# Patient Record
Sex: Female | Born: 1945 | Race: White | Hispanic: No | Smoking: Never smoker
Health system: Southern US, Community
[De-identification: ages and names within clinical notes are randomized; demographics above are authoritative.]

## PROBLEM LIST (undated history)

## (undated) ENCOUNTER — Emergency Department (HOSPITAL_COMMUNITY): Admission: EM | Payer: Self-pay | Source: Home / Self Care

## (undated) DIAGNOSIS — E119 Type 2 diabetes mellitus without complications: Secondary | ICD-10-CM

## (undated) DIAGNOSIS — G4730 Sleep apnea, unspecified: Secondary | ICD-10-CM

## (undated) DIAGNOSIS — F601 Schizoid personality disorder: Secondary | ICD-10-CM

## (undated) DIAGNOSIS — Z86010 Personal history of colonic polyps: Principal | ICD-10-CM

## (undated) DIAGNOSIS — Z8719 Personal history of other diseases of the digestive system: Secondary | ICD-10-CM

## (undated) DIAGNOSIS — R194 Change in bowel habit: Secondary | ICD-10-CM

## (undated) DIAGNOSIS — E785 Hyperlipidemia, unspecified: Secondary | ICD-10-CM

## (undated) DIAGNOSIS — I10 Essential (primary) hypertension: Secondary | ICD-10-CM

## (undated) DIAGNOSIS — M5412 Radiculopathy, cervical region: Secondary | ICD-10-CM

## (undated) DIAGNOSIS — F329 Major depressive disorder, single episode, unspecified: Secondary | ICD-10-CM

## (undated) DIAGNOSIS — C801 Malignant (primary) neoplasm, unspecified: Secondary | ICD-10-CM

## (undated) DIAGNOSIS — F32A Depression, unspecified: Secondary | ICD-10-CM

## (undated) DIAGNOSIS — N63 Unspecified lump in unspecified breast: Secondary | ICD-10-CM

## (undated) DIAGNOSIS — C4490 Unspecified malignant neoplasm of skin, unspecified: Secondary | ICD-10-CM

## (undated) HISTORY — DX: Sleep apnea, unspecified: G47.30

## (undated) HISTORY — DX: Unspecified malignant neoplasm of skin, unspecified: C44.90

## (undated) HISTORY — PX: BREAST SURGERY: SHX581

## (undated) HISTORY — DX: Change in bowel habit: R19.4

## (undated) HISTORY — DX: Unspecified lump in unspecified breast: N63.0

## (undated) HISTORY — DX: Schizoid personality disorder: F60.1

## (undated) HISTORY — DX: Major depressive disorder, single episode, unspecified: F32.9

## (undated) HISTORY — DX: Radiculopathy, cervical region: M54.12

## (undated) HISTORY — DX: Type 2 diabetes mellitus without complications: E11.9

## (undated) HISTORY — DX: Personal history of colonic polyps: Z86.010

## (undated) HISTORY — PX: ABDOMINAL HYSTERECTOMY: SHX81

## (undated) HISTORY — DX: Depression, unspecified: F32.A

## (undated) HISTORY — PX: TUBAL LIGATION: SHX77

## (undated) HISTORY — DX: Hyperlipidemia, unspecified: E78.5

## (undated) HISTORY — DX: Personal history of other diseases of the digestive system: Z87.19

## (undated) HISTORY — DX: Malignant (primary) neoplasm, unspecified: C80.1

## (undated) HISTORY — DX: Essential (primary) hypertension: I10

---

## 1997-12-04 ENCOUNTER — Other Ambulatory Visit: Admission: RE | Admit: 1997-12-04 | Discharge: 1997-12-04 | Payer: Self-pay | Admitting: Family Medicine

## 1998-07-16 ENCOUNTER — Ambulatory Visit (HOSPITAL_COMMUNITY): Admission: RE | Admit: 1998-07-16 | Discharge: 1998-07-16 | Payer: Self-pay | Admitting: Family Medicine

## 2000-08-25 ENCOUNTER — Ambulatory Visit (HOSPITAL_COMMUNITY): Admission: RE | Admit: 2000-08-25 | Discharge: 2000-08-25 | Payer: Self-pay | Admitting: Family Medicine

## 2001-05-10 ENCOUNTER — Inpatient Hospital Stay (HOSPITAL_COMMUNITY): Admission: EM | Admit: 2001-05-10 | Discharge: 2001-05-12 | Payer: Self-pay | Admitting: Emergency Medicine

## 2001-05-11 ENCOUNTER — Encounter: Payer: Self-pay | Admitting: Family Medicine

## 2001-05-15 ENCOUNTER — Encounter: Admission: RE | Admit: 2001-05-15 | Discharge: 2001-05-15 | Payer: Self-pay | Admitting: Family Medicine

## 2002-02-20 ENCOUNTER — Emergency Department (HOSPITAL_COMMUNITY): Admission: EM | Admit: 2002-02-20 | Discharge: 2002-02-20 | Payer: Self-pay | Admitting: Emergency Medicine

## 2002-02-20 ENCOUNTER — Encounter: Payer: Self-pay | Admitting: Emergency Medicine

## 2002-07-31 ENCOUNTER — Other Ambulatory Visit: Admission: RE | Admit: 2002-07-31 | Discharge: 2002-07-31 | Payer: Self-pay | Admitting: Family Medicine

## 2005-03-01 DIAGNOSIS — C801 Malignant (primary) neoplasm, unspecified: Secondary | ICD-10-CM

## 2005-03-01 HISTORY — DX: Malignant (primary) neoplasm, unspecified: C80.1

## 2005-11-15 ENCOUNTER — Ambulatory Visit: Payer: Self-pay | Admitting: Internal Medicine

## 2005-11-30 ENCOUNTER — Other Ambulatory Visit: Admission: RE | Admit: 2005-11-30 | Discharge: 2005-11-30 | Payer: Self-pay | Admitting: Family Medicine

## 2005-12-03 ENCOUNTER — Ambulatory Visit: Payer: Self-pay | Admitting: Internal Medicine

## 2006-01-12 ENCOUNTER — Ambulatory Visit: Admission: RE | Admit: 2006-01-12 | Discharge: 2006-01-12 | Payer: Self-pay | Admitting: Gynecologic Oncology

## 2006-02-01 ENCOUNTER — Encounter (INDEPENDENT_AMBULATORY_CARE_PROVIDER_SITE_OTHER): Payer: Self-pay | Admitting: *Deleted

## 2006-02-01 ENCOUNTER — Observation Stay (HOSPITAL_COMMUNITY): Admission: RE | Admit: 2006-02-01 | Discharge: 2006-02-03 | Payer: Self-pay | Admitting: Obstetrics & Gynecology

## 2006-02-16 ENCOUNTER — Ambulatory Visit: Admission: RE | Admit: 2006-02-16 | Discharge: 2006-02-16 | Payer: Self-pay | Admitting: Gynecologic Oncology

## 2006-06-15 ENCOUNTER — Ambulatory Visit: Admission: RE | Admit: 2006-06-15 | Discharge: 2006-06-15 | Payer: Self-pay | Admitting: Gynecologic Oncology

## 2006-06-15 ENCOUNTER — Encounter (INDEPENDENT_AMBULATORY_CARE_PROVIDER_SITE_OTHER): Payer: Self-pay | Admitting: *Deleted

## 2006-06-15 ENCOUNTER — Other Ambulatory Visit: Admission: RE | Admit: 2006-06-15 | Discharge: 2006-06-15 | Payer: Self-pay | Admitting: Gynecologic Oncology

## 2007-08-16 ENCOUNTER — Ambulatory Visit: Payer: Self-pay | Admitting: Cardiology

## 2007-11-28 ENCOUNTER — Ambulatory Visit: Admission: RE | Admit: 2007-11-28 | Discharge: 2007-11-28 | Payer: Self-pay | Admitting: Gynecologic Oncology

## 2007-11-28 ENCOUNTER — Encounter: Payer: Self-pay | Admitting: Gynecologic Oncology

## 2007-11-28 ENCOUNTER — Other Ambulatory Visit: Admission: RE | Admit: 2007-11-28 | Discharge: 2007-11-28 | Payer: Self-pay | Admitting: Gynecologic Oncology

## 2008-05-25 DIAGNOSIS — E1169 Type 2 diabetes mellitus with other specified complication: Secondary | ICD-10-CM | POA: Insufficient documentation

## 2008-05-25 DIAGNOSIS — G4733 Obstructive sleep apnea (adult) (pediatric): Secondary | ICD-10-CM | POA: Insufficient documentation

## 2008-05-25 DIAGNOSIS — I10 Essential (primary) hypertension: Secondary | ICD-10-CM | POA: Insufficient documentation

## 2008-05-25 DIAGNOSIS — C549 Malignant neoplasm of corpus uteri, unspecified: Secondary | ICD-10-CM | POA: Insufficient documentation

## 2008-05-25 DIAGNOSIS — E785 Hyperlipidemia, unspecified: Secondary | ICD-10-CM

## 2008-05-25 DIAGNOSIS — G4730 Sleep apnea, unspecified: Secondary | ICD-10-CM

## 2008-08-03 ENCOUNTER — Encounter: Admission: RE | Admit: 2008-08-03 | Discharge: 2008-08-03 | Payer: Self-pay | Admitting: Neurology

## 2008-12-22 ENCOUNTER — Encounter: Admission: RE | Admit: 2008-12-22 | Discharge: 2008-12-22 | Payer: Self-pay | Admitting: Neurology

## 2009-10-21 ENCOUNTER — Telehealth (INDEPENDENT_AMBULATORY_CARE_PROVIDER_SITE_OTHER): Payer: Self-pay | Admitting: *Deleted

## 2010-03-22 ENCOUNTER — Encounter: Payer: Self-pay | Admitting: Neurology

## 2010-03-23 ENCOUNTER — Encounter: Payer: Self-pay | Admitting: Neurology

## 2010-04-02 NOTE — Progress Notes (Signed)
   DDS request recieved sent to [facility]. Alexandra Hunt  October 21, 2009 12:52 PM

## 2010-07-14 NOTE — Assessment & Plan Note (Signed)
[facility]                            CARDIOLOGY OFFICE NOTE   Alexandra Hunt, Alexandra Hunt                        MRN:          [id number]  DATE:08/16/2007                            DOB:          05/11/1945    PRIMARY CARE PHYSICIAN:  Alexandra Pierini, NP, [facility].   REASON FOR PRESENTATION:  Evaluate the patient with shortness of breath,  chest discomfort, and multiple cardiovascular risk factors.   HISTORY OF PRESENT ILLNESS:  The patient is 65 years old.  She has had  chest discomfort in the past with a stress test in 1993 and again in  2000.  These were exercise treadmill tests and did not demonstrate any  high-grade obstructive coronary artery disease.  However, since I last  saw her about 9 years ago, she has been diagnosed with hypertension,  diabetes, and hyperlipidemia.  She has hemoglobin A1c last checked at  7.5.  She is on treatment for her dyslipidemia.   She states she has been getting more short of breath.  This has been  happening slowly over the last year or so.  It has been dyspnea walking  up a hill.  She is not describing resting shortness of breath and is not  having any PND or orthopnea.  She has not been having any palpitations,  presyncope, or syncope.  She has been getting chest discomfort.  This  happens at rest.  It happens with emotional stress.  It is a shooting  discomfort, but it may linger.  It is on her left side and radiating up  to her left arm.  She may take a Ativan and it might go away.  It maybe  5/10 in intensity.  She is not sure whether this is similar to the  previous discomfort she has.  She states she cannot really bring this  on, except with the emotional stress.  She has not had associated  nausea, vomiting, or diaphoresis with this.  She states she is under  lots of stress taking care of 3 grandchildren at home.  Her daughter is  missing.   PAST MEDICAL HISTORY:  1.  Diabetes mellitus x7 years.  2. Hypertension x2 years.  3. Hyperlipidemia x2 years.  4. Sleep apnea (she was not prescribed CPAP).  5. Endometrial cancer, 2007.   PAST SURGICAL HISTORY:  1. Hysterectomy/BSO/lymphadenectomy.  2. Breast biopsy x2.   ALLERGIES:  1. PENICILLIN.  2. SULFA.  3. CODEINE.   MEDICATIONS:  1. Metformin 1000 mg b.i.d.  2. Sertraline 75 mg daily.  3. Maxzide 37.5/25 daily.  4. Fenofibrate 34 mg daily.  5. Aspirin 81 mg daily.  6. Loratadine 10 mg daily.  7. Xanax.  8. Ativan 1 mg nightly.  9. Vicodin.  10.Humalog.  11.Lantus.  12.Lisinopril.   SOCIAL HISTORY:  The patient is a Engineer, civil (consulting).  She is married.  She has 4  children.  She is currently caring for her daughter's 38-year-old twins  and 53-year-old child.  She does not smoke cigarettes.  She does not  drink alcohol.  FAMILY HISTORY:  Contributory for father dying of myocardial function at  age 22.   REVIEW OF SYSTEMS:  As stated in the HPI, and positive for dizziness,  some joint pain, and lower extremity swelling.  Negative for all other  systems.   PHYSICAL EXAMINATION:  GENERAL:  The patient is in no acute distress.  VITAL SIGNS:  Blood pressure 170/80, rate 84 and regular, body mass  index 34, and weight 200 pounds.  HEENT:  Eyelids are unremarkable.  Pupils equal, round, and reactive to  light.  Fundi not visualized.  Oral mucosa unremarkable.  NECK:  No jugular distention at 45 degrees, carotid upstroke brisk and  symmetric.  No bruits, no thyromegaly.  LYMPHATICS:  No cervical, axillary, or inguinal adenopathy.  LUNGS:  Clear to auscultation bilaterally.  BACK:  No costovertebral angle tenderness.  CHEST:  Unremarkable.  HEART:  PMI not displaced or sustained.  S1 and S2 within normal limits.  No S3, no S4, no clicks, no rubs, no murmurs.  ABDOMEN:  Obese, positive bowel sounds normal in frequency and pitch, no  bruits, no rebound, no guarding, no midline pulsatile mass, no   hepatomegaly, no splenomegaly.  SKIN:  No rashes, no nodules.  EXTREMITIES:  2+ pulses throughout, no edema, no cyanosis, no clubbing.  NEUROLOGIC:  Oriented to person, place, and time.  Cranial nerves II-XII  grossly intact, motor grossly intact.   EKG, sinus rhythm, rate 84, axis within normal limits, intervals within  normal limits, no acute ST-wave changes.   ASSESSMENT/PLAN:  1. Chest pain. The patient has chest discomfort and dyspnea.  The      pretest probability of obstructive coronary disease is at least      moderate given her risk factors.  Therefore, I am going to screen      her with an exercise perfusion study.  If she cannot walk on a      treadmill, this can be converted to an adenosine Cardiolite.  I      will also check a BNP level.  If both the stress test and the BNP      level come back normal, then the etiology of the dyspnea is much      less likely to be cardiac.  2. Hypertension.  Blood pressure is elevated.  I am going to take the      liberty of starting lisinopril 10 mg daily.  She was on this in the      past and did not have any problems with it.  She will get a BMET in      2 weeks.  3. Dyslipidemia, per Alexandra Hunt with a goal LDL of less than 100      and HDL greater than 50.  I will revise this based on the presence      or absence of coronary artery disease.  4. Diabetes mellitus.  She is having this followed closely.  5. Sleep apnea.  The patient should inquire as to whether she had this      severely enough that CPAP would be warranted.  Management of sleep      apnea can lead to improved blood pressure control.  6. Obesity.  The patient will understand the need to lose weight with      diet and exercise.  7. Followup.  I will see her again in about 3-4 months to follow up      her multiple risk factors and dyspnea.  Alexandra Rotunda, MD, [facility]  Electronically Signed    JH/MedQ  DD: 08/16/2007  DT: 08/17/2007  Job #: [id number]   cc:   Alexandra Hunt, [place].P.
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
## 2010-07-17 NOTE — Discharge Summary (Signed)
NAMEWANDRA, Alexandra Hunt                 ACCOUNT NO.:  [id number]   MEDICAL RECORD NO.:  [id number]          PATIENT TYPE:  INP   LOCATION:  1608                         FACILITY:  [facility]   PHYSICIAN:  Freddy Finner, M.D.   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   ADDITIONAL MEDICAL DIAGNOSES:  1. Diabetes.  2. Depression.  3. Hypercholesterolemia.   OPERATIVE PROCEDURE:  Total laparoscopic hysterectomy, pelvic lymph node  dissection by Dr. Ronita Hipps with my assistance.   INTRAOPERATIVE AND POSTOPERATIVE COMPLICATIONS:  None.   DISPOSITION:  The patient was in satisfactory improved condition at the  time of her discharge on the second postoperative day.  She was  instructed to have progressively increasing physical activity but no  vaginal entry.  No heavy lifting.  She is to report heavy vaginal  bleeding, severe pain, or fever.  She is to resume all of her  preoperative medications which are outlined in the consultation note of  Dr. Duard Brady dated January 12, 2006.  She was given Dilaudid 2 mg tablets  #60 to be taken 1-2 q.4-6h. as needed for postoperative pain.  This can  be taken in conjunction with ibuprofen.  She is to follow up in my  office in approximately 2 weeks.  She also will have followup as  outlined per Dr. Calton Dach instructions.   Details of the present illness, past history, family history, review of  systems and physical exam were recorded in my admission note and/or in  Dr. Denman George consultation note.  Her admission findings were primarily  remarkable for histologic diagnosis of endometrioid carcinoma made  during hysteroscopy D&C for an episode of postmenopausal bleeding.   HOSPITAL COURSE:  The patient was admitted on that morning of surgery.  She was  treated perioperatively with IV antibiotic and with serial  compression hose for her lower extremities, the above-described  operative procedure was accomplished on the morning of surgery.  Her  postoperative course was uncomplicated.  Her postoperative hemoglobin  was 10.8 with admission hemoglobin of 13.1.  Postoperative CMET was  essentially normal as was the admission data.  Admission A1c was 10.9.  Admission urinalysis was normal.  The patient's postoperative course  progressed well.  She remained afebrile throughout her hospital stay.  By the morning of the second postoperative day, she was having adequate  bowel and bladder function.  She was tolerating her diabetic diet.  She  was discharged home with disposition as noted above.      Freddy Finner, M.D.  Electronically Signed     WRN/MEDQ  D:  05/13/2006  T:  05/14/2006  Job:  [id number]

## 2010-07-17 NOTE — Consult Note (Signed)
Alexandra Hunt, Alexandra Hunt                 ACCOUNT NO.:  [id number]   MEDICAL RECORD NO.:  [id number]          PATIENT TYPE:  OUT   LOCATION:  GYN                          [facility]ACILITY:  [facility]   PHYSICIAN:  Alexandra A. Duard Brady, MD    DATE OF BIRTH:  1945-05-29   DATE OF CONSULTATION:  01/12/2006  DATE OF DISCHARGE:                                 CONSULTATION   REFERRING PHYSICIAN:  Dr. Varney Hunt.   The patient is seen today in consultation at the request of Alexandra Hunt.   Alexandra Hunt is a 65 year old, gravida 3, para 4, whose last period was  about  4-5 months ago.  She states that she has some occasional spotting  for the past 6 months, and she has had what appears to be two periods,  and then started having some __________  .  She was seen by her primary  physician, Alexandra Hunt, nurse practitioner at [facility] in October 2007.  At that time, she had a Pap smear that  revealed atypical glandular cells.  Her visit with them was November 30, 2005.  She was promptly referred to Alexandra Hunt and was seen by Alexandra Hunt on  October 25th.  At that time, she had a 0.5-cm endometrial stripe  thickness with a 9-mm intermural fibroid.  Post saline hystero-sonogram  revealed a 6-mm polypoid mass seen within the endometrial cavity.  She  subsequently went a hysteroscopy D&C.  Final pathology from her D&C was  consistent with an endometrial carcinoma, though no grade was provided.  KI67 and P53 were performed that was negative for a serous carcinoma.  It was most consistent with an endometrioid adenocarcinoma, ER/PR  positive.  It is for this reason that she is referred to [place] today.   She states since her D&C, she has had decreased bleeding though she does  have occasional spotting.  She denies any change in about bladder  habits.  She denies any chest pain.  She does occasionally have some  shortness of breath with activity.  She has shortness of breath going up  a flight of stairs.   PAST  MEDICAL HISTORY:  1. Diabetes.  She is on insulin and oral hypoglycemics.  2. Depression.  3. Hypercholesterolemia.   PAST SURGICAL HISTORY:  1. D&C.  2. Tubal ligation.  3. Breast biopsy of the left breast in 1986, which was benign.  4. Cryotherapy in 1986.  5. She has had four spontaneous vaginal deliveries.   MEDICATIONS:  1. Zoloft 50 mg daily.  2. TriCor 145 mg daily.  3. Insulin 50 units of Lantus q.h.s.  4. NovoLog sliding scale.  5. Metformin 1000 mg twice daily.  6. Avandia 4 mg daily.  7. Hydrochlorothiazide 12.5 mg daily.  8. Multivitamin.  9. Baby aspirin #2 daily.  10.Ativan p.r.n.  11.Darvocet p.r.n.  12.Xanax p.r.n.   ALLERGIES:  1. SULFA WHICH CAUSES NAUSEA AND HIVES.  2. PENICILLIN WHICH CAUSES HIVES.  3. CODEINE WHICH CAUSES NAUSEA.  4. IVP DYE MAKES HER HOT.  5. SHE IS ALLERGIC TO LATEX.  SOCIAL HISTORY:  She denies using tobacco or alcohol.  She is married.  She works as a Engineer, civil (consulting).  Her husband is an Personnel officer.   FAMILY HISTORY:  Her mother had diabetes, coronary disease.  Father died  of a myocardial infarction at the age of 31.  She has two paternal  grandparents who died of MIs.  Maternal grandmother with diabetes.  Paternal grandfather died of old age.   HEALTH MAINTENANCE:  Her colonoscopy was negative in October 2007.  She  had a mammogram in 2007, that was negative.  Her last A1c was 8.6.   PHYSICAL EXAMINATION:  VITAL SIGNS:  Height 5 feet 4 inches, weight 178  pounds, pulse 80, respirations 20.  GENERAL:  A well-nourished, well developed, female in no acute distress.  NECK:  Supple.  There is no lymphadenopathy, no thyromegaly.  LUNGS:  Clear to auscultation bilaterally.  CARDIOVASCULAR:  Regular rate and rhythm.  ABDOMEN:  Soft, nontender, nondistended.  There are no palpable masses  or hepatosplenomegaly.  Groins are negative for adenopathy.  EXTREMITIES:  There is no edema.  PELVIC:  External genitalia is within normal limits.  The  vagina is  atrophic.  The cervix is multiparous.  There is a physiologic discharge.  No visible lesions.  Bimanual examination, the corpus of normal size,  shape and consistency.  There are no adnexal masses.  Rectal confirms.   ASSESSMENT:  A 65 year old with endometrial carcinoma.  The grade was  not described by pathologist, but appears to be endometrioid.  There are  no atypical features.   I did discuss with her surgery including hysterectomy, BSO and  appropriate staging.  I wish that I had a grade so I could discuss with  her more percentages but she is amenable to surgery.  She would like to  proceed with laparoscopy if at all possible and we discussed the date of  December 4 with Dr. Kyla Hunt and Alexandra Hunt.  She is amenable to this.   She would like to be out of work from December 3 for 8 weeks after her  surgery and has brought paperwork to that effect.  I did discuss that we  would attempt it laparoscopically, consisting of a total laparoscopic  hysterectomy, BSO and appropriate staging.  If it is not able to be  performed laparoscopically, she knows that she may have a vertical  midline incision and laparotomy.  Her questions regarding this were  elicited and answered to her satisfaction.  She knows that the surgery  will be with Dr. Kyla Hunt and has asked that I asked him to call her.  We  will also follow up on the grading. We thought she would like Alexandra Hunt to call her with the grade of her tumor for her own  edification.  Risks of surgery including bleeding, infection, injury to  surrounding organs were discussed.  She knows that she is at high risk  for wound complications secondary to her poorly controlled diabetes and  we would encourage her to see her physician for optimal diabetes  management prior to surgery.  In addition, I think that we would need to  ensure that there are no significant perioperative risks and that she should undergo a perioperative risk  stratification.  We will contact her  primary physicians, Alexandra Hunt, nurse practitioner at [facility] for diabetic recommendations and  perioperative diabetes control as well as cardiac risk stratification.  She also has seen Dr. Antoine Poche  at [facility] cardiology.      Alexandra A. Duard Brady, MD  Electronically Signed     PAG/MEDQ  D:  01/12/2006  T:  01/12/2006  Job:  [id number]   cc:   Alexandra Floor, NP  [facility]   Rollene Rotunda, MD, [facility]  [address]  [address]  [place]  [place] [address]   W. Varney Hunt, M.D.  Fax: [phone]   Alexandra Hunt, R.N.  [address]  [place], [place] [address]

## 2010-07-17 NOTE — Discharge Summary (Signed)
[place]. [facility]  [name], Alexandra Hunt Visit Number: [id number] MRN: [id number]          Service Type: MED Location: [phone] Attending Physician:  McDiarmid, Leighton Roach. Dictated by:   Asencion Partridge Neva Seat, M.D. Admit Date:  05/10/2001 Discharge Date: 05/12/2001   CC:         Gweneth Dimitri, M.D.   Discharge Summary  ADMISSION DIAGNOSES: 1. Viral illness. 2. Hypertension. 3. Diabetes mellitus, type 2, poor control. 4. Gastroesophageal reflux disease. 5. Multiple social issues.  DISCHARGE DIAGNOSES: 1. Influenza A infection. 2. Diabetes mellitus, type 2, poor control secondary to noncompliance. 3. Hypertension. 4. Gastroesophageal reflux disease. 5. Social stressors.  ADMISSION HISTORY AND PHYSICAL:  The patient is a 65 year old female with history of diabetes mellitus and hypertension developed fever, shaking chills, and myalgias three days prior to admission.  She has continued to work.  She presented to her primary physician on the day of admission and was admitted secondary to history of poor compliance, often noted to have elevated CBGs and poor social support at that time.  The patient has CBG of 363 and 4+ urine ketones at the office.  The patient was admitted to [facility] where she was placed on normal saline bolus x2 L and then maintenance IV fluid and also started on Tamiflu 75 mg p.o. b.i.d. and restarted on her home medications including Actos 15 mg p.o. q.h.s. and Amaryl 4 mg p.o. q.a.m. A chest x-ray was obtained which showed some slight peribronchial thickening otherwise no acute disease.  Blood cultures were negative throughout the hospitalization.  Influenza titer was positive for Influenza A.  CBC, white blood cells 5.6, hemoglobin 13.1, platelets 222, BMP was within normal limits with exception of glucose of 375.  HOSPITAL COURSE: #1 - INFLUENZA:  The patient was diagnosed with Influenza A and  was treated symptomatology with IV fluids.  Tamiflu was started, however, as the patient was a few days into the illness, we were already unsure as to the effectiveness of this medication during the hospitalization, but it was started for possible symptomatic control.  The patient was tolerating p.o. prior to discharge and symptomatically had improved.  #2 - DIABETES MELLITUS:  The patient was noted to be under poor glycemic control secondary to noncompliance and a history of only checking her blood sugars once a week.  Hemoglobin A1c was obtained at 13.3.  Her regimen was changed.  Amaryl was increased to the maximum dose of 8 mg p.o. q.d., continued Actos.  She was on an insulin sliding scale in the hospital. Diabetic coordinator was consulted.  The patient was arranged to follow up with the outpatient diabetes educator at [facility] for one-to-one therapy to address the patients unique lifestyle shift work.  Information on this counseling was to be sent to the patient for scheduling.  No further changes to the regimen as the patient was to follow up with primary care medical doctor in approximately two weeks and may adjust pharmacologic regimen for diabetes at that time.  #3 - HYPERTENSION:  Remained stable throughout hospitalization on Prinivil. Discharged to home on home dose.  #4 - GASTROESOPHAGEAL REFLUX DISEASE:  She denied being on medicines when she presented to the hospital.  She was given Pepcid while in the hospital for prevention of gastroesophageal reflux.  She denied any symptoms.  She was discharged to home on no pharmacologic regimen.  #5 - SOCIAL STRESSORS:  The patient states that she has many stressors in her life currently.  She has multiple family members who still live at home and feels that she is taking care of many others and has a hard time taking care of herself.  She also states that this is some of the reason why she has a hard time managing her  diabetes, keeping that under control.  I had a lengthy discussion with the patient about the stressors in her life and counseled possible management of these and realizing that the first step in helping others was to help herself first.  The patient expressed understanding of this and does realize that she does need to pass some responsibility onto others and plans on attempting that and does reply that this would be a good time to start that as currently is ill and she states that her husband will be off of work for the next few days and will be able to assist her and help take care of her while she gets over this current sickness.  The patient was discharged to home on May 12, 2001, in stable condition.  DISCHARGE MEDICATIONS: 1. Amaryl 4 mg 2 tablets p.o. q.d. with meal. 2. Actos 15 mg tablet 1 tablet p.o. q.h.s. 3. Albuterol inhaler 2 puffs p.o. q.4h. p.r.n. wheezing and shortness of    breath as the patient had some wheezing associated with her insulin in    the hospital. 4. The patient was to continue her Flonase, multivitamin, aspirin, and    Serzone at her prehospital admission doses.  ACTIVITY:  No restrictions.  A note was given to the patient to excuse her from work while she recuperates from this illness.  DIET:  The patient was able to adhere to a diabetic diet as prescribed by the American Diabetes Association.  WOUND CARE:  Not applicable.  SPECIAL INSTRUCTIONS:  The patient is to expect to receive a telephone call from the [facility] to arrange one-on-one diabetes education at [facility].  This is secondary to the patients third shift work and special circumstances.  Also, schedule an appointment with the patients primary physician, Dr. Gweneth Dimitri, on Friday, March 28, at 2:45 p.m. Dictated by:   Asencion Partridge Neva Seat, M.D. Attending Physician:  McDiarmid, Tawanna Cooler D. DD:  05/12/01 TD:  05/15/01 Job: [id number]

## 2010-07-17 NOTE — H&P (Signed)
[place]. [facility]  [name], Alexandra Hunt Visit Number: [id number] MRN: [id number]          Service Type: MED Location: [phone] Attending Physician:  McDiarmid, Leighton Roach. Dictated by:   Mont Dutton, M.D. Admit Date:  05/10/2001                           History and Physical  DATE OF BIRTH:  [birth year]  SERVICE:  [facility].  PRIMARY CARE PHYSICIAN:  [facility].  CHIEF COMPLAINT:  "Feel lousy."  HISTORY OF PRESENT ILLNESS:  Patient is a pleasant 65 year old female with a history of diabetes mellitus and hypertension, who developed fever, shaking chills and myalgias three days ago.  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  She reports severe body aches "all over," worse now than earlier.  Some nausea reported but no vomiting except for phlegm.  Positive productive cough, intermittent, with chest congestion intermittently.  Her blood sugars are greater than 300 over the past few days and she is urinating two to three times every hour.  She was evaluated by Dr. Gweneth Dimitri at [facility] today, who is her primary physician, who felt that due to patients poor compliance, elevated CBGs and poor social support, that she would not be safe at home. Patient was too tired to go fill a prescription for Tamiflu.  She was noted to have CBGs of 363 and positive urine ketones today.  Patient only checks her CBG q.wk. at work and was started on Actos two weeks ago.  PAST MEDICAL HISTORY: 1. Diabetes mellitus, type 2, x4 years. 2. Hypertension. 3. GERD, intermittent. 4. Situational stress. 5. DUB. 6. Questionable OSA. 7. Rhinitis.  PAST SURGICAL HISTORY: 1. Bilateral tubal ligation in 1978. 2. Lumpectomy of left breast in 1988. 3. Dysplasia of cervix which proceeded to cryosurgery in 1988. 4. Endocervical  polypectomy.  MEDICATIONS: 1. Amaryl 4 mg p.o. q.d. 2. Flonase two puffs b.i.d. 3. Actos 50 mg p.o. q.d. 4. Prinivil 2.5 mg p.o. q.d. 5. MVI q.d. 6. ASA q.d. 7. Serzone 150 mg p.o. b.i.d.  ALLERGIES:  CODEINE, SULFA, PENICILLIN and IV CONTRAST.  SOCIAL HISTORY:  Patient lives with her husband.  There is positive marital dysfunction; patient does not feel supported at home (per Dr. Corliss Blacker). Denies alcohol, tobacco or illicit drug use.  FAMILY HISTORY:  Positive for diabetes mellitus.  REVIEW OF SYSTEMS:  Positive fevers, sweating, weight loss, anorexia, myalgias and nausea.  Denies any chest pain, palpitations or dyspnea.  Denies diarrhea, constipation or melena.  Denies joint pain.  PHYSICAL EXAMINATION:  VITALS:  Temperature 101.7, heart rate 106, respiratory rate 18, blood pressure 113/61.  GENERAL:  Pleasant, A&O x3, slightly obese and ill-appearing but in no acute distress.  HEENT:  TMs clear.  PERRL.  EOMI.  Nares nonedematous.  Oropharynx:  Slightly dry mucosa and poor dentition.  NECK:  No lymphadenopathy.  CHEST:  Clear to auscultation bilaterally.  No wheezes.  CARDIOVASCULAR:  Tachycardia but regular rhythm.  No murmur.  ABDOMEN:  Increased bowel sounds but nondistended, nontender.  No guarding or rebound.  No hepatosplenomegaly.  EXTREMITIES:  Positive for diaphoresis and clammy but brisk capillary refill.  LABORATORY AND ACCESSORY DATA:  Labs are pending.  White blood cell count at [facility]  6.7.  Urine with 4+ ketones with specific gravity of 1.020.  These labs were from [facility].  ASSESSMENT AND PLAN:  [age]-year-old white female with a viral illness, mild dehydration, hyperglycemia and poorly controlled diabetes mellitus.  1. Infectious disease:  Likely viral origin or flu but will check blood    culture, complete blood count and chest x-ray to rule out occult infection.    Hold off  on antibiotic treatment for now.  Will give Tamiflu.  Likely    little benefit at slightly greater than at 48 hours but may help    symptomatic complaints. 2. Hypertension:  Continue Prinivil. 3. Fluids, electrolytes, and nutrition:  We will continue aggressive    intravenous fluids and then above maintenance for hyperglycemia.  Check    electrolytes and replace as appropriate. 4. Diabetes mellitus, poorly controlled.  Her current regimen is likely not to    lower her A1c to target range.  If creatinine is within normal limits, will    advise Glucophage in addition to ______.  May need daily Lantus    eventually.  Also will need diabetic teaching.  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 5. Gastroesophageal reflux disease:  Pepcid intravenously for now. 6. Social:  Address as outpatient, patient not cared for at home and this    admission is partly to assist in proper care while here.  Will follow up    with patients primary physician, Dr. Corliss Blacker. Dictated by:   Mont Dutton, M.D. Attending Physician:  McDiarmid, Tawanna Cooler D. DD:  05/10/01 TD:  05/11/01 Job: [id number]

## 2010-07-17 NOTE — H&P (Signed)
Alexandra Hunt, Alexandra Hunt                 ACCOUNT NO.:  [id number]   MEDICAL RECORD NO.:  [id number]          PATIENT TYPE:  INP   LOCATION:  NA                           FACILITY:  [facility]   PHYSICIAN:  Freddy Finner, M.D.   DATE OF BIRTH:  05/19/1945   DATE OF ADMISSION:  02/01/2006  DATE OF DISCHARGE:                              HISTORY & PHYSICAL   ADMITTING DIAGNOSIS:  Endometrioid carcinoma of the endometrium, FIGO  grade 1.   The patient is a 65 year old, who was seen in consultation by Dr. Cleda Mccreedy, on January 12, 2006, after an outpatient hysteroscopy D&C  revealed a diagnosis of endometrial cancer.  Dr. Denman George note is  complete including present illness, past history, family history, and  physical examination.  The patient was in my office on November 29, at  which time her physical findings were unchanged from Dr. Denman George note.  Since the details of that note are complete, I will not repeat all of  those things since I know the consultation record will be part of her  permanent record.   PHYSICAL EXAMINATION:  To my examination on the 29th, HEENT:  Grossly  within normal limits.  Thyroid gland is not palpably enlarged.  CHEST:  Clear to auscultation.  HEART:  Normal sinus rhythm without murmurs, rubs, or gallops.  ABDOMEN:  Soft and nontender without appreciable organomegaly or  palpable masses.  BREAST EXAM:  Considered to be normal.  There are no palpable masses.  No skin change.  No evidence of nipple discharge.  EXTREMITIES:  Without cyanosis, clubbing, or edema.  PELVIC EXAMINATION:  External genitalia and vagina and cervix are  grossly normal and somewhat atrophic.  Bimanual reveals no palpable  enlargement of the uterus, no palpable adnexal masses.  The rectum is  palpably normal.  Rectovaginal exam confirms the above findings.   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   PLAN:  Laparoscopic total  hysterectomy, bilateral salpingo-oophorectomy  and appropriate staging to be performed by Dr. Ronita Hipps with my  assistance.      Freddy Finner, M.D.  Electronically Signed     WRN/MEDQ  D:  01/31/2006  T:  01/31/2006  Job:  [id number]

## 2010-07-17 NOTE — Consult Note (Signed)
NAMEJONATHON, Alexandra Hunt                 ACCOUNT NO.:  [id number]   MEDICAL RECORD NO.:  [id number]          PATIENT TYPE:  LOUT   LOCATION:                               FACILITY:  [facility]   PHYSICIAN:  John T. Kyla Balzarine, M.D.    DATE OF BIRTH:  1945/12/18   DATE OF CONSULTATION:  06/15/2006  DATE OF DISCHARGE:                                 CONSULTATION   CHIEF COMPLAINT:  Follow-up of endometrial cancer.   HISTORY OF PRESENT ILLNESS:  This 65 year old woman presented with  menorrhagia and was found to have a grade 1 endometrial adenocarcinoma.  On February 01, 2006, she underwent [place] TLH, BSO, bilateral  lymphadenectomy and abdominal washings with final pathology consistent  with a stage 1-B. grade 1 endometrioid adenocarcinoma with invasion of  approximately the inner third of the myometrium.  Lymph nodes were  negative, and it was all elected to follow her without treatment.  The  patient has done well physically since, having returned to full  activity.  She does have some urgency and stress incontinence and wears  a pad which she changes once daily.  Bowel and bladder functions are  normal.  She denies pelvic pain, dyspareunia or vaginal bleeding.  She  has occasional ankle edema, although she stands for her job.   PAST MEDICAL HISTORY:  1. Insulin-dependent diabetes.  2. Depression.  3. Hypercholesterolemia.  4. Status post D&C.  5. Tubal ligation.  6. Benign breast biopsies.  7. NSVD x4.  8. Endometrial cancer surgery as above.   MEDICATIONS:  Zoloft, Tricor, Lantus insulin q.h.s. and NovoLog sliding  scale, metformin twice daily, at the [employer], hydrochlorothiazide, and  multivitamins.  Baby aspirin, Ativan p.r.n. and Xanax p.r.n.   ALLERGIES:  SULFA, PENICILLIN, CODEINE, IVP DYE and LATEX.   PERSONAL AND SOCIAL HISTORY:  The patient is married and a Engineer, civil (consulting), denies  tobacco or ethanol.   FAMILY HISTORY:  Positive for diabetes and coronary artery disease.  No  known breast  or gynecologic malignancies.   REVIEW OF SYSTEMS:  Other than noted above, negative in a comprehensive  10-point review.   PHYSICAL EXAMINATION:  VITAL SIGNS:  Weight 186 pounds, blood pressure  146/78.  GENERAL:  The patient is alert and oriented x3, in no acute distress.  LYMPHATIC:  No pathologic lymphadenopathy.  BACK:  No back or CVA tenderness.  ABDOMEN:  Obese, soft and benign, with no ascites, mass, hernia, or  organomegaly.  No abdominal tenderness.  EXTREMITIES:  Full strength and range of motion without edema, cords or  Homan's.  PELVIC:  External genitalia and BUS normal.  Vagina is clear with normal  bladder and urethra, with small cystocele.  Bimanual and rectovaginal  examinations reveal no mass or nodularity, absent uterus and cervix.   ASSESSMENT:  Endometrial cancer, NAD.   PLAN:  The patient is reassured regarding her current status.  She is at  a low risk for recurrence and has a routine appointment with Dr. Jennette Kettle in  approximately 4-6 months.  We could see her back for follow-up at 4-6  months  after her next evaluation and then she could be seen at 91-month  intervals, alternating with Dr. Jennette Kettle.  Cytology obtained today will be  communicated to the patient.      John T. Kyla Balzarine, M.D.  Electronically Signed     JTS/MEDQ  D:  06/15/2006  T:  06/15/2006  Job:  [id number]   cc:   Telford Nab, R.N.  [address]  [place], [place] [address]   W. Varney Baas, M.D.  Fax: [phone]   Rollene Rotunda, MD, [facility]  [address]  [address]  [place]  [place] [address]   [name], [place].P.

## 2010-07-17 NOTE — Op Note (Signed)
Alexandra Hunt, Alexandra Hunt                 ACCOUNT NO.:  [id number]   MEDICAL RECORD NO.:  [id number]          PATIENT TYPE:  INP   LOCATION:  1608                         FACILITY:  [facility]   PHYSICIAN:  John T. Kyla Balzarine, M.D.    DATE OF BIRTH:  07/13/1945   DATE OF PROCEDURE:  02/01/2006  DATE OF DISCHARGE:                               OPERATIVE REPORT   SURGEON:  John T. Kyla Balzarine, M.D.   ASSISTANT:  Freddy Finner, M.D.   PREOPERATIVE DIAGNOSIS:  Endometrial cancer.   POSTOPERATIVE DIAGNOSIS:  Likely stage IB grade 1 endometrial carcinoma.   PROCEDURE:  1. Total laparoscopic hysterectomy with bilateral salpingo-      oophorectomy.  2. Bilateral pelvic lymphadenectomy.  3. Peritoneal washings.   ANESTHESIA:  General endotracheal.   DESCRIPTION OF FINDINGS AND THE INDICATIONS FOR SURGERY:  This 65-year-  old woman had a low-grade endometrial adenocarcinoma diagnosed by D&C.  On exam under anesthesia, she had an upper limits sized uterus and  captious upper vagina.  On laparoscopy, there was no evidence of  carcinomatosis.  There were no pathologically enlarged lymph nodes.  Adhesions were present between the distal right tube and cecum, and left  tube and sigmoid colon, in addition to omental adhesions to the anterior  abdominal wall in the right midabdomen.  Frozen sections subsequently  returned superficially invasive low-grade endometrial adenocarcinoma  with a 2-cm fundal lesion.  For this reason, and because of difficulties  with exposure, aortic node dissection was not performed.   DESCRIPTION OF PROCEDURE:  The patient was prepped and draped in the low  lithotomy position using direct placement stirrups.  The cervix was  dilated, under direct vision, uterus sounded to 8 cm and the  HUMI  manipulator placed, with a medium large co-ring.  A supraumbilical, two  lateral 5-mm ports, and a suprapubic 10/11 mm ports were used for  laparoscopic access.  The periumbilical port was a  10/11 OptiVu, placed  under direct visualization.  All port sites were injected with 1/4%  Marcaine prior to trocar insertion.  Once the camera and manipulators  had been placed, abdominal pelvic surveillance revealed the findings  described above.   The patient was pre placed in steep Trendelenburg position and  peritoneal washings were obtained from the pelvis.  The round ligaments  bilaterally were divided with the harmonic scalpel and adhesions between  the distal fallopian tubes and bowel taken down.  Starting first on the  right side and then the left side, systematic pelvic lymphadenectomies  were performed.  The medial leaf of the broad ligament on each side was  opened lateral to the infundibulopelvic ligament with the incision  extending just lateral to the superior vesical artery on each side.  Pelvic lymph nodes were harvested anterior and medial to the great  vessels with the following anatomic landmarks:  Laterally the tendon and  psoas muscle, distally the deep circumflex iliac vein, and proximally  the distal common iliac artery.  Lymph nodes were harvested along the  hypogastric artery and from the obturator space anterior to the  obturator nerves.  All vital structures were identified and preserved  throughout.  Each infundibulopelvic ligament was then skeletonized and  divided with the harmonic.  The posterior leaf of the broad ligament was  taken down on either side with the harmonic.   Bladder flap was developed using blunt dissection with the harmonic  scalpel and uterine vessels skeletonized.  The uterine arteries were  divided and sealed using the harmonic scalpel.  After ensuring that the  co-ring was able to be palpated circumferentially; and the bladder flap  had been advanced off of the upper vagina, the monopolar electrocautery  was used to perform a vaginotomy circumferentially.  The uterus was  removed transvaginally and the pelvis irrigated.  The vaginal  cuff was  closed with interrupted figure-of-eight sutures of #0 Vicryl using the  Endo stitch device and extracorporeal knot tying.  The pelvis was  copiously irrigated and inspected for hemostasis which was achieved with  point electrocautery.   At this juncture, frozen section returned revealing a 2-cm fundal tumor  which was a low-grade (grade 1) lesion invading into superficial  myometrium only.  Because of difficulties in exposure deep due to  habitus, we elected not to perform a periaortic lymph node dissection.  The pneumoperitoneum was evacuated; and all trocars removed.  The  fascial incisions in the periumbilical and suprapubic port sites were  closed with interrupted figure-of-eight #0 Vicryl; and the skin  incisions were closed with subcuticular 3-0 Vicryl interrupted  reinforced with Steri-Strips.  The patient tolerated the procedure well  and was returned to the recovery room in stable condition.   ESTIMATED BLOOD LOSS:  110 mL.   TRANSFUSIONS:  None.   DRAINS, PACKS, ETCETERA:  Foley to dependent drainage.   SPONGE AND SPONGE COUNTS:  Correct.   PATHOLOGY SPECIMEN:  Uterus, tubes, and ovaries for frozen section.  Bilateral pelvic lymph nodes and washings for permanent section.      John T. Kyla Balzarine, M.D.  Electronically Signed     JTS/MEDQ  D:  02/01/2006  T:  02/01/2006  Job:  [id number]   cc:   Telford Nab, R.N.  [address]  [place], [place] [address]   [name], NP   Rollene Rotunda, MD, [facility]  [address]  [address]  [place]  [place] [address]   W. Varney Baas, M.D.  Fax: [phone]

## 2010-07-17 NOTE — Consult Note (Signed)
Alexandra Hunt, Alexandra Hunt                 ACCOUNT NO.:  [id number]   MEDICAL RECORD NO.:  [id number]          PATIENT TYPE:  OUT   LOCATION:  GYN                          FACILITY:  [facility]   PHYSICIAN:  Paola A. Duard Brady, MD    DATE OF BIRTH:  20-May-1945   DATE OF CONSULTATION:  02/16/2006  DATE OF DISCHARGE:                                 CONSULTATION   The patient is a 65 year old who had a D&C perform that showed a grade 1  endometrial carcinoma.  On February 01, 2006 she underwent total  laparoscopic hysterectomy, BSO, bilateral lymphadenectomy and abdominal  pelvic washings.  Final pathology is consistent with a stage IB grade 1  endometrioid adenocarcinoma.  She has a grade 1 disease of superficial  myometrial invasion involving 7 mm out of 23 mm.  There was  lymphovascular space involvement.  The cervix, serosa, myometrium,  bilateral tubes, ovaries and 0/11 lymph nodes were involved.  Washings  were also negative.   GOG DATA:  Based on her age and risk factors, she has low risk for  recurrence and therefore does not need any additional adjuvant therapy.   She comes in today quite bothered by panic attacks.  She needed to call  EMS to her house yesterday who treated with her with oxygen and calmed  her down. Her sugars have been very poorly controlled, her sugars are  running from 60-250, her preop hemoglobin A1c was 8.6.  She states she  wishes to have a new primary physician and essentially has been fairly  noncompliant.  She is complaining of dizziness, blurred vision, but it  is difficult to ascertain if some of this may be related to her poor  glycemic control.  She does complain of constipation which is relieved  with stool softeners taken twice daily.  She is eating well.  She denies  any vaginal bleeding.   PHYSICAL EXAMINATION:  VITAL SIGNS:  Weight 174 pounds, blood pressure  154/78, pulse 96.  GENERAL:  Well-nourished, well-developed female in no acute distress.  ABDOMEN:  Her incisions are healing well. The suprapubic incision has a  superficial separation.  It is an area where her pannus overhangs. The  area was cleansed with hydrogen peroxide and she was shown how to place  gauze to avoid skin from touching skin.  PELVIC:  External genitalia is within normal limits. The vagina is  slightly atrophic.  The vaginal cuff is visualized, sutures are still  visible.  Bimanual examination reveals no cuff tenderness or masses.   ASSESSMENT:  A 66 year old with a stage IB endometrioid adenocarcinoma.   PLAN:  1. She requests Ativan. She was given a prescription for Ativan 1 mg      q.8 h p.r.n.  2. If she wishes to find a new a primary physician, she may do so.  I      do encourage her to do that as soon as      possible because I think she needs to improve her diabetic control.  3. The follow-up plan was discussed with her. She will return  to see      [place] in 4 months for her first visit and then at that point we will      begin alternating visits with Dr. Jennette Kettle.      Rejeana Brock A. Duard Brady, MD  Electronically Signed     PAG/MEDQ  D:  02/16/2006  T:  02/16/2006  Job:  [id number]   cc:   [name], MD   Rollene Rotunda, MD, [facility]  [address]  [address]  [place]  [place] [address]   W. Varney Baas, M.D.  Fax: [phone]   Telford Nab, R.N.  [address]  [place], [place] [address]

## 2012-05-23 ENCOUNTER — Other Ambulatory Visit: Payer: Self-pay | Admitting: Nurse Practitioner

## 2012-05-30 ENCOUNTER — Other Ambulatory Visit: Payer: Self-pay | Admitting: Nurse Practitioner

## 2012-06-27 ENCOUNTER — Other Ambulatory Visit: Payer: Self-pay | Admitting: Nurse Practitioner

## 2012-06-28 NOTE — Telephone Encounter (Signed)
LAST LABS 11/13. AIC 7.6

## 2012-07-04 ENCOUNTER — Other Ambulatory Visit: Payer: Self-pay

## 2012-07-04 MED ORDER — LORAZEPAM 1 MG PO TABS: 1.0000 mg | ORAL_TABLET | Freq: Two times a day (BID) | ORAL | Status: DC | PRN

## 2012-07-04 NOTE — Telephone Encounter (Signed)
Last filled 05/08/12   Last seen 01/24/12   If approved have nurse call in and notify patient

## 2012-07-04 NOTE — Telephone Encounter (Signed)
Please call in Lorazepam rx- [facility] for future refills

## 2012-07-04 NOTE — Telephone Encounter (Signed)
Called to [facility] 

## 2012-07-15 ENCOUNTER — Other Ambulatory Visit: Payer: Self-pay | Admitting: Nurse Practitioner

## 2012-07-17 NOTE — Telephone Encounter (Signed)
Last A1C 11/13

## 2012-07-18 ENCOUNTER — Telehealth: Payer: Self-pay | Admitting: Nurse Practitioner

## 2012-07-19 MED ORDER — INSULIN GLARGINE 100 UNIT/ML ~~LOC~~ SOLN: 100.0000 [IU] | Freq: Every day | SUBCUTANEOUS | Status: DC

## 2012-07-19 NOTE — Telephone Encounter (Signed)
Lmom, RX taken care of.

## 2012-07-19 NOTE — Telephone Encounter (Signed)
Mmm please address? 

## 2012-07-19 NOTE — Telephone Encounter (Signed)
Think I fixed it- Let patient know

## 2012-07-30 ENCOUNTER — Other Ambulatory Visit: Payer: Self-pay | Admitting: Nurse Practitioner

## 2012-08-01 NOTE — Telephone Encounter (Signed)
Last seen 01/24/12

## 2012-08-08 ENCOUNTER — Encounter (INDEPENDENT_AMBULATORY_CARE_PROVIDER_SITE_OTHER): Payer: Self-pay | Admitting: Ophthalmology

## 2012-08-17 ENCOUNTER — Telehealth: Payer: Self-pay | Admitting: Nurse Practitioner

## 2012-08-17 NOTE — Telephone Encounter (Signed)
appt made

## 2012-08-22 ENCOUNTER — Telehealth: Payer: Self-pay | Admitting: Nurse Practitioner

## 2012-08-24 ENCOUNTER — Encounter (INDEPENDENT_AMBULATORY_CARE_PROVIDER_SITE_OTHER): Payer: Self-pay | Admitting: Ophthalmology

## 2012-08-28 ENCOUNTER — Ambulatory Visit: Payer: Self-pay | Admitting: Nurse Practitioner

## 2012-08-30 ENCOUNTER — Other Ambulatory Visit: Payer: Medicare Other

## 2012-08-30 ENCOUNTER — Other Ambulatory Visit: Payer: Self-pay | Admitting: Nurse Practitioner

## 2012-08-30 ENCOUNTER — Ambulatory Visit (INDEPENDENT_AMBULATORY_CARE_PROVIDER_SITE_OTHER): Payer: Medicare Other | Admitting: Nurse Practitioner

## 2012-08-30 ENCOUNTER — Encounter: Payer: Self-pay | Admitting: Nurse Practitioner

## 2012-08-30 VITALS — BP 115/57 | HR 94 | Temp 98.6°F | Ht 64.0 in | Wt 198.0 lb

## 2012-08-30 DIAGNOSIS — E119 Type 2 diabetes mellitus without complications: Secondary | ICD-10-CM

## 2012-08-30 DIAGNOSIS — F411 Generalized anxiety disorder: Secondary | ICD-10-CM

## 2012-08-30 DIAGNOSIS — F329 Major depressive disorder, single episode, unspecified: Secondary | ICD-10-CM

## 2012-08-30 DIAGNOSIS — E785 Hyperlipidemia, unspecified: Secondary | ICD-10-CM

## 2012-08-30 DIAGNOSIS — K219 Gastro-esophageal reflux disease without esophagitis: Secondary | ICD-10-CM

## 2012-08-30 DIAGNOSIS — I10 Essential (primary) hypertension: Secondary | ICD-10-CM

## 2012-08-30 LAB — COMPLETE METABOLIC PANEL WITH GFR
ALT: 10 U/L (ref 0–35)
AST: 12 U/L (ref 0–37)
Alkaline Phosphatase: 43 U/L (ref 39–117)
BUN: 22 mg/dL (ref 6–23)
Creat: 1.09 mg/dL (ref 0.50–1.10)

## 2012-08-30 LAB — POCT GLYCOSYLATED HEMOGLOBIN (HGB A1C): Hemoglobin A1C: 7.4

## 2012-08-30 LAB — POCT UA - MICROALBUMIN: Microalbumin Ur, POC: NEGATIVE mg/L

## 2012-08-30 MED ORDER — LORAZEPAM 1 MG PO TABS: 1.0000 mg | ORAL_TABLET | Freq: Two times a day (BID) | ORAL | Status: DC | PRN

## 2012-08-30 MED ORDER — LISINOPRIL 10 MG PO TABS: 10.0000 mg | ORAL_TABLET | Freq: Every day | ORAL | Status: DC

## 2012-08-30 MED ORDER — INSULIN NPH (HUMAN) (ISOPHANE) 100 UNIT/ML ~~LOC~~ SUSP: SUBCUTANEOUS | Status: DC

## 2012-08-30 MED ORDER — SERTRALINE HCL 100 MG PO TABS: 100.0000 mg | ORAL_TABLET | Freq: Every day | ORAL | Status: DC

## 2012-08-30 MED ORDER — FENOFIBRATE 160 MG PO TABS: 160.0000 mg | ORAL_TABLET | Freq: Every day | ORAL | Status: DC

## 2012-08-30 MED ORDER — FUROSEMIDE 20 MG PO TABS: 20.0000 mg | ORAL_TABLET | Freq: Every day | ORAL | Status: DC

## 2012-08-30 MED ORDER — METFORMIN HCL 500 MG PO TABS: 500.0000 mg | ORAL_TABLET | Freq: Two times a day (BID) | ORAL | Status: DC

## 2012-08-30 MED ORDER — INSULIN GLARGINE 100 UNIT/ML ~~LOC~~ SOLN: 100.0000 [IU] | Freq: Every day | SUBCUTANEOUS | Status: DC

## 2012-08-30 NOTE — Progress Notes (Signed)
Subjective:    Patient ID: Alexandra Hunt, female    DOB: [birth year], 67 y.o.   MRN: [id number]  Hypertension This is a chronic problem. The current episode started more than 1 year ago. The problem has been resolved since onset. The problem is controlled. Pertinent negatives include no blurred vision, chest pain, headaches, neck pain, orthopnea, peripheral edema or shortness of breath. There are no associated agents to hypertension. Risk factors for coronary artery disease include diabetes mellitus, dyslipidemia, post-menopausal state and sedentary lifestyle. Past treatments include diuretics. The current treatment provides mild improvement. Compliance problems include diet.  Hypertensive end-organ damage includes kidney disease, CAD/MI and retinopathy.  Hyperlipidemia This is a chronic problem. The current episode started more than 1 year ago. The problem is controlled. Recent lipid tests were reviewed and are normal. Exacerbating diseases include diabetes. She has no history of obesity. Pertinent negatives include no chest pain or shortness of breath. Current antihyperlipidemic treatment includes statins. The current treatment provides moderate improvement of lipids.  Diabetes She presents for her follow-up diabetic visit. She has type 2 diabetes mellitus. No MedicAlert identification noted. The initial diagnosis of diabetes was made 15 years ago. Pertinent negatives for hypoglycemia include no headaches. Pertinent negatives for diabetes include no blurred vision and no chest pain. There are no hypoglycemic complications. Symptoms are stable. Diabetic complications include nephropathy and retinopathy. Risk factors for coronary artery disease include dyslipidemia, hypertension, obesity and post-menopausal. Current diabetic treatment includes oral agent (monotherapy). She is compliant with treatment some of the time. She is currently taking insulin at bedtime (lantus- and does sliding scale with meals).  Insulin injections are given by patient. Rotation sites for injection include the abdominal wall. Her weight is stable. When asked about meal planning, she reported none. She has not had a previous visit with a dietician. She rarely participates in exercise. Her home blood glucose trend is fluctuating minimally. Her breakfast blood glucose is taken between 7-8 am. Her breakfast blood glucose range is generally 90-110 mg/dl. Her lunch blood glucose is taken between 1-2 pm. Her lunch blood glucose range is generally 180-200 mg/dl. Her dinner blood glucose is taken between 7-8 pm. Her dinner blood glucose range is generally >200 mg/dl. Her highest blood glucose is >200 mg/dl. Her overall blood glucose range is 140-180 mg/dl. An ACE inhibitor/angiotensin II receptor blocker is being taken. She does not see a podiatrist.Eye exam is not current.      Review of Systems  HENT: Negative for neck pain.   Eyes: Negative for blurred vision.  Respiratory: Negative for shortness of breath.   Cardiovascular: Negative for chest pain and orthopnea.  Neurological: Negative for headaches.       Objective:   Physical Exam  Constitutional: She is oriented to person, place, and time. She appears well-developed and well-nourished.  HENT:  Nose: Nose normal.  Mouth/Throat: Oropharynx is clear and moist.  Eyes: EOM are normal.  Neck: Trachea normal, normal range of motion and full passive range of motion without pain. Neck supple. No JVD present. Carotid bruit is not present. No thyromegaly present.  Cardiovascular: Normal rate, regular rhythm, normal heart sounds and intact distal pulses.  Exam reveals no gallop and no friction rub.   No murmur heard. Pulmonary/Chest: Effort normal and breath sounds normal.  Abdominal: Soft. Bowel sounds are normal. She exhibits no distension and no mass. There is no tenderness.  Musculoskeletal: Normal range of motion.  Lymphadenopathy:    She has no cervical adenopathy.  Neurological: She is alert and oriented to person, place, and time. She has normal reflexes.  Skin: Skin is warm and dry.  Psychiatric: She has a normal mood and affect. Her behavior is normal. Judgment and thought content normal.   BP 115/57  Pulse 94  Temp(Src) 98.6 F (37 C) (Oral)  Ht 5\' 4"  (1.626 m)  Wt 198 lb (89.812 kg)  BMI 33.97 kg/m2 Results for orders placed in visit on 08/30/12  POCT GLYCOSYLATED HEMOGLOBIN (HGB A1C)      Result Value Range   Hemoglobin A1C 7.4%            Assessment & Plan:  1. Diabetes Count carbs - HgB A1c - COMPLETE METABOLIC PANEL WITH GFR - NMR Lipoprofile with Lipids - insulin glargine (LANTUS) 100 UNIT/ML injection; Inject 1 mL (100 Units total) into the skin at bedtime.  Dispense: 10 mL; Refill: 12 - insulin NPH (HUMULIN N) 100 UNIT/ML injection; Sliding scale  Dispense: 1 vial; Refill: 12 - metFORMIN (GLUCOPHAGE) 500 MG tablet; Take 1 tablet (500 mg total) by mouth 2 (two) times daily with a meal.  Dispense: 60 tablet; Refill: 5 - POCT UA - Microalbumin  2. Hypertension Low NA+ diet - furosemide (LASIX) 20 MG tablet; Take 1 tablet (20 mg total) by mouth daily.  Dispense: 30 tablet; Refill: 5 - lisinopril (PRINIVIL,ZESTRIL) 10 MG tablet; Take 1 tablet (10 mg total) by mouth daily.  Dispense: 30 tablet; Refill: 5  3. Hyperlipidemia Low fat diet and exercise - fenofibrate 160 MG tablet; Take 1 tablet (160 mg total) by mouth daily.  Dispense: 30 tablet; Refill: 5  4. GERD (gastroesophageal reflux disease) Watch spicy and fatty foods  5. GAD (generalized anxiety disorder) Stress management - LORazepam (ATIVAN) 1 MG tablet; Take 1 tablet (1 mg total) by mouth 2 (two) times daily as needed for anxiety.  Dispense: 60 tablet; Refill: 1  6. Depression exercise - sertraline (ZOLOFT) 100 MG tablet; Take 1 tablet (100 mg total) by mouth daily.  Dispense: 30 tablet; Refill: 5  Mary-Margaret Daphine Deutscher, FNP

## 2012-08-30 NOTE — Patient Instructions (Signed)

## 2012-08-31 LAB — NMR LIPOPROFILE WITH LIPIDS
Cholesterol, Total: 145 mg/dL (ref <200)
HDL Particle Number: 23.7 umol/L — ABNORMAL LOW (ref >=30.5)
HDL-C: 26 mg/dL — ABNORMAL LOW (ref >=40)
LDL (calc): 82 mg/dL (ref <100)
LDL Particle Number: 1327 nmol/L — ABNORMAL HIGH (ref <1000)
LP-IR Score: 87 — ABNORMAL HIGH (ref <=45)
Triglycerides: 184 mg/dL — ABNORMAL HIGH (ref <150)
VLDL Size: 53.9 nm — ABNORMAL HIGH (ref <=46.6)

## 2012-09-04 ENCOUNTER — Other Ambulatory Visit: Payer: Self-pay | Admitting: Nurse Practitioner

## 2012-09-04 ENCOUNTER — Encounter: Payer: Self-pay | Admitting: *Deleted

## 2012-09-04 MED ORDER — LISINOPRIL 20 MG PO TABS: 20.0000 mg | ORAL_TABLET | Freq: Every day | ORAL | Status: DC

## 2012-09-08 ENCOUNTER — Encounter (INDEPENDENT_AMBULATORY_CARE_PROVIDER_SITE_OTHER): Payer: Medicare Other | Admitting: Ophthalmology

## 2012-09-08 DIAGNOSIS — E11319 Type 2 diabetes mellitus with unspecified diabetic retinopathy without macular edema: Secondary | ICD-10-CM

## 2012-09-08 DIAGNOSIS — H43819 Vitreous degeneration, unspecified eye: Secondary | ICD-10-CM

## 2012-09-08 DIAGNOSIS — H2510 Age-related nuclear cataract, unspecified eye: Secondary | ICD-10-CM

## 2012-09-08 DIAGNOSIS — I10 Essential (primary) hypertension: Secondary | ICD-10-CM

## 2012-09-08 DIAGNOSIS — E1139 Type 2 diabetes mellitus with other diabetic ophthalmic complication: Secondary | ICD-10-CM

## 2012-09-08 DIAGNOSIS — H35039 Hypertensive retinopathy, unspecified eye: Secondary | ICD-10-CM

## 2012-09-14 ENCOUNTER — Other Ambulatory Visit: Payer: Self-pay | Admitting: Nurse Practitioner

## 2012-09-14 MED ORDER — INSULIN REGULAR HUMAN 100 UNIT/ML IJ SOLN: INTRAMUSCULAR | Status: DC

## 2012-09-14 NOTE — Progress Notes (Signed)
Pt aware of med.

## 2012-11-22 ENCOUNTER — Telehealth: Payer: Self-pay | Admitting: Nurse Practitioner

## 2012-11-23 NOTE — Telephone Encounter (Signed)
Patient aware.

## 2013-01-02 ENCOUNTER — Ambulatory Visit: Payer: Medicare Other | Admitting: Nurse Practitioner

## 2013-01-24 ENCOUNTER — Ambulatory Visit: Payer: Medicare Other | Admitting: Nurse Practitioner

## 2013-02-05 ENCOUNTER — Encounter (INDEPENDENT_AMBULATORY_CARE_PROVIDER_SITE_OTHER): Payer: Self-pay

## 2013-02-05 ENCOUNTER — Other Ambulatory Visit (INDEPENDENT_AMBULATORY_CARE_PROVIDER_SITE_OTHER): Payer: Medicare Other

## 2013-02-05 DIAGNOSIS — E119 Type 2 diabetes mellitus without complications: Secondary | ICD-10-CM

## 2013-02-05 DIAGNOSIS — I10 Essential (primary) hypertension: Secondary | ICD-10-CM

## 2013-02-05 DIAGNOSIS — E559 Vitamin D deficiency, unspecified: Secondary | ICD-10-CM

## 2013-02-05 DIAGNOSIS — E785 Hyperlipidemia, unspecified: Secondary | ICD-10-CM

## 2013-02-05 LAB — POCT GLYCOSYLATED HEMOGLOBIN (HGB A1C): Hemoglobin A1C: 8.9

## 2013-02-05 NOTE — Progress Notes (Signed)
Pt came in for labs only 

## 2013-02-07 ENCOUNTER — Encounter: Payer: Self-pay | Admitting: Nurse Practitioner

## 2013-02-07 ENCOUNTER — Ambulatory Visit (INDEPENDENT_AMBULATORY_CARE_PROVIDER_SITE_OTHER): Payer: Medicare Other | Admitting: Nurse Practitioner

## 2013-02-07 VITALS — BP 154/74 | HR 99 | Temp 97.4°F | Ht 64.0 in | Wt 192.0 lb

## 2013-02-07 DIAGNOSIS — E785 Hyperlipidemia, unspecified: Secondary | ICD-10-CM

## 2013-02-07 DIAGNOSIS — F411 Generalized anxiety disorder: Secondary | ICD-10-CM

## 2013-02-07 DIAGNOSIS — Z23 Encounter for immunization: Secondary | ICD-10-CM

## 2013-02-07 DIAGNOSIS — E119 Type 2 diabetes mellitus without complications: Secondary | ICD-10-CM

## 2013-02-07 DIAGNOSIS — I10 Essential (primary) hypertension: Secondary | ICD-10-CM

## 2013-02-07 LAB — VITAMIN D 25 HYDROXY (VIT D DEFICIENCY, FRACTURES): Vit D, 25-Hydroxy: 26.8 ng/mL — ABNORMAL LOW (ref 30.0–100.0)

## 2013-02-07 LAB — NMR, LIPOPROFILE
Cholesterol: 139 mg/dL (ref <200)
HDL Cholesterol by NMR: 28 mg/dL — ABNORMAL LOW (ref >=40)
HDL Particle Number: 26.3 umol/L — ABNORMAL LOW (ref >=30.5)
LDLC SERPL CALC-MCNC: 62 mg/dL (ref <100)
Triglycerides by NMR: 244 mg/dL — ABNORMAL HIGH (ref <150)

## 2013-02-07 LAB — CMP14+EGFR
Albumin: 4.1 g/dL (ref 3.6–4.8)
BUN: 22 mg/dL (ref 8–27)
CO2: 21 mmol/L (ref 18–29)
Calcium: 9.8 mg/dL (ref 8.6–10.2)
Creatinine, Ser: 1.03 mg/dL — ABNORMAL HIGH (ref 0.57–1.00)
Globulin, Total: 2.4 g/dL (ref 1.5–4.5)
Total Protein: 6.5 g/dL (ref 6.0–8.5)

## 2013-02-07 MED ORDER — LORAZEPAM 1 MG PO TABS: 1.0000 mg | ORAL_TABLET | Freq: Two times a day (BID) | ORAL | Status: DC | PRN

## 2013-02-07 MED ORDER — INSULIN DETEMIR 100 UNIT/ML ~~LOC~~ SOLN: 60.0000 [IU] | Freq: Every day | SUBCUTANEOUS | Status: DC

## 2013-02-07 NOTE — Progress Notes (Signed)
Subjective:    Patient ID: Alexandra Hunt, female    DOB: [birth year], 67 y.o.   MRN: [id number]  Hypertension This is a chronic problem. The current episode started more than 1 year ago. The problem has been resolved since onset. The problem is controlled. Pertinent negatives include no blurred vision, chest pain, headaches, neck pain, orthopnea, peripheral edema or shortness of breath. There are no associated agents to hypertension. Risk factors for coronary artery disease include diabetes mellitus, dyslipidemia, post-menopausal state and sedentary lifestyle. Past treatments include diuretics. The current treatment provides mild improvement. Compliance problems include diet.  Hypertensive end-organ damage includes kidney disease, CAD/MI and retinopathy.  Hyperlipidemia This is a chronic problem. The current episode started more than 1 year ago. The problem is controlled. Recent lipid tests were reviewed and are normal. Exacerbating diseases include diabetes. She has no history of obesity. Pertinent negatives include no chest pain or shortness of breath. Current antihyperlipidemic treatment includes statins. The current treatment provides moderate improvement of lipids.  Diabetes She presents for her follow-up diabetic visit. She has type 2 diabetes mellitus. No MedicAlert identification noted. The initial diagnosis of diabetes was made 15 years ago. Pertinent negatives for hypoglycemia include no headaches. Pertinent negatives for diabetes include no blurred vision and no chest pain. There are no hypoglycemic complications. Symptoms are stable. Diabetic complications include nephropathy and retinopathy. Risk factors for coronary artery disease include dyslipidemia, hypertension, obesity and post-menopausal. Current diabetic treatment includes oral agent (monotherapy) and insulin injections (insulin was changed from lantus to levimir at last visit. Blood sugars are not as good as they were.). She is  compliant with treatment some of the time. She is currently taking insulin at bedtime (lantus- and does sliding scale with meals). Insulin injections are given by patient. Rotation sites for injection include the abdominal wall. Her weight is stable. When asked about meal planning, she reported none. She has not had a previous visit with a dietician. She rarely participates in exercise. Her home blood glucose trend is fluctuating minimally. Her breakfast blood glucose is taken between 7-8 am. Her breakfast blood glucose range is generally 90-110 mg/dl. Her lunch blood glucose is taken between 1-2 pm. Her lunch blood glucose range is generally 180-200 mg/dl. Her dinner blood glucose is taken between 7-8 pm. Her dinner blood glucose range is generally >200 mg/dl. Her highest blood glucose is >200 mg/dl. Her overall blood glucose range is 140-180 mg/dl. An ACE inhibitor/angiotensin II receptor blocker is being taken. She does not see a podiatrist.Eye exam is not current.      Review of Systems  Eyes: Negative for blurred vision.  Respiratory: Negative for shortness of breath.   Cardiovascular: Negative for chest pain and orthopnea.  Musculoskeletal: Negative for neck pain.  Neurological: Negative for headaches.       Objective:   Physical Exam  Constitutional: She is oriented to person, place, and time. She appears well-developed and well-nourished.  HENT:  Nose: Nose normal.  Mouth/Throat: Oropharynx is clear and moist.  Eyes: EOM are normal.  Neck: Trachea normal, normal range of motion and full passive range of motion without pain. Neck supple. No JVD present. Carotid bruit is not present. No thyromegaly present.  Cardiovascular: Normal rate, regular rhythm, normal heart sounds and intact distal pulses.  Exam reveals no gallop and no friction rub.   No murmur heard. Pulmonary/Chest: Effort normal and breath sounds normal.  Abdominal: Soft. Bowel sounds are normal. She exhibits no distension  and no mass.  There is no tenderness.  Musculoskeletal: Normal range of motion.  Lymphadenopathy:    She has no cervical adenopathy.  Neurological: She is alert and oriented to person, place, and time. She has normal reflexes.  Skin: Skin is warm and dry.  Psychiatric: She has a normal mood and affect. Her behavior is normal. Judgment and thought content normal.   BP 154/74  Pulse 99  Temp(Src) 97.4 F (36.3 C) (Oral)  Ht 5\' 4"  (1.626 m)  Wt 192 lb (87.091 kg)  BMI 32.94 kg/m2 Results for orders placed in visit on 02/05/13  CMP14+EGFR      Result Value Range   Glucose 116 (*) 65 - 99 mg/dL   BUN 22  8 - 27 mg/dL   Creatinine, Ser 1.61 (*) 0.57 - 1.00 mg/dL   GFR calc non Af Amer 57 (*) >59 mL/min/1.73   GFR calc Af Amer 66  >59 mL/min/1.73   BUN/Creatinine Ratio 21  11 - 26   Sodium 142  134 - 144 mmol/L   Potassium 4.9  3.5 - 5.2 mmol/L   Chloride 101  97 - 108 mmol/L   CO2 21  18 - 29 mmol/L   Calcium 9.8  8.6 - 10.2 mg/dL   Total Protein 6.5  6.0 - 8.5 g/dL   Albumin 4.1  3.6 - 4.8 g/dL   Globulin, Total 2.4  1.5 - 4.5 g/dL   Albumin/Globulin Ratio 1.7  1.1 - 2.5   Total Bilirubin <0.2  0.0 - 1.2 mg/dL   Alkaline Phosphatase 57  39 - 117 IU/L   AST 13  0 - 40 IU/L   ALT 8  0 - 32 IU/L  NMR, LIPOPROFILE      Result Value Range   LDL Particle Number [id number] (*) <1000 nmol/L   LDLC SERPL CALC-MCNC 62  <100 mg/dL   HDL Cholesterol by NMR 28 (*) >=40 mg/dL   Triglycerides by NMR 244 (*) <150 mg/dL   Cholesterol 096  <045 mg/dL   HDL Particle Number 40.9 (*) >=30.5 umol/L   Small LDL Particle Number [id number] (*) <=527 nmol/L   LDL Size 19.8 (*) >20.5 nm   LP-IR Score 83 (*) <=45  VITAMIN D 25 HYDROXY      Result Value Range   Vit D, 25-Hydroxy 26.8 (*) 30.0 - 100.0 ng/mL  POCT GLYCOSYLATED HEMOGLOBIN (HGB A1C)      Result Value Range   Hemoglobin A1C 8.9%      BP 154/74  Pulse 99  Temp(Src) 97.4 F (36.3 C) (Oral)  Ht 5\' 4"  (1.626 m)  Wt 192 lb (87.091 kg)  BMI 32.94  kg/m2       Assessment & Plan:   1. HYPERTENSION, UNSPECIFIED   2. HYPERLIPIDEMIA-MIXED   3. Type II or unspecified type diabetes mellitus without mention of complication, not stated as uncontrolled   4. Need for prophylactic vaccination and inoculation against influenza   5. GAD (generalized anxiety disorder)     Meds ordered this encounter  Medications  . Biotin 1000 MCG tablet    Sig: Take 1,000 mcg by mouth 3 (three) times daily.  Marland Kitchen DISCONTD: insulin detemir (LEVEMIR) 100 UNIT/ML injection    Sig: Inject 100 Units into the skin at bedtime.  . insulin detemir (LEVEMIR) 100 UNIT/ML injection    Sig: Inject 0.6 mLs (60 Units total) into the skin at bedtime.    Dispense:  10 mL    Refill:  12    Order Specific Question:  Supervising Provider    Answer:  Ernestina Penna [[id number]]  . LORazepam (ATIVAN) 1 MG tablet    Sig: Take 1 tablet (1 mg total) by mouth 2 (two) times daily as needed for anxiety.    Dispense:  60 tablet    Refill:  1    Order Specific Question:  Supervising Provider    Answer:  Deborra Medina   Changed levemir dose to 60 u BID- keep diary of blood sugar Continue all meds Labs reviewed at appointment Diet and exercise encouraged Health maintenance reviewed Follow up in 3 months Flu shot today  Mary-Margaret Daphine Deutscher, FNP

## 2013-02-07 NOTE — Patient Instructions (Signed)

## 2013-02-09 ENCOUNTER — Telehealth: Payer: Self-pay | Admitting: *Deleted

## 2013-02-09 ENCOUNTER — Telehealth: Payer: Self-pay | Admitting: Nurse Practitioner

## 2013-02-09 DIAGNOSIS — Z1231 Encounter for screening mammogram for malignant neoplasm of breast: Secondary | ICD-10-CM

## 2013-02-09 NOTE — Telephone Encounter (Signed)
Message copied by Tamera Punt on Fri Feb 09, 2013 11:42 AM ------      Message from: Bennie Pierini      Created: Wed Feb 07, 2013  2:09 PM       Make sure patient has appointment so we can discuss her lab results ------

## 2013-02-13 NOTE — Telephone Encounter (Signed)
Referral made for mammogram- they will call with appointment

## 2013-02-13 NOTE — Telephone Encounter (Signed)
Patient is aware 

## 2013-02-13 NOTE — Telephone Encounter (Signed)
Returning Wendy's call °

## 2013-03-04 ENCOUNTER — Other Ambulatory Visit: Payer: Self-pay | Admitting: Nurse Practitioner

## 2013-03-13 ENCOUNTER — Other Ambulatory Visit: Payer: Self-pay | Admitting: Nurse Practitioner

## 2013-03-16 ENCOUNTER — Other Ambulatory Visit: Payer: Self-pay | Admitting: Nurse Practitioner

## 2013-03-23 ENCOUNTER — Telehealth: Payer: Self-pay | Admitting: Nurse Practitioner

## 2013-03-26 NOTE — Telephone Encounter (Signed)
Faxed form 03/26/13

## 2013-04-04 ENCOUNTER — Other Ambulatory Visit: Payer: Self-pay | Admitting: Nurse Practitioner

## 2013-04-04 ENCOUNTER — Telehealth: Payer: Self-pay | Admitting: Nurse Practitioner

## 2013-04-09 ENCOUNTER — Encounter: Payer: Self-pay | Admitting: Family Medicine

## 2013-04-09 ENCOUNTER — Telehealth: Payer: Self-pay | Admitting: Family Medicine

## 2013-04-09 ENCOUNTER — Ambulatory Visit (INDEPENDENT_AMBULATORY_CARE_PROVIDER_SITE_OTHER): Payer: Medicare HMO | Admitting: Family Medicine

## 2013-04-09 ENCOUNTER — Ambulatory Visit (INDEPENDENT_AMBULATORY_CARE_PROVIDER_SITE_OTHER): Payer: Medicare HMO

## 2013-04-09 VITALS — BP 129/77 | HR 82 | Temp 97.6°F | Ht 64.0 in | Wt 191.4 lb

## 2013-04-09 DIAGNOSIS — L0100 Impetigo, unspecified: Secondary | ICD-10-CM

## 2013-04-09 DIAGNOSIS — M549 Dorsalgia, unspecified: Secondary | ICD-10-CM

## 2013-04-09 DIAGNOSIS — R21 Rash and other nonspecific skin eruption: Secondary | ICD-10-CM

## 2013-04-09 MED ORDER — MELOXICAM 15 MG PO TABS: 15.0000 mg | ORAL_TABLET | Freq: Every day | ORAL | Status: DC

## 2013-04-09 MED ORDER — MUPIROCIN 2 % EX OINT: 1.0000 "application " | TOPICAL_OINTMENT | Freq: Two times a day (BID) | CUTANEOUS | Status: AC

## 2013-04-09 NOTE — Progress Notes (Signed)
Patient ID: Alexandra Hunt, female   DOB: 1945/10/28, 68 y.o.   MRN: [id number] SUBJECTIVE: CC: Chief Complaint  Patient presents with  . Acute Visit    low back pain states she lost lorazepam rx that was given to her by  MMM    HPI: Back pain for 6 months. Slowly and gradually got worse.no leg pain no weakness. Has had back problems chronically. Recent flare up.  Past Medical History  Diagnosis Date  . Diabetes mellitus without complication   . Hypertension   . Hyperlipidemia   . Depression   . Cancer    Past Surgical History  Procedure Laterality Date  . Abdominal hysterectomy    . Breast surgery    . Tubal ligation     History   Social History  . Marital Status: Married    Spouse Name: N/A    Number of Children: N/A  . Years of Education: N/A   Occupational History  . Not on file.   Social History Main Topics  . Smoking status: Never Smoker   . Smokeless tobacco: Not on file  . Alcohol Use: No  . Drug Use: No  . Sexual Activity: Not on file   Other Topics Concern  . Not on file   Social History Narrative  . No narrative on file   No family history on file. Current Outpatient Prescriptions on File Prior to Visit  Medication Sig Dispense Refill  . aspirin 81 MG tablet Take 81 mg by mouth daily.      . B-D INS SYR ULTRAFINE 1CC/31G 31G X 5/16" 1 ML MISC USE DAILY WITH LANTUS AND HUMALOG INSULIN SLIDING SCALES AS INSTRUCTED  100 each  0  . Biotin 1000 MCG tablet Take 1,000 mcg by mouth 3 (three) times daily.      . cholecalciferol (VITAMIN D) 1000 UNITS tablet Take 1,000 Units by mouth daily.      . fenofibrate 160 MG tablet TAKE ONE TABLET BY MOUTH ONE TIME DAILY  30 tablet  4  . fish oil-omega-3 fatty acids 1000 MG capsule Take 2 g by mouth daily.      . furosemide (LASIX) 20 MG tablet Take 1 tablet (20 mg total) by mouth daily.  30 tablet  5  . insulin detemir (LEVEMIR) 100 UNIT/ML injection Inject 0.6 mLs (60 Units total) into the skin at bedtime.  10 mL   12  . insulin glargine (LANTUS) 100 UNIT/ML injection Inject 1 mL (100 Units total) into the skin at bedtime.  10 mL  12  . insulin regular (HUMULIN R) 100 units/mL injection Sliding scale: less than 80 (6u): 80-120 (11u): 121-150 (12u): 151-200 (13u): 210-250 (14u): 251-300 (16u): over 300 (18u)  10 mL  12  . lisinopril (PRINIVIL,ZESTRIL) 20 MG tablet Take 1 tablet (20 mg total) by mouth daily.  90 tablet  3  . LORazepam (ATIVAN) 1 MG tablet Take 1 tablet (1 mg total) by mouth 2 (two) times daily as needed for anxiety.  60 tablet  1  . metFORMIN (GLUCOPHAGE) 500 MG tablet Take 1 tablet (500 mg total) by mouth 2 (two) times daily with a meal.  60 tablet  5  . ONE TOUCH ULTRA TEST test strip USE TO TEST BLOOD SUGAR AT LEAST FOUR TIMES A DAY (ON SLIDING SCALE)  100 each  1  . sertraline (ZOLOFT) 100 MG tablet Take 1 tablet (100 mg total) by mouth daily.  30 tablet  5   No current facility-administered  medications on file prior to visit.   Allergies  Allergen Reactions  . Codeine   . Ivp Dye [Iodinated Diagnostic Agents]   . Latex   . Penicillins   . Sulfa Antibiotics    Immunization History  Administered Date(s) Administered  . Influenza,inj,Quad PF,36+ Mos 02/07/2013   Prior to Admission medications   Medication Sig Start Date End Date Taking? Authorizing Provider  aspirin 81 MG tablet Take 81 mg by mouth daily.   Yes Historical Provider, MD  B-D INS SYR ULTRAFINE 1CC/31G 31G X 5/16" 1 ML MISC USE DAILY WITH LANTUS AND HUMALOG INSULIN SLIDING SCALES AS INSTRUCTED 04/04/13  Yes Mary-Margaret Hassell Done, FNP  Biotin 1000 MCG tablet Take 1,000 mcg by mouth 3 (three) times daily.   Yes Historical Provider, MD  cholecalciferol (VITAMIN D) 1000 UNITS tablet Take 1,000 Units by mouth daily.   Yes Historical Provider, MD  fenofibrate 160 MG tablet TAKE ONE TABLET BY MOUTH ONE TIME DAILY 03/13/13  Yes Mary-Margaret Hassell Done, FNP  fish oil-omega-3 fatty acids 1000 MG capsule Take 2 g by mouth daily.   Yes  Historical Provider, MD  furosemide (LASIX) 20 MG tablet Take 1 tablet (20 mg total) by mouth daily. 08/30/12  Yes Mary-Margaret Hassell Done, FNP  insulin detemir (LEVEMIR) 100 UNIT/ML injection Inject 0.6 mLs (60 Units total) into the skin at bedtime. 02/07/13  Yes Mary-Margaret Hassell Done, FNP  insulin glargine (LANTUS) 100 UNIT/ML injection Inject 1 mL (100 Units total) into the skin at bedtime. 08/30/12  Yes Mary-Margaret Hassell Done, FNP  insulin regular (HUMULIN R) 100 units/mL injection Sliding scale: less than 80 (6u): 80-120 (11u): 121-150 (12u): 151-200 (13u): 210-250 (14u): 251-300 (16u): over 300 (18u) 09/14/12  Yes Mary-Margaret Hassell Done, FNP  lisinopril (PRINIVIL,ZESTRIL) 20 MG tablet Take 1 tablet (20 mg total) by mouth daily. 09/04/12  Yes Mary-Margaret Hassell Done, FNP  LORazepam (ATIVAN) 1 MG tablet Take 1 tablet (1 mg total) by mouth 2 (two) times daily as needed for anxiety. 02/07/13  Yes Mary-Margaret Hassell Done, FNP  metFORMIN (GLUCOPHAGE) 500 MG tablet Take 1 tablet (500 mg total) by mouth 2 (two) times daily with a meal. 08/30/12  Yes Mary-Margaret Hassell Done, FNP  ONE TOUCH ULTRA TEST test strip USE TO TEST BLOOD SUGAR AT LEAST FOUR TIMES A DAY (ON SLIDING SCALE) 03/04/13  Yes Chipper Herb, MD  sertraline (ZOLOFT) 100 MG tablet Take 1 tablet (100 mg total) by mouth daily. 08/30/12  Yes Mary-Margaret Hassell Done, FNP     ROS: As above in the HPI. All other systems are stable or negative.  OBJECTIVE: APPEARANCE:  Patient in no acute distress.The patient appeared well nourished and normally developed. Acyanotic. Waist: VITAL SIGNS:BP 129/77  Pulse 82  Temp(Src) 97.6 F (36.4 C) (Oral)  Ht 5\' 4"  (1.626 m)  Wt 191 lb 6.4 oz (86.818 kg)  BMI 32.84 kg/m2 Obese WF  SKIN: warm and  Dry .small crusted 3 to 55mm plaques.with crusts and scales.a small clusters over the lumbar area. And few over the scapular area and a couple on the forearms.   HEAD and Neck: without JVD, Head and scalp: normal Eyes:No scleral  icterus. Fundi normal, eye movements normal. Ears: Auricle normal, canal normal, Tympanic membranes normal, insufflation normal. Nose: normal Throat: normal Neck & thyroid: normal  CHEST & LUNGS: Chest wall: normal Lungs: Clear  CVS: Reveals the PMI to be normally located. Regular rhythm, First and Second Heart sounds are normal,  absence of murmurs, rubs or gallops. Peripheral vasculature: Radial pulses: normal Dorsal pedis  pulses: normal Posterior pulses: normal  ABDOMEN:  Appearance: obese Benign, no organomegaly, no masses, no Abdominal Aortic enlargement. No Guarding , no rebound. No Bruits. Bowel sounds: normal  RECTAL: N/A GU: N/A  EXTREMETIES: nonedematous.  MUSCULOSKELETAL:  Spine: reduced ROM   NEUROLOGIC: oriented to time,place and person; nonfocal.  ASSESSMENT: Back pain - Plan: DG Lumbar Spine 2-3 Views, meloxicam (MOBIC) 15 MG tablet  Rash and nonspecific skin eruption - Plan: mupirocin ointment (BACTROBAN) 2 %  Impetigo - Plan: mupirocin ointment (BACTROBAN) 2 % Impetigo vs psoriatic plaque  PLAN: Will treat skin lesions and if not resolved consider Dermatology referral.  Orders Placed This Encounter  Procedures  . DG Lumbar Spine 2-3 Views    Standing Status: Future     Number of Occurrences: 1     Standing Expiration Date: 06/09/2014    Order Specific Question:  Reason for Exam (SYMPTOM  OR DIAGNOSIS REQUIRED)    Answer:  back pain    Order Specific Question:  Preferred imaging location?    Answer:  Internal   [facility] reading (PRIMARY) by  Dr. Jacelyn Grip: L5-S1 degenerative disk disease                                Meds ordered this encounter  Medications  . meloxicam (MOBIC) 15 MG tablet    Sig: Take 1 tablet (15 mg total) by mouth daily.    Dispense:  30 tablet    Refill:  0  . mupirocin ointment (BACTROBAN) 2 %    Sig: Place 1 application into the nose 2 (two) times daily.    Dispense:  22 g    Refill:  1   There are no discontinued  medications. Return in about 2 weeks (around 04/23/2013) for Recheck medical problems.  Deann Mclaine P. Jacelyn Grip, M.D.

## 2013-04-09 NOTE — Patient Instructions (Signed)
Back Exercises Back exercises help treat and prevent back injuries. The goal of back exercises is to increase the strength of your abdominal and back muscles and the flexibility of your back. These exercises should be started when you no longer have back pain. Back exercises include:  Pelvic Tilt. Lie on your back with your knees bent. Tilt your pelvis until the lower part of your back is against the floor. Hold this position 5 to 10 sec and repeat 5 to 10 times.  Knee to Chest. Pull first 1 knee up against your chest and hold for 20 to 30 seconds, repeat this with the other knee, and then both knees. This may be done with the other leg straight or bent, whichever feels better.  Sit-Ups or Curl-Ups. Bend your knees 90 degrees. Start with tilting your pelvis, and do a partial, slow sit-up, lifting your trunk only 30 to 45 degrees off the floor. Take at least 2 to 3 seconds for each sit-up. Do not do sit-ups with your knees out straight. If partial sit-ups are difficult, simply do the above but with only tightening your abdominal muscles and holding it as directed.  Hip-Lift. Lie on your back with your knees flexed 90 degrees. Push down with your feet and shoulders as you raise your hips a couple inches off the floor; hold for 10 seconds, repeat 5 to 10 times.  Back arches. Lie on your stomach, propping yourself up on bent elbows. Slowly press on your hands, causing an arch in your low back. Repeat 3 to 5 times. Any initial stiffness and discomfort should lessen with repetition over time.  Shoulder-Lifts. Lie face down with arms beside your body. Keep hips and torso pressed to floor as you slowly lift your head and shoulders off the floor. Do not overdo your exercises, especially in the beginning. Exercises may cause you some mild back discomfort which lasts for a few minutes; however, if the pain is more severe, or lasts for more than 15 minutes, do not continue exercises until you see your caregiver.  Improvement with exercise therapy for back problems is slow.  See your caregivers for assistance with developing a proper back exercise program. Document Released: 03/25/2004 Document Revised: 05/10/2011 Document Reviewed: 12/17/2010 [employer] Patient Information 2014 [employer].  

## 2013-04-09 NOTE — Telephone Encounter (Signed)
appt at 4:30 with wong

## 2013-04-11 NOTE — Telephone Encounter (Signed)
No answer, no machine. Faxed 03/26/13

## 2013-04-11 NOTE — Telephone Encounter (Signed)
This was faxed 03/26/13

## 2013-04-25 ENCOUNTER — Other Ambulatory Visit: Payer: Self-pay | Admitting: Nurse Practitioner

## 2013-05-01 ENCOUNTER — Ambulatory Visit: Payer: Medicare HMO

## 2013-05-02 ENCOUNTER — Other Ambulatory Visit: Payer: Self-pay | Admitting: Nurse Practitioner

## 2013-05-05 ENCOUNTER — Other Ambulatory Visit: Payer: Self-pay | Admitting: Nurse Practitioner

## 2013-05-05 ENCOUNTER — Other Ambulatory Visit: Payer: Self-pay | Admitting: Family Medicine

## 2013-05-15 ENCOUNTER — Ambulatory Visit (INDEPENDENT_AMBULATORY_CARE_PROVIDER_SITE_OTHER): Payer: Medicare HMO | Admitting: Pharmacist Clinician (PhC)/ Clinical Pharmacy Specialist

## 2013-05-15 DIAGNOSIS — E781 Pure hyperglyceridemia: Secondary | ICD-10-CM

## 2013-05-15 DIAGNOSIS — I10 Essential (primary) hypertension: Secondary | ICD-10-CM

## 2013-05-15 DIAGNOSIS — F411 Generalized anxiety disorder: Secondary | ICD-10-CM

## 2013-05-15 DIAGNOSIS — E1165 Type 2 diabetes mellitus with hyperglycemia: Secondary | ICD-10-CM

## 2013-05-15 DIAGNOSIS — IMO0001 Reserved for inherently not codable concepts without codable children: Secondary | ICD-10-CM

## 2013-05-15 MED ORDER — FENOFIBRATE 160 MG PO TABS: ORAL_TABLET | ORAL | Status: DC

## 2013-05-15 MED ORDER — LISINOPRIL 20 MG PO TABS: 20.0000 mg | ORAL_TABLET | Freq: Every day | ORAL | Status: DC

## 2013-05-15 MED ORDER — LORAZEPAM 1 MG PO TABS: 1.0000 mg | ORAL_TABLET | Freq: Two times a day (BID) | ORAL | Status: DC | PRN

## 2013-05-15 NOTE — Progress Notes (Signed)
Patient comes in today for review of diabetic eating habits and also to discuss supplements she wants to start taking.  Patient has tremendous social stressors secondary to 66 year old daughter who lives with them and is a recovering drug addict on a methadone program and has a 4 year daughter that is difficult who lives in the house.  Patient feels like she is a prisoner in her own home and her daughter has taken over everything and bullies her and husband.  They have called Sheriff in past to remove daughter, but husband would not allow it.  I gave her a list of counselors to see since she expressed interest after talking with me today.  We discussed in detail correct eating habits for type 2 diabetics.  I kept her medications the same for now due to her starting a new diet and supplement program which should decreased BG readings.  She will call me in two weeks with results of BG and weight.

## 2013-05-22 ENCOUNTER — Telehealth: Payer: Self-pay

## 2013-05-31 ENCOUNTER — Other Ambulatory Visit: Payer: Self-pay | Admitting: Family Medicine

## 2013-06-05 ENCOUNTER — Other Ambulatory Visit: Payer: Self-pay | Admitting: Nurse Practitioner

## 2013-06-05 ENCOUNTER — Other Ambulatory Visit: Payer: Self-pay | Admitting: Family Medicine

## 2013-06-05 ENCOUNTER — Telehealth: Payer: Self-pay | Admitting: Pharmacist Clinician (PhC)/ Clinical Pharmacy Specialist

## 2013-06-05 DIAGNOSIS — N6320 Unspecified lump in the left breast, unspecified quadrant: Secondary | ICD-10-CM

## 2013-06-06 NOTE — Telephone Encounter (Signed)
Patient NTBS for follow up and lab work  

## 2013-06-06 NOTE — Telephone Encounter (Signed)
Last A1C 12/14 - 8.9%.

## 2013-06-08 ENCOUNTER — Other Ambulatory Visit: Payer: Self-pay | Admitting: Nurse Practitioner

## 2013-06-12 NOTE — Telephone Encounter (Signed)
Patient called about using a nutritional supplement with all of her medications called Thrive.  I reviewed the ingredients and there is no interaction with her medications.  She will monitor BP and heart rate to make sure they don't increase.

## 2013-06-14 ENCOUNTER — Other Ambulatory Visit: Payer: Self-pay

## 2013-06-20 ENCOUNTER — Other Ambulatory Visit: Payer: Self-pay | Admitting: Nurse Practitioner

## 2013-06-20 NOTE — Telephone Encounter (Signed)
Please call in ativan 1mg 1 po BID #60 with 1 refills 

## 2013-06-21 NOTE — Telephone Encounter (Signed)
Rx called in 

## 2013-06-22 ENCOUNTER — Ambulatory Visit (INDEPENDENT_AMBULATORY_CARE_PROVIDER_SITE_OTHER): Payer: Medicare Other | Admitting: Ophthalmology

## 2013-06-23 ENCOUNTER — Other Ambulatory Visit: Payer: Self-pay | Admitting: Nurse Practitioner

## 2013-06-26 ENCOUNTER — Telehealth: Payer: Self-pay | Admitting: Nurse Practitioner

## 2013-06-26 NOTE — Telephone Encounter (Signed)
rx printed and waiting to be picked up

## 2013-06-27 ENCOUNTER — Other Ambulatory Visit: Payer: Self-pay | Admitting: Nurse Practitioner

## 2013-06-27 MED ORDER — INSULIN DETEMIR 100 UNIT/ML ~~LOC~~ SOLN: 60.0000 [IU] | Freq: Two times a day (BID) | SUBCUTANEOUS | Status: DC

## 2013-06-27 NOTE — Telephone Encounter (Signed)
Corrected rx- ntbs for labs

## 2013-06-29 ENCOUNTER — Ambulatory Visit (INDEPENDENT_AMBULATORY_CARE_PROVIDER_SITE_OTHER): Payer: Medicare HMO | Admitting: Ophthalmology

## 2013-06-29 DIAGNOSIS — E1139 Type 2 diabetes mellitus with other diabetic ophthalmic complication: Secondary | ICD-10-CM

## 2013-06-29 DIAGNOSIS — I10 Essential (primary) hypertension: Secondary | ICD-10-CM

## 2013-06-29 DIAGNOSIS — H2510 Age-related nuclear cataract, unspecified eye: Secondary | ICD-10-CM

## 2013-06-29 DIAGNOSIS — E11319 Type 2 diabetes mellitus with unspecified diabetic retinopathy without macular edema: Secondary | ICD-10-CM

## 2013-06-29 DIAGNOSIS — H43819 Vitreous degeneration, unspecified eye: Secondary | ICD-10-CM

## 2013-06-29 DIAGNOSIS — H35039 Hypertensive retinopathy, unspecified eye: Secondary | ICD-10-CM

## 2013-06-29 DIAGNOSIS — E1165 Type 2 diabetes mellitus with hyperglycemia: Secondary | ICD-10-CM

## 2013-07-03 ENCOUNTER — Other Ambulatory Visit: Payer: Self-pay | Admitting: Nurse Practitioner

## 2013-07-10 ENCOUNTER — Other Ambulatory Visit: Payer: Self-pay | Admitting: Nurse Practitioner

## 2013-07-10 MED ORDER — INSULIN REGULAR HUMAN 100 UNIT/ML IJ SOLN: INTRAMUSCULAR | Status: DC

## 2013-07-10 MED ORDER — INSULIN DETEMIR 100 UNIT/ML ~~LOC~~ SOLN: 60.0000 [IU] | Freq: Two times a day (BID) | SUBCUTANEOUS | Status: DC

## 2013-08-06 ENCOUNTER — Other Ambulatory Visit: Payer: Self-pay | Admitting: Nurse Practitioner

## 2013-08-07 NOTE — Telephone Encounter (Signed)
Patient last labs done on 02-15-13. Saw pharmacist on 05-15-13. Please advise on refills

## 2013-08-08 ENCOUNTER — Other Ambulatory Visit: Payer: Self-pay | Admitting: *Deleted

## 2013-08-08 MED ORDER — SERTRALINE HCL 100 MG PO TABS: ORAL_TABLET | ORAL | Status: DC

## 2013-08-08 NOTE — Telephone Encounter (Signed)
Patient NTBS for follow up and lab work  

## 2013-09-04 ENCOUNTER — Other Ambulatory Visit: Payer: Self-pay | Admitting: Nurse Practitioner

## 2013-09-06 NOTE — Telephone Encounter (Signed)
Last labs 01/2013

## 2013-09-06 NOTE — Telephone Encounter (Signed)
Patient NTBS for follow up and lab work  

## 2013-09-17 ENCOUNTER — Telehealth: Payer: Self-pay | Admitting: Nurse Practitioner

## 2013-09-17 DIAGNOSIS — E785 Hyperlipidemia, unspecified: Secondary | ICD-10-CM

## 2013-09-17 DIAGNOSIS — E119 Type 2 diabetes mellitus without complications: Secondary | ICD-10-CM

## 2013-09-17 DIAGNOSIS — I10 Essential (primary) hypertension: Secondary | ICD-10-CM

## 2013-09-18 NOTE — Telephone Encounter (Signed)
Orders put in to be done on or after AUG 7,2015

## 2013-09-18 NOTE — Telephone Encounter (Signed)
Left message

## 2013-10-05 ENCOUNTER — Other Ambulatory Visit (INDEPENDENT_AMBULATORY_CARE_PROVIDER_SITE_OTHER): Payer: Medicare HMO

## 2013-10-05 DIAGNOSIS — E785 Hyperlipidemia, unspecified: Secondary | ICD-10-CM

## 2013-10-05 DIAGNOSIS — E119 Type 2 diabetes mellitus without complications: Secondary | ICD-10-CM

## 2013-10-05 DIAGNOSIS — I10 Essential (primary) hypertension: Secondary | ICD-10-CM

## 2013-10-05 LAB — POCT GLYCOSYLATED HEMOGLOBIN (HGB A1C): Hemoglobin A1C: 7.9

## 2013-10-06 LAB — NMR, LIPOPROFILE
CHOLESTEROL: 161 mg/dL (ref 100–199)
HDL CHOLESTEROL BY NMR: 30 mg/dL — AB (ref >39)
HDL Particle Number: 31.4 umol/L (ref >=30.5)
LDL PARTICLE NUMBER: 1018 nmol/L — AB (ref <1000)
LDL Size: 19.5 nm (ref >20.5)
LDLC SERPL CALC-MCNC: 68 mg/dL (ref 0–99)
LP-IR SCORE: 82 — AB (ref <=45)
Small LDL Particle Number: 785 nmol/L — ABNORMAL HIGH (ref <=527)
TRIGLYCERIDES BY NMR: 314 mg/dL — AB (ref 0–149)

## 2013-10-06 LAB — CMP14+EGFR
A/G RATIO: 1.4 (ref 1.1–2.5)
ALBUMIN: 4.1 g/dL (ref 3.6–4.8)
ALT: 12 IU/L (ref 0–32)
AST: 15 IU/L (ref 0–40)
Alkaline Phosphatase: 74 IU/L (ref 39–117)
BUN/Creatinine Ratio: 18 (ref 11–26)
BUN: 17 mg/dL (ref 8–27)
CALCIUM: 9.5 mg/dL (ref 8.7–10.3)
CO2: 24 mmol/L (ref 18–29)
Chloride: 104 mmol/L (ref 97–108)
Creatinine, Ser: 0.94 mg/dL (ref 0.57–1.00)
GFR calc Af Amer: 73 mL/min/{1.73_m2} (ref >59)
GFR calc non Af Amer: 63 mL/min/{1.73_m2} (ref >59)
GLUCOSE: 122 mg/dL — AB (ref 65–99)
Globulin, Total: 3 g/dL (ref 1.5–4.5)
Potassium: 5.1 mmol/L (ref 3.5–5.2)
Sodium: 141 mmol/L (ref 134–144)
TOTAL PROTEIN: 7.1 g/dL (ref 6.0–8.5)
Total Bilirubin: <0.2 mg/dL (ref 0.0–1.2)

## 2013-10-08 ENCOUNTER — Telehealth: Payer: Self-pay | Admitting: Family Medicine

## 2013-10-08 NOTE — Telephone Encounter (Signed)
Message copied by Waverly Ferrari on Mon Oct 08, 2013  8:53 AM ------      Message from: Chevis Pretty      Created: Sat Oct 06, 2013  8:10 AM       hgba1c elevated but better then last time- make sure take insulin as rx      Kidney and liver function stable      ldl particle numbers look good but trig are elevated- should improve with better diabetes control- make sure take fenofibrate daily      Continue current meds- low fat diet and exercise and recheck in 3 months       ------

## 2013-10-09 ENCOUNTER — Encounter: Payer: Self-pay | Admitting: Family Medicine

## 2013-10-09 ENCOUNTER — Other Ambulatory Visit: Payer: Self-pay | Admitting: Nurse Practitioner

## 2013-10-11 ENCOUNTER — Ambulatory Visit (INDEPENDENT_AMBULATORY_CARE_PROVIDER_SITE_OTHER): Payer: Medicare HMO | Admitting: Nurse Practitioner

## 2013-10-11 ENCOUNTER — Encounter: Payer: Self-pay | Admitting: Nurse Practitioner

## 2013-10-11 VITALS — BP 111/57 | HR 79 | Temp 98.6°F | Ht 64.0 in | Wt 191.8 lb

## 2013-10-11 DIAGNOSIS — E785 Hyperlipidemia, unspecified: Secondary | ICD-10-CM

## 2013-10-11 DIAGNOSIS — E119 Type 2 diabetes mellitus without complications: Secondary | ICD-10-CM

## 2013-10-11 DIAGNOSIS — F3289 Other specified depressive episodes: Secondary | ICD-10-CM

## 2013-10-11 DIAGNOSIS — F329 Major depressive disorder, single episode, unspecified: Secondary | ICD-10-CM | POA: Insufficient documentation

## 2013-10-11 DIAGNOSIS — F32A Depression, unspecified: Secondary | ICD-10-CM | POA: Insufficient documentation

## 2013-10-11 DIAGNOSIS — Z6832 Body mass index (BMI) 32.0-32.9, adult: Secondary | ICD-10-CM

## 2013-10-11 DIAGNOSIS — I10 Essential (primary) hypertension: Secondary | ICD-10-CM

## 2013-10-11 DIAGNOSIS — G4730 Sleep apnea, unspecified: Secondary | ICD-10-CM

## 2013-10-11 DIAGNOSIS — F411 Generalized anxiety disorder: Secondary | ICD-10-CM | POA: Insufficient documentation

## 2013-10-11 DIAGNOSIS — Z713 Dietary counseling and surveillance: Secondary | ICD-10-CM

## 2013-10-11 LAB — POCT UA - MICROALBUMIN: Microalbumin Ur, POC: 50 mg/L

## 2013-10-11 MED ORDER — FUROSEMIDE 20 MG PO TABS: ORAL_TABLET | ORAL | Status: DC

## 2013-10-11 MED ORDER — INSULIN REGULAR HUMAN 100 UNIT/ML IJ SOLN: INTRAMUSCULAR | Status: DC

## 2013-10-11 MED ORDER — LISINOPRIL 20 MG PO TABS: 20.0000 mg | ORAL_TABLET | Freq: Every day | ORAL | Status: DC

## 2013-10-11 MED ORDER — LORAZEPAM 1 MG PO TABS: ORAL_TABLET | ORAL | Status: DC

## 2013-10-11 MED ORDER — INSULIN DETEMIR 100 UNIT/ML ~~LOC~~ SOLN: 60.0000 [IU] | Freq: Two times a day (BID) | SUBCUTANEOUS | Status: DC

## 2013-10-11 MED ORDER — METFORMIN HCL 500 MG PO TABS: ORAL_TABLET | ORAL | Status: DC

## 2013-10-11 MED ORDER — SERTRALINE HCL 100 MG PO TABS: ORAL_TABLET | ORAL | Status: DC

## 2013-10-11 NOTE — Progress Notes (Signed)
Subjective:    Patient ID: Alexandra Hunt, female    DOB: 1945/05/18, 68 y.o.   MRN: [id number]  Patient here today for follow up of chronic medical problems.   Diabetes She presents for her follow-up diabetic visit. She has type 2 diabetes mellitus. No MedicAlert identification noted. The initial diagnosis of diabetes was made 15 years ago. Pertinent negatives for diabetes include no blurred vision. There are no hypoglycemic complications. Symptoms are stable. Diabetic complications include nephropathy and retinopathy. Risk factors for coronary artery disease include dyslipidemia, hypertension, obesity and post-menopausal. Current diabetic treatment includes oral agent (monotherapy) and insulin injections (insulin was changed from lantus to levimir at last visit. Blood sugars are not as good as they were.). She is compliant with treatment some of the time. She is currently taking insulin at bedtime (lantus- and does sliding scale with meals). Insulin injections are given by patient. Rotation sites for injection include the abdominal wall. Her weight is stable. When asked about meal planning, she reported none. She has not had a previous visit with a dietician. She rarely participates in exercise. Her home blood glucose trend is fluctuating minimally. Her breakfast blood glucose is taken between 7-8 am. Her breakfast blood glucose range is generally 90-110 mg/dl. Her lunch blood glucose is taken between 1-2 pm. Her lunch blood glucose range is generally 180-200 mg/dl. Her dinner blood glucose is taken between 7-8 pm. Her dinner blood glucose range is generally >200 mg/dl. Her highest blood glucose is >200 mg/dl. Her overall blood glucose range is 140-180 mg/dl. An ACE inhibitor/angiotensin II receptor blocker is being taken. She does not see a podiatrist.Eye exam is not current.  Hypertension This is a chronic problem. The current episode started more than 1 year ago. The problem has been resolved since  onset. The problem is controlled. Pertinent negatives include no blurred vision, orthopnea or peripheral edema. There are no associated agents to hypertension. Risk factors for coronary artery disease include diabetes mellitus, dyslipidemia, post-menopausal state and sedentary lifestyle. Past treatments include diuretics. The current treatment provides mild improvement. Compliance problems include diet.  Hypertensive end-organ damage includes kidney disease, CAD/MI and retinopathy.  Hyperlipidemia This is a chronic problem. The current episode started more than 1 year ago. The problem is controlled. Recent lipid tests were reviewed and are normal. Exacerbating diseases include diabetes. She has no history of obesity. Current antihyperlipidemic treatment includes statins. The current treatment provides moderate improvement of lipids.  Depression/GAD Currently taking Zoloft $RemoveBefor'100mg'RKtDUjvJRTfy$  qd and Ativan $RemoveBe'1mg'hZbbLEpVu$  bid.  Still having issues of stress and anxiety at times.  Her grandkids are moving in with her this fall. This is going to increase her stress level.   Review of Systems  Constitutional: Negative.   Eyes: Negative for blurred vision.  Respiratory: Negative.   Cardiovascular: Negative.  Negative for orthopnea.  Skin: Negative.   Neurological: Negative.   Psychiatric/Behavioral: Negative.   All other systems reviewed and are negative.      Objective:   Physical Exam  Constitutional: She is oriented to person, place, and time. She appears well-developed and well-nourished.  HENT:  Nose: Nose normal.  Mouth/Throat: Oropharynx is clear and moist.  Eyes: EOM are normal.  Neck: Trachea normal, normal range of motion and full passive range of motion without pain. Neck supple. No JVD present. Carotid bruit is not present. No thyromegaly present.  Cardiovascular: Normal rate, regular rhythm, normal heart sounds and intact distal pulses.  Exam reveals no gallop and no friction rub.  No murmur  heard. Pulmonary/Chest: Effort normal and breath sounds normal.  Abdominal: Soft. Bowel sounds are normal. She exhibits no distension and no mass. There is no tenderness.  Musculoskeletal: Normal range of motion.       Arms: Lymphadenopathy:    She has no cervical adenopathy.  Neurological: She is alert and oriented to person, place, and time. She has normal reflexes.  Skin: Skin is warm and dry.  4/4 bilateral foot monofilament  Psychiatric: She has a normal mood and affect. Her behavior is normal. Judgment and thought content normal.   BP 111/57  Pulse 79  Temp(Src) 98.6 F (37 C) (Oral)  Ht $R'5\' 4"'RS$  (1.626 m)  Wt 191 lb 12.8 oz (87 kg)  BMI 32.91 kg/m2 Results for orders placed in visit on 10/05/13  CMP14+EGFR      Result Value Ref Range   Glucose 122 (*) 65 - 99 mg/dL   BUN 17  8 - 27 mg/dL   Creatinine, Ser 0.94  0.57 - 1.00 mg/dL   GFR calc non Af Amer 63  >59 mL/min/1.73   GFR calc Af Amer 73  >59 mL/min/1.73   BUN/Creatinine Ratio 18  11 - 26   Sodium 141  134 - 144 mmol/L   Potassium 5.1  3.5 - 5.2 mmol/L   Chloride 104  97 - 108 mmol/L   CO2 24  18 - 29 mmol/L   Calcium 9.5  8.7 - 10.3 mg/dL   Total Protein 7.1  6.0 - 8.5 g/dL   Albumin 4.1  3.6 - 4.8 g/dL   Globulin, Total 3.0  1.5 - 4.5 g/dL   Albumin/Globulin Ratio 1.4  1.1 - 2.5   Total Bilirubin <0.2  0.0 - 1.2 mg/dL   Alkaline Phosphatase 74  39 - 117 IU/L   AST 15  0 - 40 IU/L   ALT 12  0 - 32 IU/L  NMR, LIPOPROFILE      Result Value Ref Range   LDL Particle Number [id number] (*) <1000 nmol/L   LDLC SERPL CALC-MCNC 68  0 - 99 mg/dL   HDL Cholesterol by NMR 30 (*) >39 mg/dL   Triglycerides by NMR 314 (*) 0 - 149 mg/dL   Cholesterol 161  100 - 199 mg/dL   HDL Particle Number 31.4  >=30.5 umol/L   Small LDL Particle Number 785 (*) <=527 nmol/L   LDL Size 19.5  >20.5 nm   LP-IR Score 82 (*) <=45  POCT GLYCOSYLATED HEMOGLOBIN (HGB A1C)      Result Value Ref Range   Hemoglobin A1C 7.9      BP 111/57  Pulse  79  Temp(Src) 98.6 F (37 C) (Oral)  Ht $R'5\' 4"'iK$  (1.626 m)  Wt 191 lb 12.8 oz (87 kg)  BMI 32.91 kg/m2       Assessment & Plan:

## 2013-10-11 NOTE — Addendum Note (Signed)
Addended by: Earlene Plater on: 10/11/2013 03:48 PM   Modules accepted: Orders

## 2013-10-11 NOTE — Patient Instructions (Signed)

## 2013-10-12 LAB — MICROALBUMIN, URINE: MICROALBUM., U, RANDOM: 71.9 ug/mL — AB (ref 0.0–17.0)

## 2013-11-09 ENCOUNTER — Other Ambulatory Visit: Payer: Self-pay | Admitting: Nurse Practitioner

## 2013-11-13 ENCOUNTER — Other Ambulatory Visit: Payer: Self-pay | Admitting: *Deleted

## 2013-11-13 DIAGNOSIS — F411 Generalized anxiety disorder: Secondary | ICD-10-CM

## 2013-11-13 MED ORDER — LORAZEPAM 1 MG PO TABS: ORAL_TABLET | ORAL | Status: DC

## 2013-11-13 NOTE — Telephone Encounter (Signed)
Last refill 10/08/13. Epic has a Hydrologist 10/11/13 but Andrey Cota does not have it on file. If approved call to [facility].

## 2013-11-13 NOTE — Telephone Encounter (Signed)
Please call in ativan with 1 refills 

## 2013-11-14 ENCOUNTER — Telehealth: Payer: Self-pay | Admitting: Nurse Practitioner

## 2013-11-14 DIAGNOSIS — E119 Type 2 diabetes mellitus without complications: Secondary | ICD-10-CM

## 2013-11-14 NOTE — Telephone Encounter (Signed)
Called in.

## 2013-11-15 NOTE — Telephone Encounter (Signed)
Is it levemir that she needs

## 2013-11-19 MED ORDER — INSULIN DETEMIR 100 UNIT/ML ~~LOC~~ SOLN: 60.0000 [IU] | Freq: Two times a day (BID) | SUBCUTANEOUS | Status: DC

## 2013-11-19 NOTE — Telephone Encounter (Signed)
Informed pt that sample of Levemir is ready for pick up Sample to front

## 2014-02-11 ENCOUNTER — Telehealth: Payer: Self-pay | Admitting: Nurse Practitioner

## 2014-02-11 NOTE — Telephone Encounter (Signed)
Needs clarification on insulin rx from 12/2011. [facility] faxing over rx attn; [name].

## 2014-02-14 ENCOUNTER — Other Ambulatory Visit: Payer: Self-pay | Admitting: Nurse Practitioner

## 2014-02-14 ENCOUNTER — Other Ambulatory Visit: Payer: Self-pay

## 2014-02-14 DIAGNOSIS — F411 Generalized anxiety disorder: Secondary | ICD-10-CM

## 2014-02-14 MED ORDER — LORAZEPAM 1 MG PO TABS: ORAL_TABLET | ORAL | Status: DC

## 2014-02-14 NOTE — Telephone Encounter (Signed)
Last seen 10/11/13  MMM If approved route to nurse to call into [facility]

## 2014-02-14 NOTE — Telephone Encounter (Signed)
Please call in ativan with 0 refills 

## 2014-02-15 NOTE — Telephone Encounter (Signed)
Phoned in.

## 2014-03-07 ENCOUNTER — Telehealth: Payer: Self-pay | Admitting: Pharmacist

## 2014-03-08 NOTE — Telephone Encounter (Signed)
Paperwork to verify sliding scale was faxed to [facility] on 02/11/2014.  Notified Devina at [facility]

## 2014-03-15 ENCOUNTER — Other Ambulatory Visit: Payer: Self-pay | Admitting: Nurse Practitioner

## 2014-03-15 NOTE — Telephone Encounter (Signed)
Last seen 10/11/13, last filled 02/15/14. Call into [facility]

## 2014-03-15 NOTE — Telephone Encounter (Signed)
Please call in ativan with 1 refills 

## 2014-03-16 NOTE — Telephone Encounter (Signed)
rx called into pharmacy

## 2014-04-01 ENCOUNTER — Ambulatory Visit (INDEPENDENT_AMBULATORY_CARE_PROVIDER_SITE_OTHER): Payer: Medicare HMO | Admitting: Ophthalmology

## 2014-04-03 ENCOUNTER — Ambulatory Visit (INDEPENDENT_AMBULATORY_CARE_PROVIDER_SITE_OTHER): Payer: Medicare HMO | Admitting: Ophthalmology

## 2014-04-10 ENCOUNTER — Ambulatory Visit (INDEPENDENT_AMBULATORY_CARE_PROVIDER_SITE_OTHER): Payer: Medicare HMO | Admitting: Ophthalmology

## 2014-05-01 ENCOUNTER — Telehealth: Payer: Self-pay | Admitting: Pharmacist

## 2014-05-01 ENCOUNTER — Ambulatory Visit (INDEPENDENT_AMBULATORY_CARE_PROVIDER_SITE_OTHER): Payer: Medicare HMO | Admitting: Ophthalmology

## 2014-05-01 NOTE — Telephone Encounter (Signed)
Patient is due follow up diabetes / labs / chronic conditions and also needs AWV.   Tried to call - cell # was husbands #.  Left message with husband for patient to call office for appt.

## 2014-05-09 ENCOUNTER — Other Ambulatory Visit: Payer: Self-pay | Admitting: Nurse Practitioner

## 2014-05-13 ENCOUNTER — Other Ambulatory Visit: Payer: Self-pay | Admitting: *Deleted

## 2014-05-13 NOTE — Telephone Encounter (Signed)
Script for Ativan refills called to [facility].

## 2014-05-13 NOTE — Telephone Encounter (Signed)
Please call in ativan with 1 refills 

## 2014-05-13 NOTE — Telephone Encounter (Signed)
Last seen 09/2013, last filled 04/17/14. Pt uses [facility]

## 2014-05-16 ENCOUNTER — Ambulatory Visit (INDEPENDENT_AMBULATORY_CARE_PROVIDER_SITE_OTHER): Payer: Medicare HMO | Admitting: Ophthalmology

## 2014-05-16 DIAGNOSIS — H35033 Hypertensive retinopathy, bilateral: Secondary | ICD-10-CM | POA: Diagnosis not present

## 2014-05-16 DIAGNOSIS — E11329 Type 2 diabetes mellitus with mild nonproliferative diabetic retinopathy without macular edema: Secondary | ICD-10-CM | POA: Diagnosis not present

## 2014-05-16 DIAGNOSIS — E11319 Type 2 diabetes mellitus with unspecified diabetic retinopathy without macular edema: Secondary | ICD-10-CM | POA: Diagnosis not present

## 2014-05-16 DIAGNOSIS — H43813 Vitreous degeneration, bilateral: Secondary | ICD-10-CM

## 2014-05-16 DIAGNOSIS — I10 Essential (primary) hypertension: Secondary | ICD-10-CM

## 2014-05-16 LAB — HM DIABETES EYE EXAM

## 2014-05-27 ENCOUNTER — Encounter: Payer: Self-pay | Admitting: *Deleted

## 2014-05-30 ENCOUNTER — Ambulatory Visit: Payer: Medicare HMO

## 2014-07-01 ENCOUNTER — Other Ambulatory Visit: Payer: Self-pay | Admitting: Nurse Practitioner

## 2014-07-02 ENCOUNTER — Other Ambulatory Visit: Payer: Self-pay

## 2014-07-02 DIAGNOSIS — E781 Pure hyperglyceridemia: Secondary | ICD-10-CM

## 2014-07-02 MED ORDER — FENOFIBRATE 160 MG PO TABS: ORAL_TABLET | ORAL | Status: DC

## 2014-07-09 ENCOUNTER — Telehealth: Payer: Self-pay

## 2014-07-09 NOTE — Telephone Encounter (Signed)
LMOM to call about DEXA

## 2014-07-15 DIAGNOSIS — Z1231 Encounter for screening mammogram for malignant neoplasm of breast: Secondary | ICD-10-CM | POA: Diagnosis not present

## 2014-07-18 ENCOUNTER — Other Ambulatory Visit: Payer: Self-pay | Admitting: Nurse Practitioner

## 2014-07-19 ENCOUNTER — Other Ambulatory Visit: Payer: Self-pay | Admitting: *Deleted

## 2014-07-19 MED ORDER — SERTRALINE HCL 100 MG PO TABS: 100.0000 mg | ORAL_TABLET | Freq: Every day | ORAL | Status: DC

## 2014-07-19 NOTE — Telephone Encounter (Signed)
Last seen 10/12/14 MMM If approved route to nurse to call into [facility]

## 2014-07-19 NOTE — Telephone Encounter (Signed)
Please call in ativan with 1 refills Patient NTBS for follow up and lab work

## 2014-07-19 NOTE — Telephone Encounter (Signed)
Refill called to pharmacy.

## 2014-07-30 ENCOUNTER — Other Ambulatory Visit: Payer: Self-pay | Admitting: Family Medicine

## 2014-07-30 NOTE — Telephone Encounter (Signed)
Last seen 09/2013

## 2014-07-30 NOTE — Telephone Encounter (Signed)
Refilled denied until seen for labs

## 2014-08-05 ENCOUNTER — Other Ambulatory Visit: Payer: Self-pay | Admitting: Family Medicine

## 2014-08-07 ENCOUNTER — Telehealth: Payer: Self-pay | Admitting: Physician Assistant

## 2014-08-07 DIAGNOSIS — E119 Type 2 diabetes mellitus without complications: Secondary | ICD-10-CM

## 2014-08-07 DIAGNOSIS — E785 Hyperlipidemia, unspecified: Secondary | ICD-10-CM

## 2014-08-07 DIAGNOSIS — I10 Essential (primary) hypertension: Secondary | ICD-10-CM

## 2014-08-07 DIAGNOSIS — E781 Pure hyperglyceridemia: Secondary | ICD-10-CM

## 2014-08-07 NOTE — Telephone Encounter (Signed)
Orders placed and ready for patient when she comes in to office.

## 2014-08-08 MED ORDER — FENOFIBRATE 160 MG PO TABS: ORAL_TABLET | ORAL | Status: DC

## 2014-08-08 NOTE — Telephone Encounter (Signed)
done

## 2014-08-08 NOTE — Telephone Encounter (Signed)
Pt notified order entered in [employer] understanding

## 2014-08-09 ENCOUNTER — Ambulatory Visit (INDEPENDENT_AMBULATORY_CARE_PROVIDER_SITE_OTHER): Payer: Commercial Managed Care - HMO | Admitting: Physician Assistant

## 2014-08-09 ENCOUNTER — Encounter (INDEPENDENT_AMBULATORY_CARE_PROVIDER_SITE_OTHER): Payer: Self-pay

## 2014-08-09 ENCOUNTER — Other Ambulatory Visit: Payer: Self-pay | Admitting: Physician Assistant

## 2014-08-09 ENCOUNTER — Encounter: Payer: Self-pay | Admitting: Physician Assistant

## 2014-08-09 VITALS — BP 154/64 | HR 89 | Temp 98.2°F | Ht 64.0 in | Wt 198.0 lb

## 2014-08-09 DIAGNOSIS — E119 Type 2 diabetes mellitus without complications: Secondary | ICD-10-CM

## 2014-08-09 DIAGNOSIS — E785 Hyperlipidemia, unspecified: Secondary | ICD-10-CM | POA: Diagnosis not present

## 2014-08-09 DIAGNOSIS — E1069 Type 1 diabetes mellitus with other specified complication: Secondary | ICD-10-CM

## 2014-08-09 DIAGNOSIS — E781 Pure hyperglyceridemia: Secondary | ICD-10-CM | POA: Diagnosis not present

## 2014-08-09 DIAGNOSIS — E108 Type 1 diabetes mellitus with unspecified complications: Secondary | ICD-10-CM

## 2014-08-09 DIAGNOSIS — F32A Depression, unspecified: Secondary | ICD-10-CM

## 2014-08-09 DIAGNOSIS — I10 Essential (primary) hypertension: Secondary | ICD-10-CM

## 2014-08-09 DIAGNOSIS — G4730 Sleep apnea, unspecified: Principal | ICD-10-CM

## 2014-08-09 DIAGNOSIS — F329 Major depressive disorder, single episode, unspecified: Secondary | ICD-10-CM

## 2014-08-09 DIAGNOSIS — IMO0002 Reserved for concepts with insufficient information to code with codable children: Secondary | ICD-10-CM

## 2014-08-09 DIAGNOSIS — E1065 Type 1 diabetes mellitus with hyperglycemia: Secondary | ICD-10-CM

## 2014-08-09 DIAGNOSIS — G4710 Hypersomnia, unspecified: Secondary | ICD-10-CM

## 2014-08-09 LAB — POCT GLYCOSYLATED HEMOGLOBIN (HGB A1C): Hemoglobin A1C: 9.1

## 2014-08-09 MED ORDER — LISINOPRIL 20 MG PO TABS: 20.0000 mg | ORAL_TABLET | Freq: Every day | ORAL | Status: DC

## 2014-08-09 MED ORDER — FENOFIBRATE 160 MG PO TABS: ORAL_TABLET | ORAL | Status: DC

## 2014-08-09 MED ORDER — INSULIN DETEMIR 100 UNIT/ML ~~LOC~~ SOLN: 60.0000 [IU] | Freq: Two times a day (BID) | SUBCUTANEOUS | Status: DC

## 2014-08-09 MED ORDER — FUROSEMIDE 20 MG PO TABS: 20.0000 mg | ORAL_TABLET | Freq: Every day | ORAL | Status: DC

## 2014-08-09 MED ORDER — SERTRALINE HCL 100 MG PO TABS: 100.0000 mg | ORAL_TABLET | Freq: Every day | ORAL | Status: DC

## 2014-08-09 MED ORDER — VITAMIN D 1000 UNITS PO TABS: 1000.0000 [IU] | ORAL_TABLET | Freq: Every day | ORAL | Status: DC

## 2014-08-09 MED ORDER — METFORMIN HCL 500 MG PO TABS: 500.0000 mg | ORAL_TABLET | Freq: Two times a day (BID) | ORAL | Status: DC

## 2014-08-09 MED ORDER — INSULIN REGULAR HUMAN 100 UNIT/ML IJ SOLN: INTRAMUSCULAR | Status: DC

## 2014-08-09 NOTE — Progress Notes (Signed)
Subjective:     Patient ID: Alexandra Hunt, female   DOB: [birth year], 69 y.o.   MRN: [id number]  HPI Pt here for f/u of multiple chronic conditions She has not bee doing well with her BS She thinks this is from stress and not watching her diet well enough Pt with 2 older children at home who are both meth addicts She is having to take care of her husband and 59 y/o grandson Due to this she has not been taking care of herself as she should Several times she has called the sheriff but they cannot really do anything to help She denies CP, SOB, or  Lower ext edema She is doing nightly foot exams Eye exam is up to date  Review of Systems  Constitutional: Positive for activity change and fatigue. Negative for appetite change.  HENT: Negative.   Respiratory: Negative.   Cardiovascular: Negative.   Gastrointestinal: Negative.   Musculoskeletal: Positive for arthralgias.  Psychiatric/Behavioral: Positive for sleep disturbance, decreased concentration and agitation. Negative for suicidal ideas, behavioral problems and confusion. The patient is not nervous/anxious.        Objective:   Physical Exam  Constitutional: She appears well-developed and well-nourished.  HENT:  Mouth/Throat: Oropharynx is clear and moist. No oropharyngeal exudate.  Neck: Neck supple. No JVD present. No thyromegaly present.  Cardiovascular: Normal rate and regular rhythm.   Murmur heard. Pulmonary/Chest: Effort normal and breath sounds normal.  Musculoskeletal:  No lower ext edema, ulcerations, or skin breakdown  Lymphadenopathy:    She has no cervical adenopathy.  Psychiatric:  Pt with flat depressed type mood  Nursing note and vitals reviewed.      Assessment:     Depression  Type 2 diabetes mellitus without complication - Plan: POCT glycosylated hemoglobin (Hb A1C)  Essential hypertension, malignant - Plan: CMP14+EGFR  Hyperlipemia - Plan: Lipid panel  Essential hypertension, benign - Plan:  lisinopril (PRINIVIL,ZESTRIL) 20 MG tablet  Type I diabetes mellitus with complication, uncontrolled - Plan: insulin regular (HUMULIN R) 100 units/mL injection, insulin detemir (LEVEMIR) 100 UNIT/ML injection  Hypertriglyceridemia - Plan: fenofibrate 160 MG tablet      Plan:     Pt to do better with diet and exercise Full labs pending will inform with results Continue with nightly foot checks Meds rf x 3 months only RX for Ultram 50 mg #24 1 po qid prn  No rf for her bilat knee pain Pt also thinks her CPAP machine is not working correctly so will make f/u with Dr Annamaria Boots Pt to f/u in 3 months due to poor A1C

## 2014-08-09 NOTE — Patient Instructions (Signed)

## 2014-08-09 NOTE — Addendum Note (Signed)
Addended by: Ilean China on: 08/09/2014 05:30 PM   Modules accepted: Orders

## 2014-08-10 LAB — CMP14+EGFR
ALBUMIN: 4 g/dL (ref 3.6–4.8)
ALK PHOS: 83 IU/L (ref 39–117)
ALT: 14 IU/L (ref 0–32)
AST: 12 IU/L (ref 0–40)
Albumin/Globulin Ratio: 1.2 (ref 1.1–2.5)
BUN/Creatinine Ratio: 21 (ref 11–26)
BUN: 18 mg/dL (ref 8–27)
CHLORIDE: 103 mmol/L (ref 97–108)
CO2: 20 mmol/L (ref 18–29)
Calcium: 9.7 mg/dL (ref 8.7–10.3)
Creatinine, Ser: 0.87 mg/dL (ref 0.57–1.00)
GFR calc non Af Amer: 70 mL/min/{1.73_m2} (ref >59)
GFR, EST AFRICAN AMERICAN: 80 mL/min/{1.73_m2} (ref >59)
Globulin, Total: 3.3 g/dL (ref 1.5–4.5)
Glucose: 198 mg/dL — ABNORMAL HIGH (ref 65–99)
Potassium: 4.3 mmol/L (ref 3.5–5.2)
Sodium: 141 mmol/L (ref 134–144)
TOTAL PROTEIN: 7.3 g/dL (ref 6.0–8.5)

## 2014-08-10 LAB — LIPID PANEL
CHOL/HDL RATIO: 5.5 ratio — AB (ref 0.0–4.4)
CHOLESTEROL TOTAL: 170 mg/dL (ref 100–199)
HDL: 31 mg/dL — ABNORMAL LOW (ref >39)
Triglycerides: 452 mg/dL — ABNORMAL HIGH (ref 0–149)

## 2014-08-14 ENCOUNTER — Telehealth: Payer: Self-pay | Admitting: *Deleted

## 2014-08-14 NOTE — Telephone Encounter (Signed)
Aware of lab results and copy mailed .

## 2014-08-21 ENCOUNTER — Ambulatory Visit: Payer: Commercial Managed Care - HMO | Admitting: *Deleted

## 2014-08-28 ENCOUNTER — Other Ambulatory Visit: Payer: Self-pay | Admitting: Nurse Practitioner

## 2014-08-28 NOTE — Telephone Encounter (Signed)
Please review and advise.

## 2014-08-28 NOTE — Telephone Encounter (Signed)
Needs to get from her regular provider

## 2014-08-29 NOTE — Telephone Encounter (Signed)
Refill called to pharmacy.

## 2014-08-29 NOTE — Telephone Encounter (Signed)
Last seen 08/09/14 WLW  If approved route to nurse to call into [facility]

## 2014-08-29 NOTE — Telephone Encounter (Signed)
Please call in ativan with 1 refills 

## 2014-09-04 DIAGNOSIS — H2512 Age-related nuclear cataract, left eye: Secondary | ICD-10-CM | POA: Diagnosis not present

## 2014-09-28 DIAGNOSIS — I10 Essential (primary) hypertension: Secondary | ICD-10-CM | POA: Diagnosis not present

## 2014-09-28 DIAGNOSIS — Z794 Long term (current) use of insulin: Secondary | ICD-10-CM | POA: Diagnosis not present

## 2014-09-28 DIAGNOSIS — R200 Anesthesia of skin: Secondary | ICD-10-CM | POA: Diagnosis not present

## 2014-09-28 DIAGNOSIS — Z7982 Long term (current) use of aspirin: Secondary | ICD-10-CM | POA: Diagnosis not present

## 2014-09-28 DIAGNOSIS — F329 Major depressive disorder, single episode, unspecified: Secondary | ICD-10-CM | POA: Diagnosis not present

## 2014-09-28 DIAGNOSIS — F419 Anxiety disorder, unspecified: Secondary | ICD-10-CM | POA: Diagnosis not present

## 2014-09-28 DIAGNOSIS — Z79899 Other long term (current) drug therapy: Secondary | ICD-10-CM | POA: Diagnosis not present

## 2014-09-28 DIAGNOSIS — M5032 Other cervical disc degeneration, mid-cervical region: Secondary | ICD-10-CM | POA: Diagnosis not present

## 2014-09-28 DIAGNOSIS — H6981 Other specified disorders of Eustachian tube, right ear: Secondary | ICD-10-CM | POA: Diagnosis not present

## 2014-09-28 DIAGNOSIS — E119 Type 2 diabetes mellitus without complications: Secondary | ICD-10-CM | POA: Diagnosis not present

## 2014-09-28 DIAGNOSIS — M5412 Radiculopathy, cervical region: Secondary | ICD-10-CM | POA: Diagnosis not present

## 2014-10-01 DIAGNOSIS — M5010 Cervical disc disorder with radiculopathy, unspecified cervical region: Secondary | ICD-10-CM | POA: Diagnosis not present

## 2014-10-01 DIAGNOSIS — N189 Chronic kidney disease, unspecified: Secondary | ICD-10-CM | POA: Diagnosis not present

## 2014-10-01 DIAGNOSIS — M130 Polyarthritis, unspecified: Secondary | ICD-10-CM | POA: Diagnosis not present

## 2014-10-01 DIAGNOSIS — E119 Type 2 diabetes mellitus without complications: Secondary | ICD-10-CM | POA: Diagnosis not present

## 2014-10-01 DIAGNOSIS — I10 Essential (primary) hypertension: Secondary | ICD-10-CM | POA: Diagnosis not present

## 2014-10-01 DIAGNOSIS — R5383 Other fatigue: Secondary | ICD-10-CM | POA: Diagnosis not present

## 2014-10-01 DIAGNOSIS — G4700 Insomnia, unspecified: Secondary | ICD-10-CM | POA: Diagnosis not present

## 2014-10-09 ENCOUNTER — Other Ambulatory Visit: Payer: Self-pay | Admitting: Nurse Practitioner

## 2014-10-10 NOTE — Telephone Encounter (Signed)
Last seen 08/09/14  WLW  IF approved route to nurse to call into [facility]

## 2014-10-10 NOTE — Telephone Encounter (Signed)
Please call in ativan with 0 refills Patient NTBS for follow up and lab work

## 2014-10-10 NOTE — Telephone Encounter (Signed)
rx called to pharmacy 

## 2014-10-19 ENCOUNTER — Other Ambulatory Visit: Payer: Self-pay | Admitting: Nurse Practitioner

## 2014-10-29 ENCOUNTER — Encounter: Payer: Self-pay | Admitting: Internal Medicine

## 2014-10-31 ENCOUNTER — Other Ambulatory Visit: Payer: Self-pay | Admitting: Nurse Practitioner

## 2014-11-11 ENCOUNTER — Ambulatory Visit: Payer: Commercial Managed Care - HMO | Admitting: Family Medicine

## 2014-11-12 ENCOUNTER — Encounter: Payer: Self-pay | Admitting: Family Medicine

## 2014-11-26 ENCOUNTER — Ambulatory Visit (INDEPENDENT_AMBULATORY_CARE_PROVIDER_SITE_OTHER): Payer: Commercial Managed Care - HMO | Admitting: Physician Assistant

## 2014-11-26 ENCOUNTER — Encounter: Payer: Self-pay | Admitting: Physician Assistant

## 2014-11-26 ENCOUNTER — Encounter (INDEPENDENT_AMBULATORY_CARE_PROVIDER_SITE_OTHER): Payer: Self-pay

## 2014-11-26 VITALS — BP 135/60 | HR 103 | Temp 97.8°F | Ht 64.0 in | Wt 193.0 lb

## 2014-11-26 DIAGNOSIS — L298 Other pruritus: Secondary | ICD-10-CM

## 2014-11-26 DIAGNOSIS — B373 Candidiasis of vulva and vagina: Secondary | ICD-10-CM | POA: Diagnosis not present

## 2014-11-26 DIAGNOSIS — N3090 Cystitis, unspecified without hematuria: Secondary | ICD-10-CM | POA: Diagnosis not present

## 2014-11-26 DIAGNOSIS — N898 Other specified noninflammatory disorders of vagina: Secondary | ICD-10-CM

## 2014-11-26 DIAGNOSIS — R399 Unspecified symptoms and signs involving the genitourinary system: Secondary | ICD-10-CM | POA: Diagnosis not present

## 2014-11-26 DIAGNOSIS — B3731 Acute candidiasis of vulva and vagina: Secondary | ICD-10-CM

## 2014-11-26 LAB — POCT URINALYSIS DIPSTICK
Bilirubin, UA: NEGATIVE
Glucose, UA: 250
Ketones, UA: NEGATIVE
Nitrite, UA: NEGATIVE
PH UA: 6
PROTEIN UA: NEGATIVE
Spec Grav, UA: 1.01
Urobilinogen, UA: NEGATIVE

## 2014-11-26 LAB — POCT UA - MICROSCOPIC ONLY
CASTS, UR, LPF, POC: NEGATIVE
Crystals, Ur, HPF, POC: NEGATIVE
Mucus, UA: NEGATIVE
Yeast, UA: NEGATIVE

## 2014-11-26 LAB — POCT WET PREP (WET MOUNT)
KOH Wet Prep POC: POSITIVE
Trichomonas Wet Prep HPF POC: NEGATIVE

## 2014-11-26 MED ORDER — CIPROFLOXACIN HCL 500 MG PO TABS
500.0000 mg | ORAL_TABLET | Freq: Two times a day (BID) | ORAL | Status: DC
Start: 2014-11-26 — End: 2014-12-20

## 2014-11-26 MED ORDER — FLUCONAZOLE 150 MG PO TABS
ORAL_TABLET | ORAL | Status: DC
Start: 2014-11-26 — End: 2014-12-10

## 2014-11-26 NOTE — Progress Notes (Signed)
   Subjective:    Patient ID: GEETIKA LABORDE, female    DOB: 1945/10/05, 69 y.o.   MRN: [id number]  HPI 69 y/o female presents with c/o burning with urination, lower abdominal pain and vaginal itching x 5 days.     Review of Systems  Constitutional: Negative.   Gastrointestinal: Positive for abdominal pain.  Endocrine: Positive for polyuria.  Genitourinary: Positive for dysuria, urgency, frequency, hematuria and pelvic pain.  Musculoskeletal: Positive for back pain.       Objective:   Physical Exam  Results for orders placed or performed in visit on 11/26/14  POCT urinalysis dipstick  Result Value Ref Range   Color, UA yellow    Clarity, UA cloudy    Glucose, UA 250    Bilirubin, UA neg    Ketones, UA neg    Spec Grav, UA 1.010    Blood, UA mod    pH, UA 6.0    Protein, UA neg    Urobilinogen, UA negative    Nitrite, UA neg    Leukocytes, UA large (3+) (A) Negative  POCT UA - Microscopic Only  Result Value Ref Range   WBC, Ur, HPF, POC 20-30    RBC, urine, microscopic 10-15    Bacteria, U Microscopic occ    Mucus, UA neg    Epithelial cells, urine per micros many    Crystals, Ur, HPF, POC neg    Casts, Ur, LPF, POC neg    Yeast, UA neg   POCT Wet Prep ([place])  Result Value Ref Range   Source Wet Prep POC vaginal    WBC, Wet Prep HPF POC 30-40    Bacteria Wet Prep HPF POC Moderate (A) None, Few   Clue Cells Wet Prep HPF POC None None   Yeast Wet Prep HPF POC Moderate    KOH Wet Prep POC pos    Trichomonas Wet Prep HPF POC neg          Assessment & Plan:  1. UTI symptoms  - POCT urinalysis dipstick - POCT UA - Microscopic Only - Urine culture - ciprofloxacin (CIPRO) 500 MG tablet; Take 1 tablet (500 mg total) by mouth 2 (two) times daily.  Dispense: 20 tablet; Refill: 0  2. Vaginal itching  - POCT Wet Prep [facility]) - Urine culture - ciprofloxacin (CIPRO) 500 MG tablet; Take 1 tablet (500 mg total) by mouth 2 (two) times daily.  Dispense: 20  tablet; Refill: 0  3. Cystitis  - Urine culture - ciprofloxacin (CIPRO) 500 MG tablet; Take 1 tablet (500 mg total) by mouth 2 (two) times daily.  Dispense: 20 tablet; Refill: 0  4. Vulvovaginal candidiasis  - fluconazole (DIFLUCAN) 150 MG tablet; Take 1 tablet PO on day 1. Repeat in 3 days  Dispense: 2 tablet; Refill: 0   RTC prn   Krysteena Stalker A. Benjamin Stain PA-C

## 2014-11-28 LAB — URINE CULTURE

## 2014-12-10 ENCOUNTER — Other Ambulatory Visit: Payer: Self-pay | Admitting: Physician Assistant

## 2014-12-10 NOTE — Telephone Encounter (Signed)
Last seen 11/26/14  Tiffany

## 2014-12-16 ENCOUNTER — Institutional Professional Consult (permissible substitution): Payer: Self-pay | Admitting: Internal Medicine

## 2014-12-20 ENCOUNTER — Ambulatory Visit (INDEPENDENT_AMBULATORY_CARE_PROVIDER_SITE_OTHER): Payer: Commercial Managed Care - HMO | Admitting: Nurse Practitioner

## 2014-12-20 ENCOUNTER — Encounter: Payer: Self-pay | Admitting: Nurse Practitioner

## 2014-12-20 VITALS — BP 116/51 | HR 117 | Temp 96.9°F | Ht 64.0 in | Wt 192.0 lb

## 2014-12-20 DIAGNOSIS — G4730 Sleep apnea, unspecified: Secondary | ICD-10-CM | POA: Diagnosis not present

## 2014-12-20 DIAGNOSIS — E781 Pure hyperglyceridemia: Secondary | ICD-10-CM | POA: Diagnosis not present

## 2014-12-20 DIAGNOSIS — Z6832 Body mass index (BMI) 32.0-32.9, adult: Secondary | ICD-10-CM | POA: Diagnosis not present

## 2014-12-20 DIAGNOSIS — I10 Essential (primary) hypertension: Secondary | ICD-10-CM | POA: Diagnosis not present

## 2014-12-20 DIAGNOSIS — E1065 Type 1 diabetes mellitus with hyperglycemia: Secondary | ICD-10-CM

## 2014-12-20 DIAGNOSIS — Z23 Encounter for immunization: Secondary | ICD-10-CM | POA: Diagnosis not present

## 2014-12-20 DIAGNOSIS — Z1159 Encounter for screening for other viral diseases: Secondary | ICD-10-CM

## 2014-12-20 DIAGNOSIS — Z1212 Encounter for screening for malignant neoplasm of rectum: Secondary | ICD-10-CM

## 2014-12-20 DIAGNOSIS — E119 Type 2 diabetes mellitus without complications: Secondary | ICD-10-CM | POA: Diagnosis not present

## 2014-12-20 DIAGNOSIS — IMO0002 Reserved for concepts with insufficient information to code with codable children: Secondary | ICD-10-CM

## 2014-12-20 DIAGNOSIS — F329 Major depressive disorder, single episode, unspecified: Secondary | ICD-10-CM | POA: Diagnosis not present

## 2014-12-20 DIAGNOSIS — F32A Depression, unspecified: Secondary | ICD-10-CM

## 2014-12-20 DIAGNOSIS — E108 Type 1 diabetes mellitus with unspecified complications: Secondary | ICD-10-CM

## 2014-12-20 DIAGNOSIS — F411 Generalized anxiety disorder: Secondary | ICD-10-CM | POA: Diagnosis not present

## 2014-12-20 DIAGNOSIS — E785 Hyperlipidemia, unspecified: Secondary | ICD-10-CM | POA: Diagnosis not present

## 2014-12-20 DIAGNOSIS — E1142 Type 2 diabetes mellitus with diabetic polyneuropathy: Secondary | ICD-10-CM | POA: Insufficient documentation

## 2014-12-20 LAB — POCT GLYCOSYLATED HEMOGLOBIN (HGB A1C): Hemoglobin A1C: 8.8

## 2014-12-20 MED ORDER — METFORMIN HCL 500 MG PO TABS: 500.0000 mg | ORAL_TABLET | Freq: Two times a day (BID) | ORAL | Status: DC

## 2014-12-20 MED ORDER — INSULIN REGULAR HUMAN 100 UNIT/ML IJ SOLN: INTRAMUSCULAR | Status: DC

## 2014-12-20 MED ORDER — FENOFIBRATE 160 MG PO TABS: ORAL_TABLET | ORAL | Status: DC

## 2014-12-20 MED ORDER — LISINOPRIL 20 MG PO TABS: 20.0000 mg | ORAL_TABLET | Freq: Every day | ORAL | Status: DC

## 2014-12-20 MED ORDER — INSULIN DETEMIR 100 UNIT/ML ~~LOC~~ SOLN: SUBCUTANEOUS | Status: DC

## 2014-12-20 MED ORDER — LORAZEPAM 1 MG PO TABS: 1.0000 mg | ORAL_TABLET | Freq: Two times a day (BID) | ORAL | Status: DC | PRN

## 2014-12-20 MED ORDER — SERTRALINE HCL 100 MG PO TABS: 100.0000 mg | ORAL_TABLET | Freq: Every day | ORAL | Status: DC

## 2014-12-20 NOTE — Progress Notes (Signed)
Subjective:    Patient ID: Alexandra Hunt, female    DOB: 02/09/1946, 69 y.o.   MRN: [id number]  Patient here today for follow up of chronic medical problems.   Diabetes She presents for her follow-up diabetic visit. She has type 1 diabetes mellitus. No MedicAlert identification noted. Her disease course has been fluctuating. Hypoglycemia symptoms include headaches. Associated symptoms include foot paresthesias and weakness. Pertinent negatives for diabetes include no foot ulcerations. There are no hypoglycemic complications. Diabetic complications include nephropathy, peripheral neuropathy and retinopathy. Pertinent negatives for diabetic complications include no CVA. Risk factors for coronary artery disease include diabetes mellitus, dyslipidemia, hypertension and post-menopausal. She is compliant with treatment some of the time. Her weight is stable. When asked about meal planning, she reported none. She has not had a previous visit with a dietitian. She rarely participates in exercise. Her home blood glucose trend is fluctuating dramatically. Her breakfast blood glucose is taken between 8-9 am. Her breakfast blood glucose range is generally 180-200 mg/dl. Her highest blood glucose is >200 mg/dl. Her overall blood glucose range is 180-200 mg/dl. An ACE inhibitor/angiotensin II receptor blocker is being taken. She does not see a podiatrist.Eye exam is not current.  Hypertension This is a chronic problem. The problem has been waxing and waning since onset. The problem is resistant. Associated symptoms include headaches. Pertinent negatives include no malaise/fatigue or peripheral edema. Risk factors for coronary artery disease include diabetes mellitus, dyslipidemia and post-menopausal state. Past treatments include ACE inhibitors. Hypertensive end-organ damage includes retinopathy. There is no history of CVA.  Hyperlipidemia This is a chronic problem. The current episode started more than 1 year ago.  The problem is controlled. Recent lipid tests were reviewed and are variable. Exacerbating diseases include diabetes. Current antihyperlipidemic treatment includes statins. The current treatment provides mild improvement of lipids. Compliance problems include adherence to diet and adherence to exercise.  Risk factors for coronary artery disease include diabetes mellitus, dyslipidemia and post-menopausal.  Depression/GAD Currently taking Zoloft $RemoveBefor'100mg'ToKfOSEUGTfJ$  qd and Ativan $RemoveBe'1mg'MyVenVKSh$  bid.  Still having issues of stress and anxiety at times.  Her grandkids are moving in with her this fall. This is going to increase her stress level. Sleep apnea Not using cpap machine- has appointment in November with pulmonologist Review of Systems  Constitutional: Negative for malaise/fatigue.  Respiratory: Negative.   Cardiovascular: Negative.   Skin: Negative.   Neurological: Positive for weakness and headaches.  Psychiatric/Behavioral: Negative.   All other systems reviewed and are negative.      Objective:   Physical Exam  Constitutional: She is oriented to person, place, and time. She appears well-developed and well-nourished.  HENT:  Nose: Nose normal.  Mouth/Throat: Oropharynx is clear and moist.  Eyes: EOM are normal.  Neck: Trachea normal, normal range of motion and full passive range of motion without pain. Neck supple. No JVD present. Carotid bruit is not present. No thyromegaly present.  Cardiovascular: Normal rate, regular rhythm, normal heart sounds and intact distal pulses.  Exam reveals no gallop and no friction rub.   No murmur heard. Pulmonary/Chest: Effort normal and breath sounds normal.  Abdominal: Soft. Bowel sounds are normal. She exhibits no distension and no mass. There is no tenderness.  Musculoskeletal: Normal range of motion.       Arms: Lymphadenopathy:    She has no cervical adenopathy.  Neurological: She is alert and oriented to person, place, and time. She has normal reflexes.  Skin:  Skin is warm and dry.  4/4 bilateral  foot monofilament  Psychiatric: She has a normal mood and affect. Her behavior is normal. Judgment and thought content normal.    BP 116/51 mmHg  Pulse 117  Temp(Src) 96.9 F (36.1 C) (Oral)  Ht $R'5\' 4"'kX$  (1.626 m)  Wt 192 lb (87.091 kg)  BMI 32.94 kg/m2 Results for orders placed or performed in visit on 12/20/14  POCT glycosylated hemoglobin (Hb A1C)  Result Value Ref Range   Hemoglobin A1C 8.8        Assessment & Plan:  1. Essential hypertension, malignant Do not add salt to diet - CMP14+EGFR - lisinopril (PRINIVIL,ZESTRIL) 20 MG tablet; Take 1 tablet (20 mg total) by mouth daily.  Dispense: 90 tablet; Refill: 5  2. Hyperlipemia Low fat diet - Lipid panel  3. GAD (generalized anxiety disorder) Stress management - LORazepam (ATIVAN) 1 MG tablet; Take 1 tablet (1 mg total) by mouth 2 (two) times daily as needed.  Dispense: 60 tablet; Refill: 0  4. Depression - sertraline (ZOLOFT) 100 MG tablet; Take 1 tablet (100 mg total) by mouth daily.  Dispense: 30 tablet; Refill: 5  5. Sleep apnea Bedtime ritual  6. BMI 32.0-32.9,adult Discussed diet and exercise for person with BMI >25 Will recheck weight in 3-6 months   7. Hypertriglyceridemia Low carb diet - fenofibrate 160 MG tablet; TAKE ONE TABLET BY MOUTH ONE TIME DAILY  Dispense: 30 tablet; Refill: 3  8. Type I diabetes mellitus with complication, uncontrolled ([place]) patient wants to try diet before changing meds  - POCT glycosylated hemoglobin (Hb A1C) - insulin regular (HUMULIN R) 100 units/mL injection; Sliding scale: less than 80 (6u): 80-120 (11u): 121-150 (12u): 151-200 (13u): 210-250 (14u): 251-300 (16u): over 300 (18u)  Dispense: 30 mL; Refill: 5 - metFORMIN (GLUCOPHAGE) 500 MG tablet; Take 1 tablet (500 mg total) by mouth 2 (two) times daily with a meal.  Dispense: 60 tablet; Refill: 5 - insulin detemir (LEVEMIR) 100 UNIT/ML injection; INJECT 60 UNITS UNDER THE SKIN TWICE A DAY  AS INSTRUCTED  Dispense: 30 mL; Refill: 5 - Microalbumin / creatinine urine ratio  9. Screening for malignant neoplasm of the rectum - Fecal occult blood, imunochemical; Future  10. Need for hepatitis C screening test - Hepatitis C antibody    Labs pending Health maintenance reviewed Diet and exercise encouraged Continue all meds Follow up  In 3 month   Pablo Pena, FNP

## 2014-12-20 NOTE — Patient Instructions (Signed)
Diabetes and Foot Care Diabetes may cause you to have problems because of poor blood supply (circulation) to your feet and legs. This may cause the skin on your feet to become thinner, break easier, and heal more slowly. Your skin may become dry, and the skin may peel and crack. You may also have nerve damage in your legs and feet causing decreased feeling in them. You may not notice minor injuries to your feet that could lead to infections or more serious problems. Taking care of your feet is one of the most important things you can do for yourself.  HOME CARE INSTRUCTIONS  Wear shoes at all times, even in the house. Do not go barefoot. Bare feet are easily injured.  Check your feet daily for blisters, cuts, and redness. If you cannot see the bottom of your feet, use a mirror or ask someone for help.  Wash your feet with warm water (do not use hot water) and mild soap. Then pat your feet and the areas between your toes until they are completely dry. Do not soak your feet as this can dry your skin.  Apply a moisturizing lotion or petroleum jelly (that does not contain alcohol and is unscented) to the skin on your feet and to dry, brittle toenails. Do not apply lotion between your toes.  Trim your toenails straight across. Do not dig under them or around the cuticle. File the edges of your nails with an emery board or nail file.  Do not cut corns or calluses or try to remove them with medicine.  Wear clean socks or stockings every day. Make sure they are not too tight. Do not wear knee-high stockings since they may decrease blood flow to your legs.  Wear shoes that fit properly and have enough cushioning. To break in new shoes, wear them for just a few hours a day. This prevents you from injuring your feet. Always look in your shoes before you put them on to be sure there are no objects inside.  Do not cross your legs. This may decrease the blood flow to your feet.  If you find a minor scrape,  cut, or break in the skin on your feet, keep it and the skin around it clean and dry. These areas may be cleansed with mild soap and water. Do not cleanse the area with peroxide, alcohol, or iodine.  When you remove an adhesive bandage, be sure not to damage the skin around it.  If you have a wound, look at it several times a day to make sure it is healing.  Do not use heating pads or hot water bottles. They may burn your skin. If you have lost feeling in your feet or legs, you may not know it is happening until it is too late.  Make sure your health care provider performs a complete foot exam at least annually or more often if you have foot problems. Report any cuts, sores, or bruises to your health care provider immediately. SEEK MEDICAL CARE IF:   You have an injury that is not healing.  You have cuts or breaks in the skin.  You have an ingrown nail.  You notice redness on your legs or feet.  You feel burning or tingling in your legs or feet.  You have pain or cramps in your legs and feet.  Your legs or feet are numb.  Your feet always feel cold. SEEK IMMEDIATE MEDICAL CARE IF:   There is increasing redness,   swelling, or pain in or around a wound.  There is a red line that goes up your leg.  Pus is coming from a wound.  You develop a fever or as directed by your health care provider.  You notice a bad smell coming from an ulcer or wound.   This information is not intended to replace advice given to you by your health care provider. Make sure you discuss any questions you have with your health care provider.   Document Released: 02/13/2000 Document Revised: 10/18/2012 Document Reviewed: 07/25/2012 Elsevier Interactive Patient Education 2016 Elsevier Inc.  

## 2014-12-20 NOTE — Addendum Note (Signed)
Addended by: Rolena Infante on: 12/20/2014 05:09 PM   Modules accepted: Orders

## 2014-12-21 LAB — CMP14+EGFR
ALK PHOS: 79 IU/L (ref 39–117)
ALT: 11 IU/L (ref 0–32)
AST: 17 IU/L (ref 0–40)
Albumin/Globulin Ratio: 1.3 (ref 1.1–2.5)
Albumin: 4.1 g/dL (ref 3.6–4.8)
BUN/Creatinine Ratio: 13 (ref 11–26)
BUN: 14 mg/dL (ref 8–27)
Bilirubin Total: 0.3 mg/dL (ref 0.0–1.2)
CALCIUM: 9.4 mg/dL (ref 8.7–10.3)
CO2: 23 mmol/L (ref 18–29)
CREATININE: 1.08 mg/dL — AB (ref 0.57–1.00)
Chloride: 100 mmol/L (ref 97–106)
GFR calc Af Amer: 61 mL/min/{1.73_m2} (ref >59)
GFR, EST NON AFRICAN AMERICAN: 53 mL/min/{1.73_m2} — AB (ref >59)
Globulin, Total: 3.1 g/dL (ref 1.5–4.5)
Glucose: 198 mg/dL — ABNORMAL HIGH (ref 65–99)
POTASSIUM: 3.8 mmol/L (ref 3.5–5.2)
Sodium: 143 mmol/L (ref 136–144)
Total Protein: 7.2 g/dL (ref 6.0–8.5)

## 2014-12-21 LAB — LIPID PANEL
CHOL/HDL RATIO: 5.5 ratio — AB (ref 0.0–4.4)
CHOLESTEROL TOTAL: 144 mg/dL (ref 100–199)
HDL: 26 mg/dL — AB (ref >39)
LDL CALC: 61 mg/dL (ref 0–99)
TRIGLYCERIDES: 286 mg/dL — AB (ref 0–149)
VLDL Cholesterol Cal: 57 mg/dL — ABNORMAL HIGH (ref 5–40)

## 2014-12-23 ENCOUNTER — Telehealth: Payer: Self-pay | Admitting: *Deleted

## 2014-12-23 MED ORDER — HYDROCODONE-ACETAMINOPHEN 5-325 MG PO TABS: 1.0000 | ORAL_TABLET | ORAL | Status: DC | PRN

## 2014-12-23 NOTE — Telephone Encounter (Signed)
lortab ready for pick up

## 2014-12-23 NOTE — Telephone Encounter (Signed)
Called patient to give her lab results, while on the phone she asked if Alexandra Hunt would refill her hydrocodone.  States she was prescribed this a few weeks ago for cervical radiculopathy at the ED at [facility].  States she mentioned needing a refill at her appointment on Friday 12/20/14.

## 2014-12-23 NOTE — Telephone Encounter (Signed)
Pt aware written Rx is at front desk ready for pickup  

## 2015-01-08 ENCOUNTER — Telehealth: Payer: Self-pay | Admitting: Nurse Practitioner

## 2015-01-09 NOTE — Telephone Encounter (Signed)
See quick note

## 2015-01-09 NOTE — Telephone Encounter (Signed)
Using [facility] for supplies, wanting on form

## 2015-01-13 ENCOUNTER — Telehealth: Payer: Self-pay | Admitting: Nurse Practitioner

## 2015-01-13 DIAGNOSIS — E1065 Type 1 diabetes mellitus with hyperglycemia: Secondary | ICD-10-CM

## 2015-01-13 DIAGNOSIS — IMO0002 Reserved for concepts with insufficient information to code with codable children: Secondary | ICD-10-CM

## 2015-01-13 DIAGNOSIS — E108 Type 1 diabetes mellitus with unspecified complications: Principal | ICD-10-CM

## 2015-01-14 MED ORDER — INSULIN DETEMIR 100 UNIT/ML ~~LOC~~ SOLN: SUBCUTANEOUS | Status: DC

## 2015-01-14 NOTE — Telephone Encounter (Signed)
New rx sent to pharmacy

## 2015-01-17 ENCOUNTER — Telehealth: Payer: Self-pay | Admitting: Pharmacist

## 2015-01-17 MED ORDER — INSULIN GLARGINE 300 UNIT/ML ~~LOC~~ SOPN: 60.0000 [IU] | PEN_INJECTOR | Freq: Two times a day (BID) | SUBCUTANEOUS | Status: DC

## 2015-01-17 NOTE — Telephone Encounter (Signed)
Patient called and BG is 515.  I advised her to check ketones and she states urine ketosticks showed negative ketones.  She believes that she forget second Levemir dose last pm but reports that BG has been higher since she switched from Lantus to Levemir.  Advised patient to administer 20 units of Regular insulin.  I have left her a sample and and Rx for #3 pens free of [place] - inject 60 units bid to try since it is similar to Lantus.  I also made appt for AWV and recheck DM for 02/05/2015.

## 2015-01-20 NOTE — Telephone Encounter (Signed)
Patient rechecked INR and was down to 385.

## 2015-02-04 ENCOUNTER — Telehealth: Payer: Self-pay | Admitting: Nurse Practitioner

## 2015-02-05 ENCOUNTER — Ambulatory Visit: Payer: Self-pay | Admitting: Pharmacist

## 2015-02-07 NOTE — Telephone Encounter (Signed)
Finally got form, faxed today

## 2015-02-10 ENCOUNTER — Ambulatory Visit: Payer: Self-pay | Admitting: Pharmacist

## 2015-02-11 ENCOUNTER — Other Ambulatory Visit: Payer: Self-pay | Admitting: Family Medicine

## 2015-02-12 ENCOUNTER — Other Ambulatory Visit: Payer: Self-pay | Admitting: Nurse Practitioner

## 2015-02-12 NOTE — Telephone Encounter (Signed)
Please call in ativan with 0 refills Missed her apppointment with T. Eckard- really need to reschedule that

## 2015-02-12 NOTE — Telephone Encounter (Signed)
Last seen 12/20/14  MMM If approved route to nurse to call into [facility]

## 2015-02-16 ENCOUNTER — Other Ambulatory Visit: Payer: Self-pay | Admitting: Pharmacist

## 2015-02-17 ENCOUNTER — Ambulatory Visit (INDEPENDENT_AMBULATORY_CARE_PROVIDER_SITE_OTHER): Payer: Medicare HMO | Admitting: Ophthalmology

## 2015-02-17 ENCOUNTER — Other Ambulatory Visit: Payer: Self-pay | Admitting: Pharmacist

## 2015-02-17 MED ORDER — INSULIN PEN NEEDLE 32G X 4 MM MISC: Status: DC

## 2015-02-20 ENCOUNTER — Ambulatory Visit (INDEPENDENT_AMBULATORY_CARE_PROVIDER_SITE_OTHER): Payer: Commercial Managed Care - HMO | Admitting: Family Medicine

## 2015-02-20 ENCOUNTER — Ambulatory Visit (INDEPENDENT_AMBULATORY_CARE_PROVIDER_SITE_OTHER): Payer: Commercial Managed Care - HMO | Admitting: Pharmacist

## 2015-02-20 ENCOUNTER — Encounter: Payer: Self-pay | Admitting: Family Medicine

## 2015-02-20 VITALS — BP 120/64 | HR 103 | Temp 97.7°F | Ht 64.0 in | Wt 192.2 lb

## 2015-02-20 DIAGNOSIS — J0100 Acute maxillary sinusitis, unspecified: Secondary | ICD-10-CM | POA: Diagnosis not present

## 2015-02-20 DIAGNOSIS — Z1159 Encounter for screening for other viral diseases: Secondary | ICD-10-CM | POA: Diagnosis not present

## 2015-02-20 DIAGNOSIS — E1165 Type 2 diabetes mellitus with hyperglycemia: Secondary | ICD-10-CM

## 2015-02-20 DIAGNOSIS — Z794 Long term (current) use of insulin: Secondary | ICD-10-CM

## 2015-02-20 MED ORDER — CEFDINIR 300 MG PO CAPS: 300.0000 mg | ORAL_CAPSULE | Freq: Two times a day (BID) | ORAL | Status: DC

## 2015-02-20 MED ORDER — INSULIN DEGLUDEC 100 UNIT/ML ~~LOC~~ SOPN: 60.0000 [IU] | PEN_INJECTOR | Freq: Two times a day (BID) | SUBCUTANEOUS | Status: DC

## 2015-02-20 NOTE — Progress Notes (Signed)
Patient presented today for Annual Wellness Visit and recheck DM however she c/o cough and congestion with sinus pain for last 9 days.  Triaged to Dr Wendi Snipes for evaluation.  She was given [place] U-100 samples to get her through until Medicare restarts at beginning of year - to inject 60 units bid  Rescheduled AWV for 03/14/2015

## 2015-02-20 NOTE — Patient Instructions (Signed)
Great to meet you!  Come back if you get worse or do not get better as expected.   Finish all of the antibiotics  Sinusitis, Adult Sinusitis is redness, soreness, and inflammation of the paranasal sinuses. Paranasal sinuses are air pockets within the bones of your face. They are located beneath your eyes, in the middle of your forehead, and above your eyes. In healthy paranasal sinuses, mucus is able to drain out, and air is able to circulate through them by way of your nose. However, when your paranasal sinuses are inflamed, mucus and air can become trapped. This can allow bacteria and other germs to grow and cause infection. Sinusitis can develop quickly and last only a short time (acute) or continue over a long period (chronic). Sinusitis that lasts for more than 12 weeks is considered chronic. CAUSES Causes of sinusitis include:  Allergies.  Structural abnormalities, such as displacement of the cartilage that separates your nostrils (deviated septum), which can decrease the air flow through your nose and sinuses and affect sinus drainage.  Functional abnormalities, such as when the small hairs (cilia) that line your sinuses and help remove mucus do not work properly or are not present. SIGNS AND SYMPTOMS Symptoms of acute and chronic sinusitis are the same. The primary symptoms are pain and pressure around the affected sinuses. Other symptoms include:  Upper toothache.  Earache.  Headache.  Bad breath.  Decreased sense of smell and taste.  A cough, which worsens when you are lying flat.  Fatigue.  Fever.  Thick drainage from your nose, which often is green and may contain pus (purulent).  Swelling and warmth over the affected sinuses. DIAGNOSIS Your health care provider will perform a physical exam. During your exam, your health care provider may perform any of the following to help determine if you have acute sinusitis or chronic sinusitis:  Look in your nose for signs  of abnormal growths in your nostrils (nasal polyps).  Tap over the affected sinus to check for signs of infection.  View the inside of your sinuses using an imaging device that has a light attached (endoscope). If your health care provider suspects that you have chronic sinusitis, one or more of the following tests may be recommended:  Allergy tests.  Nasal culture. A sample of mucus is taken from your nose, sent to a lab, and screened for bacteria.  Nasal cytology. A sample of mucus is taken from your nose and examined by your health care provider to determine if your sinusitis is related to an allergy. TREATMENT Most cases of acute sinusitis are related to a viral infection and will resolve on their own within 10 days. Sometimes, medicines are prescribed to help relieve symptoms of both acute and chronic sinusitis. These may include pain medicines, decongestants, nasal steroid sprays, or saline sprays. However, for sinusitis related to a bacterial infection, your health care provider will prescribe antibiotic medicines. These are medicines that will help kill the bacteria causing the infection. Rarely, sinusitis is caused by a fungal infection. In these cases, your health care provider will prescribe antifungal medicine. For some cases of chronic sinusitis, surgery is needed. Generally, these are cases in which sinusitis recurs more than 3 times per year, despite other treatments. HOME CARE INSTRUCTIONS  Drink plenty of water. Water helps thin the mucus so your sinuses can drain more easily.  Use a humidifier.  Inhale steam 3-4 times a day (for example, sit in the bathroom with the shower running).  Apply  a warm, moist washcloth to your face 3-4 times a day, or as directed by your health care provider.  Use saline nasal sprays to help moisten and clean your sinuses.  Take medicines only as directed by your health care provider.  If you were prescribed either an antibiotic or  antifungal medicine, finish it all even if you start to feel better. SEEK IMMEDIATE MEDICAL CARE IF:  You have increasing pain or severe headaches.  You have nausea, vomiting, or drowsiness.  You have swelling around your face.  You have vision problems.  You have a stiff neck.  You have difficulty breathing.   This information is not intended to replace advice given to you by your health care provider. Make sure you discuss any questions you have with your health care provider.   Document Released: 02/15/2005 Document Revised: 03/08/2014 Document Reviewed: 03/02/2011 Elsevier Interactive Patient Education [employer].

## 2015-02-20 NOTE — Progress Notes (Signed)
   HPI  Patient presents today here today for concern of sinus infection.  Patient explains that she's had 7 days of symptoms including bilateral maxillary sinus tenderness and pressure, nasal congestion, and bilateral ear pain.  She also has intermittent cough.  She denies fever, chills, sweats. She has a interesting history of retained foreign body in her left maxillary sinus from a complication after a root canal.  On screening for depression she admits to recent passive suicidal ideation, considering that she would be better off dead, she states that occasionally she thinks about driving her car until latke but changes her mind. She describes several stressful family relationships. She states that she would not do this because she needs to be here for her daughter  PMH: Smoking status noted ROS: Per HPI  Objective: BP 120/64 mmHg  Pulse 103  Temp(Src) 97.7 F (36.5 C) (Oral)  Ht 5\' 4"  (1.626 m)  Wt 192 lb 3.2 oz (87.181 kg)  BMI 32.97 kg/m2 Gen: NAD, alert, cooperative with exam HEENT: NCAT,Tms WnL BL, left-sided maxillary sinus pressure and pain to palpation CV: RRR, good S1/S2, no murmur Resp: CTABL, no wheezes, non-labored Ext: No edema, warm Neuro: Alert and oriented, No gross deficits  Psych: Tearful after discussion about suicidal ideation, she denies suicidal ideation currently but states that a few weeks ago she had thoughts described above. She contracts for safety  Assessment and plan:  # Acute sinusitis Treat with Omnicef Continue neti pot Return to clinic if worsening or does not improve I discussed the possibility of cross reactivity with penicillin allergy, however she has tolerated Keflex previously  # Suicidal ideation She contracts for safety She has several current family stressors and the holidays are a likely exacerbating factor    Meds ordered this encounter  Medications  . cefdinir (OMNICEF) 300 MG capsule    Sig: Take 1 capsule (300 mg  total) by mouth 2 (two) times daily. 1 po BID    Dispense:  20 capsule    Refill:  0    Laroy Apple, MD [facility] 02/20/2015, 4:15 PM

## 2015-02-21 LAB — HEPATITIS C ANTIBODY

## 2015-02-21 LAB — BMP8+EGFR
BUN / CREAT RATIO: 20 (ref 11–26)
BUN: 21 mg/dL (ref 8–27)
CALCIUM: 9.6 mg/dL (ref 8.7–10.3)
CO2: 23 mmol/L (ref 18–29)
CREATININE: 1.06 mg/dL — AB (ref 0.57–1.00)
Chloride: 101 mmol/L (ref 96–106)
GFR calc Af Amer: 63 mL/min/{1.73_m2} (ref >59)
GFR calc non Af Amer: 54 mL/min/{1.73_m2} — ABNORMAL LOW (ref >59)
GLUCOSE: 134 mg/dL — AB (ref 65–99)
Potassium: 4.4 mmol/L (ref 3.5–5.2)
Sodium: 140 mmol/L (ref 134–144)

## 2015-03-06 ENCOUNTER — Other Ambulatory Visit: Payer: Self-pay | Admitting: Nurse Practitioner

## 2015-03-07 ENCOUNTER — Telehealth: Payer: Self-pay | Admitting: Pharmacist

## 2015-03-07 MED ORDER — INSULIN GLARGINE 300 UNIT/ML ~~LOC~~ SOPN: 60.0000 [IU] | PEN_INJECTOR | Freq: Two times a day (BID) | SUBCUTANEOUS | Status: DC

## 2015-03-07 NOTE — Telephone Encounter (Signed)
Patient called - she only received 1 box of toujeo from [facility] and this will not last a whole month.  Rx resent for #3 boxes (or 13.63ml) which is a 34 day supply

## 2015-03-14 ENCOUNTER — Other Ambulatory Visit: Payer: Commercial Managed Care - HMO

## 2015-03-14 ENCOUNTER — Ambulatory Visit (INDEPENDENT_AMBULATORY_CARE_PROVIDER_SITE_OTHER): Payer: Commercial Managed Care - HMO | Admitting: Pharmacist

## 2015-03-14 ENCOUNTER — Encounter: Payer: Self-pay | Admitting: Pharmacist

## 2015-03-14 VITALS — BP 128/68 | HR 80 | Ht 64.0 in | Wt 194.0 lb

## 2015-03-14 DIAGNOSIS — Z0000 Encounter for general adult medical examination without abnormal findings: Secondary | ICD-10-CM | POA: Diagnosis not present

## 2015-03-14 DIAGNOSIS — Z794 Long term (current) use of insulin: Secondary | ICD-10-CM

## 2015-03-14 DIAGNOSIS — E119 Type 2 diabetes mellitus without complications: Secondary | ICD-10-CM

## 2015-03-14 DIAGNOSIS — R300 Dysuria: Secondary | ICD-10-CM

## 2015-03-14 DIAGNOSIS — F32A Depression, unspecified: Secondary | ICD-10-CM

## 2015-03-14 DIAGNOSIS — F329 Major depressive disorder, single episode, unspecified: Secondary | ICD-10-CM

## 2015-03-14 DIAGNOSIS — Z780 Asymptomatic menopausal state: Secondary | ICD-10-CM

## 2015-03-14 LAB — POCT URINALYSIS DIPSTICK
Bilirubin, UA: NEGATIVE
GLUCOSE UA: NEGATIVE
Ketones, UA: NEGATIVE
LEUKOCYTES UA: NEGATIVE
NITRITE UA: NEGATIVE
Protein, UA: NEGATIVE
RBC UA: NEGATIVE
Spec Grav, UA: 1.015
UROBILINOGEN UA: NEGATIVE
pH, UA: 6

## 2015-03-14 LAB — POCT UA - MICROSCOPIC ONLY
BACTERIA, U MICROSCOPIC: NEGATIVE
Casts, Ur, LPF, POC: NEGATIVE
Crystals, Ur, HPF, POC: NEGATIVE
MUCUS UA: NEGATIVE
RBC, URINE, MICROSCOPIC: NEGATIVE
WBC, UR, HPF, POC: NEGATIVE
Yeast, UA: NEGATIVE

## 2015-03-14 MED ORDER — HYDROCODONE-ACETAMINOPHEN 5-325 MG PO TABS: 1.0000 | ORAL_TABLET | ORAL | Status: DC | PRN

## 2015-03-14 MED ORDER — DULAGLUTIDE 0.75 MG/0.5ML ~~LOC~~ SOAJ: 0.7500 mg | SUBCUTANEOUS | Status: DC

## 2015-03-14 MED ORDER — SERTRALINE HCL 100 MG PO TABS: 150.0000 mg | ORAL_TABLET | Freq: Every day | ORAL | Status: DC

## 2015-03-14 NOTE — Patient Instructions (Addendum)
Alexandra Hunt , Thank you for taking time to come for your Medicare Wellness Visit. I appreciate your ongoing commitment to your health goals. Please review the following plan we discussed and let me know if I can assist you in the future.    This is a list of the screening recommended for you and due dates:  Health Maintenance  Topic Date Due  . Stool Blood Test  Postpone b/c colonoscopy due this year  . DEXA scan (bone density measurement)  Will set up appointment  . Shingles Vaccine  06/20/2015 - checked cost $47  . Tetanus Vaccine  06/20/2015 - checked cost $62.18  . Lipid (cholesterol) test  03/22/2015  . Hemoglobin A1C  03/22/2015  . Eye exam for diabetics  05/16/2015  . Mammogram  05/18/2015  . Flu Shot  09/30/2015  . Colon Cancer Screening  12/03/2015  . Complete foot exam   12/20/2015  . Pneumonia vaccines (2 of 2 - PPSV23) 12/20/2015  .  Hepatitis C: One time screening is recommended by Center for Disease Control  (CDC) for  adults born from [birth year] through 1963.   Completed  *Topic was postponed. The date shown is not the original due date.    Health Maintenance, Female Adopting a healthy lifestyle and getting preventive care can go a long way to promote health and wellness. Talk with your health care provider about what schedule of regular examinations is right for you. This is a good chance for you to check in with your provider about disease prevention and staying healthy. In between checkups, there are plenty of things you can do on your own. Experts have done a lot of research about which lifestyle changes and preventive measures are most likely to keep you healthy. Ask your health care provider for more information. WEIGHT AND DIET  Eat a healthy diet  Be sure to include plenty of vegetables, fruits, low-fat dairy products, and lean protein.  Do not eat a lot of foods high in solid fats, added sugars, or salt.  Get regular exercise. This is one of the most important  things you can do for your health.  Most adults should exercise for at least 150 minutes each week. The exercise should increase your heart rate and make you sweat (moderate-intensity exercise).  Most adults should also do strengthening exercises at least twice a week. This is in addition to the moderate-intensity exercise.  Maintain a healthy weight  Body mass index (BMI) is a measurement that can be used to identify possible weight problems. It estimates body fat based on height and weight. Your health care provider can help determine your BMI and help you achieve or maintain a healthy weight.  For females 36 years of age and older:   A BMI below 18.5 is considered underweight.  A BMI of 18.5 to 24.9 is normal.  A BMI of 25 to 29.9 is considered overweight.  A BMI of 30 and above is considered obese.  Watch levels of cholesterol and blood lipids  You should start having your blood tested for lipids and cholesterol at 67 years of age, then have this test every 5 years.  You may need to have your cholesterol levels checked more often if:  Your lipid or cholesterol levels are high.  You are older than 68 years of age.  You are at high risk for heart disease.  CANCER SCREENING   Lung Cancer  Lung cancer screening is recommended for adults 51-89 years old who  are at high risk for lung cancer because of a history of smoking.  A yearly low-dose CT scan of the lungs is recommended for people who:  Currently smoke.  Have quit within the past 15 years.  Have at least a 30-pack-year history of smoking. A pack year is smoking an average of one pack of cigarettes a day for 1 year.  Yearly screening should continue until it has been 15 years since you quit.  Yearly screening should stop if you develop a health problem that would prevent you from having lung cancer treatment.  Breast Cancer  Practice breast self-awareness. This means understanding how your breasts normally  appear and feel.  It also means doing regular breast self-exams. Let your health care provider know about any changes, no matter how small.  If you are in your 20s or 30s, you should have a clinical breast exam (CBE) by a health care provider every 1-3 years as part of a regular health exam.  If you are 35 or older, have a CBE every year. Also consider having a breast X-ray (mammogram) every year.  If you have a family history of breast cancer, talk to your health care provider about genetic screening.  If you are at high risk for breast cancer, talk to your health care provider about having an MRI and a mammogram every year.  Breast cancer gene (BRCA) assessment is recommended for women who have family members with BRCA-related cancers. BRCA-related cancers include:  Breast.  Ovarian.  Tubal.  Peritoneal cancers.  Results of the assessment will determine the need for genetic counseling and BRCA1 and BRCA2 testing. Cervical Cancer Your health care provider may recommend that you be screened regularly for cancer of the pelvic organs (ovaries, uterus, and vagina). This screening involves a pelvic examination, including checking for microscopic changes to the surface of your cervix (Pap test). You may be encouraged to have this screening done every 3 years, beginning at age 51.  For women ages 24-65, health care providers may recommend pelvic exams and Pap testing every 3 years, or they may recommend the Pap and pelvic exam, combined with testing for human papilloma virus (HPV), every 5 years. Some types of HPV increase your risk of cervical cancer. Testing for HPV may also be done on women of any age with unclear Pap test results.  Other health care providers may not recommend any screening for nonpregnant women who are considered low risk for pelvic cancer and who do not have symptoms. Ask your health care provider if a screening pelvic exam is right for you.  If you have had past  treatment for cervical cancer or a condition that could lead to cancer, you need Pap tests and screening for cancer for at least 20 years after your treatment. If Pap tests have been discontinued, your risk factors (such as having a new sexual partner) need to be reassessed to determine if screening should resume. Some women have medical problems that increase the chance of getting cervical cancer. In these cases, your health care provider may recommend more frequent screening and Pap tests. Colorectal Cancer  This type of cancer can be detected and often prevented.  Routine colorectal cancer screening usually begins at 68 years of age and continues through 68 years of age.  Your health care provider may recommend screening at an earlier age if you have risk factors for colon cancer.  Your health care provider may also recommend using home test kits to check for  hidden blood in the stool.  A small camera at the end of a tube can be used to examine your colon directly (sigmoidoscopy or colonoscopy). This is done to check for the earliest forms of colorectal cancer.  Routine screening usually begins at age 64.  Direct examination of the colon should be repeated every 5-10 years through 68 years of age. However, you may need to be screened more often if early forms of precancerous polyps or small growths are found. Skin Cancer  Check your skin from head to toe regularly.  Tell your health care provider about any new moles or changes in moles, especially if there is a change in a mole's shape or color.  Also tell your health care provider if you have a mole that is larger than the size of a pencil eraser.  Always use sunscreen. Apply sunscreen liberally and repeatedly throughout the day.  Protect yourself by wearing long sleeves, pants, a wide-brimmed hat, and sunglasses whenever you are outside. HEART DISEASE, DIABETES, AND HIGH BLOOD PRESSURE   High blood pressure causes heart disease and  increases the risk of stroke. High blood pressure is more likely to develop in:  People who have blood pressure in the high end of the normal range (130-139/85-89 mm Hg).  People who are overweight or obese.  People who are African American.  If you are 15-70 years of age, have your blood pressure checked every 3-5 years. If you are 39 years of age or older, have your blood pressure checked every year. You should have your blood pressure measured twice--once when you are at a hospital or clinic, and once when you are not at a hospital or clinic. Record the average of the two measurements. To check your blood pressure when you are not at a hospital or clinic, you can use:  An automated blood pressure machine at a pharmacy.  A home blood pressure monitor.  If you are between 51 years and 48 years old, ask your health care provider if you should take aspirin to prevent strokes.  Have regular diabetes screenings. This involves taking a blood sample to check your fasting blood sugar level.  If you are at a normal weight and have a low risk for diabetes, have this test once every three years after 68 years of age.  If you are overweight and have a high risk for diabetes, consider being tested at a younger age or more often. PREVENTING INFECTION  Hepatitis B  If you have a higher risk for hepatitis B, you should be screened for this virus. You are considered at high risk for hepatitis B if:  You were born in a country where hepatitis B is common. Ask your health care provider which countries are considered high risk.  Your parents were born in a high-risk country, and you have not been immunized against hepatitis B (hepatitis B vaccine).  You have HIV or AIDS.  You use needles to inject street drugs.  You live with someone who has hepatitis B.  You have had sex with someone who has hepatitis B.  You get hemodialysis treatment.  You take certain medicines for conditions, including  cancer, organ transplantation, and autoimmune conditions. Hepatitis C  Blood testing is recommended for:  Everyone born from [birth year] through 1963.  Anyone with known risk factors for hepatitis C. Sexually transmitted infections (STIs)  You should be screened for sexually transmitted infections (STIs) including gonorrhea and chlamydia if:  You are sexually active and  are younger than 68 years of age.  You are older than 68 years of age and your health care provider tells you that you are at risk for this type of infection.  Your sexual activity has changed since you were last screened and you are at an increased risk for chlamydia or gonorrhea. Ask your health care provider if you are at risk.  If you do not have HIV, but are at risk, it may be recommended that you take a prescription medicine daily to prevent HIV infection. This is called pre-exposure prophylaxis (PrEP). You are considered at risk if:  You are sexually active and do not regularly use condoms or know the HIV status of your partner(s).  You take drugs by injection.  You are sexually active with a partner who has HIV. Talk with your health care provider about whether you are at high risk of being infected with HIV. If you choose to begin PrEP, you should first be tested for HIV. You should then be tested every 3 months for as long as you are taking PrEP.  PREGNANCY   If you are premenopausal and you may become pregnant, ask your health care provider about preconception counseling.  If you may become pregnant, take 400 to 800 micrograms (mcg) of folic acid every day.  If you want to prevent pregnancy, talk to your health care provider about birth control (contraception). OSTEOPOROSIS AND MENOPAUSE   Osteoporosis is a disease in which the bones lose minerals and strength with aging. This can result in serious bone fractures. Your risk for osteoporosis can be identified using a bone density scan.  If you are 81 years of  age or older, or if you are at risk for osteoporosis and fractures, ask your health care provider if you should be screened.  Ask your health care provider whether you should take a calcium or vitamin D supplement to lower your risk for osteoporosis.  Menopause may have certain physical symptoms and risks.  Hormone replacement therapy may reduce some of these symptoms and risks. Talk to your health care provider about whether hormone replacement therapy is right for you.  HOME CARE INSTRUCTIONS   Schedule regular health, dental, and eye exams.  Stay current with your immunizations.   Do not use any tobacco products including cigarettes, chewing tobacco, or electronic cigarettes.  If you are pregnant, do not drink alcohol.  If you are breastfeeding, limit how much and how often you drink alcohol.  Limit alcohol intake to no more than 1 drink per day for nonpregnant women. One drink equals 12 ounces of beer, 5 ounces of wine, or 1 ounces of hard liquor.  Do not use street drugs.  Do not share needles.  Ask your health care provider for help if you need support or information about quitting drugs.  Tell your health care provider if you often feel depressed.  Tell your health care provider if you have ever been abused or do not feel safe at home.   This information is not intended to replace advice given to you by your health care provider. Make sure you discuss any questions you have with your health care provider.   Document Released: 08/31/2010 Document Revised: 03/08/2014 Document Reviewed: 01/17/2013 Elsevier Interactive Patient Education [employer].

## 2015-03-14 NOTE — Progress Notes (Signed)
Patient ID: Alexandra Hunt, female   DOB: 26-Sep-1945, 70 y.o.   MRN: [id number]    Subjective:   Alexandra Hunt is a 70 y.o. female who presents for an Initial Medicare Annual Wellness Visit.  Alexandra Hunt is married.  She lives with her husband in [place].  Her adult son and daughter and 24 yo granddaughter also live with her.  She reports that her husband is very sick.  She states with her husbands illness and her family situation she feels very stressed most of the time.   Alexandra Hunt's main health concerns are uncontrolled DM and depression / stress.   Review of Systems  Review of Systems  Constitutional: Positive for malaise/fatigue. Negative for fever and weight loss.  Eyes: Negative.   Respiratory: Positive for cough (but has improved compared to 2 weeks ago). Negative for hemoptysis, sputum production, shortness of breath and wheezing.   Cardiovascular: Negative.   Gastrointestinal: Negative.   Genitourinary: Positive for dysuria and frequency. Negative for urgency, hematuria and flank pain.  Musculoskeletal: Positive for myalgias, back pain and joint pain. Negative for falls and neck pain.  Skin: Negative.   Neurological: Negative.  Negative for weakness.  Endo/Heme/Allergies: Positive for polydipsia.  Psychiatric/Behavioral: Positive for depression. Negative for suicidal ideas, hallucinations, memory loss and substance abuse. The patient is not nervous/anxious and does not have insomnia.    Current Medications (verified) Outpatient Encounter Prescriptions as of 03/14/2015  Medication Sig  . aspirin 81 MG tablet Take 81 mg by mouth daily.  . fenofibrate 160 MG tablet TAKE ONE TABLET BY MOUTH ONE TIME DAILY  . furosemide (LASIX) 20 MG tablet Take 1 tablet (20 mg total) by mouth daily.  Marland Kitchen HYDROcodone-acetaminophen (NORCO/VICODIN) 5-325 MG tablet Take 1 tablet by mouth as needed for moderate pain.  . Insulin Glargine (TOUJEO SOLOSTAR) 300 UNIT/ML SOPN Inject 60 Units into the skin 2  (two) times daily.  . Insulin Pen Needle (BD PEN NEEDLE NANO U/F) 32G X 4 MM MISC Use up to 6 times daily with toujeo and humalog.  . insulin regular (HUMULIN R) 100 units/mL injection Sliding scale: less than 80 (6u): 80-120 (11u): 121-150 (12u): 151-200 (13u): 210-250 (14u): 251-300 (16u): over 300 (18u)  . lisinopril (PRINIVIL,ZESTRIL) 20 MG tablet Take 1 tablet (20 mg total) by mouth daily.  Marland Kitchen LORazepam (ATIVAN) 1 MG tablet TAKE ONE TABLET BY MOUTH TWICE DAILY AS NEEDED  . metFORMIN (GLUCOPHAGE) 500 MG tablet Take 1 tablet (500 mg total) by mouth 2 (two) times daily with a meal.  . Multiple Vitamins-Minerals (THRIVE FOR LIFE WOMENS PO) Take by mouth daily.  . ondansetron (ZOFRAN) 4 MG tablet   . ONE TOUCH ULTRA TEST test strip USE TO TEST BLOOD SUGAR AT LEAST FOUR TIMES A DAY (ON SLIDING SCALE)  . pseudoephedrine (SUDAFED) 30 MG tablet Take 30 mg by mouth 2 (two) times daily.  . sertraline (ZOLOFT) 100 MG tablet Take 1.5 tablets (150 mg total) by mouth daily.  . [DISCONTINUED] HYDROcodone-acetaminophen (NORCO/VICODIN) 5-325 MG tablet Take 1 tablet by mouth as needed for moderate pain.  . [DISCONTINUED] sertraline (ZOLOFT) 100 MG tablet Take 1 tablet (100 mg total) by mouth daily.  . B-D INS SYR ULTRAFINE 1CC/31G 31G X 5/16" 1 ML MISC USE DAILY WITH LANTUS AND HUMALOG INSULIN SLIDING SCALES AS INSTRUCTED  . Dulaglutide (TRULICITY) A999333 0000000 SOPN Inject 0.75 mg into the skin once a week.  . Insulin Degludec (TRESIBA FLEXTOUCH) 100 UNIT/ML SOPN Inject 60 Units into  the skin 2 (two) times daily. (Patient not taking: Reported on 03/14/2015)  . insulin detemir (LEVEMIR) 100 UNIT/ML injection INJECT 60 UNITS UNDER THE SKIN TWICE A DAY AS INSTRUCTED (Patient not taking: Reported on 03/14/2015)  . [DISCONTINUED] cefdinir (OMNICEF) 300 MG capsule Take 1 capsule (300 mg total) by mouth 2 (two) times daily. 1 po BID (Patient not taking: Reported on 03/14/2015)   No facility-administered encounter  medications on file as of 03/14/2015.    Allergies (verified) Codeine; Ivp dye; Latex; Penicillins; and Sulfa antibiotics   History: Past Medical History  Diagnosis Date  . Diabetes mellitus without complication ([place])   . Hypertension   . Hyperlipidemia   . Depression   . Cervical radiculopathy   . Cancer ([place])     uterine  . Benign breast lumps    Past Surgical History  Procedure Laterality Date  . Abdominal hysterectomy    . Breast surgery    . Tubal ligation     Family History  Problem Relation Age of Onset  . Diabetes Mother   . Blindness Mother     related to diabetes  . Stroke Mother   . Heart disease Father     MI  . Heart attack Father   . Heart defect Father   . Diabetes Brother     diet controlled   Social History   Occupational History  . Not on file.   Social History Main Topics  . Smoking status: Never Smoker   . Smokeless tobacco: Never Used  . Alcohol Use: No  . Drug Use: No  . Sexual Activity: No    Do you feel safe at home?  Yes  Dietary issues and exercise activities: Current Exercise Habits:: Exercise is limited by, Limited by:: psychological condition(s);orthopedic condition(s)  Current Dietary habits:  Patient reports some days she follows low CHO / CHO counting diet and other days she does not.  My BG is much better when I am following recommended diet.    HBG readings:  7 day avg = 192 14 day avg = 207 30 day avg = 198  Ranges from 70 to 377  Objective:    Today's Vitals   03/14/15 1602  BP: 128/68  Pulse: 80  Height: 5\' 4"  (1.626 m)  Weight: 194 lb (87.998 kg)  PainSc: 4   PainLoc: Neck   Body mass index is 33.28 kg/(m^2).   Last A1c = 8.8% (12/20/2014)  Activities of Daily Living In your present state of health, do you have any difficulty performing the following activities: 03/14/2015 12/20/2014  Hearing? N N  Vision? Y N  Difficulty concentrating or making decisions? N N  Walking or climbing stairs? N N    Dressing or bathing? N N  Doing errands, shopping? N N  Preparing Food and eating ? N -  Using the Toilet? N -  In the past six months, have you accidently leaked urine? Y -  Do you have problems with loss of bowel control? N -  Managing your Medications? N -  Managing your Finances? N -  Housekeeping or managing your Housekeeping? N -   Cardiac Risk Factors include: advanced age (>27men, >64 women);diabetes mellitus;dyslipidemia;family history of premature cardiovascular disease;hypertension;obesity (BMI >30kg/m2);sedentary lifestyle  Depression Screen PHQ 2/9 Scores 03/14/2015 02/20/2015 12/20/2014 10/11/2013  PHQ - 2 Score 6 6 0 3  PHQ- 9 Score 14 15 - 9    Fall Risk Fall Risk  02/20/2015 12/20/2014 08/09/2014 10/11/2013 02/07/2013  Falls in  the past year? No No No No No    Cognitive Function: MMSE - Mini Mental State Exam 03/14/2015  Orientation to time 5  Orientation to Place 5  Registration 3  Attention/ Calculation 5  Recall 3  Language- name 2 objects 2  Language- repeat 1  Language- follow 3 step command 3  Language- read & follow direction 1  Write a sentence 1  Copy design 1  Total score 30    Immunizations and Health Maintenance Immunization History  Administered Date(s) Administered  . Influenza,inj,Quad PF,36+ Mos 02/07/2013, 12/20/2014  . Influenza-Unspecified 05/30/2014  . Pneumococcal Conjugate-13 12/20/2014   Health Maintenance Due  Topic Date Due  . COLON CANCER SCREENING ANNUAL FOBT  08/23/1997    Patient Care Team: Chipper Herb, MD as PCP - General (Family Medicine) Maisie Fus, MD as Consulting Physician (Obstetrics and Gynecology)  Indicate any recent Medical Services you may have received from other than [facility] providers in the past year (date may be approximate).    Assessment:    Annual Wellness Visit  Uncontrolled type 2 DM with insulin use Dysuria / overactive bladder Depression with little family support Chronic  pain   Screening Tests Health Maintenance  Topic Date Due  . COLON CANCER SCREENING ANNUAL FOBT  08/23/1997  . DEXA SCAN  06/20/2015 (Originally 08/23/2012)  . ZOSTAVAX  06/20/2015 (Originally 08/24/2007)  . TETANUS/TDAP  06/20/2015 (Originally 08/24/1966)  . LIPID PANEL  03/22/2015  . HEMOGLOBIN A1C  03/22/2015  . OPHTHALMOLOGY EXAM  05/16/2015  . MAMMOGRAM  05/18/2015  . INFLUENZA VACCINE  09/30/2015  . COLONOSCOPY  12/03/2015  . FOOT EXAM  12/20/2015  . PNA vac Low Risk Adult (2 of 2 - PPSV23) 12/20/2015  . Hepatitis C Screening  Completed        Plan:   During the course of the visit [place] was educated and counseled about the following appropriate screening and preventive services:   Pneumoccal and Influenza vaccines UTD.  Checked patient cost for Td ($62.18) and Zostavax ($47) - patient declined both due to cost at this time  Colorectal cancer screening - patient is due repeat colonoscopy 11/2015  Cardiovascular disease screening - patient to follow up with PCP in 1 month - consider EKG / stress test  Lipids - LDL was at goal but Tg elevated - likely related to diet and uncontrolld type 2 DM.  Patient on statin   Diabetes - add Trulicity 0.75mg  SQ weekly.  Will increase to 1.5mg  weekly after 1 month if tolerated.   Bone Denisty / Osteoporosis Screening - DEXA was not available in office today - order sent and will schedule in next few weeks  Mammogram - UTD, was performed 07/15/2014 at [address] in [place].  Report verified through Care Everywhere  Glaucoma screening / Diabetic Eye Exam - UTD  Nutrition counseling - Reviewed CHO counting diet  Advanced Directives - Information provided  Patient was given list of area psychiatrist, psychologist and counselors.  I discussed the benefits of having someone to consult with to help with stress management and depression.   Also discussed with patient's PCP, Chevis Pretty increasing sertraline to 150mg  qd - she  was agreeable to this.  Patient to follow up with PCP in 4 weeks.  If no improvement then will consider change to alternate medication.  If dysuria / polyuria / overactive bladder does not improve with better BG control then consider medication or referral to urologist.   Orders Placed This Encounter  Procedures  . DG Bone Density    Standing Status: Future     Number of Occurrences:      Standing Expiration Date: 03/31/2015    Order Specific Question:  Reason for Exam (SYMPTOM  OR DIAGNOSIS REQUIRED)    Answer:  postmenopausal female    Order Specific Question:  Preferred imaging location?    Answer:  Internal  . Microalbumin / creatinine urine ratio  . POCT UA - Microscopic Only  . POCT urinalysis dipstick     Patient Instructions (the written plan) were given to the patient.   Cherre Robins, [facility]   03/15/2015

## 2015-03-15 LAB — MICROALBUMIN / CREATININE URINE RATIO: CREATININE, UR: 46.7 mg/dL

## 2015-03-17 ENCOUNTER — Ambulatory Visit (INDEPENDENT_AMBULATORY_CARE_PROVIDER_SITE_OTHER): Payer: Commercial Managed Care - HMO | Admitting: Ophthalmology

## 2015-03-17 ENCOUNTER — Other Ambulatory Visit: Payer: Self-pay | Admitting: Nurse Practitioner

## 2015-03-17 NOTE — Telephone Encounter (Signed)
Please advise on refill- last seen by [facility] 12/01/14, follow up scheduled on 04/18/15.

## 2015-03-18 NOTE — Telephone Encounter (Signed)
Please call in [place] with 1 refills

## 2015-03-18 NOTE — Telephone Encounter (Signed)
rx called into pharmacy

## 2015-03-19 ENCOUNTER — Ambulatory Visit (INDEPENDENT_AMBULATORY_CARE_PROVIDER_SITE_OTHER): Payer: Commercial Managed Care - HMO | Admitting: Family Medicine

## 2015-03-19 ENCOUNTER — Encounter: Payer: Self-pay | Admitting: Family Medicine

## 2015-03-19 VITALS — BP 136/57 | HR 83 | Temp 97.4°F | Ht 64.0 in | Wt 193.4 lb

## 2015-03-19 DIAGNOSIS — L989 Disorder of the skin and subcutaneous tissue, unspecified: Secondary | ICD-10-CM | POA: Diagnosis not present

## 2015-03-19 DIAGNOSIS — L739 Follicular disorder, unspecified: Secondary | ICD-10-CM

## 2015-03-19 MED ORDER — DOXYCYCLINE MONOHYDRATE 100 MG PO CAPS: 100.0000 mg | ORAL_CAPSULE | Freq: Two times a day (BID) | ORAL | Status: DC

## 2015-03-19 NOTE — Progress Notes (Signed)
BP 136/57 mmHg  Pulse 83  Temp(Src) 97.4 F (36.3 C) (Oral)  Ht 5\' 4"  (1.626 m)  Wt 193 lb 6.4 oz (87.726 kg)  BMI 33.18 kg/m2   Subjective:    Patient ID: Alexandra Hunt, female    DOB: Oct 18, 1945, 70 y.o.   MRN: [id number]  HPI: Alexandra Hunt is a 70 y.o. female presenting on 03/19/2015 for Lesion on head, lesions on body   HPI Scalp lesion Patient has a small scalp lesion that is starting to be painful on her posterior scalp. She has noticed that it is been red and warm to touch and has a small black spot in the center that looks like a blackhead. She denies any fevers or chills.she also complains today that she has multiple skin lesions that she gets on her body that eventually end up scarring. She does admit that they're pruritic and she scratches at them. She would like to go see dermatologist for them.  Relevant past medical, surgical, family and social history reviewed and updated as indicated. Interim medical history since our last visit reviewed. Allergies and medications reviewed and updated.  Review of Systems  Constitutional: Negative for fever and chills.  HENT: Negative for congestion, ear discharge and ear pain.   Eyes: Negative for redness and visual disturbance.  Respiratory: Negative for chest tightness and shortness of breath.   Cardiovascular: Negative for chest pain and leg swelling.  Genitourinary: Negative for dysuria and difficulty urinating.  Musculoskeletal: Negative for back pain and gait problem.  Skin: Positive for color change and rash.  Neurological: Negative for light-headedness and headaches.  Psychiatric/Behavioral: Negative for behavioral problems and agitation.  All other systems reviewed and are negative.   Per HPI unless specifically indicated above     Medication List       This list is accurate as of: 03/19/15  4:36 PM.  Always use your most recent med list.               aspirin 81 MG tablet  Take 81 mg by mouth daily.     B-D INS SYR ULTRAFINE 1CC/31G 31G X 5/16" 1 ML Misc  Generic drug:  Insulin Syringe-Needle U-100  USE DAILY WITH LANTUS AND HUMALOG INSULIN SLIDING SCALES AS INSTRUCTED     doxycycline 100 MG capsule  Commonly known as:  MONODOX  Take 1 capsule (100 mg total) by mouth 2 (two) times daily.     Dulaglutide 0.75 MG/0.5ML Sopn  Commonly known as:  TRULICITY  Inject A999333 mg into the skin once a week.     fenofibrate 160 MG tablet  TAKE ONE TABLET BY MOUTH ONE TIME DAILY     furosemide 20 MG tablet  Commonly known as:  LASIX  Take 1 tablet (20 mg total) by mouth daily.     HYDROcodone-acetaminophen 5-325 MG tablet  Commonly known as:  NORCO/VICODIN  Take 1 tablet by mouth as needed for moderate pain.     Insulin Glargine 300 UNIT/ML Sopn  Commonly known as:  TOUJEO SOLOSTAR  Inject 60 Units into the skin 2 (two) times daily.     Insulin Pen Needle 32G X 4 MM Misc  Commonly known as:  BD PEN NEEDLE NANO U/F  Use up to 6 times daily with toujeo and humalog.     insulin regular 100 units/mL injection  Commonly known as:  NOVOLIN R,HUMULIN R  Inject into the skin. Sliding scale     lisinopril 20 MG tablet  Commonly known as:  PRINIVIL,ZESTRIL  Take 1 tablet (20 mg total) by mouth daily.     LORazepam 1 MG tablet  Commonly known as:  ATIVAN  TAKE ONE TABLET BY MOUTH TWICE DAILY AS NEEDED     metFORMIN 500 MG tablet  Commonly known as:  GLUCOPHAGE  Take 1 tablet (500 mg total) by mouth 2 (two) times daily with a meal.     ondansetron 4 MG tablet  Commonly known as:  ZOFRAN     ONE TOUCH ULTRA TEST test strip  Generic drug:  glucose blood  USE TO TEST BLOOD SUGAR AT LEAST FOUR TIMES A DAY (ON SLIDING SCALE)     pseudoephedrine 30 MG tablet  Commonly known as:  SUDAFED  Take 30 mg by mouth 2 (two) times daily. Reported on 03/19/2015     sertraline 100 MG tablet  Commonly known as:  ZOLOFT  Take 1.5 tablets (150 mg total) by mouth daily.     THRIVE FOR LIFE WOMENS PO    Take by mouth daily.           Objective:    BP 136/57 mmHg  Pulse 83  Temp(Src) 97.4 F (36.3 C) (Oral)  Ht 5\' 4"  (1.626 m)  Wt 193 lb 6.4 oz (87.726 kg)  BMI 33.18 kg/m2  Wt Readings from Last 3 Encounters:  03/19/15 193 lb 6.4 oz (87.726 kg)  03/14/15 194 lb (87.998 kg)  02/20/15 192 lb 3.2 oz (87.181 kg)    Physical Exam  Constitutional: She is oriented to person, place, and time. She appears well-developed and well-nourished. No distress.  Eyes: Conjunctivae and EOM are normal. Pupils are equal, round, and reactive to light.  Cardiovascular: Normal rate, regular rhythm, normal heart sounds and intact distal pulses.   No murmur heard. Pulmonary/Chest: Effort normal and breath sounds normal. No respiratory distress. She has no wheezes.  Musculoskeletal: Normal range of motion. She exhibits no edema or tenderness.  Neurological: She is alert and oriented to person, place, and time. Coordination normal.  Skin: Skin is warm and dry. Lesion (0.2 cm area of erythema though they comes to a head that is black. Tender to palpation, no purulent drainage. Small amount of induration but no fluctuation.) and rash (Patient has evidence of multiple well-healed scars over lesions over her whole body that are very small and flat slightly hypopigmented.) noted. She is not diaphoretic.  Psychiatric: She has a normal mood and affect. Her behavior is normal.  Nursing note and vitals reviewed.       Assessment & Plan:   Problem List Items Addressed This Visit    None    Visit Diagnoses    Folliculitis    -  Primary    Small spot of early folliculitis on posterior scalp.Try doxycycline    Relevant Medications    doxycycline (MONODOX) 100 MG capsule    Skin lesions, generalized        abnormal skin lesions throughout body,refer to Derm    Relevant Orders    Ambulatory referral to Dermatology        Follow up plan: Return in about 3 months (around 06/17/2015), or if symptoms worsen  or fail to improve, for diabetes.  Counseling provided for all of the vaccine components Orders Placed This Encounter  Procedures  . Ambulatory referral to Dermatology    Caryl Pina, MD [facility] 03/19/2015, 4:36 PM

## 2015-04-01 ENCOUNTER — Ambulatory Visit (INDEPENDENT_AMBULATORY_CARE_PROVIDER_SITE_OTHER): Payer: Commercial Managed Care - HMO | Admitting: Family

## 2015-04-01 ENCOUNTER — Telehealth: Payer: Self-pay | Admitting: Family Medicine

## 2015-04-01 ENCOUNTER — Ambulatory Visit (INDEPENDENT_AMBULATORY_CARE_PROVIDER_SITE_OTHER): Payer: Commercial Managed Care - HMO

## 2015-04-01 VITALS — BP 129/59 | HR 90 | Temp 97.0°F | Ht 64.0 in | Wt 192.0 lb

## 2015-04-01 DIAGNOSIS — S92301A Fracture of unspecified metatarsal bone(s), right foot, initial encounter for closed fracture: Secondary | ICD-10-CM | POA: Diagnosis not present

## 2015-04-01 DIAGNOSIS — M79671 Pain in right foot: Secondary | ICD-10-CM

## 2015-04-01 NOTE — Patient Instructions (Addendum)
Metatarsal Fracture A metatarsal fracture is a break in a metatarsal bone. Metatarsal bones connect your toe bones to your ankle bones. CAUSES This type of fracture may be caused by:  A sudden twisting of your foot.  A fall onto your foot.  Overuse or repetitive exercise. RISK FACTORS This condition is more likely to develop in people who:  Play contact sports.  Have a bone disease.  Have a low calcium level. SYMPTOMS Symptoms of this condition include:  Pain that is worse when walking or standing.  Pain when pressing on the foot or moving the toes.  Swelling.  Bruising on the top or bottom of the foot.  A foot that appears shorter than the other one. DIAGNOSIS This condition is diagnosed with a physical exam. You may also have imaging tests, such as:  X-rays.  A CT scan.  MRI. TREATMENT Treatment for this condition depends on its severity and whether a bone has moved out of place. Treatment may involve:  Rest.  Wearing foot support such as a cast, splint, or boot for several weeks.  Using crutches.  Surgery to move bones back into the right position. Surgery is usually needed if there are many pieces of broken bone or bones that are very out of place (displaced fracture).  Physical therapy. This may be needed to help you regain full movement and strength in your foot. You will need to return to your health care provider to have X-rays taken until your bones heal. Your health care provider will look at the X-rays to make sure that your foot is healing well. HOME CARE INSTRUCTIONS  If You Have a Cast:  Do not stick anything inside the cast to scratch your skin. Doing that increases your risk of infection.  Check the skin around the cast every day. Report any concerns to your health care provider. You may put lotion on dry skin around the edges of the cast. Do not apply lotion to the skin underneath the cast.  Keep the cast clean and dry. If You Have a Splint  or a Supportive Boot:  Wear it as directed by your health care provider. Remove it only as directed by your health care provider.  Loosen it if your toes become numb and tingle, or if they turn cold and blue.  Keep it clean and dry. Bathing  Do not take baths, swim, or use a hot tub until your health care provider approves. Ask your health care provider if you can take showers. You may only be allowed to take sponge baths for bathing.  If your health care provider approves bathing and showering, cover the cast or splint with a watertight plastic bag to protect it from water. Do not let the cast or splint get wet. Managing Pain, Stiffness, and Swelling  If directed, apply ice to the injured area (if you have a splint, not a cast).  Put ice in a plastic bag.  Place a towel between your skin and the bag.  Leave the ice on for 20 minutes, 2-3 times per day.  Move your toes often to avoid stiffness and to lessen swelling.  Raise (elevate) the injured area above the level of your heart while you are sitting or lying down. Driving  Do not drive or operate heavy machinery while taking pain medicine.  Do not drive while wearing foot support on a foot that you use for driving. Activity  Return to your normal activities as directed by your health care   provider. Ask your health care provider what activities are safe for you.  Perform exercises as directed by your health care provider or physical therapist. Safety  Do not use the injured foot to support your body weight until your health care provider says that you can. Use crutches as directed by your health care provider. General Instructions  Do not put pressure on any part of the cast or splint until it is fully hardened. This may take several hours.  Do not use any tobacco products, including cigarettes, chewing tobacco, or e-cigarettes. Tobacco can delay bone healing. If you need help quitting, ask your health care  provider.  Take medicines only as directed by your health care provider.  Keep all follow-up visits as directed by your health care provider. This is important. SEEK MEDICAL CARE IF:  You have a fever.  Your cast, splint, or boot is too loose or too tight.  Your cast, splint, or boot is damaged.  Your pain medicine is not helping.  You have pain, tingling, or numbness in your foot that is not going away. SEEK IMMEDIATE MEDICAL CARE IF:  You have severe pain.  You have tingling or numbness in your foot that is getting worse.  Your foot feels cold or becomes numb.  Your foot changes color.   This information is not intended to replace advice given to you by your health care provider. Make sure you discuss any questions you have with your health care provider.   Document Released: 11/07/2001 Document Revised: 07/02/2014 Document Reviewed: 12/12/2013 Elsevier Interactive Patient Education 2016 [employer]  

## 2015-04-01 NOTE — Progress Notes (Signed)
   Subjective:    Patient ID: Alexandra Hunt, female    DOB: 1945/04/30, 70 y.o.   MRN: [id number]  PT presents to the office today with right foot pain. Pt states last night she got up to use the restroom and hit it on a rocking chair. Pt states she is having 5-10 out 10. Pt states she has not taken anything for the pain.  Foot Pain This is a new problem. The current episode started yesterday. The problem occurs constantly. The problem has been unchanged. Pertinent negatives include no chest pain or headaches. The symptoms are aggravated by standing and walking. She has tried nothing for the symptoms. The treatment provided no relief.      Review of Systems  Constitutional: Negative.   HENT: Negative.   Eyes: Negative.   Respiratory: Negative.  Negative for shortness of breath.   Cardiovascular: Negative.  Negative for chest pain and palpitations.  Gastrointestinal: Negative.   Endocrine: Negative.   Genitourinary: Negative.   Musculoskeletal: Negative.   Neurological: Negative.  Negative for headaches.  Hematological: Negative.   Psychiatric/Behavioral: Negative.   All other systems reviewed and are negative.      Objective:   Physical Exam  Constitutional: She is oriented to person, place, and time. She appears well-developed and well-nourished. No distress.  HENT:  Head: Normocephalic and atraumatic.  Eyes: Pupils are equal, round, and reactive to light.  Neck: Normal range of motion. Neck supple. No thyromegaly present.  Cardiovascular: Normal rate, regular rhythm, normal heart sounds and intact distal pulses.   No murmur heard. Pulmonary/Chest: Effort normal and breath sounds normal. No respiratory distress. She has no wheezes.  Abdominal: Soft. Bowel sounds are normal. She exhibits no distension. There is no tenderness.  Musculoskeletal: Normal range of motion. She exhibits edema (mild in right foot) and tenderness.  Neurological: She is alert and oriented to person,  place, and time. She has normal reflexes. No cranial nerve deficit.  Skin: Skin is warm and dry. Bruising (right foot with mild swelling and moderate tenderness) noted.  Psychiatric: She has a normal mood and affect. Her behavior is normal. Judgment and thought content normal.  Vitals reviewed.   Foot x-ray- Metatarsal fracture of fifth joint-Preliminary reading by Evelina Dun, FNP [facility]   BP 129/59 mmHg  Pulse 90  Temp(Src) 97 F (36.1 C) (Oral)  Ht 5\' 4"  (1.626 m)  Wt 192 lb (87.091 kg)  BMI 32.94 kg/m2       Assessment & Plan:  1. Right foot pain - DG Foot Complete Right; Future  2. Metatarsal fracture, right, closed, initial encounter -keep elevated -Ice -Pt has Norco rx that she can take for pain -Be careful not to hit or reinjury foot -RTO prn - Post-op shoe - Ambulatory referral to [place], FNP

## 2015-04-01 NOTE — Telephone Encounter (Signed)
Christy aware

## 2015-04-08 DIAGNOSIS — S92911A Unspecified fracture of right toe(s), initial encounter for closed fracture: Secondary | ICD-10-CM | POA: Diagnosis not present

## 2015-04-10 NOTE — Addendum Note (Signed)
Addended by: Rudean Hitt on: 04/10/2015 02:40 PM   Modules accepted: Level of Service, SmartSet

## 2015-04-10 NOTE — Progress Notes (Signed)
This encounter was created in error - please disregard.

## 2015-04-14 ENCOUNTER — Ambulatory Visit (INDEPENDENT_AMBULATORY_CARE_PROVIDER_SITE_OTHER): Payer: Commercial Managed Care - HMO | Admitting: Ophthalmology

## 2015-04-14 DIAGNOSIS — E113311 Type 2 diabetes mellitus with moderate nonproliferative diabetic retinopathy with macular edema, right eye: Secondary | ICD-10-CM | POA: Diagnosis not present

## 2015-04-14 DIAGNOSIS — E113212 Type 2 diabetes mellitus with mild nonproliferative diabetic retinopathy with macular edema, left eye: Secondary | ICD-10-CM

## 2015-04-14 DIAGNOSIS — E11311 Type 2 diabetes mellitus with unspecified diabetic retinopathy with macular edema: Secondary | ICD-10-CM

## 2015-04-14 DIAGNOSIS — H35033 Hypertensive retinopathy, bilateral: Secondary | ICD-10-CM

## 2015-04-14 DIAGNOSIS — I10 Essential (primary) hypertension: Secondary | ICD-10-CM

## 2015-04-14 DIAGNOSIS — H43813 Vitreous degeneration, bilateral: Secondary | ICD-10-CM

## 2015-04-14 DIAGNOSIS — H35372 Puckering of macula, left eye: Secondary | ICD-10-CM | POA: Diagnosis not present

## 2015-04-14 LAB — HM DIABETES EYE EXAM

## 2015-04-16 ENCOUNTER — Other Ambulatory Visit (INDEPENDENT_AMBULATORY_CARE_PROVIDER_SITE_OTHER): Payer: Commercial Managed Care - HMO | Admitting: Ophthalmology

## 2015-04-16 ENCOUNTER — Other Ambulatory Visit: Payer: Self-pay | Admitting: Nurse Practitioner

## 2015-04-17 ENCOUNTER — Encounter: Payer: Self-pay | Admitting: *Deleted

## 2015-04-18 ENCOUNTER — Ambulatory Visit: Payer: Self-pay | Admitting: Nurse Practitioner

## 2015-04-22 ENCOUNTER — Telehealth: Payer: Self-pay

## 2015-04-22 NOTE — Telephone Encounter (Signed)
FYI   Patient did not show for appt. Or call to cancel.   Will not be able to be rescheduled with them

## 2015-04-23 ENCOUNTER — Other Ambulatory Visit (INDEPENDENT_AMBULATORY_CARE_PROVIDER_SITE_OTHER): Payer: Commercial Managed Care - HMO | Admitting: Ophthalmology

## 2015-04-23 NOTE — Telephone Encounter (Signed)
Thanks for trying, he has we will have to wait until they give [place] a number or call.

## 2015-05-02 ENCOUNTER — Other Ambulatory Visit (INDEPENDENT_AMBULATORY_CARE_PROVIDER_SITE_OTHER): Payer: Commercial Managed Care - HMO | Admitting: Ophthalmology

## 2015-05-02 DIAGNOSIS — E113311 Type 2 diabetes mellitus with moderate nonproliferative diabetic retinopathy with macular edema, right eye: Secondary | ICD-10-CM

## 2015-05-02 DIAGNOSIS — E11311 Type 2 diabetes mellitus with unspecified diabetic retinopathy with macular edema: Secondary | ICD-10-CM | POA: Diagnosis not present

## 2015-05-05 ENCOUNTER — Ambulatory Visit (INDEPENDENT_AMBULATORY_CARE_PROVIDER_SITE_OTHER): Payer: Commercial Managed Care - HMO

## 2015-05-05 ENCOUNTER — Ambulatory Visit (INDEPENDENT_AMBULATORY_CARE_PROVIDER_SITE_OTHER): Payer: Commercial Managed Care - HMO | Admitting: Nurse Practitioner

## 2015-05-05 ENCOUNTER — Encounter: Payer: Self-pay | Admitting: Nurse Practitioner

## 2015-05-05 VITALS — BP 144/63 | HR 83 | Temp 97.8°F | Ht 64.0 in | Wt 187.0 lb

## 2015-05-05 DIAGNOSIS — Z13820 Encounter for screening for osteoporosis: Secondary | ICD-10-CM

## 2015-05-05 DIAGNOSIS — Z6832 Body mass index (BMI) 32.0-32.9, adult: Secondary | ICD-10-CM

## 2015-05-05 DIAGNOSIS — F411 Generalized anxiety disorder: Secondary | ICD-10-CM

## 2015-05-05 DIAGNOSIS — E1142 Type 2 diabetes mellitus with diabetic polyneuropathy: Secondary | ICD-10-CM

## 2015-05-05 DIAGNOSIS — F32A Depression, unspecified: Secondary | ICD-10-CM

## 2015-05-05 DIAGNOSIS — F329 Major depressive disorder, single episode, unspecified: Secondary | ICD-10-CM | POA: Diagnosis not present

## 2015-05-05 DIAGNOSIS — Z794 Long term (current) use of insulin: Secondary | ICD-10-CM

## 2015-05-05 DIAGNOSIS — E785 Hyperlipidemia, unspecified: Secondary | ICD-10-CM | POA: Diagnosis not present

## 2015-05-05 DIAGNOSIS — E781 Pure hyperglyceridemia: Secondary | ICD-10-CM | POA: Diagnosis not present

## 2015-05-05 DIAGNOSIS — I10 Essential (primary) hypertension: Secondary | ICD-10-CM

## 2015-05-05 DIAGNOSIS — E119 Type 2 diabetes mellitus without complications: Secondary | ICD-10-CM | POA: Diagnosis not present

## 2015-05-05 DIAGNOSIS — R609 Edema, unspecified: Secondary | ICD-10-CM | POA: Diagnosis not present

## 2015-05-05 LAB — BAYER DCA HB A1C WAIVED: HB A1C: 6.7 % (ref <7.0)

## 2015-05-05 MED ORDER — FUROSEMIDE 20 MG PO TABS: 20.0000 mg | ORAL_TABLET | Freq: Every day | ORAL | Status: DC

## 2015-05-05 MED ORDER — SERTRALINE HCL 100 MG PO TABS: 150.0000 mg | ORAL_TABLET | Freq: Every day | ORAL | Status: DC

## 2015-05-05 MED ORDER — METFORMIN HCL 500 MG PO TABS
500.0000 mg | ORAL_TABLET | Freq: Two times a day (BID) | ORAL | Status: DC
Start: 2015-05-05 — End: 2015-09-08

## 2015-05-05 MED ORDER — FENOFIBRATE 160 MG PO TABS: ORAL_TABLET | ORAL | Status: DC

## 2015-05-05 MED ORDER — LORAZEPAM 1 MG PO TABS: 1.0000 mg | ORAL_TABLET | Freq: Two times a day (BID) | ORAL | Status: DC | PRN

## 2015-05-05 MED ORDER — INSULIN GLARGINE 300 UNIT/ML ~~LOC~~ SOPN: 60.0000 [IU] | PEN_INJECTOR | Freq: Two times a day (BID) | SUBCUTANEOUS | Status: DC

## 2015-05-05 NOTE — Patient Instructions (Signed)
Diabetes and Foot Care Diabetes may cause you to have problems because of poor blood supply (circulation) to your feet and legs. This may cause the skin on your feet to become thinner, break easier, and heal more slowly. Your skin may become dry, and the skin may peel and crack. You may also have nerve damage in your legs and feet causing decreased feeling in them. You may not notice minor injuries to your feet that could lead to infections or more serious problems. Taking care of your feet is one of the most important things you can do for yourself.  HOME CARE INSTRUCTIONS  Wear shoes at all times, even in the house. Do not go barefoot. Bare feet are easily injured.  Check your feet daily for blisters, cuts, and redness. If you cannot see the bottom of your feet, use a mirror or ask someone for help.  Wash your feet with warm water (do not use hot water) and mild soap. Then pat your feet and the areas between your toes until they are completely dry. Do not soak your feet as this can dry your skin.  Apply a moisturizing lotion or petroleum jelly (that does not contain alcohol and is unscented) to the skin on your feet and to dry, brittle toenails. Do not apply lotion between your toes.  Trim your toenails straight across. Do not dig under them or around the cuticle. File the edges of your nails with an emery board or nail file.  Do not cut corns or calluses or try to remove them with medicine.  Wear clean socks or stockings every day. Make sure they are not too tight. Do not wear knee-high stockings since they may decrease blood flow to your legs.  Wear shoes that fit properly and have enough cushioning. To break in new shoes, wear them for just a few hours a day. This prevents you from injuring your feet. Always look in your shoes before you put them on to be sure there are no objects inside.  Do not cross your legs. This may decrease the blood flow to your feet.  If you find a minor scrape,  cut, or break in the skin on your feet, keep it and the skin around it clean and dry. These areas may be cleansed with mild soap and water. Do not cleanse the area with peroxide, alcohol, or iodine.  When you remove an adhesive bandage, be sure not to damage the skin around it.  If you have a wound, look at it several times a day to make sure it is healing.  Do not use heating pads or hot water bottles. They may burn your skin. If you have lost feeling in your feet or legs, you may not know it is happening until it is too late.  Make sure your health care provider performs a complete foot exam at least annually or more often if you have foot problems. Report any cuts, sores, or bruises to your health care provider immediately. SEEK MEDICAL CARE IF:   You have an injury that is not healing.  You have cuts or breaks in the skin.  You have an ingrown nail.  You notice redness on your legs or feet.  You feel burning or tingling in your legs or feet.  You have pain or cramps in your legs and feet.  Your legs or feet are numb.  Your feet always feel cold. SEEK IMMEDIATE MEDICAL CARE IF:   There is increasing redness,   swelling, or pain in or around a wound.  There is a red line that goes up your leg.  Pus is coming from a wound.  You develop a fever or as directed by your health care provider.  You notice a bad smell coming from an ulcer or wound.   This information is not intended to replace advice given to you by your health care provider. Make sure you discuss any questions you have with your health care provider.   Document Released: 02/13/2000 Document Revised: 10/18/2012 Document Reviewed: 07/25/2012 Elsevier Interactive Patient Education 2016 Elsevier Inc.  

## 2015-05-05 NOTE — Progress Notes (Signed)
Subjective:    Patient ID: Alexandra Hunt, female    DOB: 10-04-1945, 70 y.o.   MRN: [id number]  Patient here today for follow up of chronic medical problems.  Outpatient Encounter Prescriptions as of 05/05/2015  Medication Sig  . aspirin 81 MG tablet Take 81 mg by mouth daily.  . B-D INS SYR ULTRAFINE 1CC/31G 31G X 5/16" 1 ML MISC USE DAILY WITH LANTUS AND HUMALOG INSULIN SLIDING SCALES AS INSTRUCTED  . Dulaglutide (TRULICITY) 0.75 MG/0.5ML SOPN Inject 0.75 mg into the skin once a week.  . fenofibrate 160 MG tablet TAKE ONE TABLET BY MOUTH ONE TIME DAILY  . furosemide (LASIX) 20 MG tablet Take 1 tablet (20 mg total) by mouth daily.  Marland Kitchen HYDROcodone-acetaminophen (NORCO/VICODIN) 5-325 MG tablet Take 1 tablet by mouth as needed for moderate pain.  . Insulin Pen Needle (BD PEN NEEDLE NANO U/F) 32G X 4 MM MISC Use up to 6 times daily with toujeo and humalog.  . insulin regular (NOVOLIN R,HUMULIN R) 100 units/mL injection Inject into the skin. Sliding scale  . lisinopril (PRINIVIL,ZESTRIL) 20 MG tablet Take 1 tablet (20 mg total) by mouth daily.  Marland Kitchen LORazepam (ATIVAN) 1 MG tablet TAKE ONE TABLET BY MOUTH TWICE DAILY AS NEEDED  . metFORMIN (GLUCOPHAGE) 500 MG tablet Take 1 tablet (500 mg total) by mouth 2 (two) times daily with a meal.  . Multiple Vitamins-Minerals (THRIVE FOR LIFE WOMENS PO) Take by mouth daily.  . ondansetron (ZOFRAN) 4 MG tablet   . ONE TOUCH ULTRA TEST test strip USE TO TEST BLOOD SUGAR AT LEAST FOUR TIMES A DAY (ON SLIDING SCALE)  . pseudoephedrine (SUDAFED) 30 MG tablet Take 30 mg by mouth 2 (two) times daily. Reported on 04/01/2015  . sertraline (ZOLOFT) 100 MG tablet Take 1.5 tablets (150 mg total) by mouth daily.  . TOUJEO SOLOSTAR 300 UNIT/ML SOPN INJECT 60 UNITS INTO THE SKIN 2 (TWO) TIMES DAILY.  . [DISCONTINUED] doxycycline (MONODOX) 100 MG capsule Take 1 capsule (100 mg total) by mouth 2 (two) times daily.   No facility-administered encounter medications on file as of  05/05/2015.   * Patient is very noncompliant with diabetic meds- she saw Clinical pharmacist on 03/14/15 and was switched from lantus to toujeo- she says that her blood sugars have gotten much better. * broke her right foot 4 weeks ago walking around in the dark- Is seeing ortho.  Diabetes She presents for her follow-up diabetic visit. She has type 1 diabetes mellitus. No MedicAlert identification noted. Her disease course has been fluctuating. Hypoglycemia symptoms include headaches. Associated symptoms include foot paresthesias and weakness. Pertinent negatives for diabetes include no foot ulcerations. There are no hypoglycemic complications. Diabetic complications include nephropathy, peripheral neuropathy and retinopathy. Pertinent negatives for diabetic complications include no CVA. Risk factors for coronary artery disease include diabetes mellitus, dyslipidemia, hypertension and post-menopausal. She is compliant with treatment some of the time. Her weight is stable. When asked about meal planning, she reported none. She has not had a previous visit with a dietitian. She rarely participates in exercise. Her home blood glucose trend is fluctuating dramatically. Her breakfast blood glucose is taken between 8-9 am. Her breakfast blood glucose range is generally 130-140 mg/dl. Her highest blood glucose is >200 mg/dl. Her overall blood glucose range is 130-140 mg/dl. An ACE inhibitor/angiotensin II receptor blocker is being taken. She does not see a podiatrist.Eye exam is not current.  Hypertension This is a chronic problem. The problem has been waxing and waning  since onset. The problem is resistant. Associated symptoms include headaches. Pertinent negatives include no malaise/fatigue or peripheral edema. Risk factors for coronary artery disease include diabetes mellitus, dyslipidemia and post-menopausal state. Past treatments include ACE inhibitors. Hypertensive end-organ damage includes retinopathy. There is  no history of CVA.  Hyperlipidemia This is a chronic problem. The current episode started more than 1 year ago. The problem is controlled. Recent lipid tests were reviewed and are variable. Exacerbating diseases include diabetes. Current antihyperlipidemic treatment includes statins. The current treatment provides mild improvement of lipids. Compliance problems include adherence to diet and adherence to exercise.  Risk factors for coronary artery disease include diabetes mellitus, dyslipidemia and post-menopausal.  Depression/GAD Currently taking Zoloft '100mg'$  qd and Ativan '1mg'$  bid.  Still having issues of stress and anxiety at times.  Her depression screen is elevated today. Sleep apnea Not using cpap machine- has appointment in November with pulmonologist  Review of Systems  Constitutional: Negative for malaise/fatigue.  Respiratory: Negative.   Cardiovascular: Negative.   Skin: Negative.   Neurological: Positive for weakness and headaches.  Psychiatric/Behavioral: Negative.   All other systems reviewed and are negative.      Objective:   Physical Exam  Constitutional: She is oriented to person, place, and time. She appears well-developed and well-nourished.  HENT:  Nose: Nose normal.  Mouth/Throat: Oropharynx is clear and moist.  Eyes: EOM are normal.  Neck: Trachea normal, normal range of motion and full passive range of motion without pain. Neck supple. No JVD present. Carotid bruit is not present. No thyromegaly present.  Cardiovascular: Normal rate, regular rhythm, normal heart sounds and intact distal pulses.  Exam reveals no gallop and no friction rub.   No murmur heard. Pulmonary/Chest: Effort normal and breath sounds normal.  Abdominal: Soft. Bowel sounds are normal. She exhibits no distension and no mass. There is no tenderness.  Musculoskeletal: Normal range of motion.       Arms: Lymphadenopathy:    She has no cervical adenopathy.  Neurological: She is alert and  oriented to person, place, and time. She has normal reflexes.  Skin: Skin is warm and dry.  4/4 bilateral foot monofilament  Psychiatric: She has a normal mood and affect. Her behavior is normal. Judgment and thought content normal.    BP 144/63 mmHg  Pulse 83  Temp(Src) 97.8 F (36.6 C) (Oral)  Ht '5\' 4"'$  (1.626 m)  Wt 187 lb (84.823 kg)  BMI 32.08 kg/m2  hgba1C- 6.7%     Assessment & Plan:  1. Essential hypertension, malignant Do not add salt o diet - CMP14+EGFR  2. Hyperlipemia Low fat diet - Lipid panel  3. Depression Stress management - sertraline (ZOLOFT) 100 MG tablet; Take 1.5 tablets (150 mg total) by mouth daily.  Dispense: 45 tablet; Refill: 1  4. GAD (generalized anxiety disorder) Stress management - LORazepam (ATIVAN) 1 MG tablet; Take 1 tablet (1 mg total) by mouth 2 (two) times daily as needed.  Dispense: 60 tablet; Refill: 1  5. BMI 32.0-32.9,adult Discussed diet and exercise for person with BMI >25 Will recheck weight in 3-6 months  6. Hypertriglyceridemia - fenofibrate 160 MG tablet; TAKE ONE TABLET BY MOUTH ONE TIME DAILY  Dispense: 30 tablet; Refill: 3  7. Peripheral edema Elevate legs when sitting - furosemide (LASIX) 20 MG tablet; Take 1 tablet (20 mg total) by mouth daily.  Dispense: 30 tablet; Refill: 3  8. Type 2 diabetes mellitus with diabetic polyneuropathy, with long-term current use of insulin (HCC) Continue  to watch carbs in diet - Bayer DCA Hb A1c Waived - sertraline (ZOLOFT) 100 MG tablet; Take 1.5 tablets (150 mg total) by mouth daily.  Dispense: 45 tablet; Refill: 1 - metFORMIN (GLUCOPHAGE) 500 MG tablet; Take 1 tablet (500 mg total) by mouth 2 (two) times daily with a meal.  Dispense: 60 tablet; Refill: 5 - Insulin Glargine (TOUJEO SOLOSTAR) 300 UNIT/ML SOPN; Inject 60 Units into the skin 2 (two) times daily.  Dispense: 18 pen; Refill: 1    Labs pending Health maintenance reviewed Diet and exercise encouraged Continue all  meds Follow up  In 3 months   [place], FNP

## 2015-05-05 NOTE — Addendum Note (Signed)
Addended by: Rolena Infante on: 05/05/2015 03:48 PM   Modules accepted: Orders

## 2015-05-06 LAB — CMP14+EGFR
A/G RATIO: 1.4 (ref 1.1–2.5)
ALT: 12 IU/L (ref 0–32)
AST: 13 IU/L (ref 0–40)
Albumin: 4.3 g/dL (ref 3.6–4.8)
Alkaline Phosphatase: 46 IU/L (ref 39–117)
BUN/Creatinine Ratio: 21 (ref 11–26)
BUN: 24 mg/dL (ref 8–27)
CHLORIDE: 104 mmol/L (ref 96–106)
CO2: 25 mmol/L (ref 18–29)
Calcium: 9.4 mg/dL (ref 8.7–10.3)
Creatinine, Ser: 1.14 mg/dL — ABNORMAL HIGH (ref 0.57–1.00)
GFR calc non Af Amer: 50 mL/min/{1.73_m2} — ABNORMAL LOW (ref >59)
GFR, EST AFRICAN AMERICAN: 57 mL/min/{1.73_m2} — AB (ref >59)
Globulin, Total: 3 g/dL (ref 1.5–4.5)
Glucose: 113 mg/dL — ABNORMAL HIGH (ref 65–99)
POTASSIUM: 4.8 mmol/L (ref 3.5–5.2)
SODIUM: 144 mmol/L (ref 134–144)
Total Protein: 7.3 g/dL (ref 6.0–8.5)

## 2015-05-06 LAB — LIPID PANEL
Chol/HDL Ratio: 3.8 ratio units (ref 0.0–4.4)
Cholesterol, Total: 125 mg/dL (ref 100–199)
HDL: 33 mg/dL — AB (ref >39)
LDL Calculated: 62 mg/dL (ref 0–99)
TRIGLYCERIDES: 149 mg/dL (ref 0–149)
VLDL Cholesterol Cal: 30 mg/dL (ref 5–40)

## 2015-05-12 ENCOUNTER — Ambulatory Visit: Payer: Commercial Managed Care - HMO | Admitting: Pharmacist

## 2015-05-13 ENCOUNTER — Other Ambulatory Visit: Payer: Self-pay | Admitting: Nurse Practitioner

## 2015-06-04 ENCOUNTER — Telehealth: Payer: Self-pay | Admitting: Family Medicine

## 2015-06-04 NOTE — Telephone Encounter (Signed)
Spoke with patient, somehow her Toujeo was pushed towards the back of her refrigerator and the medication has froze.  She said she is in the donut hole and cannot get the refill even if we sent it in.  I checked the refrigerator and we do not have any samples.  She would like to know what you recommend.  She still has Lantus at home.  Her cell number to get in touch with her is [phone].

## 2015-06-05 NOTE — Telephone Encounter (Signed)
Spoke with patient - gave her #2 samples for Basaglar which is similar to Lantus.  Also will do referral to [facility] while in [place] coverage gap.

## 2015-06-16 ENCOUNTER — Other Ambulatory Visit: Payer: Self-pay | Admitting: Nurse Practitioner

## 2015-06-16 NOTE — Telephone Encounter (Signed)
Last seen 05/05/15  MMM  Dr Dettinger is PCP  IF approved route to nurse [facility] into [facility]

## 2015-06-16 NOTE — Telephone Encounter (Signed)
This patient needs to decide who she wants her PCP to be because she just saw Shelah Lewandowsky on March 6 and had this refilled through [facility]. I have not seen her for this medication if she wants me to be her PCP then she needs to establish with me for this medication if not then we need to switch her PCP to Shelah Lewandowsky if she wants to continue there.

## 2015-06-17 NOTE — Telephone Encounter (Signed)
Ativan was filled on 05/08/15 with 1 refill- check to see if has refill at [facility]

## 2015-06-17 NOTE — Telephone Encounter (Signed)
Contacted [facility] and spoke with pharmacist, she checked and they do have a refill available to fill for patient.  She will go ahead and refill Lorazepam and contact patient for pick up

## 2015-06-23 ENCOUNTER — Telehealth: Payer: Self-pay | Admitting: Pharmacist

## 2015-06-23 NOTE — Telephone Encounter (Signed)
Patient wanted to let me know that she is meeting with Denyse Amass this week with PAP program.  BG is improving.

## 2015-07-09 ENCOUNTER — Ambulatory Visit (INDEPENDENT_AMBULATORY_CARE_PROVIDER_SITE_OTHER): Payer: Commercial Managed Care - HMO | Admitting: Pharmacist

## 2015-07-09 ENCOUNTER — Encounter (INDEPENDENT_AMBULATORY_CARE_PROVIDER_SITE_OTHER): Payer: Self-pay

## 2015-07-09 ENCOUNTER — Ambulatory Visit (INDEPENDENT_AMBULATORY_CARE_PROVIDER_SITE_OTHER): Payer: Commercial Managed Care - HMO | Admitting: Family Medicine

## 2015-07-09 ENCOUNTER — Encounter: Payer: Self-pay | Admitting: Family Medicine

## 2015-07-09 VITALS — BP 107/57 | HR 79 | Temp 97.9°F | Ht 64.0 in | Wt 188.0 lb

## 2015-07-09 DIAGNOSIS — R7989 Other specified abnormal findings of blood chemistry: Secondary | ICD-10-CM | POA: Diagnosis not present

## 2015-07-09 DIAGNOSIS — M8580 Other specified disorders of bone density and structure, unspecified site: Secondary | ICD-10-CM | POA: Insufficient documentation

## 2015-07-09 DIAGNOSIS — S90221A Contusion of right lesser toe(s) with damage to nail, initial encounter: Secondary | ICD-10-CM | POA: Diagnosis not present

## 2015-07-09 NOTE — Progress Notes (Signed)
Subjective:  Patient ID: Alexandra Hunt, female    DOB: 06-Dec-1945  Age: 70 y.o. MRN: [id number]  CC: Bleeding/Bruising   HPI Alexandra Hunt presents for both great toes were painted by grand daughter 4 days ago. Removed the polish today and discovered brown discoloration under the nails. NKI.   History Alexandra Hunt has a past medical history of Hypertension; Hyperlipidemia; Depression; Cervical radiculopathy; Cancer ([place]); Benign breast lumps; Diabetes mellitus without complication ([place]); and Sleep apnea.   She has past surgical history that includes Abdominal hysterectomy; Breast surgery; and Tubal ligation.   Her family history includes Blindness in her mother; Diabetes in her brother and mother; Heart attack in her father; Heart defect in her father; Heart disease in her father; Stroke in her mother.She reports that she has never smoked. She has never used smokeless tobacco. She reports that she does not drink alcohol or use illicit drugs.    ROS Review of Systems  Constitutional: Negative for fever and activity change.  Musculoskeletal: Negative for myalgias and arthralgias.  Hematological: Negative for adenopathy. Does not bruise/bleed easily.    Objective:  BP 107/57 mmHg  Pulse 79  Temp(Src) 97.9 F (36.6 C) (Oral)  Ht 5\' 4"  (1.626 m)  Wt 188 lb (85.276 kg)  BMI 32.25 kg/m2  SpO2 97%  BP Readings from Last 3 Encounters:  07/09/15 107/57  05/05/15 144/63  04/01/15 129/59    Wt Readings from Last 3 Encounters:  07/09/15 188 lb (85.276 kg)  05/05/15 187 lb (84.823 kg)  04/01/15 192 lb (87.091 kg)     Physical Exam  Constitutional: She appears well-developed and well-nourished.  HENT:  Head: Normocephalic.  Cardiovascular: Normal rate and regular rhythm.   No murmur heard. Pulmonary/Chest: Effort normal and breath sounds normal.  Musculoskeletal: Normal range of motion. She exhibits no tenderness.  Skin:  Mild subungual hematoma under each of the great nails.      Lab Results  Component Value Date   GLUCOSE 113* 05/05/2015   CHOL 125 05/05/2015   TRIG 149 05/05/2015   HDL 33* 05/05/2015   LDLCALC 62 05/05/2015   ALT 12 05/05/2015   AST 13 05/05/2015   NA 144 05/05/2015   K 4.8 05/05/2015   CL 104 05/05/2015   CREATININE 1.14* 05/05/2015   BUN 24 05/05/2015   CO2 25 05/05/2015   HGBA1C 8.8 12/20/2014    Mr Lumbar Spine Wo Contrast  12/22/2008  Clinical Data: Low back pain with radiculopathy.  Right leg pain.  MRI LUMBAR SPINE WITHOUT CONTRAST  Technique:  Multiplanar and multiecho pulse sequences of the lumbar spine were obtained without intravenous contrast.  Comparison: None.  Findings: Normal lumbar alignment.  Negative for fracture.  Bone marrow signal is heterogeneous but without focal mass lesion. Conus medullaris is normal and terminates at L1-2.  L1-2:  Negative  L2-3:  Mild disc and facet degeneration without disc protrusion or stenosis.  L3-4:  Mild disc and facet degeneration without disc protrusion or stenosis.  L5-S1:  Small right paracentral disc protrusion.  This extends into the lateral recess and  there may be impingement of the right L5 nerve root.  There is mild to moderate facet and ligamentum flavum hypertrophy.  There is mild spinal stenosis.  L5-S1:  Mild disc and facet degeneration.  IMPRESSION: Small right paracentral disc protrusion at L4-5 with impingement of the right L5 nerve root in the lateral recess.  There is mild spinal stenosis.  Mild disc and facet degeneration  at other levels as described above. Provider: Orpah Cobb   Assessment & Plan:   Alexandra Hunt was seen today for bleeding/bruising.  Diagnoses and all orders for this visit:  Subungual hematoma of toe of right foot, initial encounter  Low serum vitamin D -     VITAMIN D 25 Hydroxy (Vit-D Deficiency, Fractures)   Reassured for now. Monitor for spread.   I am having Alexandra Hunt maintain her aspirin, ONE TOUCH ULTRA TEST, B-D INS SYR ULTRAFINE  1CC/31G, ondansetron, pseudoephedrine, lisinopril, Insulin Pen Needle, Multiple Vitamins-Minerals (THRIVE FOR LIFE WOMENS PO), HYDROcodone-acetaminophen, insulin regular, fenofibrate, metFORMIN, furosemide, Insulin Glargine, LORazepam, and sertraline.  No orders of the defined types were placed in this encounter.     Follow-up: Return if symptoms worsen or fail to improve.  Alexandra Hunt, M.D.

## 2015-07-10 ENCOUNTER — Telehealth: Payer: Self-pay | Admitting: Pharmacist

## 2015-07-10 DIAGNOSIS — E1142 Type 2 diabetes mellitus with diabetic polyneuropathy: Secondary | ICD-10-CM

## 2015-07-10 DIAGNOSIS — Z794 Long term (current) use of insulin: Principal | ICD-10-CM

## 2015-07-10 LAB — VITAMIN D 25 HYDROXY (VIT D DEFICIENCY, FRACTURES): VIT D 25 HYDROXY: 17.1 ng/mL — AB (ref 30.0–100.0)

## 2015-07-10 MED ORDER — VITAMIN D (ERGOCALCIFEROL) 1.25 MG (50000 UNIT) PO CAPS: 50000.0000 [IU] | ORAL_CAPSULE | ORAL | Status: DC

## 2015-07-10 MED ORDER — INSULIN GLARGINE 300 UNIT/ML ~~LOC~~ SOPN: 60.0000 [IU] | PEN_INJECTOR | Freq: Two times a day (BID) | SUBCUTANEOUS | Status: DC

## 2015-07-10 NOTE — Telephone Encounter (Signed)
Patient notified of results and Rx for vitamin D sent to pharmacy

## 2015-07-10 NOTE — Progress Notes (Signed)
Reviewed DEXA and patient triaged to Dr Livia Snellen for toe injury

## 2015-08-05 ENCOUNTER — Ambulatory Visit: Payer: Commercial Managed Care - HMO | Admitting: Nurse Practitioner

## 2015-08-07 ENCOUNTER — Other Ambulatory Visit: Payer: Self-pay | Admitting: Nurse Practitioner

## 2015-08-07 DIAGNOSIS — F411 Generalized anxiety disorder: Secondary | ICD-10-CM

## 2015-08-07 MED ORDER — LORAZEPAM 1 MG PO TABS: 1.0000 mg | ORAL_TABLET | Freq: Two times a day (BID) | ORAL | Status: DC | PRN

## 2015-08-07 MED ORDER — HYDROCODONE-ACETAMINOPHEN 5-325 MG PO TABS: 1.0000 | ORAL_TABLET | Freq: Four times a day (QID) | ORAL | Status: DC | PRN

## 2015-08-07 NOTE — Telephone Encounter (Signed)
rx ready for pickup 

## 2015-08-07 NOTE — Telephone Encounter (Signed)
Last seen 05/05/15. Both will print

## 2015-08-16 ENCOUNTER — Other Ambulatory Visit: Payer: Self-pay | Admitting: Nurse Practitioner

## 2015-09-04 ENCOUNTER — Ambulatory Visit (INDEPENDENT_AMBULATORY_CARE_PROVIDER_SITE_OTHER): Payer: Commercial Managed Care - HMO | Admitting: Ophthalmology

## 2015-09-04 DIAGNOSIS — H35372 Puckering of macula, left eye: Secondary | ICD-10-CM | POA: Diagnosis not present

## 2015-09-04 DIAGNOSIS — E113391 Type 2 diabetes mellitus with moderate nonproliferative diabetic retinopathy without macular edema, right eye: Secondary | ICD-10-CM | POA: Diagnosis not present

## 2015-09-04 DIAGNOSIS — E113292 Type 2 diabetes mellitus with mild nonproliferative diabetic retinopathy without macular edema, left eye: Secondary | ICD-10-CM | POA: Diagnosis not present

## 2015-09-04 DIAGNOSIS — I10 Essential (primary) hypertension: Secondary | ICD-10-CM | POA: Diagnosis not present

## 2015-09-04 DIAGNOSIS — H43813 Vitreous degeneration, bilateral: Secondary | ICD-10-CM

## 2015-09-04 DIAGNOSIS — E11319 Type 2 diabetes mellitus with unspecified diabetic retinopathy without macular edema: Secondary | ICD-10-CM

## 2015-09-04 DIAGNOSIS — H35033 Hypertensive retinopathy, bilateral: Secondary | ICD-10-CM

## 2015-09-04 DIAGNOSIS — H2513 Age-related nuclear cataract, bilateral: Secondary | ICD-10-CM

## 2015-09-08 ENCOUNTER — Telehealth: Payer: Self-pay | Admitting: Family Medicine

## 2015-09-08 DIAGNOSIS — Z794 Long term (current) use of insulin: Principal | ICD-10-CM

## 2015-09-08 DIAGNOSIS — E1142 Type 2 diabetes mellitus with diabetic polyneuropathy: Secondary | ICD-10-CM

## 2015-09-08 MED ORDER — INSULIN GLARGINE 300 UNIT/ML ~~LOC~~ SOPN: 60.0000 [IU] | PEN_INJECTOR | Freq: Two times a day (BID) | SUBCUTANEOUS | Status: DC

## 2015-09-08 MED ORDER — METFORMIN HCL 500 MG PO TABS: 500.0000 mg | ORAL_TABLET | Freq: Two times a day (BID) | ORAL | Status: DC

## 2015-09-08 NOTE — Telephone Encounter (Signed)
Patient aware that medication has been sent [facility]

## 2015-09-14 ENCOUNTER — Other Ambulatory Visit: Payer: Self-pay | Admitting: Nurse Practitioner

## 2015-10-02 ENCOUNTER — Other Ambulatory Visit: Payer: Self-pay | Admitting: Pharmacist

## 2015-10-22 ENCOUNTER — Other Ambulatory Visit: Payer: Self-pay | Admitting: Family Medicine

## 2015-11-22 ENCOUNTER — Other Ambulatory Visit: Payer: Self-pay | Admitting: Nurse Practitioner

## 2015-11-23 ENCOUNTER — Other Ambulatory Visit: Payer: Self-pay | Admitting: Nurse Practitioner

## 2015-11-27 ENCOUNTER — Ambulatory Visit (INDEPENDENT_AMBULATORY_CARE_PROVIDER_SITE_OTHER): Payer: Commercial Managed Care - HMO

## 2015-11-27 ENCOUNTER — Telehealth: Payer: Self-pay | Admitting: Nurse Practitioner

## 2015-11-27 ENCOUNTER — Encounter: Payer: Self-pay | Admitting: Family Medicine

## 2015-11-27 ENCOUNTER — Other Ambulatory Visit: Payer: Self-pay | Admitting: Nurse Practitioner

## 2015-11-27 ENCOUNTER — Ambulatory Visit (INDEPENDENT_AMBULATORY_CARE_PROVIDER_SITE_OTHER): Payer: Commercial Managed Care - HMO | Admitting: Family Medicine

## 2015-11-27 VITALS — BP 142/68 | HR 89 | Temp 97.6°F | Ht 64.0 in | Wt 197.2 lb

## 2015-11-27 DIAGNOSIS — M1711 Unilateral primary osteoarthritis, right knee: Secondary | ICD-10-CM

## 2015-11-27 DIAGNOSIS — M25561 Pain in right knee: Secondary | ICD-10-CM | POA: Diagnosis not present

## 2015-11-27 DIAGNOSIS — Z79899 Other long term (current) drug therapy: Secondary | ICD-10-CM | POA: Diagnosis not present

## 2015-11-27 DIAGNOSIS — M542 Cervicalgia: Secondary | ICD-10-CM

## 2015-11-27 DIAGNOSIS — F1120 Opioid dependence, uncomplicated: Secondary | ICD-10-CM | POA: Diagnosis not present

## 2015-11-27 DIAGNOSIS — F411 Generalized anxiety disorder: Secondary | ICD-10-CM

## 2015-11-27 MED ORDER — HYDROCODONE-ACETAMINOPHEN 5-325 MG PO TABS: 1.0000 | ORAL_TABLET | Freq: Four times a day (QID) | ORAL | 0 refills | Status: DC | PRN

## 2015-11-27 NOTE — Telephone Encounter (Signed)
Discussed control substance policy and cannot be on pain meds and ativan at sametime- patient told will have to wait for pain meds to run out before can get rx for ativan.

## 2015-11-27 NOTE — Patient Instructions (Signed)
Great to see you!  Please come back to see Alexandra Hunt in 2 weeks to discuss your anxiety. We will call with x ray results

## 2015-11-27 NOTE — Progress Notes (Signed)
HPI  Patient presents today here requesting refill of hydrocodone and Ativan.  Patient complains of right knee pain, left neck pain, left shoulder pain, and severe anxiety.  States that 5 mg of hydrocodone helps but does not help enough, she requests increase.  She also states that she takes Ativan twice daily for anxiety, she requests 3 times daily. She states that she has been out of Ativan for 2 weeks, after we discussed A urine drug screen today she admits to taking one of her daughters this am. Regardless she states that she has been out of Ativan for 2 weeks.  Right knee pain States it feels like "there is a screwdriver being driven into my kneecap". She's had knee pain since 2012, per her description, described as generalized knee pain that's very severe, getting worse over the last few months. She is willing to see a surgeon and states that she has seen a surgeon previously who stated it was not time for surgery for her.  Neck pain Left-sided neck pain with radiation down to her distal deltoid muscle, to about the level of her proximal upper half of her upper arm. Described as dull and achy, worse with movement and activities. No loss of strength. After coming helps but is not enough.   PMH: Smoking status noted ROS: Per HPI  Objective: BP (!) 142/68   Pulse 89   Temp 97.6 F (36.4 C) (Oral)   Ht 5\' 4"  (1.626 m)   Wt 197 lb 3.2 oz (89.4 kg)   BMI 33.85 kg/m  Gen: NAD, alert, cooperative with exam HEENT: NCAT CV: RRR, good S1/S2, no murmur Resp: CTABL, no wheezes, non-labored Ext: No edema, warm Neuro: Alert and oriented, No gross deficits  MSK: R knee without erythema, effusion, bruising, or gross deformity No joint line tenderness.  ligamentously intact to Lachman's and with varus and valgus stress.  Negative McMurray's test  Tenderness to palpation of the left-sided paraspinal muscles in the cervical area, also tenderness with palpation of the trapezius  muscle    MR c Spine 12/26/2010 shows C6/C7 Disc buldge   X-ray today shows narrowing of the medial compartment of her right knee with bone spurs about the patella  Assessment and plan:  # Anxiety Uncontrolled, patient states that she's been out of benzodiazepines for 2 weeks. I explained that in general he tried not to treat with benzodiazepines plus narcotics. When given the choice she chose to have hydrocodone instead of Ativan. I feel that she has not at severe risk for withdrawal given that she has not taken it in 2 weeks, except for her daughter's dose this morning. Given that she has chronic controlled substance use and admits to taking her daughter's pill this morning I have asked her to provide a urine drug screen today.  # Right knee pain Evidence of long-standing arthritis on x-ray Physical exam is reassuring Given a small amount of hydrocodone for pain. Would also benefit from knee injection, I have written her an orthopedics referral  # Neck pain Mild radiculopathy MR from 2012 shows bulging disc Recommended evaluation with orthopedics for this as well, she likely needs repeat MRI. She states that she did have an MRI a recent ER visit, however I cannot find the images in PACS ([place])     Orders Placed This Encounter  Procedures  . DG Knee 1-2 Views Right    Standing Status:   Future    Number of Occurrences:   1  Standing Expiration Date:   01/26/2017    Order Specific Question:   Reason for Exam (SYMPTOM  OR DIAGNOSIS REQUIRED)    Answer:   R knee pain    Order Specific Question:   Preferred imaging location?    Answer:   Internal  . ToxASSURE Select 13 ([facility]), Urine  . Ambulatory referral to Orthopedic Surgery    Referral Priority:   Routine    Referral Type:   Surgical    Referral Reason:   Specialty Services Required    Requested Specialty:   Orthopedic Surgery    Number of Visits Requested:   1    Meds ordered this encounter  Medications  .  HYDROcodone-acetaminophen (NORCO/VICODIN) 5-325 MG tablet    Sig: Take 1 tablet by mouth every 6 (six) hours as needed for moderate pain.    Dispense:  30 tablet    Refill:  0    Laroy Apple, MD [facility] 11/27/2015, 11:07 AM

## 2015-12-01 ENCOUNTER — Other Ambulatory Visit: Payer: Self-pay | Admitting: Nurse Practitioner

## 2015-12-01 DIAGNOSIS — F411 Generalized anxiety disorder: Secondary | ICD-10-CM

## 2015-12-01 MED ORDER — LORAZEPAM 1 MG PO TABS: 1.0000 mg | ORAL_TABLET | Freq: Two times a day (BID) | ORAL | 1 refills | Status: DC | PRN

## 2015-12-01 NOTE — Progress Notes (Signed)
Patient brough Hydrocoone rx back to office because was told could not be on ativan and pain meds- says that she needs her ativan more.

## 2015-12-02 ENCOUNTER — Encounter: Payer: Self-pay | Admitting: Nurse Practitioner

## 2015-12-02 ENCOUNTER — Ambulatory Visit (INDEPENDENT_AMBULATORY_CARE_PROVIDER_SITE_OTHER): Payer: Commercial Managed Care - HMO | Admitting: Nurse Practitioner

## 2015-12-02 VITALS — BP 121/55 | HR 80 | Temp 97.3°F | Ht 64.0 in | Wt 194.0 lb

## 2015-12-02 DIAGNOSIS — Z794 Long term (current) use of insulin: Secondary | ICD-10-CM

## 2015-12-02 DIAGNOSIS — M8580 Other specified disorders of bone density and structure, unspecified site: Secondary | ICD-10-CM

## 2015-12-02 DIAGNOSIS — F3342 Major depressive disorder, recurrent, in full remission: Secondary | ICD-10-CM | POA: Diagnosis not present

## 2015-12-02 DIAGNOSIS — M25562 Pain in left knee: Secondary | ICD-10-CM

## 2015-12-02 DIAGNOSIS — E785 Hyperlipidemia, unspecified: Secondary | ICD-10-CM | POA: Diagnosis not present

## 2015-12-02 DIAGNOSIS — Z23 Encounter for immunization: Secondary | ICD-10-CM

## 2015-12-02 DIAGNOSIS — M25561 Pain in right knee: Secondary | ICD-10-CM | POA: Diagnosis not present

## 2015-12-02 DIAGNOSIS — I10 Essential (primary) hypertension: Secondary | ICD-10-CM

## 2015-12-02 DIAGNOSIS — Z6832 Body mass index (BMI) 32.0-32.9, adult: Secondary | ICD-10-CM

## 2015-12-02 DIAGNOSIS — F411 Generalized anxiety disorder: Secondary | ICD-10-CM | POA: Diagnosis not present

## 2015-12-02 DIAGNOSIS — E1142 Type 2 diabetes mellitus with diabetic polyneuropathy: Secondary | ICD-10-CM | POA: Diagnosis not present

## 2015-12-02 LAB — BAYER DCA HB A1C WAIVED: HB A1C: 9.3 % — AB (ref <7.0)

## 2015-12-02 MED ORDER — INSULIN GLARGINE 300 UNIT/ML ~~LOC~~ SOPN: 60.0000 [IU] | PEN_INJECTOR | Freq: Two times a day (BID) | SUBCUTANEOUS | 5 refills | Status: DC

## 2015-12-02 MED ORDER — METFORMIN HCL 500 MG PO TABS: 500.0000 mg | ORAL_TABLET | Freq: Two times a day (BID) | ORAL | 5 refills | Status: DC

## 2015-12-02 MED ORDER — FUROSEMIDE 20 MG PO TABS: ORAL_TABLET | ORAL | 5 refills | Status: DC

## 2015-12-02 MED ORDER — LISINOPRIL 20 MG PO TABS: 20.0000 mg | ORAL_TABLET | Freq: Every day | ORAL | 5 refills | Status: DC

## 2015-12-02 MED ORDER — SERTRALINE HCL 100 MG PO TABS: ORAL_TABLET | ORAL | 5 refills | Status: DC

## 2015-12-02 MED ORDER — FENOFIBRATE 160 MG PO TABS: 160.0000 mg | ORAL_TABLET | Freq: Every day | ORAL | 5 refills | Status: DC

## 2015-12-02 NOTE — Patient Instructions (Signed)

## 2015-12-02 NOTE — Progress Notes (Signed)
Subjective:    Patient ID: Alexandra Hunt, female    DOB: 01/27/1946, 70 y.o.   MRN: [id number]  Patient here today for follow up of chronic medical problems.  Outpatient Encounter Prescriptions as of 12/02/2015  Medication Sig  . aspirin 81 MG tablet Take 81 mg by mouth daily.  . B-D INS SYR ULTRAFINE 1CC/31G 31G X 5/16" 1 ML MISC USE DAILY WITH LANTUS AND HUMALOG INSULIN SLIDING SCALES AS INSTRUCTED  . B-D UF III MINI PEN NEEDLES 31G X 5 MM MISC USE WITH SLIDING SCALE HUMALOG 4 TIMES DAILY  . fenofibrate 160 MG tablet TAKE ONE TABLET BY MOUTH ONE TIME DAILY  . furosemide (LASIX) 20 MG tablet TAKE 1 TABLET (20 MG TOTAL) BY MOUTH DAILY.  . HYDROcodone-acetaminophen (NORCO/VICODIN) 5-325 MG tablet Take 1 tablet by mouth every 6 (six) hours as needed for moderate pain.  . Insulin Glargine (TOUJEO SOLOSTAR) 300 UNIT/ML SOPN Inject 60 Units into the skin 2 (two) times daily.  . Insulin Pen Needle (BD PEN NEEDLE NANO U/F) 32G X 4 MM MISC Use up to 6 times daily with toujeo and humalog.  . insulin regular (NOVOLIN R,HUMULIN R) 100 units/mL injection Inject into the skin. Sliding scale  . lisinopril (PRINIVIL,ZESTRIL) 20 MG tablet Take 1 tablet (20 mg total) by mouth daily.  . LORazepam (ATIVAN) 1 MG tablet Take 1 tablet (1 mg total) by mouth 2 (two) times daily as needed.  . metFORMIN (GLUCOPHAGE) 500 MG tablet Take 1 tablet (500 mg total) by mouth 2 (two) times daily with a meal.  . Multiple Vitamins-Minerals (THRIVE FOR LIFE WOMENS PO) Take by mouth daily.  . ondansetron (ZOFRAN) 4 MG tablet   . ONE TOUCH ULTRA TEST test strip USE TO TEST BLOOD SUGAR AT LEAST FOUR TIMES A DAY (ON SLIDING SCALE)  . pseudoephedrine (SUDAFED) 30 MG tablet Take 30 mg by mouth 2 (two) times daily. Reported on 04/01/2015  . sertraline (ZOLOFT) 100 MG tablet TAKE 1 & 1/2 TABLET BY MOUTH EVERY DAY  . Vitamin D, Ergocalciferol, (DRISDOL) 50000 units CAPS capsule TAKE 1 CAPSULE (50,000 UNITS TOTAL) BY MOUTH EVERY 7 (SEVEN)  DAYS.   No facility-administered encounter medications on file as of 12/02/2015.    * Patient saw Dr. Bradshaw last week requesting her ativan and an increase in herpain meds- he would not give her both so she choose pain meds- decided that she needed her ativan more so she brought rx back and ask for ativan refill. SHe was told of new pain policy and she understands.  Diabetes  She presents for her follow-up diabetic visit. She has type 2 diabetes mellitus. No MedicAlert identification noted. The initial diagnosis of diabetes was made 15 years ago. Pertinent negatives for diabetes include no blurred vision. There are no hypoglycemic complications. Symptoms are stable. Diabetic complications include nephropathy and retinopathy. Risk factors for coronary artery disease include dyslipidemia, hypertension, obesity and post-menopausal. Current diabetic treatment includes oral agent (monotherapy) and insulin injections (insulin was changed from lantus to levimir at last visit. Blood sugars are not as good as they were.). She is compliant with treatment some of the time. She is currently taking insulin at bedtime (lantus- and does sliding scale with meals). Insulin injections are given by patient. Rotation sites for injection include the abdominal wall. Her weight is stable. Diabetic current diet: patient is very noncompliant with her diet . When asked about meal planning, she reported none. She has not had a previous visit with a   dietitian. She rarely participates in exercise. Her home blood glucose trend is fluctuating minimally. Her breakfast blood glucose is taken between 7-8 am. Her breakfast blood glucose range is generally 130-140 mg/dl. Her lunch blood glucose is taken between 1-2 pm. Her lunch blood glucose range is generally 180-200 mg/dl. Her dinner blood glucose is taken between 7-8 pm. Her dinner blood glucose range is generally >200 mg/dl. Her highest blood glucose is >200 mg/dl. Her overall blood  glucose range is 140-180 mg/dl. An ACE inhibitor/angiotensin II receptor blocker is being taken. She does not see a podiatrist.Eye exam is not current.  Hypertension  This is a chronic problem. The current episode started more than 1 year ago. The problem has been resolved since onset. The problem is controlled. Pertinent negatives include no blurred vision, orthopnea or peripheral edema. There are no associated agents to hypertension. Risk factors for coronary artery disease include diabetes mellitus, dyslipidemia, post-menopausal state and sedentary lifestyle. Past treatments include diuretics. The current treatment provides mild improvement. Compliance problems include diet.  Hypertensive end-organ damage includes kidney disease, CAD/MI and retinopathy.  Hyperlipidemia  This is a chronic problem. The current episode started more than 1 year ago. The problem is controlled. Recent lipid tests were reviewed and are normal. Exacerbating diseases include diabetes. She has no history of obesity. Current antihyperlipidemic treatment includes statins. The current treatment provides moderate improvement of lipids.  Depression/GAD Currently taking Zoloft 151m qd and Ativan 144mbid.  Still having issues of stress and anxiety at times.  She is under a lot of stress and does not take care of herself at all.  * Having lots of knee and shoulder pain and has appointment with [facility] oct 26, but needs referral  Review of Systems  Constitutional: Negative.   Eyes: Negative for blurred vision.  Respiratory: Negative.   Cardiovascular: Negative.  Negative for orthopnea.  Skin: Negative.   Neurological: Negative.   Psychiatric/Behavioral: Negative.   All other systems reviewed and are negative.      Objective:   Physical Exam  Constitutional: She is oriented to person, place, and time. She appears well-developed and well-nourished.  HENT:  Nose: Nose normal.  Mouth/Throat: Oropharynx is clear and  moist.  Eyes: EOM are normal.  Neck: Trachea normal, normal range of motion and full passive range of motion without pain. Neck supple. No JVD present. Carotid bruit is not present. No thyromegaly present.  Cardiovascular: Normal rate, regular rhythm, normal heart sounds and intact distal pulses.  Exam reveals no gallop and no friction rub.   No murmur heard. Pulmonary/Chest: Effort normal and breath sounds normal.  Abdominal: Soft. Bowel sounds are normal. She exhibits no distension and no mass. There is no tenderness.  Musculoskeletal: Normal range of motion.       Arms: Lymphadenopathy:    She has no cervical adenopathy.  Neurological: She is alert and oriented to person, place, and time. She has normal reflexes.  Skin: Skin is warm and dry.  4/4 bilateral foot monofilament  Psychiatric: She has a normal mood and affect. Her behavior is normal. Judgment and thought content normal.    BP (!) 121/55   Pulse 80   Temp 97.3 F (36.3 C) (Oral)   Ht 5' 4" (1.626 m)   Wt 194 lb (88 kg)   BMI 33.30 kg/m   Hgba1c 9.3     Assessment & Plan:   1. Type 2 diabetes mellitus with diabetic polyneuropathy, with long-term current use of insulin ([facility]  Stricter carb counting Must get blood sugars under control - Bayer DCA Hb A1c Waived - Insulin Glargine (TOUJEO SOLOSTAR) 300 UNIT/ML SOPN; Inject 60 Units into the skin 2 (two) times daily.  Dispense: 5 pen; Refill: 5 - metFORMIN (GLUCOPHAGE) 500 MG tablet; Take 1 tablet (500 mg total) by mouth 2 (two) times daily with a meal.  Dispense: 60 tablet; Refill: 5 - Ambulatory referral to Ophthalmology  2. Essential hypertension, malignant Do not add salt to diet - CMP14+EGFR - lisinopril (PRINIVIL,ZESTRIL) 20 MG tablet; Take 1 tablet (20 mg total) by mouth daily.  Dispense: 90 tablet; Refill: 5 - furosemide (LASIX) 20 MG tablet; TAKE 1 TABLET (20 MG TOTAL) BY MOUTH DAILY.  Dispense: 30 tablet; Refill: 5  3. Hyperlipidemia, unspecified  hyperlipidemia type Low fat diet - Lipid panel - fenofibrate 160 MG tablet; Take 1 tablet (160 mg total) by mouth daily.  Dispense: 30 tablet; Refill: 5  4. Osteopenia, unspecified location Weight bearing exerises  5. Recurrent major depressive disorder, in full remission ([place]) Stress management - sertraline (ZOLOFT) 100 MG tablet; TAKE 1 & 1/2 TABLET BY MOUTH EVERY DAY  Dispense: 45 tablet; Refill: 5  6. BMI 32.0-32.9,adult Discussed diet and exercise for person with BMI >25 Will recheck weight in 3-6 months  7. GAD (generalized anxiety disorder)  8. Acute pain of both knees Rest Keep appointment with ortho - Ambulatory referral to Orthopedic Surgery    Labs pending Health maintenance reviewed Diet and exercise encouraged Continue all meds Follow up  In 3 months   [place]Cl[place], FNP

## 2015-12-03 LAB — LIPID PANEL
Chol/HDL Ratio: 4.7 ratio units — ABNORMAL HIGH (ref 0.0–4.4)
Cholesterol, Total: 131 mg/dL (ref 100–199)
HDL: 28 mg/dL — AB (ref >39)
LDL Calculated: 57 mg/dL (ref 0–99)
TRIGLYCERIDES: 231 mg/dL — AB (ref 0–149)
VLDL CHOLESTEROL CAL: 46 mg/dL — AB (ref 5–40)

## 2015-12-03 LAB — CMP14+EGFR
ALT: 9 IU/L (ref 0–32)
AST: 16 IU/L (ref 0–40)
Albumin/Globulin Ratio: 1.5 (ref 1.2–2.2)
Albumin: 4.1 g/dL (ref 3.6–4.8)
Alkaline Phosphatase: 64 IU/L (ref 39–117)
BUN/Creatinine Ratio: 17 (ref 12–28)
BUN: 21 mg/dL (ref 8–27)
CALCIUM: 9.1 mg/dL (ref 8.7–10.3)
CHLORIDE: 100 mmol/L (ref 96–106)
CO2: 27 mmol/L (ref 18–29)
Creatinine, Ser: 1.24 mg/dL — ABNORMAL HIGH (ref 0.57–1.00)
GFR calc non Af Amer: 45 mL/min/{1.73_m2} — ABNORMAL LOW (ref >59)
GFR, EST AFRICAN AMERICAN: 52 mL/min/{1.73_m2} — AB (ref >59)
GLUCOSE: 187 mg/dL — AB (ref 65–99)
Globulin, Total: 2.8 g/dL (ref 1.5–4.5)
Potassium: 4.3 mmol/L (ref 3.5–5.2)
Sodium: 140 mmol/L (ref 134–144)
TOTAL PROTEIN: 6.9 g/dL (ref 6.0–8.5)

## 2015-12-04 ENCOUNTER — Telehealth: Payer: Self-pay | Admitting: *Deleted

## 2015-12-04 ENCOUNTER — Other Ambulatory Visit: Payer: Self-pay | Admitting: Nurse Practitioner

## 2015-12-04 LAB — TOXASSURE SELECT 13 (MW), URINE

## 2015-12-04 MED ORDER — IBUPROFEN 800 MG PO TABS: 800.0000 mg | ORAL_TABLET | Freq: Three times a day (TID) | ORAL | 3 refills | Status: DC | PRN

## 2015-12-04 NOTE — Telephone Encounter (Signed)
rx sent to pharmacy

## 2015-12-04 NOTE — Telephone Encounter (Signed)
lmovm that Rx was sent to pharmacy 

## 2015-12-04 NOTE — Telephone Encounter (Signed)
Patient is requesting a refill on Ibuprofen. She said that you were waiting on labs before refilling

## 2015-12-12 DIAGNOSIS — M1711 Unilateral primary osteoarthritis, right knee: Secondary | ICD-10-CM | POA: Diagnosis not present

## 2015-12-16 ENCOUNTER — Telehealth: Payer: Self-pay | Admitting: Family Medicine

## 2015-12-16 NOTE — Telephone Encounter (Signed)
Patient was informed we do not have samples of Toujeo available at this time.  She would like to know if there is anything else she could use instead until we receive samples.  Please advise.

## 2015-12-16 NOTE — Telephone Encounter (Signed)
We do not have any samples of anything else we can give

## 2015-12-22 ENCOUNTER — Telehealth: Payer: Self-pay | Admitting: Nurse Practitioner

## 2015-12-22 NOTE — Telephone Encounter (Signed)
Pt notified no samples available 

## 2015-12-30 ENCOUNTER — Telehealth: Payer: Self-pay | Admitting: Nurse Practitioner

## 2015-12-30 NOTE — Telephone Encounter (Signed)
Pt notified of samples Samples in refrig in lab 

## 2015-12-30 NOTE — Telephone Encounter (Signed)
No samples available 

## 2016-01-03 ENCOUNTER — Other Ambulatory Visit: Payer: Self-pay | Admitting: Pharmacist

## 2016-01-10 ENCOUNTER — Ambulatory Visit (INDEPENDENT_AMBULATORY_CARE_PROVIDER_SITE_OTHER): Payer: Commercial Managed Care - HMO | Admitting: Pediatrics

## 2016-01-10 VITALS — BP 117/58 | HR 89 | Temp 96.9°F | Ht 64.0 in | Wt 197.0 lb

## 2016-01-10 DIAGNOSIS — L03213 Periorbital cellulitis: Secondary | ICD-10-CM | POA: Diagnosis not present

## 2016-01-10 MED ORDER — CLINDAMYCIN HCL 300 MG PO CAPS: 300.0000 mg | ORAL_CAPSULE | Freq: Three times a day (TID) | ORAL | 0 refills | Status: AC

## 2016-01-10 NOTE — Progress Notes (Signed)
  Subjective:   Patient ID: Alexandra Hunt, female    DOB: October 16, 1945, 70 y.o.   MRN: [id number] CC: eye is swelling for 3 days (itching and burning)  HPI: Alexandra Hunt is a 70 y.o. 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 Allergies and medications reviewed and updated. History  Smoking Status  . Never Smoker  Smokeless Tobacco  . Never Used   ROS: Per HPI   Objective:    BP (!) 117/58   Pulse 89   Temp (!) 96.9 F (36.1 C) (Oral)   Ht 5\' 4"  (1.626 m)   Wt 197 lb (89.4 kg)   BMI 33.81 kg/m   Wt Readings from Last 3 Encounters:  01/10/16 197 lb (89.4 kg)  12/02/15 194 lb (88 kg)  11/27/15 197 lb 3.2 oz (89.4 kg)    Gen: NAD, alert, cooperative with exam, NCAT EYES: EOMI, PERRLA, no pain with EOM. no conjunctival injection, or no icterus. Skin below R eye slightly pink, more full than skin below L eye. R eye lid slightly more full than L eye lid. Minimal crusting present eye lashes R eye lid. ENT:  OP without erythema LYMPH: no cervical LAD CV: NRRR, normal S1/S2 Resp: CTABL, no wheezes, normal WOB Ext: No edema, warm Neuro: Alert and oriented Skin: a few small superficial abrasions, 2-7mm around bridge of nose  Assessment & Plan:  Alexandra Hunt was seen  today for eye is swelling for 3 days.  Diagnoses and all orders for this visit:  Preseptal cellulitis of right eye No pain in eye, normal EOM, no conjunctival injection No vision changes Any worsening of eye needs to be seen immediately Treat as below -     clindamycin (CLEOCIN) 300 MG capsule; Take 1 capsule (300 mg total) by mouth 3 (three) times daily.   Follow up plan: Return for when able with Tammy. for DM2 Alexandra Found, MD [place]

## 2016-01-30 ENCOUNTER — Encounter: Payer: Self-pay | Admitting: Internal Medicine

## 2016-03-08 ENCOUNTER — Telehealth: Payer: Self-pay | Admitting: Family Medicine

## 2016-03-09 DIAGNOSIS — Z79899 Other long term (current) drug therapy: Secondary | ICD-10-CM | POA: Diagnosis not present

## 2016-03-09 DIAGNOSIS — Z794 Long term (current) use of insulin: Secondary | ICD-10-CM | POA: Diagnosis not present

## 2016-03-09 DIAGNOSIS — Z7982 Long term (current) use of aspirin: Secondary | ICD-10-CM | POA: Diagnosis not present

## 2016-03-09 DIAGNOSIS — E119 Type 2 diabetes mellitus without complications: Secondary | ICD-10-CM | POA: Diagnosis not present

## 2016-03-09 DIAGNOSIS — R51 Headache: Secondary | ICD-10-CM | POA: Diagnosis not present

## 2016-03-09 DIAGNOSIS — I10 Essential (primary) hypertension: Secondary | ICD-10-CM | POA: Diagnosis not present

## 2016-03-09 DIAGNOSIS — R42 Dizziness and giddiness: Secondary | ICD-10-CM | POA: Diagnosis not present

## 2016-03-10 ENCOUNTER — Ambulatory Visit (INDEPENDENT_AMBULATORY_CARE_PROVIDER_SITE_OTHER): Payer: Commercial Managed Care - HMO | Admitting: Ophthalmology

## 2016-03-10 ENCOUNTER — Telehealth: Payer: Self-pay | Admitting: Family Medicine

## 2016-03-14 ENCOUNTER — Other Ambulatory Visit: Payer: Self-pay | Admitting: Nurse Practitioner

## 2016-03-14 DIAGNOSIS — F411 Generalized anxiety disorder: Secondary | ICD-10-CM

## 2016-03-16 NOTE — Telephone Encounter (Signed)
Refill called to [facility] 

## 2016-03-16 NOTE — Telephone Encounter (Signed)
Last filled 01/23/16. Last seen 12/02/15. Call in

## 2016-03-16 NOTE — Telephone Encounter (Signed)
Please call in ativan with 1 refills 

## 2016-03-19 ENCOUNTER — Other Ambulatory Visit: Payer: Self-pay | Admitting: *Deleted

## 2016-03-19 ENCOUNTER — Telehealth: Payer: Self-pay | Admitting: Nurse Practitioner

## 2016-03-19 DIAGNOSIS — IMO0002 Reserved for concepts with insufficient information to code with codable children: Secondary | ICD-10-CM

## 2016-03-19 DIAGNOSIS — E108 Type 1 diabetes mellitus with unspecified complications: Principal | ICD-10-CM

## 2016-03-19 DIAGNOSIS — E1065 Type 1 diabetes mellitus with hyperglycemia: Secondary | ICD-10-CM

## 2016-03-19 MED ORDER — INSULIN REGULAR HUMAN 100 UNIT/ML IJ SOLN: INTRAMUSCULAR | 5 refills | Status: DC

## 2016-03-19 NOTE — Telephone Encounter (Signed)
Pt notified of RX 

## 2016-03-19 NOTE — Progress Notes (Signed)
rx sent in per pt request [facility] per MMM

## 2016-03-22 ENCOUNTER — Other Ambulatory Visit: Payer: Self-pay | Admitting: Family Medicine

## 2016-03-22 ENCOUNTER — Other Ambulatory Visit: Payer: Self-pay | Admitting: Nurse Practitioner

## 2016-03-23 NOTE — Telephone Encounter (Signed)
Messages are confusing - NA and No VM _ need to speak with the pt

## 2016-03-26 ENCOUNTER — Ambulatory Visit (INDEPENDENT_AMBULATORY_CARE_PROVIDER_SITE_OTHER): Payer: Commercial Managed Care - HMO | Admitting: Nurse Practitioner

## 2016-03-26 ENCOUNTER — Encounter: Payer: Self-pay | Admitting: Nurse Practitioner

## 2016-03-26 VITALS — BP 133/57 | HR 94 | Temp 97.8°F | Ht 64.0 in | Wt 198.0 lb

## 2016-03-26 DIAGNOSIS — F3342 Major depressive disorder, recurrent, in full remission: Secondary | ICD-10-CM | POA: Diagnosis not present

## 2016-03-26 DIAGNOSIS — E785 Hyperlipidemia, unspecified: Secondary | ICD-10-CM | POA: Diagnosis not present

## 2016-03-26 DIAGNOSIS — E1142 Type 2 diabetes mellitus with diabetic polyneuropathy: Secondary | ICD-10-CM

## 2016-03-26 DIAGNOSIS — Z794 Long term (current) use of insulin: Secondary | ICD-10-CM

## 2016-03-26 DIAGNOSIS — Z6832 Body mass index (BMI) 32.0-32.9, adult: Secondary | ICD-10-CM

## 2016-03-26 DIAGNOSIS — F411 Generalized anxiety disorder: Secondary | ICD-10-CM | POA: Diagnosis not present

## 2016-03-26 DIAGNOSIS — I10 Essential (primary) hypertension: Secondary | ICD-10-CM | POA: Diagnosis not present

## 2016-03-26 DIAGNOSIS — L989 Disorder of the skin and subcutaneous tissue, unspecified: Secondary | ICD-10-CM | POA: Diagnosis not present

## 2016-03-26 LAB — BAYER DCA HB A1C WAIVED: HB A1C (BAYER DCA - WAIVED): 8.3 % — ABNORMAL HIGH (ref <7.0)

## 2016-03-26 MED ORDER — LISINOPRIL 20 MG PO TABS: 20.0000 mg | ORAL_TABLET | Freq: Every day | ORAL | 5 refills | Status: DC

## 2016-03-26 MED ORDER — MUPIROCIN CALCIUM 2 % EX CREA: 1.0000 "application " | TOPICAL_CREAM | Freq: Two times a day (BID) | CUTANEOUS | 0 refills | Status: DC

## 2016-03-26 MED ORDER — METFORMIN HCL 500 MG PO TABS: 500.0000 mg | ORAL_TABLET | Freq: Two times a day (BID) | ORAL | 5 refills | Status: DC

## 2016-03-26 MED ORDER — SERTRALINE HCL 100 MG PO TABS: ORAL_TABLET | ORAL | 5 refills | Status: DC

## 2016-03-26 MED ORDER — INSULIN GLARGINE 300 UNIT/ML ~~LOC~~ SOPN: 60.0000 [IU] | PEN_INJECTOR | Freq: Two times a day (BID) | SUBCUTANEOUS | 5 refills | Status: DC

## 2016-03-26 MED ORDER — FENOFIBRATE 160 MG PO TABS: 160.0000 mg | ORAL_TABLET | Freq: Every day | ORAL | 5 refills | Status: DC

## 2016-03-26 MED ORDER — INSULIN REGULAR HUMAN 100 UNIT/ML IJ SOLN: INTRAMUSCULAR | 5 refills | Status: DC

## 2016-03-26 MED ORDER — FUROSEMIDE 20 MG PO TABS: ORAL_TABLET | ORAL | 5 refills | Status: DC

## 2016-03-26 NOTE — Patient Instructions (Signed)
Carbohydrate Counting for Diabetes Mellitus, Adult Carbohydrate counting is a method for keeping track of how many carbohydrates you eat. Eating carbohydrates naturally increases the amount of sugar (glucose) in the blood. Counting how many carbohydrates you eat helps keep your blood glucose within normal limits, which helps you manage your diabetes (diabetes mellitus). It is important to know how many carbohydrates you can safely have in each meal. This is different for every person. A diet and nutrition specialist (registered dietitian) can help you make a meal plan and calculate how many carbohydrates you should have at each meal and snack. Carbohydrates are found in the following foods:  Grains, such as breads and cereals.  Dried beans and soy products.  Starchy vegetables, such as potatoes, peas, and corn.  Fruit and fruit juices.  Milk and yogurt.  Sweets and snack foods, such as cake, cookies, candy, chips, and soft drinks. How do I count carbohydrates? There are two ways to count carbohydrates in food. You can use either of the methods or a combination of both. Reading "Nutrition Facts" on packaged food  The "Nutrition Facts" list is included on the labels of almost all packaged foods and beverages in the [place] It includes:  The serving size.  Information about nutrients in each serving, including the grams (g) of carbohydrate per serving. To use the "Nutrition Facts":  Decide how many servings you will have.  Multiply the number of servings by the number of carbohydrates per serving.  The resulting number is the total amount of carbohydrates that you will be having. Learning standard serving sizes of other foods  When you eat foods containing carbohydrates that are not packaged or do not include "Nutrition Facts" on the label, you need to measure the servings in order to count the amount of carbohydrates:  Measure the foods that you will eat with a food scale or measuring  cup, if needed.  Decide how many standard-size servings you will eat.  Multiply the number of servings by 15. Most carbohydrate-rich foods have about 15 g of carbohydrates per serving.  For example, if you eat 8 oz (170 g) of strawberries, you will have eaten 2 servings and 30 g of carbohydrates (2 servings x 15 g = 30 g).  For foods that have more than one food mixed, such as soups and casseroles, you must count the carbohydrates in each food that is included. The following list contains standard serving sizes of common carbohydrate-rich foods. Each of these servings has about 15 g of carbohydrates:   hamburger bun or  English muffin.   oz (15 mL) syrup.   oz (14 g) jelly.  1 slice of bread.  1 six-inch tortilla.  3 oz (85 g) cooked rice or pasta.  4 oz (113 g) cooked dried beans.  4 oz (113 g) starchy vegetable, such as peas, corn, or potatoes.  4 oz (113 g) hot cereal.  4 oz (113 g) mashed potatoes or  of a large baked potato.  4 oz (113 g) canned or frozen fruit.  4 oz (120 mL) fruit juice.  4-6 crackers.  6 chicken nuggets.  6 oz (170 g) unsweetened dry cereal.  6 oz (170 g) plain fat-free yogurt or yogurt sweetened with artificial sweeteners.  8 oz (240 mL) milk.  8 oz (170 g) fresh fruit or one small piece of fruit.  24 oz (680 g) popped popcorn. Example of carbohydrate counting Sample meal  3 oz (85 g) chicken breast.  6 oz (  170 g) brown rice.  4 oz (113 g) corn.  8 oz (240 mL) milk.  8 oz (170 g) strawberries with sugar-free whipped topping. Carbohydrate calculation 1. Identify the foods that contain carbohydrates:  Rice.  Corn.  Milk.  Strawberries. 2. Calculate how many servings you have of each food:  2 servings rice.  1 serving corn.  1 serving milk.  1 serving strawberries. 3. Multiply each number of servings by 15 g:  2 servings rice x 15 g = 30 g.  1 serving corn x 15 g = 15 g.  1 serving milk x 15 g = 15  g.  1 serving strawberries x 15 g = 15 g. 4. Add together all of the amounts to find the total grams of carbohydrates eaten:  30 g + 15 g + 15 g + 15 g = 75 g of carbohydrates total. This information is not intended to replace advice given to you by your health care provider. Make sure you discuss any questions you have with your health care provider. Document Released: 02/15/2005 Document Revised: 09/05/2015 Document Reviewed: 07/30/2015 Elsevier Interactive Patient Education  2017 [employer]  

## 2016-03-26 NOTE — Addendum Note (Signed)
Addended by: Chevis Pretty on: 03/26/2016 12:52 PM   Modules accepted: Orders

## 2016-03-26 NOTE — Progress Notes (Signed)
Subjective:    Patient ID: Alexandra Hunt, female    DOB: [birth year], 71 y.o.   MRN: [id number]  Patient here today for follow up of chronic medical problems.  Outpatient Encounter Prescriptions as of 03/26/2016  Medication Sig  . aspirin 81 MG tablet Take 81 mg by mouth daily.  . B-D INS SYR ULTRAFINE 1CC/31G 31G X 5/16" 1 ML MISC USE DAILY WITH LANTUS AND HUMALOG INSULIN SLIDING SCALES AS INSTRUCTED  . B-D UF III MINI PEN NEEDLES 31G X 5 MM MISC USE WITH SLIDING SCALE HUMALOG 4 TIMES DAILY  . cephALEXin (KEFLEX) 500 MG capsule   . diclofenac (VOLTAREN) 75 MG EC tablet   . fenofibrate 160 MG tablet Take 1 tablet (160 mg total) by mouth daily.  . furosemide (LASIX) 20 MG tablet TAKE 1 TABLET (20 MG TOTAL) BY MOUTH DAILY.  Marland Kitchen ibuprofen (ADVIL,MOTRIN) 800 MG tablet Take 1 tablet (800 mg total) by mouth every 8 (eight) hours as needed.  . Insulin Glargine (TOUJEO SOLOSTAR) 300 UNIT/ML SOPN Inject 60 Units into the skin 2 (two) times daily.  . Insulin Pen Needle (BD PEN NEEDLE NANO U/F) 32G X 4 MM MISC Use up to 6 times daily with toujeo and humalog.  . insulin regular (HUMULIN R) 250 units/2.37m (100 units/mL) injection Sliding scale: less than 80 (6u): 80-120 (11u): 121-150 (12u): 151-200 (13u): 210-250 (14u): 251-300 (16u): over 300 (18u)  . insulin regular (NOVOLIN R,HUMULIN R) 100 units/mL injection Inject into the skin. Sliding scale  . lisinopril (PRINIVIL,ZESTRIL) 20 MG tablet Take 1 tablet (20 mg total) by mouth daily.  .Marland KitchenLORazepam (ATIVAN) 1 MG tablet TAKE 1 TABLET BY MOUTH TWICE A DAY AS NEEDED  . metFORMIN (GLUCOPHAGE) 500 MG tablet Take 1 tablet (500 mg total) by mouth 2 (two) times daily with a meal.  . Multiple Vitamins-Minerals (THRIVE FOR LIFE WOMENS PO) Take by mouth daily.  . ondansetron (ZOFRAN) 4 MG tablet   . ONE TOUCH ULTRA TEST test strip USE TO TEST BLOOD SUGAR AT LEAST FOUR TIMES A DAY (ON SLIDING SCALE)  . pseudoephedrine (SUDAFED) 30 MG tablet Take 30 mg by mouth 2  (two) times daily. Reported on 04/01/2015  . sertraline (ZOLOFT) 100 MG tablet TAKE 1 & 1/2 TABLET BY MOUTH EVERY DAY  . Vitamin D, Ergocalciferol, (DRISDOL) 50000 units CAPS capsule TAKE 1 CAPSULE (50,000 UNITS TOTAL) BY MOUTH EVERY 7 (SEVEN) DAYS.   No facility-administered encounter medications on file as of 03/26/2016.    * Patient saw Dr. BWendi Snipeslast week requesting her ativan and an increase in herpain meds- he would not give her both so she choose pain meds- decided that she needed her ativan more so she brought rx back and ask for ativan refill. SHe was told of new pain policy and she understands.  Diabetes  She presents for her follow-up diabetic visit. She has type 2 diabetes mellitus. No MedicAlert identification noted. The initial diagnosis of diabetes was made 15 years ago. Pertinent negatives for diabetes include no blurred vision. There are no hypoglycemic complications. Symptoms are stable. Diabetic complications include nephropathy and retinopathy. Risk factors for coronary artery disease include dyslipidemia, hypertension, obesity and post-menopausal. Current diabetic treatment includes oral agent (monotherapy) and insulin injections (patient ran out of toujeo and has been using her husbands lantus- started back on toujeo first of year.). She is compliant with treatment some of the time. She is currently taking insulin at bedtime (lantus- and does sliding scale with meals). Insulin injections  are given by patient. Rotation sites for injection include the abdominal wall. Her weight is stable. Diabetic current diet: patient is very noncompliant with her diet . When asked about meal planning, she reported none (patinet is not compliant with diet.). She has not had a previous visit with a dietitian. She rarely participates in exercise. Her home blood glucose trend is fluctuating minimally. Her breakfast blood glucose is taken between 7-8 am. Her breakfast blood glucose range is generally  130-140 mg/dl. Her lunch blood glucose is taken between 1-2 pm. Her lunch blood glucose range is generally 180-200 mg/dl. Her dinner blood glucose is taken between 7-8 pm. Her dinner blood glucose range is generally >200 mg/dl. Her highest blood glucose is >200 mg/dl. Her overall blood glucose range is 140-180 mg/dl. An ACE inhibitor/angiotensin II receptor blocker is being taken. She does not see a podiatrist.Eye exam is not current.  Hypertension  This is a chronic problem. The current episode started more than 1 year ago. The problem has been resolved since onset. The problem is controlled. Pertinent negatives include no blurred vision, orthopnea or peripheral edema. There are no associated agents to hypertension. Risk factors for coronary artery disease include diabetes mellitus, dyslipidemia, post-menopausal state and sedentary lifestyle. Past treatments include diuretics. The current treatment provides mild improvement. Compliance problems include diet.  Hypertensive end-organ damage includes kidney disease, CAD/MI and retinopathy.  Hyperlipidemia  This is a chronic problem. The current episode started more than 1 year ago. The problem is controlled. Recent lipid tests were reviewed and are normal. Exacerbating diseases include diabetes. She has no history of obesity. Current antihyperlipidemic treatment includes statins. The current treatment provides moderate improvement of lipids.  Depression/GAD Currently taking Zoloft 128m qd and Ativan 118mbid.  Still having issues of stress and anxiety at times.  She is under a lot of stress and does not take care of herself at all.   Review of Systems  Constitutional: Negative.   Eyes: Negative for blurred vision.  Respiratory: Negative.   Cardiovascular: Negative.  Negative for orthopnea.  Skin: Negative.   Neurological: Negative.   Psychiatric/Behavioral: Negative.   All other systems reviewed and are negative.      Objective:   Physical Exam    Constitutional: She is oriented to person, place, and time. She appears well-developed and well-nourished.  HENT:  Nose: Nose normal.  Mouth/Throat: Oropharynx is clear and moist.  Eyes: EOM are normal.  Neck: Trachea normal, normal range of motion and full passive range of motion without pain. Neck supple. No JVD present. Carotid bruit is not present. No thyromegaly present.  Cardiovascular: Normal rate, regular rhythm, normal heart sounds and intact distal pulses.  Exam reveals no gallop and no friction rub.   No murmur heard. Pulmonary/Chest: Effort normal and breath sounds normal.  Abdominal: Soft. Bowel sounds are normal. She exhibits no distension and no mass. There is no tenderness.  Musculoskeletal: Normal range of motion.       Arms: Lymphadenopathy:    She has no cervical adenopathy.  Neurological: She is alert and oriented to person, place, and time. She has normal reflexes.  Skin: Skin is warm and dry.  3cm oval shaped flat lesion anterior left chest.  Psychiatric: She has a normal mood and affect. Her behavior is normal. Judgment and thought content normal.    BP (!) 133/57   Pulse 94   Temp 97.8 F (36.6 C) (Oral)   Ht 5' 4" (1.626 m)   Wt 198  lb (89.8 kg)   BMI 33.99 kg/m   Hgba1c 8.3%     Assessment & Plan:  1. Type 2 diabetes mellitus with diabetic polyneuropathy, with long-term current use of insulin (HCC) Stricter carb counting - Bayer DCA Hb A1c Waived - Insulin Glargine (TOUJEO SOLOSTAR) 300 UNIT/ML SOPN; Inject 60 Units into the skin 2 (two) times daily.  Dispense: 5 pen; Refill: 5 - metFORMIN (GLUCOPHAGE) 500 MG tablet; Take 1 tablet (500 mg total) by mouth 2 (two) times daily with a meal.  Dispense: 60 tablet; Refill: 5 - insulin regular (NOVOLIN R,HUMULIN R) 250 units/2.64m (100 units/mL) injection; Up to 20u 4x a day Sliding scale  Dispense: 10 mL; Refill: 5  2. Essential hypertension, malignant Low sodium diet - CMP14+EGFR - lisinopril  (PRINIVIL,ZESTRIL) 20 MG tablet; Take 1 tablet (20 mg total) by mouth daily.  Dispense: 90 tablet; Refill: 5 - furosemide (LASIX) 20 MG tablet; TAKE 1 TABLET (20 MG TOTAL) BY MOUTH DAILY.  Dispense: 30 tablet; Refill: 5  3. Hyperlipidemia, unspecified hyperlipidemia type Low fat diet - Lipid panel - fenofibrate 160 MG tablet; Take 1 tablet (160 mg total) by mouth daily.  Dispense: 30 tablet; Refill: 5  4. Recurrent major depressive disorder, in full remission ([place] Stress management - sertraline (ZOLOFT) 100 MG tablet; TAKE 1 & 1/2 TABLET BY MOUTH EVERY DAY  Dispense: 45 tablet; Refill: 5  5. GAD (generalized anxiety disorder)  6. BMI 32.0-32.9,adult Discussed diet and exercise for person with BMI >25 Will recheck weight in 3-6 months  7. Skin lesion of chest wall Will wait on culture to come back Continue keflex as rx    Labs pending Health maintenance reviewed Diet and exercise encouraged Continue all meds Follow up  In 3 months   [place] FNP

## 2016-03-27 LAB — LIPID PANEL
CHOL/HDL RATIO: 4.6 ratio — AB (ref 0.0–4.4)
CHOLESTEROL TOTAL: 128 mg/dL (ref 100–199)
HDL: 28 mg/dL — ABNORMAL LOW (ref >39)
LDL CALC: 62 mg/dL (ref 0–99)
Triglycerides: 188 mg/dL — ABNORMAL HIGH (ref 0–149)
VLDL CHOLESTEROL CAL: 38 mg/dL (ref 5–40)

## 2016-03-27 LAB — CMP14+EGFR
ALBUMIN: 4 g/dL (ref 3.6–4.8)
ALK PHOS: 50 IU/L (ref 39–117)
ALT: 10 IU/L (ref 0–32)
AST: 9 IU/L (ref 0–40)
Albumin/Globulin Ratio: 1.4 (ref 1.2–2.2)
BUN / CREAT RATIO: 23 (ref 12–28)
BUN: 23 mg/dL (ref 8–27)
Bilirubin Total: <0.2 mg/dL (ref 0.0–1.2)
CO2: 24 mmol/L (ref 18–29)
CREATININE: 1.02 mg/dL — AB (ref 0.57–1.00)
Calcium: 9.2 mg/dL (ref 8.7–10.3)
Chloride: 102 mmol/L (ref 96–106)
GFR, EST AFRICAN AMERICAN: 65 mL/min/{1.73_m2} (ref >59)
GFR, EST NON AFRICAN AMERICAN: 57 mL/min/{1.73_m2} — AB (ref >59)
GLOBULIN, TOTAL: 2.9 g/dL (ref 1.5–4.5)
Glucose: 121 mg/dL — ABNORMAL HIGH (ref 65–99)
Potassium: 4.3 mmol/L (ref 3.5–5.2)
SODIUM: 144 mmol/L (ref 134–144)
TOTAL PROTEIN: 6.9 g/dL (ref 6.0–8.5)

## 2016-03-30 LAB — ANAEROBIC AND AEROBIC CULTURE

## 2016-03-31 ENCOUNTER — Other Ambulatory Visit: Payer: Self-pay | Admitting: Nurse Practitioner

## 2016-04-02 NOTE — Telephone Encounter (Signed)
Patient has been seen since phone call.  This encounter will now be closed 

## 2016-04-16 ENCOUNTER — Telehealth: Payer: Self-pay | Admitting: Family Medicine

## 2016-04-18 ENCOUNTER — Other Ambulatory Visit: Payer: Self-pay | Admitting: Nurse Practitioner

## 2016-04-18 DIAGNOSIS — E1142 Type 2 diabetes mellitus with diabetic polyneuropathy: Secondary | ICD-10-CM

## 2016-04-18 DIAGNOSIS — Z794 Long term (current) use of insulin: Principal | ICD-10-CM

## 2016-04-19 NOTE — Telephone Encounter (Signed)
Gave to [place],

## 2016-04-21 ENCOUNTER — Other Ambulatory Visit: Payer: Self-pay | Admitting: Family Medicine

## 2016-04-22 ENCOUNTER — Other Ambulatory Visit: Payer: Self-pay | Admitting: Nurse Practitioner

## 2016-04-23 NOTE — Telephone Encounter (Signed)
NA mailbox full, calling to find out exactly what patient needs jkp 2/23

## 2016-05-06 DIAGNOSIS — H40023 Open angle with borderline findings, high risk, bilateral: Secondary | ICD-10-CM | POA: Diagnosis not present

## 2016-05-06 DIAGNOSIS — H2513 Age-related nuclear cataract, bilateral: Secondary | ICD-10-CM | POA: Diagnosis not present

## 2016-05-06 DIAGNOSIS — H25013 Cortical age-related cataract, bilateral: Secondary | ICD-10-CM | POA: Diagnosis not present

## 2016-05-06 DIAGNOSIS — H40033 Anatomical narrow angle, bilateral: Secondary | ICD-10-CM | POA: Diagnosis not present

## 2016-05-10 ENCOUNTER — Telehealth: Payer: Self-pay | Admitting: Nurse Practitioner

## 2016-05-12 ENCOUNTER — Other Ambulatory Visit: Payer: Self-pay

## 2016-05-12 MED ORDER — ALCOHOL PREP PADS: MEDICATED_PAD | 1 refills | Status: DC

## 2016-05-12 MED ORDER — TRUEPLUS LANCETS 33G MISC: 3 refills | Status: DC

## 2016-05-12 MED ORDER — GLUCOSE BLOOD VI STRP: ORAL_STRIP | 12 refills | Status: DC

## 2016-05-12 NOTE — Telephone Encounter (Signed)
Have attempted to contact patient several times on home phone and cell phone. Unable to leave a message at either number due to voice mail being full on both. Supplies sent in per message left back in February to [facility] mail order

## 2016-05-21 ENCOUNTER — Other Ambulatory Visit: Payer: Self-pay | Admitting: Nurse Practitioner

## 2016-05-21 DIAGNOSIS — F411 Generalized anxiety disorder: Secondary | ICD-10-CM

## 2016-05-21 NOTE — Telephone Encounter (Signed)
Last filled 04/19/16, last seen 03/26/16. Needs call in

## 2016-05-24 NOTE — Telephone Encounter (Signed)
Please call in ativan with 1 refills 

## 2016-05-24 NOTE — Telephone Encounter (Signed)
[facility]

## 2016-05-25 DIAGNOSIS — E113293 Type 2 diabetes mellitus with mild nonproliferative diabetic retinopathy without macular edema, bilateral: Secondary | ICD-10-CM | POA: Diagnosis not present

## 2016-05-25 DIAGNOSIS — H2513 Age-related nuclear cataract, bilateral: Secondary | ICD-10-CM | POA: Diagnosis not present

## 2016-05-25 DIAGNOSIS — H35372 Puckering of macula, left eye: Secondary | ICD-10-CM | POA: Diagnosis not present

## 2016-05-25 DIAGNOSIS — H35342 Macular cyst, hole, or pseudohole, left eye: Secondary | ICD-10-CM | POA: Diagnosis not present

## 2016-05-25 LAB — HM DIABETES EYE EXAM

## 2016-05-31 ENCOUNTER — Other Ambulatory Visit: Payer: Self-pay | Admitting: *Deleted

## 2016-05-31 MED ORDER — GLUCOSE BLOOD VI STRP: ORAL_STRIP | 12 refills | Status: DC

## 2016-06-24 ENCOUNTER — Telehealth: Payer: Self-pay

## 2016-06-24 NOTE — Telephone Encounter (Signed)
Go ahead and change the prescription for her

## 2016-06-29 ENCOUNTER — Other Ambulatory Visit: Payer: Self-pay | Admitting: Family Medicine

## 2016-07-06 ENCOUNTER — Telehealth: Payer: Self-pay | Admitting: Nurse Practitioner

## 2016-07-07 NOTE — Telephone Encounter (Signed)
Called pt, I called health dept. They said it had already been sent to manufacture, we signed already

## 2016-07-14 ENCOUNTER — Telehealth: Payer: Self-pay | Admitting: Family Medicine

## 2016-07-14 NOTE — Telephone Encounter (Signed)
Pt notified of samples appt scheduled

## 2016-07-16 NOTE — Telephone Encounter (Signed)
Phone call taken care of in different encounter.  This encounter will now be closed  

## 2016-07-19 ENCOUNTER — Telehealth: Payer: Self-pay | Admitting: Family Medicine

## 2016-07-22 ENCOUNTER — Ambulatory Visit: Payer: Commercial Managed Care - HMO | Admitting: Nurse Practitioner

## 2016-07-22 ENCOUNTER — Telehealth: Payer: Self-pay | Admitting: Family Medicine

## 2016-07-22 NOTE — Telephone Encounter (Signed)
I have not filled out any paperwork for patient assistance this patient.  Was it sent to [place]?? Will forward to [place] to see if one of them knows about this.

## 2016-07-22 NOTE — Telephone Encounter (Signed)
Pt received letter from [facility] that they sent her insulin to our office.  I did not locate any Toujeo insulin in our refrigerator.  Pt will contact [employer] as well.

## 2016-07-26 ENCOUNTER — Other Ambulatory Visit: Payer: Self-pay | Admitting: Nurse Practitioner

## 2016-07-26 DIAGNOSIS — F411 Generalized anxiety disorder: Secondary | ICD-10-CM

## 2016-07-28 NOTE — Telephone Encounter (Signed)
Forward to PCP/MMM 

## 2016-07-29 ENCOUNTER — Ambulatory Visit (INDEPENDENT_AMBULATORY_CARE_PROVIDER_SITE_OTHER): Payer: Medicare HMO | Admitting: Nurse Practitioner

## 2016-07-29 ENCOUNTER — Encounter: Payer: Self-pay | Admitting: Nurse Practitioner

## 2016-07-29 VITALS — BP 116/68 | HR 88 | Temp 98.1°F | Ht 64.0 in | Wt 201.0 lb

## 2016-07-29 DIAGNOSIS — R7989 Other specified abnormal findings of blood chemistry: Secondary | ICD-10-CM | POA: Diagnosis not present

## 2016-07-29 DIAGNOSIS — F3342 Major depressive disorder, recurrent, in full remission: Secondary | ICD-10-CM | POA: Diagnosis not present

## 2016-07-29 DIAGNOSIS — Z6832 Body mass index (BMI) 32.0-32.9, adult: Secondary | ICD-10-CM

## 2016-07-29 DIAGNOSIS — E1142 Type 2 diabetes mellitus with diabetic polyneuropathy: Secondary | ICD-10-CM | POA: Diagnosis not present

## 2016-07-29 DIAGNOSIS — I10 Essential (primary) hypertension: Secondary | ICD-10-CM | POA: Diagnosis not present

## 2016-07-29 DIAGNOSIS — F411 Generalized anxiety disorder: Secondary | ICD-10-CM | POA: Diagnosis not present

## 2016-07-29 DIAGNOSIS — M8580 Other specified disorders of bone density and structure, unspecified site: Secondary | ICD-10-CM | POA: Diagnosis not present

## 2016-07-29 DIAGNOSIS — E785 Hyperlipidemia, unspecified: Secondary | ICD-10-CM | POA: Diagnosis not present

## 2016-07-29 DIAGNOSIS — Z794 Long term (current) use of insulin: Secondary | ICD-10-CM

## 2016-07-29 LAB — BAYER DCA HB A1C WAIVED: HB A1C: 8.3 % — AB (ref <7.0)

## 2016-07-29 MED ORDER — SERTRALINE HCL 100 MG PO TABS: ORAL_TABLET | ORAL | 5 refills | Status: DC

## 2016-07-29 MED ORDER — LORAZEPAM 1 MG PO TABS: 1.0000 mg | ORAL_TABLET | Freq: Two times a day (BID) | ORAL | 2 refills | Status: DC | PRN

## 2016-07-29 MED ORDER — FENOFIBRATE 160 MG PO TABS: 160.0000 mg | ORAL_TABLET | Freq: Every day | ORAL | 5 refills | Status: DC

## 2016-07-29 MED ORDER — METFORMIN HCL 500 MG PO TABS: 500.0000 mg | ORAL_TABLET | Freq: Two times a day (BID) | ORAL | 5 refills | Status: DC

## 2016-07-29 MED ORDER — FUROSEMIDE 20 MG PO TABS: ORAL_TABLET | ORAL | 5 refills | Status: DC

## 2016-07-29 NOTE — Telephone Encounter (Signed)
Please call in diazepam with 0 refills\ Last refill without being seen

## 2016-07-29 NOTE — Progress Notes (Signed)
Subjective:    Patient ID: Alexandra Hunt, female    DOB: August 01, 1945, 71 y.o.   MRN: [id number]  HPI [name] is here today for follow up of chronic medical problem.  Outpatient Encounter Prescriptions as of 07/29/2016  Medication Sig  . Alcohol Swabs (ALCOHOL PREP) PADS Use up to QID while checking BS  . aspirin 81 MG tablet Take 81 mg by mouth daily.  . B-D INS SYR ULTRAFINE 1CC/31G 31G X 5/16" 1 ML MISC USE DAILY WITH LANTUS AND HUMALOG INSULIN SLIDING SCALES AS INSTRUCTED  . B-D UF III MINI PEN NEEDLES 31G X 5 MM MISC USE WITH SLIDING SCALE HUMALOG 4 TIMES DAILY  . diclofenac (VOLTAREN) 75 MG EC tablet   . fenofibrate 160 MG tablet Take 1 tablet (160 mg total) by mouth daily.  . furosemide (LASIX) 20 MG tablet TAKE 1 TABLET (20 MG TOTAL) BY MOUTH DAILY.  Marland Kitchen glucose blood (TRUE METRIX BLOOD GLUCOSE TEST) test strip Use as instructed  . ibuprofen (ADVIL,MOTRIN) 800 MG tablet TAKE 1 TABLET (800 MG TOTAL) BY MOUTH EVERY 8 (EIGHT) HOURS AS NEEDED.  Marland Kitchen Insulin Glargine (TOUJEO SOLOSTAR) 300 UNIT/ML SOPN Inject 60 Units into the skin 2 (two) times daily.  . Insulin Pen Needle (BD PEN NEEDLE NANO U/F) 32G X 4 MM MISC Use up to 6 times daily with toujeo and humalog.  . insulin regular (HUMULIN R) 250 units/2.77m (100 units/mL) injection Sliding scale: less than 80 (6u): 80-120 (11u): 121-150 (12u): 151-200 (13u): 210-250 (14u): 251-300 (16u): over 300 (18u)  . insulin regular (NOVOLIN R,HUMULIN R) 250 units/2.519m(100 units/mL) injection Up to 20u 4x a day Sliding scale  . lisinopril (PRINIVIL,ZESTRIL) 20 MG tablet Take 1 tablet (20 mg total) by mouth daily.  . Marland KitchenORazepam (ATIVAN) 1 MG tablet TAKE ONE TABLET BY MOUTH TWICE DAILY AS NEEDED  . metFORMIN (GLUCOPHAGE) 500 MG tablet Take 1 tablet (500 mg total) by mouth 2 (two) times daily with a meal.  . Multiple Vitamins-Minerals (THRIVE FOR LIFE WOMENS PO) Take by mouth daily.  . mupirocin cream (BACTROBAN) 2 % Apply 1 application topically 2  (two) times daily.  . ondansetron (ZOFRAN) 4 MG tablet   . ONE TOUCH ULTRA TEST test strip USE TO TEST BLOOD SUGAR AT LEAST FOUR TIMES A DAY (ON SLIDING SCALE)  . pseudoephedrine (SUDAFED) 30 MG tablet Take 30 mg by mouth 2 (two) times daily. Reported on 04/01/2015  . sertraline (ZOLOFT) 100 MG tablet TAKE 1 & 1/2 TABLET BY MOUTH EVERY DAY  . TOUJEO SOLOSTAR 300 UNIT/ML SOPN INJECT 60 UNITS INTO THE SKIN 2 (TWO) TIMES DAILY.  . Marland KitchenRUEPLUS LANCETS 33G MISC Check blood sugar up to 4 times daily  . Vitamin D, Ergocalciferol, (DRISDOL) 50000 units CAPS capsule TAKE 1 CAPSULE (50,000 UNITS TOTAL) BY MOUTH EVERY 7 (SEVEN) DAYS.  . [DISCONTINUED] cephALEXin (KEFLEX) 500 MG capsule    No facility-administered encounter medications on file as of 07/29/2016.     1. Essential hypertension, malignant  Patient takes lisinopril and checks her blood pressure daily with average being 118/52.  2. Type 2 diabetes mellitus with diabetic polyneuropathy, with long-term current use of insulin ([place] Patient uses regular insulin, Toujeo, and metformin for management.  Patient's last A1C was 8.3% on 03/26/16.  Patient checks blood glucose five times daily.   Blood glucose ranges from 70s-200s.  Patient states several checks this week were greater than 300. She has been rationing her insulin to make it last  longer- has recently got some free samples and has been taking as directed for the last 2 days,  3. Osteopenia, unspecified location  Patient take women's multivitamin for supplementation.  Patient states she is unable to walk for exercise lately due to knee pain.  4. Hyperlipidemia, unspecified hyperlipidemia type  Managed with fenofibrate and low fat diet.  5. Recurrent major depressive disorder, in full remission [facility])  Patient manages with stress management.  6. GAD (generalized anxiety disorder)  Patient has Ativan for anxiety as needed.  7. BMI 32.0-32.9,adult  No recent weight gain or loss.  8. Low serum  vitamin D  Patient supplements with 50000 unit vitamin D capsules.    New complaints: None today.    Review of Systems  Constitutional: Negative for activity change, appetite change and fatigue.  Respiratory: Negative for cough and shortness of breath.   Cardiovascular: Negative for chest pain and palpitations.  Gastrointestinal: Negative for abdominal distention and abdominal pain.  Genitourinary:       Patient states she is having frequent urine leakage and nocturia.  Musculoskeletal: Negative for neck pain and neck stiffness.  Neurological: Positive for headaches (intermittent sharp, throbbing pain in the head for last several weeks, lasting only minutes). Negative for dizziness.  All other systems reviewed and are negative.      Objective:   Physical Exam  Constitutional: She is oriented to person, place, and time. She appears well-developed and well-nourished. No distress.  HENT:  Head: Normocephalic and atraumatic.  Right Ear: External ear normal.  Left Ear: External ear normal.  Nose: Nose normal.  Mouth/Throat: Oropharynx is clear and moist.  Eyes: Pupils are equal, round, and reactive to light.  Neck: Normal range of motion. Neck supple. No JVD present. No thyromegaly present.  Cardiovascular: Normal rate, regular rhythm, normal heart sounds and intact distal pulses.   No murmur heard. Pulmonary/Chest: Effort normal and breath sounds normal. No respiratory distress.  Abdominal: Soft. Bowel sounds are normal. She exhibits no distension. There is no tenderness.  Musculoskeletal: Normal range of motion.  Lymphadenopathy:    She has no cervical adenopathy.  Neurological: She is alert and oriented to person, place, and time.  Skin: Skin is warm and dry.  Psychiatric: She has a normal mood and affect. Her behavior is normal. Judgment and thought content normal.    BP 116/68   Pulse 88   Temp 98.1 F (36.7 C) (Oral)   Ht _0  (1.626 m)   Wt 201 lb (91.2 kg)   BMI  34.50 kg/m  HbA1C: 8.4%    Assessment & Plan:  1. Essential hypertension, malignant Low sodium diet - CMP14+EGFR - furosemide (LASIX) 20 MG tablet; TAKE 1 TABLET (20 MG TOTAL) BY MOUTH DAILY.  Dispense: 30 tablet; Refill: 5  2. Type 2 diabetes mellitus with diabetic polyneuropathy, with long-term current use of insulin (HCC) Stricter carb counting Do not ration insulins,etc - Bayer DCA Hb A1c Waived - Microalbumin / creatinine urine ratio - metFORMIN (GLUCOPHAGE) 500 MG tablet; Take 1 tablet (500 mg total) by mouth 2 (two) times daily with a meal.  Dispense: 60 tablet; Refill: 5  3. Osteopenia, unspecified location Weight bearing exercises  4. Hyperlipidemia, unspecified hyperlipidemia type Low fat diet - Lipid panel - fenofibrate 160 MG tablet; Take 1 tablet (160 mg total) by mouth daily.  Dispense: 30 tablet; Refill: 5  5. Recurrent major depressive disorder, in full remission ([place]) Stress management - sertraline (ZOLOFT) 100 MG tablet; TAKE 1 &  1/2 TABLET BY MOUTH EVERY DAY  Dispense: 45 tablet; Refill: 5  6. GAD (generalized anxiety disorder) - LORazepam (ATIVAN) 1 MG tablet; Take 1 tablet (1 mg total) by mouth 2 (two) times daily as needed.  Dispense: 60 tablet; Refill: 2  7. BMI 32.0-32.9,adult Discussed diet and exercise for person with BMI >25 Will recheck weight in 3-6 months  8. Low serum vitamin D    Labs pending Health maintenance reviewed Diet and exercise encouraged Continue all meds Follow up  In 3 month   [place], FNP

## 2016-07-29 NOTE — Addendum Note (Signed)
Addended by: Chevis Pretty on: 07/29/2016 03:53 PM   Modules accepted: Orders

## 2016-07-29 NOTE — Patient Instructions (Signed)

## 2016-07-29 NOTE — Telephone Encounter (Signed)
Rx called in to pharmacy. 

## 2016-07-30 LAB — CMP14+EGFR
ALK PHOS: 53 IU/L (ref 39–117)
ALT: 10 IU/L (ref 0–32)
AST: 12 IU/L (ref 0–40)
Albumin/Globulin Ratio: 1.2 (ref 1.2–2.2)
Albumin: 3.8 g/dL (ref 3.6–4.8)
BUN/Creatinine Ratio: 21 (ref 12–28)
BUN: 24 mg/dL (ref 8–27)
CHLORIDE: 102 mmol/L (ref 96–106)
CO2: 24 mmol/L (ref 18–29)
Calcium: 9.5 mg/dL (ref 8.7–10.3)
Creatinine, Ser: 1.17 mg/dL — ABNORMAL HIGH (ref 0.57–1.00)
GFR calc Af Amer: 55 mL/min/{1.73_m2} — ABNORMAL LOW (ref >59)
GFR calc non Af Amer: 48 mL/min/{1.73_m2} — ABNORMAL LOW (ref >59)
Globulin, Total: 3.1 g/dL (ref 1.5–4.5)
Glucose: 278 mg/dL — ABNORMAL HIGH (ref 65–99)
POTASSIUM: 4.6 mmol/L (ref 3.5–5.2)
Sodium: 139 mmol/L (ref 134–144)
TOTAL PROTEIN: 6.9 g/dL (ref 6.0–8.5)

## 2016-07-30 LAB — LIPID PANEL
CHOLESTEROL TOTAL: 122 mg/dL (ref 100–199)
Chol/HDL Ratio: 4.7 ratio — ABNORMAL HIGH (ref 0.0–4.4)
HDL: 26 mg/dL — AB (ref >39)
LDL CALC: 58 mg/dL (ref 0–99)
TRIGLYCERIDES: 189 mg/dL — AB (ref 0–149)
VLDL CHOLESTEROL CAL: 38 mg/dL (ref 5–40)

## 2016-07-30 LAB — MICROALBUMIN / CREATININE URINE RATIO
Creatinine, Urine: 72.6 mg/dL
Microalb/Creat Ratio: 9.5 mg/g creat (ref 0.0–30.0)
Microalbumin, Urine: 6.9 ug/mL

## 2016-08-05 ENCOUNTER — Other Ambulatory Visit: Payer: Self-pay | Admitting: *Deleted

## 2016-08-05 DIAGNOSIS — F3342 Major depressive disorder, recurrent, in full remission: Secondary | ICD-10-CM

## 2016-08-05 MED ORDER — SERTRALINE HCL 100 MG PO TABS: ORAL_TABLET | ORAL | 1 refills | Status: DC

## 2016-08-06 ENCOUNTER — Encounter: Payer: Self-pay | Admitting: *Deleted

## 2016-08-30 ENCOUNTER — Other Ambulatory Visit: Payer: Self-pay | Admitting: Family Medicine

## 2016-08-31 NOTE — Telephone Encounter (Signed)
Last Vit D 07/09/15  17.1   Dr Dettinger

## 2016-09-03 ENCOUNTER — Other Ambulatory Visit: Payer: Self-pay | Admitting: Nurse Practitioner

## 2016-09-04 ENCOUNTER — Other Ambulatory Visit: Payer: Self-pay | Admitting: Nurse Practitioner

## 2016-09-08 ENCOUNTER — Telehealth: Payer: Self-pay | Admitting: Family Medicine

## 2016-09-09 DIAGNOSIS — H40033 Anatomical narrow angle, bilateral: Secondary | ICD-10-CM | POA: Diagnosis not present

## 2016-09-09 DIAGNOSIS — H40023 Open angle with borderline findings, high risk, bilateral: Secondary | ICD-10-CM | POA: Diagnosis not present

## 2016-11-05 ENCOUNTER — Other Ambulatory Visit: Payer: Self-pay | Admitting: Nurse Practitioner

## 2016-11-05 ENCOUNTER — Telehealth: Payer: Self-pay | Admitting: Family Medicine

## 2016-11-05 DIAGNOSIS — F411 Generalized anxiety disorder: Secondary | ICD-10-CM

## 2016-11-05 NOTE — Telephone Encounter (Signed)
Please call in lorazepam with 1 refills 

## 2016-11-05 NOTE — Telephone Encounter (Signed)
Last seen 07/29/16  MMM  If approved [facility]

## 2016-11-05 NOTE — Telephone Encounter (Signed)
Called patient to schedule a mammogram could not leave message patient mail box full.

## 2016-11-08 NOTE — Telephone Encounter (Signed)
[facility] 

## 2016-12-12 ENCOUNTER — Other Ambulatory Visit: Payer: Self-pay | Admitting: Nurse Practitioner

## 2016-12-12 DIAGNOSIS — F411 Generalized anxiety disorder: Secondary | ICD-10-CM

## 2016-12-13 NOTE — Telephone Encounter (Signed)
I don't know why this patient is assigned to me as the PCP is I have never actually seen her in person and she is seen Shelah Lewandowsky for her last 2 routine follow-ups. Please reassign her back to Shelah Lewandowsky and for this request to Shelah Lewandowsky.

## 2016-12-14 NOTE — Telephone Encounter (Signed)
Rx called to pharmacy

## 2016-12-14 NOTE — Telephone Encounter (Signed)
Please call in ativan with 0 refills Last refill without being seen

## 2016-12-15 ENCOUNTER — Other Ambulatory Visit: Payer: Self-pay | Admitting: Family

## 2016-12-27 ENCOUNTER — Ambulatory Visit (INDEPENDENT_AMBULATORY_CARE_PROVIDER_SITE_OTHER): Payer: Medicare HMO | Admitting: Nurse Practitioner

## 2016-12-27 ENCOUNTER — Other Ambulatory Visit: Payer: Self-pay | Admitting: Family

## 2016-12-27 ENCOUNTER — Encounter: Payer: Self-pay | Admitting: Nurse Practitioner

## 2016-12-27 VITALS — BP 140/64 | HR 89 | Temp 98.4°F | Ht 64.0 in | Wt 202.0 lb

## 2016-12-27 DIAGNOSIS — M545 Low back pain, unspecified: Secondary | ICD-10-CM

## 2016-12-27 DIAGNOSIS — G8929 Other chronic pain: Secondary | ICD-10-CM

## 2016-12-27 DIAGNOSIS — Z794 Long term (current) use of insulin: Secondary | ICD-10-CM

## 2016-12-27 DIAGNOSIS — I10 Essential (primary) hypertension: Secondary | ICD-10-CM

## 2016-12-27 DIAGNOSIS — F3342 Major depressive disorder, recurrent, in full remission: Secondary | ICD-10-CM | POA: Diagnosis not present

## 2016-12-27 DIAGNOSIS — R1011 Right upper quadrant pain: Secondary | ICD-10-CM

## 2016-12-27 DIAGNOSIS — E1142 Type 2 diabetes mellitus with diabetic polyneuropathy: Secondary | ICD-10-CM

## 2016-12-27 DIAGNOSIS — Z6832 Body mass index (BMI) 32.0-32.9, adult: Secondary | ICD-10-CM | POA: Diagnosis not present

## 2016-12-27 DIAGNOSIS — J0101 Acute recurrent maxillary sinusitis: Secondary | ICD-10-CM | POA: Diagnosis not present

## 2016-12-27 DIAGNOSIS — E785 Hyperlipidemia, unspecified: Secondary | ICD-10-CM | POA: Diagnosis not present

## 2016-12-27 DIAGNOSIS — Z23 Encounter for immunization: Secondary | ICD-10-CM

## 2016-12-27 DIAGNOSIS — M25561 Pain in right knee: Secondary | ICD-10-CM

## 2016-12-27 DIAGNOSIS — G4730 Sleep apnea, unspecified: Secondary | ICD-10-CM

## 2016-12-27 DIAGNOSIS — F411 Generalized anxiety disorder: Secondary | ICD-10-CM | POA: Diagnosis not present

## 2016-12-27 LAB — BAYER DCA HB A1C WAIVED: HB A1C: 8.5 % — AB (ref <7.0)

## 2016-12-27 MED ORDER — FUROSEMIDE 20 MG PO TABS: ORAL_TABLET | ORAL | 5 refills | Status: DC

## 2016-12-27 MED ORDER — FENOFIBRATE 160 MG PO TABS: 160.0000 mg | ORAL_TABLET | Freq: Every day | ORAL | 5 refills | Status: DC

## 2016-12-27 MED ORDER — METFORMIN HCL 500 MG PO TABS: 500.0000 mg | ORAL_TABLET | Freq: Two times a day (BID) | ORAL | 5 refills | Status: DC

## 2016-12-27 MED ORDER — LORAZEPAM 1 MG PO TABS: 1.0000 mg | ORAL_TABLET | Freq: Two times a day (BID) | ORAL | 2 refills | Status: DC

## 2016-12-27 MED ORDER — INSULIN GLARGINE 300 UNIT/ML ~~LOC~~ SOPN: 65.0000 [IU] | PEN_INJECTOR | Freq: Two times a day (BID) | SUBCUTANEOUS | 5 refills | Status: DC

## 2016-12-27 MED ORDER — AZITHROMYCIN 250 MG PO TABS: ORAL_TABLET | ORAL | 0 refills | Status: DC

## 2016-12-27 MED ORDER — FLUCONAZOLE 150 MG PO TABS: 150.0000 mg | ORAL_TABLET | Freq: Once | ORAL | 0 refills | Status: AC

## 2016-12-27 MED ORDER — CRISABOROLE 2 % EX OINT: 1.0000 "application " | TOPICAL_OINTMENT | Freq: Every day | CUTANEOUS | 1 refills | Status: DC

## 2016-12-27 MED ORDER — SERTRALINE HCL 100 MG PO TABS: ORAL_TABLET | ORAL | 1 refills | Status: DC

## 2016-12-27 NOTE — Patient Instructions (Signed)
Cholecystitis °Cholecystitis is swelling and irritation (inflammation) of the gallbladder. The gallbladder is an organ that is shaped like a pear. It is under the liver on the right side of the body. This condition is often caused by gallstones. You doctor may do tests to see how your gallbladder works. These tests may include: °· Imaging tests, such as: °? An ultrasound. °? MRI. °· Tests that check how your liver works. ° °This condition needs treatment. °Follow these instructions at home: °Home care will depend on your treatment. In general: °· Take over-the-counter and prescription medicines only as told by your doctor. °· If you were prescribed an antibiotic medicine, take it as told by your doctor. Do not stop taking the antibiotic even if you start to feel better. °· Follow instructions from your doctor about what to eat or drink. When you are allowed to eat, avoid eating or drinking anything that causes your symptoms to start. °· Keep all follow-up visits as told by your doctor. This is important. ° °Contact a doctor if: °· You have pain and your medicine does not help. °· You have a fever. °Get help right away if: °· Your pain moves to: °? Another part of your belly (abdomen). °? Your back. °· Your symptoms do not go away. °· You have new symptoms. °This information is not intended to replace advice given to you by your health care provider. Make sure you discuss any questions you have with your health care provider. °Document Released: 02/04/2011 Document Revised: 07/24/2015 Document Reviewed: 05/29/2014 °Elsevier Interactive Patient Education © 2018 [employer] ° °

## 2016-12-27 NOTE — Progress Notes (Signed)
Subjective:    Patient ID: Alexandra Hunt, female    DOB: Apr 15, 1945, 71 y.o.   MRN: [id number]  HPI  Alexandra Hunt is here today for follow up of chronic medical problem.  Outpatient Encounter Prescriptions as of 12/27/2016  Medication Sig  . Alcohol Swabs (B-D SINGLE USE SWABS REGULAR) PADS USE  UP  TO FOUR TIMES DAILY  WHILE  CHECKING BLOOD SUGAR  . aspirin 81 MG tablet Take 81 mg by mouth daily.  . B-D INS SYR ULTRAFINE 1CC/31G 31G X 5/16" 1 ML MISC USE DAILY WITH LANTUS AND HUMALOG INSULIN SLIDING SCALES AS INSTRUCTED  . B-D UF III MINI PEN NEEDLES 31G X 5 MM MISC USE WITH SLIDING SCALE HUMALOG 4 TIMES DAILY  . diclofenac (VOLTAREN) 75 MG EC tablet TAKE 1 TABLET BY MOUTH TWICE A DAY AS NEEDED  . fenofibrate 160 MG tablet Take 1 tablet (160 mg total) by mouth daily.  . furosemide (LASIX) 20 MG tablet TAKE 1 TABLET (20 MG TOTAL) BY MOUTH DAILY.  Marland Kitchen glucose blood (TRUE METRIX BLOOD GLUCOSE TEST) test strip Use as instructed  . ibuprofen (ADVIL,MOTRIN) 800 MG tablet TAKE 1 TABLET (800 MG TOTAL) BY MOUTH EVERY 8 (EIGHT) HOURS AS NEEDED.  Marland Kitchen ibuprofen (ADVIL,MOTRIN) 800 MG tablet TAKE 1 TABLET (800 MG TOTAL) BY MOUTH EVERY 8 (EIGHT) HOURS AS NEEDED.  Marland Kitchen Insulin Glargine (TOUJEO SOLOSTAR) 300 UNIT/ML SOPN Inject 60 Units into the skin 2 (two) times daily.  . Insulin Pen Needle (BD PEN NEEDLE NANO U/F) 32G X 4 MM MISC Use up to 6 times daily with toujeo and humalog.  . insulin regular (HUMULIN R) 250 units/2.29m (100 units/mL) injection Sliding scale: less than 80 (6u): 80-120 (11u): 121-150 (12u): 151-200 (13u): 210-250 (14u): 251-300 (16u): over 300 (18u)  . insulin regular (NOVOLIN R,HUMULIN R) 250 units/2.581m(100 units/mL) injection Up to 20u 4x a day Sliding scale  . lisinopril (PRINIVIL,ZESTRIL) 20 MG tablet Take 1 tablet (20 mg total) by mouth daily.  . Marland KitchenORazepam (ATIVAN) 1 MG tablet TAKE 1 TABLET BY MOUTH TWICE A DAY  . metFORMIN (GLUCOPHAGE) 500 MG tablet Take 1 tablet (500 mg total) by  mouth 2 (two) times daily with a meal.  . Multiple Vitamins-Minerals (THRIVE FOR LIFE WOMENS PO) Take by mouth daily.  . mupirocin cream (BACTROBAN) 2 % Apply 1 application topically 2 (two) times daily.  . ondansetron (ZOFRAN) 4 MG tablet   . ONE TOUCH ULTRA TEST test strip USE TO TEST BLOOD SUGAR AT LEAST FOUR TIMES A DAY (ON SLIDING SCALE)  . pseudoephedrine (SUDAFED) 30 MG tablet Take 30 mg by mouth 2 (two) times daily. Reported on 04/01/2015  . sertraline (ZOLOFT) 100 MG tablet TAKE 1 & 1/2 TABLET BY MOUTH EVERY DAY  . TOUJEO SOLOSTAR 300 UNIT/ML SOPN INJECT 60 UNITS INTO THE SKIN 2 (TWO) TIMES DAILY.  . Marland KitchenRUEPLUS LANCETS 33G MISC Check blood sugar up to 4 times daily  . Vitamin D, Ergocalciferol, (DRISDOL) 50000 units CAPS capsule TAKE 1 CAPSULE (50,000 UNITS TOTAL) BY MOUTH EVERY 7 (SEVEN) DAYS.   No facility-administered encounter medications on file as of 12/27/2016.     1. Essential hypertension, malignant  No c/o chest pain, SOB or headache. Does not check blood pressure at home BP Readings from Last 3 Encounters:  12/27/16 140/64  07/29/16 116/68  03/26/16 (!) 133/57     2. Type 2 diabetes mellitus with diabetic polyneuropathy, with long-term current use of insulin (HCC) last HGBA1c was  8.3%. Patient has never been compliant with her medication or her diet. We made no changes at last visit because patient was trying to ration her insulin and was not taking it correctly. She is no  Longer rationing but she forgets to take her insulin dose at night.  3. Hyperlipidemia, unspecified hyperlipidemia type  Does not watch diet  4. Sleep apnea, unspecified type  She will not wear her CPAP every night because she does not like it. Wears on occasion  5. GAD (generalized anxiety disorder)  She is on ativan 2x a day. Has a lot of family  22. Recurrent major depressive disorder, in full remission [facility])  patient has been on zoloft for several years- she says it works okay for her. She has a  lot of family issues and she says there is no way any medicine can fix her problems.   7. BMI 32.0-32.9,adult  No weight changes    New complaints:  -thinks she has a sinus infection- has had headache and facial pressure for several weeks and seems to be worsening. - c/o knee pain that is worsening- they want to give steroid injection but cant have because of blood sugar - constant back pain- has had injected several times. Says pain is 8/10 currently. Back pain is better when walking and worse when sitting and knee is just the opposite. - pain in RUQ 2-3 x a week after eating- rates pain 8/10 when occurs   Social history: Lives with husbands. Her grown children that are addicted to pain meds and their children live in their house.     Review of Systems  Constitutional: Negative.   HENT: Positive for congestion, sinus pain and sinus pressure. Negative for sore throat and trouble swallowing.   Respiratory: Negative.   Cardiovascular: Negative.   Gastrointestinal: Positive for abdominal pain (intermittent ruq pain).  Genitourinary: Negative.   Musculoskeletal: Positive for arthralgias (right knee) and back pain.  Neurological: Positive for headaches.  Psychiatric/Behavioral: Negative.   All other systems reviewed and are negative.      Objective:   Physical Exam  Constitutional: She is oriented to person, place, and time. She appears well-developed and well-nourished.  HENT:  Right Ear: Hearing, tympanic membrane, external ear and ear canal normal.  Left Ear: Hearing, tympanic membrane, external ear and ear canal normal.  Nose: Mucosal edema and rhinorrhea present. Right sinus exhibits maxillary sinus tenderness. Left sinus exhibits maxillary sinus tenderness.  Mouth/Throat: Uvula is midline and oropharynx is clear and moist.  Eyes: EOM are normal.  Neck: Trachea normal, normal range of motion and full passive range of motion without pain. Neck supple. No JVD present. Carotid  bruit is not present. No thyromegaly present.  Cardiovascular: Normal rate, regular rhythm, normal heart sounds and intact distal pulses.  Exam reveals no gallop and no friction rub.   No murmur heard. Pulmonary/Chest: Effort normal and breath sounds normal.  Abdominal: Soft. Bowel sounds are normal. She exhibits no distension and no mass. There is tenderness (slight RUQ pain on palpation).  Musculoskeletal: Normal range of motion.  From of right knee with crepitus on full flexion and extension. No joint effusion- no patella tenderness  Pain in back on flexion and extension of lumbar spine (-) SLR bil  Lymphadenopathy:    She has no cervical adenopathy.  Neurological: She is alert and oriented to person, place, and time. She has normal reflexes.  Skin: Skin is warm and dry.  Psychiatric: She has a normal mood  and affect. Her behavior is normal. Judgment and thought content normal.    BP 140/64   Pulse 89   Temp 98.4 F (36.9 C) (Oral)   Ht '5\' 4"'$  (1.626 m)   Wt 202 lb (91.6 kg)   BMI 34.67 kg/m   hgba1c 8.5%     Assessment & Plan:  1. Essential hypertension, malignant Low sodium diet - CMP14+EGFR - furosemide (LASIX) 20 MG tablet; TAKE 1 TABLET (20 MG TOTAL) BY MOUTH DAILY.  Dispense: 30 tablet; Refill: 5  2. Type 2 diabetes mellitus with diabetic polyneuropathy, with long-term current use of insulin (HCC) Stricter carb counting Increase toujeo to 65 u in AM nad 60 u in PM- lease try to take daily as rx - Bayer DCA Hb A1c Waived - Insulin Glargine (TOUJEO SOLOSTAR) 300 UNIT/ML SOPN; Inject 65 Units into the skin 2 (two) times daily.  Dispense: 5 pen; Refill: 5 - metFORMIN (GLUCOPHAGE) 500 MG tablet; Take 1 tablet (500 mg total) by mouth 2 (two) times daily with a meal.  Dispense: 60 tablet; Refill: 5  3. Hyperlipidemia, unspecified hyperlipidemia type Low fat diet - Lipid panel - fenofibrate 160 MG tablet; Take 1 tablet (160 mg total) by mouth daily.  Dispense: 30 tablet;  Refill: 5  4. Sleep apnea, unspecified type Wear CPAP  5. GAD (generalized anxiety disorder) Stress management - LORazepam (ATIVAN) 1 MG tablet; Take 1 tablet (1 mg total) by mouth 2 (two) times daily.  Dispense: 60 tablet; Refill: 2  6. Recurrent major depressive disorder, in full remission ([place]) - sertraline (ZOLOFT) 100 MG tablet; TAKE 1 & 1/2 TABLET BY MOUTH EVERY DAY  Dispense: 135 tablet; Refill: 1  7. BMI 32.0-32.9,adult Discussed diet and exercise for person with BMI >25 Will recheck weight in 3-6 months  8. Chronic pain of right knee Patient will make appointment to seeorthopedics  9. Acute midline low back pain without sciatica Patient wanted pan medication- discussed will need to go off of ativan if wants to go on pain medication- patient gt very upset and said she did not understand that. She said just forget it she will continue voltaren and just suffer.   10. Right upper quadrant pain -ordered gall bladder u/s Avoid spicy and fatty foods  11. Acute recurrent maxillary sinusitis 1. Take meds as prescribed 2. Use a cool mist humidifier especially during the winter months and when heat has been humid. 3. Use saline nose sprays frequently 4. Saline irrigations of the nose can be very helpful if done frequently.  * 4X daily for 1 week*  * Use of a nettie pot can be helpful with this. Follow directions with this* 5. Drink plenty of fluids 6. Keep thermostat turn down low 7.For any cough or congestion  Use plain Mucinex- regular strength or max strength is fine   * Children- consult with Pharmacist for dosing 8. For fever or aces or pains- take tylenol or ibuprofen appropriate for age and weight.  * for fevers greater than 101 orally you may alternate ibuprofen and tylenol every  3 hours.   - azithromycin (ZITHROMAX Z-PAK) 250 MG tablet; As directed  Dispense: 6 tablet; Refill: 0 - fluconazole (DIFLUCAN) 150 MG tablet; Take 1 tablet (150 mg total) by mouth once.   Dispense: 1 tablet; Refill: 0    Labs pending Health maintenance reviewed Diet and exercise encouraged Continue all meds Follow up  In 3 months    [place], FNP

## 2016-12-28 ENCOUNTER — Other Ambulatory Visit: Payer: Self-pay | Admitting: *Deleted

## 2016-12-28 LAB — LIPID PANEL
CHOLESTEROL TOTAL: 131 mg/dL (ref 100–199)
Chol/HDL Ratio: 4.9 ratio — ABNORMAL HIGH (ref 0.0–4.4)
HDL: 27 mg/dL — AB (ref >39)
LDL CALC: 64 mg/dL (ref 0–99)
Triglycerides: 200 mg/dL — ABNORMAL HIGH (ref 0–149)
VLDL CHOLESTEROL CAL: 40 mg/dL (ref 5–40)

## 2016-12-28 LAB — CMP14+EGFR
ALK PHOS: 56 IU/L (ref 39–117)
ALT: 9 IU/L (ref 0–32)
AST: 10 IU/L (ref 0–40)
Albumin/Globulin Ratio: 1.3 (ref 1.2–2.2)
Albumin: 4 g/dL (ref 3.6–4.8)
BUN / CREAT RATIO: 17 (ref 12–28)
BUN: 18 mg/dL (ref 8–27)
Bilirubin Total: <0.2 mg/dL (ref 0.0–1.2)
CHLORIDE: 104 mmol/L (ref 96–106)
CO2: 23 mmol/L (ref 20–29)
CREATININE: 1.09 mg/dL — AB (ref 0.57–1.00)
Calcium: 9.3 mg/dL (ref 8.7–10.3)
GFR calc Af Amer: 60 mL/min/{1.73_m2} (ref >59)
GFR calc non Af Amer: 52 mL/min/{1.73_m2} — ABNORMAL LOW (ref >59)
GLUCOSE: 151 mg/dL — AB (ref 65–99)
Globulin, Total: 3 g/dL (ref 1.5–4.5)
Potassium: 4.8 mmol/L (ref 3.5–5.2)
Sodium: 143 mmol/L (ref 134–144)
TOTAL PROTEIN: 7 g/dL (ref 6.0–8.5)

## 2016-12-28 NOTE — Telephone Encounter (Signed)
Last Vit D 07/09/15  17.1 

## 2016-12-30 ENCOUNTER — Telehealth: Payer: Self-pay | Admitting: Nurse Practitioner

## 2017-01-05 ENCOUNTER — Ambulatory Visit (HOSPITAL_COMMUNITY): Admission: RE | Admit: 2017-01-05 | Payer: Self-pay | Source: Ambulatory Visit

## 2017-01-06 ENCOUNTER — Encounter: Payer: Self-pay | Admitting: *Deleted

## 2017-01-10 ENCOUNTER — Other Ambulatory Visit: Payer: Self-pay | Admitting: Nurse Practitioner

## 2017-01-10 NOTE — Telephone Encounter (Signed)
Please advise on [place]

## 2017-01-11 NOTE — Telephone Encounter (Signed)
There is nothing else like that- can use hydrocortizone cream OTC

## 2017-01-11 NOTE — Telephone Encounter (Signed)
Patient notified about cream. Patient verbalized understanding

## 2017-01-12 ENCOUNTER — Other Ambulatory Visit: Payer: Self-pay | Admitting: Nurse Practitioner

## 2017-01-14 ENCOUNTER — Other Ambulatory Visit: Payer: Self-pay | Admitting: Family Medicine

## 2017-01-17 ENCOUNTER — Ambulatory Visit (HOSPITAL_COMMUNITY): Payer: Self-pay

## 2017-01-28 ENCOUNTER — Other Ambulatory Visit (HOSPITAL_COMMUNITY): Payer: Self-pay

## 2017-01-31 ENCOUNTER — Ambulatory Visit (HOSPITAL_COMMUNITY)
Admission: RE | Admit: 2017-01-31 | Discharge: 2017-01-31 | Disposition: A | Discharge status: Home / Self Care | Payer: Medicare HMO | Source: Ambulatory Visit | Attending: Nurse Practitioner | Admitting: Nurse Practitioner

## 2017-01-31 DIAGNOSIS — R1011 Right upper quadrant pain: Secondary | ICD-10-CM | POA: Diagnosis not present

## 2017-01-31 DIAGNOSIS — K8020 Calculus of gallbladder without cholecystitis without obstruction: Secondary | ICD-10-CM | POA: Diagnosis not present

## 2017-02-03 DIAGNOSIS — R51 Headache: Secondary | ICD-10-CM | POA: Diagnosis not present

## 2017-02-03 DIAGNOSIS — H40023 Open angle with borderline findings, high risk, bilateral: Secondary | ICD-10-CM | POA: Diagnosis not present

## 2017-02-03 DIAGNOSIS — H40033 Anatomical narrow angle, bilateral: Secondary | ICD-10-CM | POA: Diagnosis not present

## 2017-02-03 DIAGNOSIS — H04213 Epiphora due to excess lacrimation, bilateral lacrimal glands: Secondary | ICD-10-CM | POA: Diagnosis not present

## 2017-02-11 ENCOUNTER — Telehealth: Payer: Self-pay | Admitting: Nurse Practitioner

## 2017-02-11 DIAGNOSIS — K8020 Calculus of gallbladder without cholecystitis without obstruction: Secondary | ICD-10-CM

## 2017-02-11 MED ORDER — ALPRAZOLAM 1 MG PO TABS: 1.0000 mg | ORAL_TABLET | Freq: Two times a day (BID) | ORAL | 0 refills | Status: AC | PRN

## 2017-02-11 NOTE — Telephone Encounter (Signed)
Pt's husband recently passed away Visitation and funeral are Saturday and Sunday Pt requesting something stronger than Ativan for these 2 days only Please advise

## 2017-02-11 NOTE — Telephone Encounter (Signed)
Pt notified of [place] results Verbalizes understanding

## 2017-02-11 NOTE — Telephone Encounter (Signed)
Called in # 4 tabs of 1 mg [facility] to [facility] and informed patient not to take with ativan.    Alexandra Apple, MD [facility] 02/11/2017, 7:15 PM

## 2017-02-14 NOTE — Telephone Encounter (Signed)
Can not leave a message due to full mail box.

## 2017-02-18 ENCOUNTER — Encounter: Payer: Self-pay | Admitting: *Deleted

## 2017-02-24 NOTE — Telephone Encounter (Signed)
Attempted to contact patient- no call back. This encounter will be closed. 

## 2017-02-25 ENCOUNTER — Telehealth: Payer: Self-pay

## 2017-02-25 ENCOUNTER — Other Ambulatory Visit: Payer: Self-pay | Admitting: Nurse Practitioner

## 2017-02-25 DIAGNOSIS — F411 Generalized anxiety disorder: Secondary | ICD-10-CM

## 2017-02-25 MED ORDER — LORAZEPAM 1 MG PO TABS: 1.0000 mg | ORAL_TABLET | Freq: Three times a day (TID) | ORAL | 2 refills | Status: DC

## 2017-02-25 NOTE — Telephone Encounter (Signed)
Patient called in crying about her husband who has passed away un expectedly 15 days ago. Patient called in 12/14 and Dr. Wendi Snipes sent her in Xanax #4 to get through his services. Patient states that she broke two of the pills in half and took the other two whole. States that it did help her and is requesting a refill. Aware that she does ntbs since she was last seen 12/27/16- verbalizes understanding but wants a refill until she is able to come in. No openings today. Patient states she is not going to hurt hurt herself in any way and that her daughter is there with her but she "needs something to calm her down".  Please advise and if approved [facility] [place].

## 2017-02-25 NOTE — Progress Notes (Signed)
Told patient could not and would not write rx for xanax since she is on ativan- increased ativan to tid but this is only temporary

## 2017-02-27 ENCOUNTER — Other Ambulatory Visit: Payer: Self-pay | Admitting: Family Medicine

## 2017-02-27 ENCOUNTER — Other Ambulatory Visit: Payer: Self-pay | Admitting: Nurse Practitioner

## 2017-02-27 DIAGNOSIS — J0101 Acute recurrent maxillary sinusitis: Secondary | ICD-10-CM

## 2017-03-02 NOTE — Telephone Encounter (Signed)
Patient just had filled last week

## 2017-03-02 NOTE — Telephone Encounter (Signed)
Last seen 10/18  MMM

## 2017-03-02 NOTE — Telephone Encounter (Signed)
Last seen 12/27/16  MMM 

## 2017-03-07 DIAGNOSIS — Z86010 Personal history of colonic polyps: Secondary | ICD-10-CM | POA: Diagnosis not present

## 2017-03-07 DIAGNOSIS — R198 Other specified symptoms and signs involving the digestive system and abdomen: Secondary | ICD-10-CM | POA: Diagnosis not present

## 2017-03-07 DIAGNOSIS — R0789 Other chest pain: Secondary | ICD-10-CM | POA: Diagnosis not present

## 2017-03-07 DIAGNOSIS — K8020 Calculus of gallbladder without cholecystitis without obstruction: Secondary | ICD-10-CM | POA: Diagnosis not present

## 2017-03-07 DIAGNOSIS — G8929 Other chronic pain: Secondary | ICD-10-CM | POA: Diagnosis not present

## 2017-03-21 ENCOUNTER — Other Ambulatory Visit: Payer: Self-pay | Admitting: Nurse Practitioner

## 2017-03-21 NOTE — Telephone Encounter (Signed)
Last Vit D 07/09/15  17.1 

## 2017-03-28 ENCOUNTER — Other Ambulatory Visit: Payer: Self-pay | Admitting: Nurse Practitioner

## 2017-04-12 ENCOUNTER — Other Ambulatory Visit: Payer: Self-pay | Admitting: Nurse Practitioner

## 2017-05-26 ENCOUNTER — Other Ambulatory Visit: Payer: Self-pay | Admitting: Family Medicine

## 2017-06-05 ENCOUNTER — Other Ambulatory Visit: Payer: Self-pay | Admitting: Nurse Practitioner

## 2017-06-05 DIAGNOSIS — F3342 Major depressive disorder, recurrent, in full remission: Secondary | ICD-10-CM

## 2017-06-05 DIAGNOSIS — I10 Essential (primary) hypertension: Secondary | ICD-10-CM

## 2017-06-06 NOTE — Telephone Encounter (Signed)
Last seen 12/27/16  MMM Last Vit D 07/09/15  17.1

## 2017-06-06 NOTE — Telephone Encounter (Signed)
Last refill without being seen 

## 2017-08-12 ENCOUNTER — Other Ambulatory Visit: Payer: Self-pay | Admitting: Nurse Practitioner

## 2017-08-12 DIAGNOSIS — E785 Hyperlipidemia, unspecified: Secondary | ICD-10-CM

## 2017-08-15 NOTE — Telephone Encounter (Signed)
Last refill without being seen 

## 2017-08-18 ENCOUNTER — Other Ambulatory Visit: Payer: Medicare HMO

## 2017-08-18 ENCOUNTER — Other Ambulatory Visit: Payer: Self-pay | Admitting: Pediatrics

## 2017-08-18 ENCOUNTER — Ambulatory Visit (INDEPENDENT_AMBULATORY_CARE_PROVIDER_SITE_OTHER): Payer: Medicare HMO

## 2017-08-18 DIAGNOSIS — G8929 Other chronic pain: Secondary | ICD-10-CM

## 2017-08-18 DIAGNOSIS — M25561 Pain in right knee: Secondary | ICD-10-CM

## 2017-08-18 DIAGNOSIS — M25562 Pain in left knee: Principal | ICD-10-CM

## 2017-08-18 DIAGNOSIS — M1712 Unilateral primary osteoarthritis, left knee: Secondary | ICD-10-CM | POA: Diagnosis not present

## 2017-08-18 DIAGNOSIS — M1711 Unilateral primary osteoarthritis, right knee: Secondary | ICD-10-CM | POA: Diagnosis not present

## 2017-08-31 ENCOUNTER — Telehealth: Payer: Self-pay | Admitting: *Deleted

## 2017-08-31 MED ORDER — BASAGLAR KWIKPEN 100 UNIT/ML ~~LOC~~ SOPN: 65.0000 [IU] | PEN_INJECTOR | Freq: Every day | SUBCUTANEOUS | 0 refills | Status: DC

## 2017-08-31 NOTE — Telephone Encounter (Signed)
No Toujeo available. Samples of Basaglar were available. Discussed with Dr Lajuana Ripple. 3 pens provided. Dosage is the same, 65 units BID. Patient aware that samples are ready for pickup.

## 2017-09-04 ENCOUNTER — Other Ambulatory Visit: Payer: Self-pay | Admitting: Nurse Practitioner

## 2017-09-05 NOTE — Telephone Encounter (Signed)
Last Vit D 17.1  07/09/15

## 2017-09-13 ENCOUNTER — Other Ambulatory Visit: Payer: Self-pay | Admitting: Nurse Practitioner

## 2017-09-13 DIAGNOSIS — E785 Hyperlipidemia, unspecified: Secondary | ICD-10-CM

## 2017-09-14 ENCOUNTER — Other Ambulatory Visit: Payer: Self-pay | Admitting: Nurse Practitioner

## 2017-09-14 ENCOUNTER — Other Ambulatory Visit: Payer: Self-pay | Admitting: *Deleted

## 2017-09-14 DIAGNOSIS — F3342 Major depressive disorder, recurrent, in full remission: Secondary | ICD-10-CM

## 2017-09-14 DIAGNOSIS — I10 Essential (primary) hypertension: Secondary | ICD-10-CM

## 2017-09-14 NOTE — Telephone Encounter (Signed)
Last lipid 12/26/16  MMM

## 2017-09-15 ENCOUNTER — Other Ambulatory Visit: Payer: Self-pay | Admitting: Nurse Practitioner

## 2017-09-15 DIAGNOSIS — F411 Generalized anxiety disorder: Secondary | ICD-10-CM

## 2017-09-15 MED ORDER — SERTRALINE HCL 100 MG PO TABS: 150.0000 mg | ORAL_TABLET | Freq: Every day | ORAL | 0 refills | Status: DC

## 2017-09-15 MED ORDER — LISINOPRIL 20 MG PO TABS: 20.0000 mg | ORAL_TABLET | Freq: Every day | ORAL | 0 refills | Status: DC

## 2017-09-16 NOTE — Telephone Encounter (Signed)
Last seen 12/27/16  MMM

## 2017-09-16 NOTE — Telephone Encounter (Signed)
Needs to be seen

## 2017-09-16 NOTE — Telephone Encounter (Signed)
Mailbox was full, couldn't leave message.

## 2017-09-17 ENCOUNTER — Other Ambulatory Visit: Payer: Self-pay | Admitting: Nurse Practitioner

## 2017-09-17 DIAGNOSIS — E785 Hyperlipidemia, unspecified: Secondary | ICD-10-CM

## 2017-09-17 DIAGNOSIS — I10 Essential (primary) hypertension: Secondary | ICD-10-CM

## 2017-09-17 NOTE — Telephone Encounter (Signed)
Last seen 12/27/16  MMM

## 2017-09-18 NOTE — Telephone Encounter (Signed)
Last refill without being seen 

## 2017-09-22 ENCOUNTER — Ambulatory Visit: Payer: Medicare HMO | Admitting: Nurse Practitioner

## 2017-09-28 DIAGNOSIS — L259 Unspecified contact dermatitis, unspecified cause: Secondary | ICD-10-CM | POA: Diagnosis not present

## 2017-09-28 DIAGNOSIS — M1711 Unilateral primary osteoarthritis, right knee: Secondary | ICD-10-CM | POA: Diagnosis not present

## 2017-10-06 DIAGNOSIS — L438 Other lichen planus: Secondary | ICD-10-CM | POA: Diagnosis not present

## 2017-10-06 DIAGNOSIS — C44622 Squamous cell carcinoma of skin of right upper limb, including shoulder: Secondary | ICD-10-CM | POA: Diagnosis not present

## 2017-10-06 DIAGNOSIS — L820 Inflamed seborrheic keratosis: Secondary | ICD-10-CM | POA: Diagnosis not present

## 2017-10-06 DIAGNOSIS — D485 Neoplasm of uncertain behavior of skin: Secondary | ICD-10-CM | POA: Diagnosis not present

## 2017-10-06 DIAGNOSIS — L309 Dermatitis, unspecified: Secondary | ICD-10-CM | POA: Diagnosis not present

## 2017-10-08 ENCOUNTER — Other Ambulatory Visit: Payer: Self-pay | Admitting: Pediatrics

## 2017-10-08 DIAGNOSIS — F3342 Major depressive disorder, recurrent, in full remission: Secondary | ICD-10-CM

## 2017-10-10 NOTE — Telephone Encounter (Signed)
Last seen 12-27-16

## 2017-10-12 ENCOUNTER — Other Ambulatory Visit: Payer: Self-pay | Admitting: Nurse Practitioner

## 2017-10-12 DIAGNOSIS — E1142 Type 2 diabetes mellitus with diabetic polyneuropathy: Secondary | ICD-10-CM

## 2017-10-12 DIAGNOSIS — Z794 Long term (current) use of insulin: Principal | ICD-10-CM

## 2017-10-12 NOTE — Telephone Encounter (Signed)
Last refill without being seen 

## 2017-10-12 NOTE — Telephone Encounter (Signed)
Last seen 12-27-16

## 2017-10-13 ENCOUNTER — Other Ambulatory Visit: Payer: Self-pay | Admitting: Pediatrics

## 2017-10-13 DIAGNOSIS — I10 Essential (primary) hypertension: Secondary | ICD-10-CM

## 2017-10-13 NOTE — Telephone Encounter (Signed)
Last seen 12-27-16

## 2017-10-18 ENCOUNTER — Ambulatory Visit: Payer: Medicare HMO | Admitting: Nurse Practitioner

## 2017-10-26 ENCOUNTER — Encounter: Payer: Self-pay | Admitting: Nurse Practitioner

## 2017-10-26 DIAGNOSIS — L080 Pyoderma: Secondary | ICD-10-CM | POA: Diagnosis not present

## 2017-10-26 DIAGNOSIS — L98491 Non-pressure chronic ulcer of skin of other sites limited to breakdown of skin: Secondary | ICD-10-CM | POA: Diagnosis not present

## 2017-10-26 DIAGNOSIS — D485 Neoplasm of uncertain behavior of skin: Secondary | ICD-10-CM | POA: Diagnosis not present

## 2017-11-09 ENCOUNTER — Encounter: Payer: Self-pay | Admitting: Nurse Practitioner

## 2017-11-09 ENCOUNTER — Ambulatory Visit (INDEPENDENT_AMBULATORY_CARE_PROVIDER_SITE_OTHER): Payer: Medicare HMO | Admitting: Nurse Practitioner

## 2017-11-09 VITALS — BP 120/58 | HR 106 | Temp 97.6°F | Ht 64.0 in | Wt 202.8 lb

## 2017-11-09 DIAGNOSIS — E782 Mixed hyperlipidemia: Secondary | ICD-10-CM

## 2017-11-09 DIAGNOSIS — G4730 Sleep apnea, unspecified: Secondary | ICD-10-CM

## 2017-11-09 DIAGNOSIS — R7989 Other specified abnormal findings of blood chemistry: Secondary | ICD-10-CM | POA: Diagnosis not present

## 2017-11-09 DIAGNOSIS — F3342 Major depressive disorder, recurrent, in full remission: Secondary | ICD-10-CM

## 2017-11-09 DIAGNOSIS — Z6832 Body mass index (BMI) 32.0-32.9, adult: Secondary | ICD-10-CM | POA: Diagnosis not present

## 2017-11-09 DIAGNOSIS — M8580 Other specified disorders of bone density and structure, unspecified site: Secondary | ICD-10-CM | POA: Diagnosis not present

## 2017-11-09 DIAGNOSIS — Z794 Long term (current) use of insulin: Secondary | ICD-10-CM | POA: Diagnosis not present

## 2017-11-09 DIAGNOSIS — F411 Generalized anxiety disorder: Secondary | ICD-10-CM

## 2017-11-09 DIAGNOSIS — I10 Essential (primary) hypertension: Secondary | ICD-10-CM

## 2017-11-09 DIAGNOSIS — E1142 Type 2 diabetes mellitus with diabetic polyneuropathy: Secondary | ICD-10-CM | POA: Diagnosis not present

## 2017-11-09 LAB — BAYER DCA HB A1C WAIVED: HB A1C (BAYER DCA - WAIVED): 9 % — ABNORMAL HIGH (ref <7.0)

## 2017-11-09 MED ORDER — INSULIN GLARGINE 300 UNIT/ML ~~LOC~~ SOPN: 70.0000 [IU] | PEN_INJECTOR | Freq: Two times a day (BID) | SUBCUTANEOUS | 5 refills | Status: DC

## 2017-11-09 MED ORDER — FUROSEMIDE 20 MG PO TABS: ORAL_TABLET | ORAL | 1 refills | Status: DC

## 2017-11-09 MED ORDER — METFORMIN HCL 500 MG PO TABS: 500.0000 mg | ORAL_TABLET | Freq: Two times a day (BID) | ORAL | 1 refills | Status: DC

## 2017-11-09 MED ORDER — INSULIN GLARGINE 300 UNIT/ML ~~LOC~~ SOPN: 65.0000 [IU] | PEN_INJECTOR | Freq: Two times a day (BID) | SUBCUTANEOUS | 5 refills | Status: DC

## 2017-11-09 MED ORDER — FENOFIBRATE 160 MG PO TABS: 160.0000 mg | ORAL_TABLET | Freq: Every day | ORAL | 1 refills | Status: DC

## 2017-11-09 MED ORDER — LISINOPRIL 20 MG PO TABS: 20.0000 mg | ORAL_TABLET | Freq: Every day | ORAL | 1 refills | Status: DC

## 2017-11-09 MED ORDER — LORAZEPAM 1 MG PO TABS: 1.0000 mg | ORAL_TABLET | Freq: Three times a day (TID) | ORAL | 2 refills | Status: DC

## 2017-11-09 MED ORDER — INSULIN REGULAR HUMAN 100 UNIT/ML IJ SOLN: INTRAMUSCULAR | 5 refills | Status: DC

## 2017-11-09 MED ORDER — SERTRALINE HCL 100 MG PO TABS: 200.0000 mg | ORAL_TABLET | Freq: Every day | ORAL | 1 refills | Status: DC

## 2017-11-09 NOTE — Patient Instructions (Signed)
Diabetes Mellitus and Nutrition When you have diabetes (diabetes mellitus), it is very important to have healthy eating habits because your blood sugar (glucose) levels are greatly affected by what you eat and drink. Eating healthy foods in the appropriate amounts, at about the same times every day, can help you:  Control your blood glucose.  Lower your risk of heart disease.  Improve your blood pressure.  Reach or maintain a healthy weight.  Every person with diabetes is different, and each person has different needs for a meal plan. Your health care provider may recommend that you work with a diet and nutrition specialist (dietitian) to make a meal plan that is best for you. Your meal plan may vary depending on factors such as:  The calories you need.  The medicines you take.  Your weight.  Your blood glucose, blood pressure, and cholesterol levels.  Your activity level.  Other health conditions you have, such as heart or kidney disease.  How do carbohydrates affect me? Carbohydrates affect your blood glucose level more than any other type of food. Eating carbohydrates naturally increases the amount of glucose in your blood. Carbohydrate counting is a method for keeping track of how many carbohydrates you eat. Counting carbohydrates is important to keep your blood glucose at a healthy level, especially if you use insulin or take certain oral diabetes medicines. It is important to know how many carbohydrates you can safely have in each meal. This is different for every person. Your dietitian can help you calculate how many carbohydrates you should have at each meal and for snack. Foods that contain carbohydrates include:  Bread, cereal, rice, pasta, and crackers.  Potatoes and corn.  Peas, beans, and lentils.  Milk and yogurt.  Fruit and juice.  Desserts, such as cakes, cookies, ice cream, and candy.  How does alcohol affect me? Alcohol can cause a sudden decrease in blood  glucose (hypoglycemia), especially if you use insulin or take certain oral diabetes medicines. Hypoglycemia can be a life-threatening condition. Symptoms of hypoglycemia (sleepiness, dizziness, and confusion) are similar to symptoms of having too much alcohol. If your health care provider says that alcohol is safe for you, follow these guidelines:  Limit alcohol intake to no more than 1 drink per day for nonpregnant women and 2 drinks per day for men. One drink equals 12 oz of beer, 5 oz of wine, or 1 oz of hard liquor.  Do not drink on an empty stomach.  Keep yourself hydrated with water, diet soda, or unsweetened iced tea.  Keep in mind that regular soda, juice, and other mixers may contain a lot of sugar and must be counted as carbohydrates.  What are tips for following this plan? Reading food labels  Start by checking the serving size on the label. The amount of calories, carbohydrates, fats, and other nutrients listed on the label are based on one serving of the food. Many foods contain more than one serving per package.  Check the total grams (g) of carbohydrates in one serving. You can calculate the number of servings of carbohydrates in one serving by dividing the total carbohydrates by 15. For example, if a food has 30 g of total carbohydrates, it would be equal to 2 servings of carbohydrates.  Check the number of grams (g) of saturated and trans fats in one serving. Choose foods that have low or no amount of these fats.  Check the number of milligrams (mg) of sodium in one serving. Most people   should limit total sodium intake to less than 2,300 mg per day.  Always check the nutrition information of foods labeled as "low-fat" or "nonfat". These foods may be higher in added sugar or refined carbohydrates and should be avoided.  Talk to your dietitian to identify your daily goals for nutrients listed on the label. Shopping  Avoid buying canned, premade, or processed foods. These  foods tend to be high in fat, sodium, and added sugar.  Shop around the outside edge of the grocery store. This includes fresh fruits and vegetables, bulk grains, fresh meats, and fresh dairy. Cooking  Use low-heat cooking methods, such as baking, instead of high-heat cooking methods like deep frying.  Cook using healthy oils, such as olive, canola, or sunflower oil.  Avoid cooking with butter, cream, or high-fat meats. Meal planning  Eat meals and snacks regularly, preferably at the same times every day. Avoid going long periods of time without eating.  Eat foods high in fiber, such as fresh fruits, vegetables, beans, and whole grains. Talk to your dietitian about how many servings of carbohydrates you can eat at each meal.  Eat 4-6 ounces of lean protein each day, such as lean meat, chicken, fish, eggs, or tofu. 1 ounce is equal to 1 ounce of meat, chicken, or fish, 1 egg, or 1/4 cup of tofu.  Eat some foods each day that contain healthy fats, such as avocado, nuts, seeds, and fish. Lifestyle   Check your blood glucose regularly.  Exercise at least 30 minutes 5 or more days each week, or as told by your health care provider.  Take medicines as told by your health care provider.  Do not use any products that contain nicotine or tobacco, such as cigarettes and e-cigarettes. If you need help quitting, ask your health care provider.  Work with a counselor or diabetes educator to identify strategies to manage stress and any emotional and social challenges. What are some questions to ask my health care provider?  Do I need to meet with a diabetes educator?  Do I need to meet with a dietitian?  What number can I call if I have questions?  When are the best times to check my blood glucose? Where to find more information:  American Diabetes Association: diabetes.org/food-and-fitness/food  Academy of Nutrition and Dietetics:  www.eatright.org/resources/health/diseases-and-conditions/diabetes  National Institute of Diabetes and Digestive and Kidney Diseases (NIH): www.niddk.nih.gov/health-information/diabetes/overview/diet-eating-physical-activity Summary  A healthy meal plan will help you control your blood glucose and maintain a healthy lifestyle.  Working with a diet and nutrition specialist (dietitian) can help you make a meal plan that is best for you.  Keep in mind that carbohydrates and alcohol have immediate effects on your blood glucose levels. It is important to count carbohydrates and to use alcohol carefully. This information is not intended to replace advice given to you by your health care provider. Make sure you discuss any questions you have with your health care provider. Document Released: 11/12/2004 Document Revised: 03/22/2016 Document Reviewed: 03/22/2016 Elsevier Interactive Patient Education  2018 [employer]  

## 2017-11-09 NOTE — Progress Notes (Signed)
Subjective:    Patient ID: Alexandra Hunt, female    DOB: 04-12-1945, 72 y.o.   MRN: [id number]   Chief Complaint: Medical Management of Chronic Issues   HPI:  1. Essential hypertension, malignant  No c/o chest pain, sob or headache. Does not check blood pressure at home. BP Readings from Last 3 Encounters:  12/27/16 140/64  07/29/16 116/68  03/26/16 (!) 133/57     2. Sleep apnea, unspecified type  CPAP is broke so she has not used in awhile.  3. Type 2 diabetes mellitus with diabetic polyneuropathy, with long-term current use of insulin (HCC)  Last hgab1c was 8.5. Has not been checked in over a year. Patient is very noncompliant with diet and medication. She does not do sliding scale 3x a day as instructed nor does she check bllod sugars 3x a day like she should.  4. Osteopenia, unspecified location  Last dexascan was 05/05/15 with t score of -1.7. She does no weight bearing exercise. deneis any back pain.  5. Low serum vitamin D  Is noncompliant about taking daily vitamin d  6. Hyperlipidemia, mixed Does not watch diet. Takes fenofibrate daily but refuses statin due  To body aches.  7. GAD (generalized anxiety disorder)  Has been really bad since her husband died last year GAD 7 : Generalized Anxiety Score 11/09/2017 11/27/2015  Nervous, Anxious, on Edge 2 3  Control/stop worrying 3 3  Worry too much - different things 3 3  Trouble relaxing 2 3  Restless 2 3  Easily annoyed or irritable 3 2  Afraid - awful might happen 3 3  Total GAD 7 Score 18 20  Anxiety Difficulty - Very difficult      8. Recurrent major depressive disorder, in full remission ([place])  She is on zoloft '100mg'$  daily. She says she is very depressed. Has drug addicts living with her. Depression screen [facility] 2/9 11/09/2017 12/27/2016 07/29/2016 03/26/2016 01/10/2016  Decreased Interest 3 0 2 0 0  Down, Depressed, Hopeless 3 0 2 0 0  PHQ - 2 Score 6 0 4 0 0  Altered sleeping 2 - 2 - -  Tired, decreased energy 2  - 2 - -  Change in appetite 2 - 1 - -  Feeling bad or failure about yourself  1 - 1 - -  Trouble concentrating 1 - 0 - -  Moving slowly or fidgety/restless 0 - 0 - -  Suicidal thoughts 0 - 1 - -  PHQ-9 Score 14 - 11 - -  Difficult doing work/chores - - - - -  Some recent data might be hidden     9. BMI 32.0-32.9,adult unspecified hyperlipidemia type  no recent weight changes    Outpatient Encounter Medications as of 11/09/2017  Medication Sig  . Alcohol Swabs (B-D SINGLE USE SWABS REGULAR) PADS USE  UP  TO FOUR TIMES DAILY  WHILE  CHECKING BLOOD SUGAR  . aspirin 81 MG tablet Take 81 mg by mouth daily.  . B-D INS SYR ULTRAFINE 1CC/31G 31G X 5/16" 1 ML MISC USE DAILY WITH LANTUS AND HUMALOG INSULIN SLIDING SCALES AS INSTRUCTED  . B-D UF III MINI PEN NEEDLES 31G X 5 MM MISC USE WITH SLIDING SCALE HUMALOG 4 TIMES DAILY  . Crisaborole (EUCRISA) 2 % OINT Apply 1 application topically daily.  . diclofenac (VOLTAREN) 75 MG EC tablet TAKE 1 TABLET BY MOUTH TWICE A DAY AS NEEDED  . fenofibrate 160 MG tablet TAKE 1 TABLET BY MOUTH EVERY  DAY  . furosemide (LASIX) 20 MG tablet TAKE 1 TABLET BY MOUTH EVERY DAY  . glucose blood (TRUE METRIX BLOOD GLUCOSE TEST) test strip Use to check blood glucose up to four times a day  . ibuprofen (ADVIL,MOTRIN) 800 MG tablet TAKE 1 TABLET (800 MG TOTAL) BY MOUTH EVERY 8 (EIGHT) HOURS AS NEEDED.  Marland Kitchen Insulin Glargine (BASAGLAR KWIKPEN) 100 UNIT/ML SOPN Inject 0.65 mLs (65 Units total) into the skin daily.  . Insulin Glargine (TOUJEO SOLOSTAR) 300 UNIT/ML SOPN Inject 65 Units into the skin 2 (two) times daily.  . Insulin Pen Needle (BD PEN NEEDLE NANO U/F) 32G X 4 MM MISC Use up to 6 times daily with toujeo and humalog.  . insulin regular (HUMULIN R) 250 units/2.33m (100 units/mL) injection Sliding scale: less than 80 (6u): 80-120 (11u): 121-150 (12u): 151-200 (13u): 210-250 (14u): 251-300 (16u): over 300 (18u)  . lisinopril (PRINIVIL,ZESTRIL) 20 MG tablet TAKE 1  TABLET BY MOUTH EVERY DAY  . LORazepam (ATIVAN) 1 MG tablet Take 1 tablet (1 mg total) by mouth 3 (three) times daily.  . metFORMIN (GLUCOPHAGE) 500 MG tablet TAKE 1 TABLET (500 MG TOTAL) BY MOUTH 2 (TWO) TIMES DAILY WITH A MEAL.  . Multiple Vitamins-Minerals (THRIVE FOR LIFE WOMENS PO) Take by mouth daily.  . mupirocin cream (BACTROBAN) 2 % Apply 1 application topically 2 (two) times daily.  . ondansetron (ZOFRAN) 4 MG tablet   . ONE TOUCH ULTRA TEST test strip USE TO TEST BLOOD SUGAR AT LEAST FOUR TIMES A DAY (ON SLIDING SCALE)  . pseudoephedrine (SUDAFED) 30 MG tablet Take 30 mg by mouth 2 (two) times daily. Reported on 04/01/2015  . sertraline (ZOLOFT) 100 MG tablet TAKE 1.5 TABLETS (150 MG TOTAL) BY MOUTH DAILY.  .Marland KitchenTRUEPLUS LANCETS 33G MISC Check blood sugar up to 4 times daily  . Vitamin D, Ergocalciferol, (DRISDOL) 50000 units CAPS capsule TAKE 1 CAPSULE (50,000 UNITS TOTAL) BY MOUTH EVERY 7 (SEVEN) DAYS.      New complaints: Is going to [facility] for skin issues and has had several biopsies  Social history: Her husband died last year and she has some family that live with her that are on drugs.  Review of Systems  Constitutional: Negative.  Negative for activity change and appetite change.  HENT: Negative.   Eyes: Negative for pain.  Respiratory: Negative.  Negative for shortness of breath.   Cardiovascular: Negative.  Negative for chest pain, palpitations and leg swelling.  Gastrointestinal: Negative for abdominal pain.  Endocrine: Negative for polydipsia.  Genitourinary: Negative.   Skin: Negative for rash.  Neurological: Negative for dizziness, weakness and headaches.  Hematological: Does not bruise/bleed easily.  Psychiatric/Behavioral: Positive for dysphoric mood. The patient is nervous/anxious.   All other systems reviewed and are negative.      Objective:   Physical Exam  Constitutional: She is oriented to person, place, and time. She appears well-developed and  well-nourished. No distress.  HENT:  Head: Normocephalic.  Nose: Nose normal.  Mouth/Throat: Oropharynx is clear and moist.  Eyes: Pupils are equal, round, and reactive to light. EOM are normal.  Neck: Normal range of motion. Neck supple. No JVD present. Carotid bruit is not present.  Cardiovascular: Normal rate, regular rhythm, normal heart sounds and intact distal pulses.  Pulmonary/Chest: Effort normal and breath sounds normal. No respiratory distress. She has no wheezes. She has no rales. She exhibits no tenderness.  Abdominal: Soft. Normal appearance, normal aorta and bowel sounds are normal. She exhibits no distension,  no abdominal bruit, no pulsatile midline mass and no mass. There is no splenomegaly or hepatomegaly. There is no tenderness.  Musculoskeletal: Normal range of motion. She exhibits no edema.  Lymphadenopathy:    She has no cervical adenopathy.  Neurological: She is alert and oriented to person, place, and time. She has normal reflexes.  Skin: Skin is warm and dry.  Wound edges to right arm are well approximated-  No erythema  Psychiatric: Judgment and thought content normal. Her mood appears anxious. She is withdrawn. She exhibits a depressed mood. She expresses no homicidal and no suicidal ideation.  Nursing note and vitals reviewed.  BP (!) 120/58   Pulse (!) 106   Temp 97.6 F (36.4 C) (Oral)   Ht '5\' 4"'$  (1.626 m)   Wt 202 lb 12.8 oz (92 kg)   BMI 34.81 kg/m   hgba1c 9.0      Assessment & Plan:  Alexandra Hunt comes in today with chief complaint of Medical Management of Chronic Issues   Diagnosis and orders addressed:  1. Essential hypertension, malignant Low sodium diet - CMP14+EGFR - lisinopril (PRINIVIL,ZESTRIL) 20 MG tablet; Take 1 tablet (20 mg total) by mouth daily.  Dispense: 90 tablet; Refill: 1 - furosemide (LASIX) 20 MG tablet; TAKE 1 TABLET BY MOUTH EVERY DAY  Dispense: 90 tablet; Refill: 1  2. Sleep apnea, unspecified type Needs new CPAP  machine  3. Type 2 diabetes mellitus with diabetic polyneuropathy, with long-term current use of insulin (HCC) Increased toujeo to 70u bid Keep diary of blood sugars - Bayer DCA Hb A1c Waived - Microalbumin / creatinine urine ratio - metFORMIN (GLUCOPHAGE) 500 MG tablet; Take 1 tablet (500 mg total) by mouth 2 (two) times daily with a meal.  Dispense: 180 tablet; Refill: 1 - Insulin Glargine (TOUJEO SOLOSTAR) 300 UNIT/ML SOPN; Inject 65 Units into the skin 2 (two) times daily.  Dispense: 5 pen; Refill: 5 - insulin regular (HUMULIN R) 100 units/mL injection; Sliding scale: less than 80 (6u): 80-120 (11u): 121-150 (12u): 151-200 (13u): 210-250 (14u): 251-300 (16u): over 300 (18u)  Dispense: 30 mL; Refill: 5  4. Osteopenia, unspecified location Weight bearing exercises  5. Low serum vitamin D Daily vitamin d   6. Mixed hyperlipidemia Low fat diet - Lipid panel - fenofibrate 160 MG tablet; Take 1 tablet (160 mg total) by mouth daily.  Dispense: 90 tablet; Refill: 1   7. GAD (generalized anxiety disorder) Stress management - LORazepam (ATIVAN) 1 MG tablet; Take 1 tablet (1 mg total) by mouth 3 (three) times daily.  Dispense: 90 tablet; Refill: 2  8. Recurrent major depressive disorder, in full remission ([place]) Increased zoloft to 2 po qd - sertraline (ZOLOFT) 100 MG tablet; Take 2 tablets (200 mg total) by mouth daily.  Dispense: 180 tablet; Refill: 1  9. BMI 32.0-32.9,adult Discussed diet and exercise for person with BMI >25 Will recheck weight in 3-6 months   Labs pending Health Maintenance reviewed Diet and exercise encouraged  Follow up plan: 3 months   Mary-Margaret Hassell Done, FNP

## 2017-11-10 LAB — CMP14+EGFR
A/G RATIO: 1.4 (ref 1.2–2.2)
ALK PHOS: 76 IU/L (ref 39–117)
ALT: 10 IU/L (ref 0–32)
AST: 16 IU/L (ref 0–40)
Albumin: 4.3 g/dL (ref 3.5–4.8)
BILIRUBIN TOTAL: 0.3 mg/dL (ref 0.0–1.2)
BUN/Creatinine Ratio: 21 (ref 12–28)
BUN: 25 mg/dL (ref 8–27)
CHLORIDE: 101 mmol/L (ref 96–106)
CO2: 20 mmol/L (ref 20–29)
Calcium: 9.7 mg/dL (ref 8.7–10.3)
Creatinine, Ser: 1.2 mg/dL — ABNORMAL HIGH (ref 0.57–1.00)
GFR calc Af Amer: 53 mL/min/{1.73_m2} — ABNORMAL LOW (ref >59)
GFR calc non Af Amer: 46 mL/min/{1.73_m2} — ABNORMAL LOW (ref >59)
Globulin, Total: 3.1 g/dL (ref 1.5–4.5)
Glucose: 271 mg/dL — ABNORMAL HIGH (ref 65–99)
POTASSIUM: 4.8 mmol/L (ref 3.5–5.2)
Sodium: 139 mmol/L (ref 134–144)
Total Protein: 7.4 g/dL (ref 6.0–8.5)

## 2017-11-10 LAB — LIPID PANEL
CHOLESTEROL TOTAL: 152 mg/dL (ref 100–199)
Chol/HDL Ratio: 5.2 ratio — ABNORMAL HIGH (ref 0.0–4.4)
HDL: 29 mg/dL — AB (ref >39)
LDL Calculated: 69 mg/dL (ref 0–99)
TRIGLYCERIDES: 270 mg/dL — AB (ref 0–149)
VLDL Cholesterol Cal: 54 mg/dL — ABNORMAL HIGH (ref 5–40)

## 2017-11-10 LAB — MICROALBUMIN / CREATININE URINE RATIO
Creatinine, Urine: 235.8 mg/dL
MICROALBUM., U, RANDOM: 67.1 ug/mL
Microalb/Creat Ratio: 28.5 mg/g creat (ref 0.0–30.0)

## 2017-11-14 ENCOUNTER — Other Ambulatory Visit: Payer: Self-pay | Admitting: Nurse Practitioner

## 2017-11-15 ENCOUNTER — Telehealth: Payer: Self-pay | Admitting: Nurse Practitioner

## 2017-11-17 ENCOUNTER — Other Ambulatory Visit: Payer: Self-pay | Admitting: Nurse Practitioner

## 2017-11-17 ENCOUNTER — Telehealth: Payer: Self-pay | Admitting: Nurse Practitioner

## 2017-11-17 ENCOUNTER — Other Ambulatory Visit: Payer: Self-pay

## 2017-11-17 DIAGNOSIS — Z794 Long term (current) use of insulin: Principal | ICD-10-CM

## 2017-11-17 DIAGNOSIS — E1142 Type 2 diabetes mellitus with diabetic polyneuropathy: Secondary | ICD-10-CM

## 2017-11-17 MED ORDER — TRUEPLUS LANCETS 33G MISC: 3 refills | Status: DC

## 2017-11-17 MED ORDER — "INSULIN SYRINGE-NEEDLE U-100 31G X 5/16"" 1 ML MISC": 2 refills | Status: DC

## 2017-11-17 MED ORDER — LANCET DEVICE MISC: 0 refills | Status: DC

## 2017-11-17 NOTE — Telephone Encounter (Signed)
Alexandra Hunt spoke with pt

## 2017-12-02 ENCOUNTER — Other Ambulatory Visit: Payer: Self-pay | Admitting: Nurse Practitioner

## 2017-12-07 DIAGNOSIS — D0461 Carcinoma in situ of skin of right upper limb, including shoulder: Secondary | ICD-10-CM | POA: Diagnosis not present

## 2017-12-12 ENCOUNTER — Other Ambulatory Visit: Payer: Self-pay | Admitting: Nurse Practitioner

## 2017-12-28 ENCOUNTER — Other Ambulatory Visit: Payer: Self-pay | Admitting: Nurse Practitioner

## 2017-12-29 ENCOUNTER — Other Ambulatory Visit: Payer: Self-pay | Admitting: Nurse Practitioner

## 2017-12-30 NOTE — Telephone Encounter (Signed)
Last Vit D 07/09/15  17.1 

## 2018-01-08 ENCOUNTER — Other Ambulatory Visit: Payer: Self-pay | Admitting: Pediatrics

## 2018-01-08 DIAGNOSIS — F3342 Major depressive disorder, recurrent, in full remission: Secondary | ICD-10-CM

## 2018-01-17 ENCOUNTER — Encounter: Payer: Self-pay | Admitting: Internal Medicine

## 2018-01-18 ENCOUNTER — Ambulatory Visit: Payer: Medicare HMO | Admitting: Family Medicine

## 2018-01-24 ENCOUNTER — Encounter: Payer: Self-pay | Admitting: Internal Medicine

## 2018-01-25 DIAGNOSIS — Z8542 Personal history of malignant neoplasm of other parts of uterus: Secondary | ICD-10-CM | POA: Diagnosis not present

## 2018-01-25 DIAGNOSIS — N760 Acute vaginitis: Secondary | ICD-10-CM | POA: Diagnosis not present

## 2018-01-25 DIAGNOSIS — Z803 Family history of malignant neoplasm of breast: Secondary | ICD-10-CM | POA: Diagnosis not present

## 2018-01-25 DIAGNOSIS — F4321 Adjustment disorder with depressed mood: Secondary | ICD-10-CM | POA: Diagnosis not present

## 2018-01-25 DIAGNOSIS — Z90710 Acquired absence of both cervix and uterus: Secondary | ICD-10-CM | POA: Diagnosis not present

## 2018-01-25 DIAGNOSIS — Z86010 Personal history of colonic polyps: Secondary | ICD-10-CM | POA: Diagnosis not present

## 2018-01-25 DIAGNOSIS — Z1272 Encounter for screening for malignant neoplasm of vagina: Secondary | ICD-10-CM | POA: Diagnosis not present

## 2018-01-25 DIAGNOSIS — Z124 Encounter for screening for malignant neoplasm of cervix: Secondary | ICD-10-CM | POA: Diagnosis not present

## 2018-01-27 ENCOUNTER — Ambulatory Visit (INDEPENDENT_AMBULATORY_CARE_PROVIDER_SITE_OTHER): Payer: Medicare HMO | Admitting: *Deleted

## 2018-01-27 DIAGNOSIS — Z23 Encounter for immunization: Secondary | ICD-10-CM | POA: Diagnosis not present

## 2018-01-30 ENCOUNTER — Other Ambulatory Visit: Payer: Self-pay | Admitting: Obstetrics & Gynecology

## 2018-01-30 DIAGNOSIS — Z1231 Encounter for screening mammogram for malignant neoplasm of breast: Secondary | ICD-10-CM

## 2018-02-01 ENCOUNTER — Encounter

## 2018-02-10 ENCOUNTER — Ambulatory Visit: Payer: Medicare HMO | Admitting: Nurse Practitioner

## 2018-02-13 DIAGNOSIS — E113391 Type 2 diabetes mellitus with moderate nonproliferative diabetic retinopathy without macular edema, right eye: Secondary | ICD-10-CM | POA: Diagnosis not present

## 2018-02-13 DIAGNOSIS — H35342 Macular cyst, hole, or pseudohole, left eye: Secondary | ICD-10-CM | POA: Diagnosis not present

## 2018-02-13 DIAGNOSIS — H35033 Hypertensive retinopathy, bilateral: Secondary | ICD-10-CM | POA: Diagnosis not present

## 2018-02-13 DIAGNOSIS — H40023 Open angle with borderline findings, high risk, bilateral: Secondary | ICD-10-CM | POA: Diagnosis not present

## 2018-02-15 ENCOUNTER — Encounter: Admitting: Internal Medicine

## 2018-02-21 DIAGNOSIS — L905 Scar conditions and fibrosis of skin: Secondary | ICD-10-CM | POA: Diagnosis not present

## 2018-02-21 DIAGNOSIS — R202 Paresthesia of skin: Secondary | ICD-10-CM | POA: Diagnosis not present

## 2018-02-21 DIAGNOSIS — G6289 Other specified polyneuropathies: Secondary | ICD-10-CM | POA: Diagnosis not present

## 2018-02-21 DIAGNOSIS — L821 Other seborrheic keratosis: Secondary | ICD-10-CM | POA: Diagnosis not present

## 2018-02-21 DIAGNOSIS — I7389 Other specified peripheral vascular diseases: Secondary | ICD-10-CM | POA: Diagnosis not present

## 2018-02-21 DIAGNOSIS — G629 Polyneuropathy, unspecified: Secondary | ICD-10-CM | POA: Diagnosis not present

## 2018-02-28 ENCOUNTER — Ambulatory Visit (AMBULATORY_SURGERY_CENTER): Payer: Self-pay

## 2018-02-28 VITALS — Ht 64.0 in | Wt 211.0 lb

## 2018-02-28 DIAGNOSIS — Z1211 Encounter for screening for malignant neoplasm of colon: Secondary | ICD-10-CM

## 2018-02-28 NOTE — Progress Notes (Signed)
Per pt, no allergies to soy or egg products.Pt not taking any weight loss meds or using  O2 at home.  Pt refused emmi video. 

## 2018-03-06 ENCOUNTER — Ambulatory Visit
Admission: RE | Admit: 2018-03-06 | Discharge: 2018-03-06 | Disposition: A | Discharge status: Home / Self Care | Source: Ambulatory Visit | Attending: Obstetrics & Gynecology | Admitting: Obstetrics & Gynecology

## 2018-03-06 DIAGNOSIS — Z1231 Encounter for screening mammogram for malignant neoplasm of breast: Secondary | ICD-10-CM | POA: Diagnosis not present

## 2018-03-07 ENCOUNTER — Other Ambulatory Visit: Payer: Self-pay | Admitting: Nurse Practitioner

## 2018-03-07 DIAGNOSIS — F411 Generalized anxiety disorder: Secondary | ICD-10-CM

## 2018-03-07 NOTE — Telephone Encounter (Signed)
Last seen 10/2017 = mmm

## 2018-03-08 ENCOUNTER — Other Ambulatory Visit: Payer: Self-pay | Admitting: Obstetrics & Gynecology

## 2018-03-08 DIAGNOSIS — R928 Other abnormal and inconclusive findings on diagnostic imaging of breast: Secondary | ICD-10-CM

## 2018-03-09 ENCOUNTER — Telehealth: Payer: Self-pay | Admitting: Internal Medicine

## 2018-03-09 NOTE — Telephone Encounter (Signed)
Pls call pt, she has some questions regarding medications the day of her procedure.

## 2018-03-09 NOTE — Telephone Encounter (Signed)
Pt states  After last sedation she was hard to wake, she was shaking and wouldn't wake easy --  Was with her hysterectomy  She questioned about what foods to hold the 5 days before, we discussed no high fiber foods, but ok for meats pastas rice bananas and cooked veggies   She asked if she could use Gatorade Zero to mix her prep- instructed yes but she needs to do some liquids with sugar the day before to help maintain her Blood Sugar - she has regular and sugar free jello which I told her was fine, she has apple and white grape juices and banana popsicles- pt instructed all are ok  She asked about her meds specifically her insulin- instructed long acting she is to  use half the night before so 45 units , not 70- no insulin the day of the procedure and no oral DM meds either the day of the procedure-  She can take all her other meds by 11 am   Encouraged to call with further questions  Lelan Pons

## 2018-03-14 ENCOUNTER — Encounter: Payer: Self-pay | Admitting: Internal Medicine

## 2018-03-14 ENCOUNTER — Ambulatory Visit (AMBULATORY_SURGERY_CENTER): Payer: Medicare HMO | Admitting: Internal Medicine

## 2018-03-14 VITALS — BP 143/63 | HR 76 | Temp 97.1°F | Resp 39 | Ht 64.0 in | Wt 211.0 lb

## 2018-03-14 DIAGNOSIS — Z1211 Encounter for screening for malignant neoplasm of colon: Secondary | ICD-10-CM | POA: Diagnosis not present

## 2018-03-14 DIAGNOSIS — D120 Benign neoplasm of cecum: Secondary | ICD-10-CM

## 2018-03-14 DIAGNOSIS — D123 Benign neoplasm of transverse colon: Secondary | ICD-10-CM

## 2018-03-14 MED ORDER — SODIUM CHLORIDE 0.9 % IV SOLN: 500.0000 mL | Freq: Once | INTRAVENOUS | Status: DC

## 2018-03-14 NOTE — Patient Instructions (Addendum)
   I found and removed 2 tiny polyps. I will let you know pathology results and when to have another routine colonoscopy by mail and/or My Chart.  I appreciate the opportunity to care for you. Gatha Mayer, MD, FACG  YOU HAD AN ENDOSCOPIC PROCEDURE TODAY AT [facility]:   Refer to the procedure report that was given to you for any specific questions about what was found during the examination.  If the procedure report does not answer your questions, please call your gastroenterologist to clarify.  If you requested that your care partner not be given the details of your procedure findings, then the procedure report has been included in a sealed envelope for you to review at your convenience later.  YOU SHOULD EXPECT: Some feelings of bloating in the abdomen. Passage of more gas than usual.  Walking can help get rid of the air that was put into your GI tract during the procedure and reduce the bloating. If you had a lower endoscopy (such as a colonoscopy or flexible sigmoidoscopy) you may notice spotting of blood in your stool or on the toilet paper. If you underwent a bowel prep for your procedure, you may not have a normal bowel movement for a few days.  Please Note:  You might notice some irritation and congestion in your nose or some drainage.  This is from the oxygen used during your procedure.  There is no need for concern and it should clear up in a day or so.  SYMPTOMS TO REPORT IMMEDIATELY:   Following lower endoscopy (colonoscopy or flexible sigmoidoscopy):  Excessive amounts of blood in the stool  Significant tenderness or worsening of abdominal pains  Swelling of the abdomen that is new, acute  Fever of 100F or higher   For urgent or emergent issues, a gastroenterologist can be reached at any hour by calling [phone].   DIET:  We do recommend a small meal at first, but then you may proceed to your regular diet.  Drink plenty of fluids but you should  avoid alcoholic beverages for 24 hours.  ACTIVITY:  You should plan to take it easy for the rest of today and you should NOT DRIVE or use heavy machinery until tomorrow (because of the sedation medicines used during the test).    FOLLOW UP: Our staff will call the number listed on your records the next business day following your procedure to check on you and address any questions or concerns that you may have regarding the information given to you following your procedure. If we do not reach you, we will leave a message.  However, if you are feeling well and you are not experiencing any problems, there is no need to return our call.  We will assume that you have returned to your regular daily activities without incident.  If any biopsies were taken you will be contacted by phone or by letter within the next 1-3 weeks.  Please call us at [phone] if you have not heard about the biopsies in 3 weeks.    SIGNATURES/CONFIDENTIALITY: You and/or your care partner have signed paperwork which will be entered into your electronic medical record.  These signatures attest to the fact that that the information above on your After Visit Summary has been reviewed and is understood.  Full responsibility of the confidentiality of this discharge information lies with you and/or your care-partner.

## 2018-03-14 NOTE — Op Note (Signed)
[place] Patient Name: Alexandra Hunt Procedure Date: 03/14/2018 1:34 PM MRN: [id number] Endoscopist: Gatha Mayer , MD Age: 73 Referring MD:  Date of Birth: 04-03-1945 Gender: Female Account #: [id number] Procedure:                Colonoscopy Indications:              Screening for colorectal malignant neoplasm Medicines:                Propofol per Anesthesia, Monitored Anesthesia Care Procedure:                Pre-Anesthesia Assessment:                           - Prior to the procedure, a History and Physical                            was performed, and patient medications and                            allergies were reviewed. The patient's tolerance of                            previous anesthesia was also reviewed. The risks                            and benefits of the procedure and the sedation                            options and risks were discussed with the patient.                            All questions were answered, and informed consent                            was obtained. Prior Anticoagulants: The patient has                            taken no previous anticoagulant or antiplatelet                            agents. ASA Grade Assessment: III - A patient with                            severe systemic disease. After reviewing the risks                            and benefits, the patient was deemed in                            satisfactory condition to undergo the procedure.                           After obtaining informed consent, the colonoscope  was passed under direct vision. Throughout the                            procedure, the patient's blood pressure, pulse, and                            oxygen saturations were monitored continuously. The                            Colonoscope was introduced through the anus and                            advanced to the the cecum, identified by   appendiceal orifice and ileocecal valve. The                            colonoscopy was performed without difficulty. The                            patient tolerated the procedure well. The quality                            of the bowel preparation was good. The ileocecal                            valve, appendiceal orifice, and rectum were                            photographed. The bowel preparation used was                            Miralax. Scope In: 1:52:53 PM Scope Out: 2:06:55 PM Scope Withdrawal Time: 0 hours 11 minutes 6 seconds  Total Procedure Duration: 0 hours 14 minutes 2 seconds  Findings:                 The perianal and digital rectal examinations were                            normal.                           Two sessile polyps were found in the transverse                            colon and cecum. The polyps were diminutive in                            size. These polyps were removed with a cold snare.                            Resection and retrieval were complete. Verification                            of patient identification for the specimen was  done. Estimated blood loss was minimal.                           Diverticula were found in the sigmoid colon.                           The exam was otherwise without abnormality on                            direct and retroflexion views. Complications:            No immediate complications. Estimated Blood Loss:     Estimated blood loss was minimal. Impression:               - Two diminutive polyps in the transverse colon and                            in the cecum, removed with a cold snare. Resected                            and retrieved.                           - Diverticulosis in the sigmoid colon.                           - The examination was otherwise normal on direct                            and retroflexion views. Recommendation:           - Patient has a contact  number available for                            emergencies. The signs and symptoms of potential                            delayed complications were discussed with the                            patient. Return to normal activities tomorrow.                            Written discharge instructions were provided to the                            patient.                           - Resume previous diet.                           - Continue present medications.                           - Repeat colonoscopy is recommended. The  colonoscopy date will be determined after pathology                            results from today's exam become available for                            review. Gatha Mayer, MD 03/14/2018 2:15:57 PM This report has been signed electronically.

## 2018-03-14 NOTE — Progress Notes (Signed)
Spontaneous respirations throughout. VSS. Resting comfortably. To PACU on room air. Report to  RN. 

## 2018-03-14 NOTE — Progress Notes (Signed)
Pt's states no medical or surgical changes since previsit or office visit.  Pt disagrees with the Schizoaffective disorder diagnosis as listed on her medical history.

## 2018-03-14 NOTE — Progress Notes (Signed)
Called to room to assist during endoscopic procedure.  Patient ID and intended procedure confirmed with present staff. Received instructions for my participation in the procedure from the performing physician.  

## 2018-03-15 ENCOUNTER — Telehealth: Payer: Self-pay

## 2018-03-15 NOTE — Telephone Encounter (Signed)
  Follow up Call-  Call back number 03/14/2018  Post procedure Call Back phone  # [phone]  Permission to leave phone message Yes  Some recent data might be hidden     Patient questions:  Do you have a fever, pain , or abdominal swelling? No. Pain Score  0 *  Have you tolerated food without any problems? Yes.    Have you been able to return to your normal activities? Yes.    Do you have any questions about your discharge instructions: Diet   No. Medications  No. Follow up visit  No.  Do you have questions or concerns about your Care? No.  Actions: * If pain score is 4 or above: No action needed, pain <4.

## 2018-03-19 ENCOUNTER — Other Ambulatory Visit: Payer: Self-pay | Admitting: Nurse Practitioner

## 2018-03-20 ENCOUNTER — Encounter: Payer: Self-pay | Admitting: Internal Medicine

## 2018-03-20 DIAGNOSIS — Z860101 Personal history of adenomatous and serrated colon polyps: Secondary | ICD-10-CM | POA: Insufficient documentation

## 2018-03-20 DIAGNOSIS — Z86010 Personal history of colonic polyps: Secondary | ICD-10-CM

## 2018-03-20 HISTORY — DX: Personal history of adenomatous and serrated colon polyps: Z86.0101

## 2018-03-20 HISTORY — DX: Personal history of colonic polyps: Z86.010

## 2018-03-20 NOTE — Telephone Encounter (Signed)
Last Vit D 07/09/15  17.1

## 2018-03-20 NOTE — Progress Notes (Signed)
2 diminutive adenomas Recall 2025 - consider repeating then at 17

## 2018-03-22 ENCOUNTER — Encounter

## 2018-03-22 ENCOUNTER — Other Ambulatory Visit

## 2018-03-27 ENCOUNTER — Other Ambulatory Visit: Payer: Self-pay | Admitting: Obstetrics & Gynecology

## 2018-03-27 ENCOUNTER — Ambulatory Visit
Admission: RE | Admit: 2018-03-27 | Discharge: 2018-03-27 | Disposition: A | Discharge status: Home / Self Care | Source: Ambulatory Visit | Attending: Obstetrics & Gynecology | Admitting: Obstetrics & Gynecology

## 2018-03-27 DIAGNOSIS — R928 Other abnormal and inconclusive findings on diagnostic imaging of breast: Secondary | ICD-10-CM

## 2018-03-27 DIAGNOSIS — N6489 Other specified disorders of breast: Secondary | ICD-10-CM

## 2018-03-31 ENCOUNTER — Encounter: Payer: Self-pay | Admitting: Nurse Practitioner

## 2018-03-31 DIAGNOSIS — H35372 Puckering of macula, left eye: Secondary | ICD-10-CM | POA: Diagnosis not present

## 2018-03-31 DIAGNOSIS — H35342 Macular cyst, hole, or pseudohole, left eye: Secondary | ICD-10-CM | POA: Diagnosis not present

## 2018-03-31 DIAGNOSIS — E113212 Type 2 diabetes mellitus with mild nonproliferative diabetic retinopathy with macular edema, left eye: Secondary | ICD-10-CM | POA: Diagnosis not present

## 2018-03-31 DIAGNOSIS — E113291 Type 2 diabetes mellitus with mild nonproliferative diabetic retinopathy without macular edema, right eye: Secondary | ICD-10-CM | POA: Diagnosis not present

## 2018-04-06 ENCOUNTER — Other Ambulatory Visit: Payer: Self-pay | Admitting: Nurse Practitioner

## 2018-04-06 DIAGNOSIS — I10 Essential (primary) hypertension: Secondary | ICD-10-CM

## 2018-04-18 DIAGNOSIS — L821 Other seborrheic keratosis: Secondary | ICD-10-CM | POA: Diagnosis not present

## 2018-04-18 DIAGNOSIS — G629 Polyneuropathy, unspecified: Secondary | ICD-10-CM | POA: Diagnosis not present

## 2018-04-18 DIAGNOSIS — L905 Scar conditions and fibrosis of skin: Secondary | ICD-10-CM | POA: Diagnosis not present

## 2018-04-18 DIAGNOSIS — R202 Paresthesia of skin: Secondary | ICD-10-CM | POA: Diagnosis not present

## 2018-04-28 ENCOUNTER — Other Ambulatory Visit: Payer: Self-pay | Admitting: Nurse Practitioner

## 2018-04-28 DIAGNOSIS — J0101 Acute recurrent maxillary sinusitis: Secondary | ICD-10-CM

## 2018-04-28 NOTE — Telephone Encounter (Signed)
Last seen 11/09/17

## 2018-05-03 ENCOUNTER — Telehealth: Payer: Self-pay | Admitting: Nurse Practitioner

## 2018-05-03 NOTE — Telephone Encounter (Signed)
Unable to leave message.  Sample of insulin here for her.

## 2018-05-05 ENCOUNTER — Encounter: Payer: Self-pay | Admitting: Nurse Practitioner

## 2018-05-05 ENCOUNTER — Ambulatory Visit (INDEPENDENT_AMBULATORY_CARE_PROVIDER_SITE_OTHER): Payer: Medicare HMO | Admitting: Nurse Practitioner

## 2018-05-05 VITALS — BP 128/63 | HR 88 | Temp 97.9°F | Ht 64.0 in | Wt 206.0 lb

## 2018-05-05 DIAGNOSIS — E782 Mixed hyperlipidemia: Secondary | ICD-10-CM | POA: Diagnosis not present

## 2018-05-05 DIAGNOSIS — I10 Essential (primary) hypertension: Secondary | ICD-10-CM | POA: Diagnosis not present

## 2018-05-05 DIAGNOSIS — F3342 Major depressive disorder, recurrent, in full remission: Secondary | ICD-10-CM | POA: Diagnosis not present

## 2018-05-05 DIAGNOSIS — F411 Generalized anxiety disorder: Secondary | ICD-10-CM | POA: Diagnosis not present

## 2018-05-05 DIAGNOSIS — E1142 Type 2 diabetes mellitus with diabetic polyneuropathy: Secondary | ICD-10-CM | POA: Diagnosis not present

## 2018-05-05 DIAGNOSIS — G4730 Sleep apnea, unspecified: Secondary | ICD-10-CM

## 2018-05-05 DIAGNOSIS — M8580 Other specified disorders of bone density and structure, unspecified site: Secondary | ICD-10-CM

## 2018-05-05 DIAGNOSIS — Z794 Long term (current) use of insulin: Secondary | ICD-10-CM

## 2018-05-05 LAB — BAYER DCA HB A1C WAIVED: HB A1C (BAYER DCA - WAIVED): 9.3 % — ABNORMAL HIGH (ref <7.0)

## 2018-05-05 MED ORDER — LISINOPRIL 20 MG PO TABS: 20.0000 mg | ORAL_TABLET | Freq: Every day | ORAL | 1 refills | Status: DC

## 2018-05-05 MED ORDER — MAGIC MOUTHWASH: 5.0000 mL | Freq: Four times a day (QID) | ORAL | 0 refills | Status: DC

## 2018-05-05 MED ORDER — METFORMIN HCL 500 MG PO TABS: 500.0000 mg | ORAL_TABLET | Freq: Two times a day (BID) | ORAL | 1 refills | Status: DC

## 2018-05-05 MED ORDER — SERTRALINE HCL 100 MG PO TABS: 200.0000 mg | ORAL_TABLET | Freq: Every day | ORAL | 1 refills | Status: DC

## 2018-05-05 MED ORDER — LORAZEPAM 1 MG PO TABS: 1.0000 mg | ORAL_TABLET | Freq: Three times a day (TID) | ORAL | 2 refills | Status: DC

## 2018-05-05 MED ORDER — FENOFIBRATE 160 MG PO TABS: 160.0000 mg | ORAL_TABLET | Freq: Every day | ORAL | 1 refills | Status: DC

## 2018-05-05 MED ORDER — FUROSEMIDE 20 MG PO TABS: ORAL_TABLET | ORAL | 1 refills | Status: DC
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
## 2018-05-05 NOTE — Patient Instructions (Signed)

## 2018-05-06 LAB — CMP14+EGFR
ALK PHOS: 76 IU/L (ref 39–117)
ALT: 7 IU/L (ref 0–32)
AST: 10 IU/L (ref 0–40)
Albumin/Globulin Ratio: 1.3 (ref 1.2–2.2)
Albumin: 3.8 g/dL (ref 3.8–4.8)
BUN/Creatinine Ratio: 13 (ref 12–28)
BUN: 16 mg/dL (ref 8–27)
Bilirubin Total: <0.2 mg/dL (ref 0.0–1.2)
CO2: 22 mmol/L (ref 20–29)
Calcium: 9.5 mg/dL (ref 8.7–10.3)
Chloride: 103 mmol/L (ref 96–106)
Creatinine, Ser: 1.22 mg/dL — ABNORMAL HIGH (ref 0.57–1.00)
GFR calc Af Amer: 52 mL/min/{1.73_m2} — ABNORMAL LOW (ref >59)
GFR calc non Af Amer: 45 mL/min/{1.73_m2} — ABNORMAL LOW (ref >59)
Globulin, Total: 2.9 g/dL (ref 1.5–4.5)
Glucose: 243 mg/dL — ABNORMAL HIGH (ref 65–99)
POTASSIUM: 4.3 mmol/L (ref 3.5–5.2)
Sodium: 140 mmol/L (ref 134–144)
Total Protein: 6.7 g/dL (ref 6.0–8.5)

## 2018-05-06 LAB — LIPID PANEL
Chol/HDL Ratio: 5 ratio — ABNORMAL HIGH (ref 0.0–4.4)
Cholesterol, Total: 141 mg/dL (ref 100–199)
HDL: 28 mg/dL — ABNORMAL LOW (ref >39)
LDL Calculated: 67 mg/dL (ref 0–99)
Triglycerides: 231 mg/dL — ABNORMAL HIGH (ref 0–149)
VLDL Cholesterol Cal: 46 mg/dL — ABNORMAL HIGH (ref 5–40)

## 2018-05-08 NOTE — Telephone Encounter (Signed)
Aware.  New script with directions of 75 units BID will be scanned for the refill through patient assistance at [place].

## 2018-05-11 DIAGNOSIS — R05 Cough: Secondary | ICD-10-CM | POA: Diagnosis not present

## 2018-05-11 DIAGNOSIS — B370 Candidal stomatitis: Secondary | ICD-10-CM | POA: Diagnosis not present

## 2018-05-11 DIAGNOSIS — B373 Candidiasis of vulva and vagina: Secondary | ICD-10-CM | POA: Diagnosis not present

## 2018-05-11 DIAGNOSIS — J0100 Acute maxillary sinusitis, unspecified: Secondary | ICD-10-CM | POA: Diagnosis not present

## 2018-06-20 ENCOUNTER — Telehealth: Payer: Self-pay | Admitting: Nurse Practitioner

## 2018-07-03 ENCOUNTER — Ambulatory Visit (INDEPENDENT_AMBULATORY_CARE_PROVIDER_SITE_OTHER): Payer: Medicare HMO | Admitting: *Deleted

## 2018-07-03 ENCOUNTER — Encounter: Payer: Self-pay | Admitting: *Deleted

## 2018-07-03 ENCOUNTER — Other Ambulatory Visit: Payer: Self-pay

## 2018-07-03 DIAGNOSIS — Z124 Encounter for screening for malignant neoplasm of cervix: Secondary | ICD-10-CM | POA: Diagnosis not present

## 2018-07-03 DIAGNOSIS — F339 Major depressive disorder, recurrent, unspecified: Secondary | ICD-10-CM

## 2018-07-03 DIAGNOSIS — F411 Generalized anxiety disorder: Secondary | ICD-10-CM

## 2018-07-03 DIAGNOSIS — Z0000 Encounter for general adult medical examination without abnormal findings: Secondary | ICD-10-CM

## 2018-07-03 NOTE — Progress Notes (Signed)
[place] VISIT  07/03/2018  Telephone Visit Disclaimer This Medicare AWV was conducted by telephone due to national recommendations for restrictions regarding the COVID-19 Pandemic (e.g. social distancing).  I verified, using two identifiers, that I am speaking with Alexandra Hunt or their authorized healthcare agent. I discussed the limitations, risks, security, and privacy concerns of performing an evaluation and management service by telephone and the potential availability of an in-person appointment in the future. The patient expressed understanding and agreed to proceed.   Subjective:  Alexandra Hunt is a 73 y.o. female patient of Chevis Pretty, [place] who had a Medicare Annual Wellness Visit today via telephone. Alexandra Hunt is Retired and several members of her family live with her.  she has 5 children, 10 grandchildren, and 2 great grandchildren. she reports that she is socially active and does interact with friends/family regularly. she is minimally physically active and enjoys taking care of her dog and spending time with her great grandchildren.  Patient reports that she is under a significant amount of stress because her adult son, daughter, daughter's boyfriend, and granddaughter all live with her.  She states that none of them have jobs, and she feels the responsibility of supporting the family.  She states that she does worry about having enough food, but has never run out completely.  She also states that she is feeling increased depression, denies any suicidal ideation.  Offered patient a referral to CCM social work- Psychologist, occupational for counseling and any additional community support that may be available for patient.  She is interested in talking with CCM- referral entered.   Patient Care Team: Chevis Pretty, FNP as PCP - General (Family Medicine) Maisie Fus, MD as Consulting Physician (Obstetrics and Gynecology) Michael Boston, MD as Consulting Physician  (General Surgery) Gatha Mayer, MD as Consulting Physician (Gastroenterology)  Advanced Directives 07/03/2018 03/14/2015 12/20/2014  Does Patient Have a Medical Advance Directive? No No No  Would patient like information on creating a medical advance directive? Yes (MAU/Ambulatory/Procedural Areas - Information given) Yes - Educational materials given Yes - Educational materials given    Hospital Utilization Over the Past 12 Months: # of hospitalizations or ER visits: 0 # of surgeries: 0  Review of Systems    Patient reports that her overall health is worse compared to last year because she has increased depression   Review of Systems:  Positive for headache  All other systems negative.  Pain Assessment Pain Score: 8      Current Medications & Allergies (verified) Allergies as of 07/03/2018      Reactions   Penicillins    SOB,rash   Ivp Dye [iodinated Diagnostic Agents]    Burning at IV site   Codeine    nauseated   Latex    rash   Sulfa Antibiotics    nauseated      Medication List       Accurate as of Jul 03, 2018  4:52 PM. Always use your most recent med list.        aspirin 81 MG tablet Take 81 mg by mouth daily.   B-D SINGLE USE SWABS REGULAR Pads USE  UP  TO FOUR TIMES DAILY  WHILE  CHECKING BLOOD SUGAR   diclofenac 75 MG EC tablet Commonly known as:  VOLTAREN TAKE 1 TABLET BY MOUTH TWICE A DAY AS NEEDED   dorzolamide 2 % ophthalmic solution Commonly known as:  TRUSOPT Place 2 drops into both eyes  2 (two) times daily.   fenofibrate 160 MG tablet Take 1 tablet (160 mg total) by mouth daily.   furosemide 20 MG tablet Commonly known as:  LASIX TAKE 1 TABLET BY MOUTH EVERY DAY   ibuprofen 800 MG tablet Commonly known as:  ADVIL TAKE 1 TABLET BY MOUTH EVERY 8 HOURS AS NEEDED   Insulin Glargine 300 UNIT/ML Sopn Commonly known as:  Toujeo SoloStar Inject 70 Units into the skin 2 (two) times daily.   Insulin Pen Needle 32G X 4 MM Misc Commonly  known as:  BD Pen Needle Nano U/F Use up to 6 times daily with toujeo and humalog.   B-D UF III MINI PEN NEEDLES 31G X 5 MM Misc Generic drug:  Insulin Pen Needle USE WITH SLIDING SCALE HUMALOG 4 TIMES DAILY   insulin regular 100 units/mL injection Commonly known as:  NovoLIN R INJECT 22UNITS 4 TIMES A DAY PER SLIDING SCALE   Insulin Syringe-Needle U-100 31G X 5/16" 1 ML Misc Commonly known as:  B-D INS SYR ULTRAFINE 1CC/31G USE DAILY WITH LANTUS AND HUMALOG INSULIN SLIDING SCALES AS INSTRUCTED   Lancet Device Misc USE TO CHECK BLOOD SUGAR TWICE DAILY AND AS NEEDED  E11.9   lisinopril 20 MG tablet Commonly known as:  ZESTRIL Take 1 tablet (20 mg total) by mouth daily.   LORazepam 1 MG tablet Commonly known as:  ATIVAN Take 1 tablet (1 mg total) by mouth 3 (three) times daily.   magic mouthwash Soln Take 5 mLs by mouth 4 (four) times daily.   metFORMIN 500 MG tablet Commonly known as:  GLUCOPHAGE Take 1 tablet (500 mg total) by mouth 2 (two) times daily with a meal.   mupirocin cream 2 % Commonly known as:  Bactroban Apply 1 application topically 2 (two) times daily.   ondansetron 4 MG tablet Commonly known as:  ZOFRAN   sertraline 100 MG tablet Commonly known as:  ZOLOFT Take 2 tablets (200 mg total) by mouth at bedtime.   silver sulfADIAZINE 1 % cream Commonly known as:  SILVADENE Apply 1 application topically 2 (two) times daily.   True Metrix Blood Glucose Test test strip Generic drug:  glucose blood TEST BLOOD GLUCOSE UP TO FOUR TIMES DAILY   TRUEplus Lancets 33G Misc Check blood sugar up to 4 times daily   Vitamin D (Ergocalciferol) 1.25 MG (50000 UT) Caps capsule Commonly known as:  DRISDOL TAKE 1 CAPSULE (50,000 UNITS TOTAL) BY MOUTH EVERY 7 (SEVEN) DAYS.   Vitamin D3 125 MCG (5000 UT) Caps ergocalciferol (vitamin D2) 50,000 unit capsule  TAKE 1 CAPSULE (50,000 UNITS TOTAL) BY MOUTH EVERY 7 (SEVEN) DAYS.       History (reviewed): Past Medical  History:  Diagnosis Date  . Benign breast lumps   . Cancer [facility]) 2007   uterine  . Cervical radiculopathy   . Cervical radiculopathy   . Change in bowel habits    soft stools  . Depression   . Diabetes mellitus without complication ([place])    type 2 diabetic  . History of anal fissures   . Hx of adenomatous colonic polyps 03/20/2018  . Hx of gallstones   . Hyperlipidemia   . Hypertension   . 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 Skin cancer    on arms/right hand  . Sleep apnea    Past Surgical History:  Procedure Laterality  Date  . ABDOMINAL HYSTERECTOMY     due to uterine cancer  . BREAST SURGERY     left breast biopsy/benign  . TUBAL LIGATION     Family History  Problem Relation Age of Onset  . Diabetes Mother   . Blindness Mother        related to diabetes  . Stroke Mother   . Heart disease Father        MI  . Heart attack Father   . Heart defect Father   . Breast cancer Sister   . Diabetes Brother        diet controlled  . Colon cancer Neg Hx   . Esophageal cancer Neg Hx   . Rectal cancer Neg Hx   . Stomach cancer Neg Hx    Social History   Socioeconomic History  . Marital status: Widowed    Spouse name: Not on file  . Number of children: Not on file  . Years of education: Not on file  . Highest education level: Not on file  Occupational History  . Not on file  Social Needs  . Financial resource strain: Hard  . Food insecurity:    Worry: Sometimes true    Inability: Never true  . Transportation needs:    Medical: No    Non-medical: No  Tobacco Use  . Smoking status: Never Smoker  . Smokeless tobacco: Never Used  Substance and Sexual Activity  . Alcohol use: No  . Drug use: No  . Sexual activity: Not on file  Lifestyle  . Physical activity:    Days per week: 0 days    Minutes per session: 0 min  . Stress: Very much  Relationships  .  Social connections:    Talks on phone: More than three times a week    Gets together: More than three times a week    Attends religious service: Never    Active member of club or organization: No    Attends meetings of clubs or organizations: Never    Relationship status: Widowed  Other Topics Concern  . Not on file  Social History Narrative  . Not on file    Activities of Daily Living In your present state of health, do you have any difficulty performing the following activities: 07/03/2018  Hearing? N  Vision? Y  Comment Waiting for cataract surgery once COVID 19 restrictions are lifted  Difficulty concentrating or making decisions? N  Walking or climbing stairs? Y  Comment Due to knee pain  Dressing or bathing? N  Doing errands, shopping? Y  Comment Daughter or son provides Copywriter, advertising and eating ? N  Using the Toilet? N  In the past six months, have you accidently leaked urine? Y  Comment Wears incontinence pad  Do you have problems with loss of bowel control? Y  Comment With eating certain foods, sometimes does not make it to bathroom  Managing your Medications? N  Managing your Finances? N  Housekeeping or managing your Housekeeping? Y  Comment daughter helps  Some recent data might be hidden    Patient Literacy    Exercise Current Exercise Habits: The patient does not participate in regular exercise at present, Exercise limited by: orthopedic condition(s);psychological condition(s)  Diet Patient reports consuming 2 meals a day and 0 snack(s) a day Patient reports that her primary diet is: Regular Patient reports that she worries that she will run out of food, but never has. - Provided local food  pantry list   Depression Screen PHQ 2/9 Scores 07/03/2018 11/09/2017 12/27/2016 07/29/2016 03/26/2016 01/10/2016 12/02/2015  PHQ - 2 Score 4 6 0 4 0 0 0  PHQ- 9 Score 10 14 - 11 - - -     Fall Risk Fall Risk  07/03/2018 05/05/2018 11/09/2017 12/27/2016  07/29/2016  Falls in the past year? 1 0 No No No  Number falls in past yr: 0 - - - -  Injury with Fall? 0 - - - -  Follow up Education provided;Falls prevention discussed - - - -     Objective:  Alexandra Hunt seemed alert and oriented and she participated appropriately during our telephone visit.  Blood Pressure Weight BMI  BP Readings from Last 3 Encounters:  05/05/18 128/63  03/14/18 (!) 143/63  11/09/17 (!) 120/58   Wt Readings from Last 3 Encounters:  05/05/18 206 lb (93.4 kg)  03/14/18 211 lb (95.7 kg)  02/28/18 211 lb (95.7 kg)   BMI Readings from Last 1 Encounters:  05/05/18 35.36 kg/m    *Unable to obtain current vital signs, weight, and BMI due to telephone visit type  Hearing/Vision  . Ziasia did not seem to have difficulty with hearing/understanding during the telephone conversation . Reports that she has had a formal eye exam by an eye care professional within the past year, we do not have record.  Will call Dr. Payton Emerald office to request report. . Reports that she has not had a formal hearing evaluation within the past year *Unable to fully assess hearing and vision during telephone visit type  Cognitive Function: 6CIT Screen 07/03/2018  What Year? 0 points  What month? 0 points  What time? 0 points  Count back from 20 0 points  Months in reverse 2 points  Repeat phrase 0 points  Total Score 2    Normal Cognitive Function Screening: Yes (Normal:0-7, Significant for Dysfunction: >8)  Immunization & Health Maintenance Record Immunization History  Administered Date(s) Administered  . Influenza, High Dose Seasonal PF 12/27/2016, 01/27/2018  . Influenza,inj,Quad PF,6+ Mos 02/07/2013, 12/20/2014, 12/02/2015  . Influenza,inj,quad, With Preservative 12/30/2016  . Influenza-Unspecified 05/30/2014  . Pneumococcal Conjugate-13 12/20/2014  . Pneumococcal Polysaccharide-23 09/29/2012    Health Maintenance  Topic Date Due  . OPHTHALMOLOGY EXAM  05/25/2017  . DEXA  SCAN  11/10/2018 (Originally 05/05/2017)  . TETANUS/TDAP  11/10/2018 (Originally 08/24/1966)  . LIPID PANEL  08/05/2018  . HEMOGLOBIN A1C  08/05/2018  . INFLUENZA VACCINE  09/30/2018  . FOOT EXAM  05/05/2019  . MAMMOGRAM  03/06/2020  . COLONOSCOPY  03/15/2023  . Hepatitis C Screening  Completed  . PNA vac Low Risk Adult  Completed   Had eye exam in January.  Called Dr. Payton Emerald office to request records. Recommend Tdap and dexa scan at next visit with Chevis Pretty, [place]. Discussed Shingrix with patient- recommend this series in the future.      Assessment  This is a routine wellness examination for Alexandra Hunt.  Health Maintenance: Due or Overdue Health Maintenance Due  Topic Date Due  . OPHTHALMOLOGY EXAM  05/25/2017     Alexandra Hunt needs a referral for Community Assistance: Care Management:   no Social Work:    yes Prescription Assistance:  no Nutrition/Diabetes Education:  no   Plan:  Personalized Goals Goals Addressed            This Visit's Progress   . Exercise 3x per week (30 min per time)  Walking or riding stationary bicycle are great options.      Personalized Health Maintenance & Screening Recommendations  Advanced directives: has NO advanced directive  - add't info requested. Referral to SW: yes  Lung Cancer Screening Recommended: no (Low Dose CT Chest recommended if Age 71-80 years, 30 pack-year currently smoking OR have quit w/in past 15 years) Hepatitis C Screening recommended: completed 02/20/2015   Advanced Directives: Written information was prepared per patient's request.  Mailed to patient.  Referrals & Orders Orders Placed This Encounter  Procedures  . Ambulatory referral to Chronic Care Management Services    Follow-up Plan . Follow-up with Chevis Pretty, FNP as planned  . Talk with Theadore Nan, LCSW regarding enrolling in CCM for counseling services.    I have personally reviewed and noted the following in  the patient's chart:   . Medical and social history . Use of alcohol, tobacco or illicit drugs  . Current medications and supplements . Functional ability and status . Nutritional status . Physical activity . Advanced directives . List of other physicians . Hospitalizations, surgeries, and ER visits in previous 12 months . Vitals . Screenings to include cognitive, depression, and falls . Referrals and appointments  In addition, I have reviewed and discussed with Alexandra Hunt certain preventive protocols, quality metrics, and best practice recommendations. A written personalized care plan for preventive services as well as general preventive health recommendations is available and can be mailed to the patient at her request.     Nolberto Hanlon, RN 07/03/2018

## 2018-07-04 NOTE — Patient Instructions (Signed)
  Ms. Stanek , Thank you for taking time to talk with me for your Medicare Wellness Visit. I appreciate your ongoing commitment to your health goals. Please review the following plan we discussed and let me know if I can assist you in the future.   You have been referred to our Chronic Case Management program for counseling with Theadore Nan, LCSW.  He will be in touch with you in within the next week.  These are the goals we discussed: Goals    . Exercise 3x per week (30 min per time)     Walking or riding stationary bicycle are great options.       This is a list of the screening recommended for you and due dates:  Health Maintenance  Topic Date Due      . DEXA scan (bone density measurement)  11/10/2018*  . Tetanus Vaccine  11/10/2018*  . Lipid (cholesterol) test  08/05/2018  . Hemoglobin A1C  08/05/2018  . Flu Shot  09/30/2018  . Complete foot exam   05/05/2019  . Mammogram  03/06/2020  . Colon Cancer Screening  03/15/2023  .  Hepatitis C: One time screening is recommended by Center for Disease Control  (CDC) for  adults born from [birth year] through 1963.   Completed  . Pneumonia vaccines  Completed  *Topic was postponed. The date shown is not the original due date.

## 2018-07-13 ENCOUNTER — Other Ambulatory Visit: Payer: Self-pay | Admitting: Nurse Practitioner

## 2018-07-26 ENCOUNTER — Other Ambulatory Visit: Payer: Self-pay | Admitting: Family Medicine

## 2018-07-27 ENCOUNTER — Telehealth: Payer: Self-pay | Admitting: Nurse Practitioner

## 2018-07-27 NOTE — Telephone Encounter (Signed)
Aware.  Dr. Warrick Parisian will be asked to sign form.

## 2018-07-31 ENCOUNTER — Other Ambulatory Visit: Payer: Self-pay | Admitting: Nurse Practitioner

## 2018-07-31 DIAGNOSIS — F3342 Major depressive disorder, recurrent, in full remission: Secondary | ICD-10-CM

## 2018-08-04 ENCOUNTER — Telehealth: Payer: Self-pay | Admitting: Nurse Practitioner

## 2018-08-04 ENCOUNTER — Other Ambulatory Visit: Payer: Self-pay

## 2018-08-04 NOTE — Telephone Encounter (Signed)
Pt is needing a refill on her flonase and albuterol that the urgent care in [place] filled, pt states that she had let a nurse know who did her AWV but [facility] in [place] doesn't have them.

## 2018-08-04 NOTE — Telephone Encounter (Signed)
Does pt ntbs for flonase rx and albuterol? Looks like she saw [facility] at Urgent care 05/11/18. Please advise.

## 2018-08-05 MED ORDER — FLUTICASONE PROPIONATE 50 MCG/ACT NA SUSP: 2.0000 | Freq: Every day | NASAL | 6 refills | Status: DC

## 2018-08-05 NOTE — Telephone Encounter (Signed)
Sent in flonase prescription. According to chart has not ad albuterol inhaler. Will need to be seen for that

## 2018-08-07 ENCOUNTER — Ambulatory Visit (INDEPENDENT_AMBULATORY_CARE_PROVIDER_SITE_OTHER): Payer: Medicare HMO | Admitting: Nurse Practitioner

## 2018-08-07 ENCOUNTER — Other Ambulatory Visit: Payer: Self-pay

## 2018-08-07 ENCOUNTER — Encounter: Payer: Self-pay | Admitting: Nurse Practitioner

## 2018-08-07 DIAGNOSIS — M8580 Other specified disorders of bone density and structure, unspecified site: Secondary | ICD-10-CM

## 2018-08-07 DIAGNOSIS — E782 Mixed hyperlipidemia: Secondary | ICD-10-CM | POA: Diagnosis not present

## 2018-08-07 DIAGNOSIS — F411 Generalized anxiety disorder: Secondary | ICD-10-CM | POA: Diagnosis not present

## 2018-08-07 DIAGNOSIS — F339 Major depressive disorder, recurrent, unspecified: Secondary | ICD-10-CM

## 2018-08-07 DIAGNOSIS — E1142 Type 2 diabetes mellitus with diabetic polyneuropathy: Secondary | ICD-10-CM | POA: Diagnosis not present

## 2018-08-07 DIAGNOSIS — R7989 Other specified abnormal findings of blood chemistry: Secondary | ICD-10-CM

## 2018-08-07 DIAGNOSIS — G4730 Sleep apnea, unspecified: Secondary | ICD-10-CM

## 2018-08-07 DIAGNOSIS — Z794 Long term (current) use of insulin: Secondary | ICD-10-CM

## 2018-08-07 DIAGNOSIS — I10 Essential (primary) hypertension: Secondary | ICD-10-CM | POA: Diagnosis not present

## 2018-08-07 DIAGNOSIS — Z6832 Body mass index (BMI) 32.0-32.9, adult: Secondary | ICD-10-CM | POA: Diagnosis not present

## 2018-08-07 MED ORDER — FUROSEMIDE 20 MG PO TABS: ORAL_TABLET | ORAL | 1 refills | Status: DC

## 2018-08-07 MED ORDER — LORAZEPAM 1 MG PO TABS: 1.0000 mg | ORAL_TABLET | Freq: Three times a day (TID) | ORAL | 2 refills | Status: DC

## 2018-08-07 MED ORDER — LISINOPRIL 20 MG PO TABS: 20.0000 mg | ORAL_TABLET | Freq: Every day | ORAL | 1 refills | Status: DC

## 2018-08-07 MED ORDER — FENOFIBRATE 160 MG PO TABS: 160.0000 mg | ORAL_TABLET | Freq: Every day | ORAL | 1 refills | Status: DC

## 2018-08-07 MED ORDER — SERTRALINE HCL 100 MG PO TABS: 200.0000 mg | ORAL_TABLET | Freq: Every day | ORAL | 1 refills | Status: DC

## 2018-08-07 MED ORDER — "INSULIN SYRINGE-NEEDLE U-100 31G X 5/16"" 1 ML MISC": 2 refills | Status: DC

## 2018-08-07 MED ORDER — INSULIN GLARGINE (1 UNIT DIAL) 300 UNIT/ML ~~LOC~~ SOPN: 80.0000 [IU] | PEN_INJECTOR | Freq: Two times a day (BID) | SUBCUTANEOUS | 5 refills | Status: DC

## 2018-08-07 MED ORDER — INSULIN REGULAR HUMAN 100 UNIT/ML IJ SOLN: INTRAMUSCULAR | 2 refills | Status: DC

## 2018-08-07 MED ORDER — METFORMIN HCL 500 MG PO TABS: 500.0000 mg | ORAL_TABLET | Freq: Two times a day (BID) | ORAL | 1 refills | Status: DC

## 2018-08-07 NOTE — Progress Notes (Signed)
Virtual Visit via telephone Note  I connected with Alexandra Hunt on 08/07/18 at 3:25 by telephone and verified that I am speaking with the correct person using two identifiers. Alexandra Hunt is currently located at home and no one is currently with her during visit. The provider, Mary-Margaret Hassell Done, FNP is located in their office at time of visit.  I discussed the limitations, risks, security and privacy concerns of performing an evaluation and management service by telephone and the availability of in person appointments. I also discussed with the patient that there may be a patient responsible charge related to this service. The patient expressed understanding and agreed to proceed.   History and Present Illness:   Chief Complaint: Medical Management of Chronic Issues    HPI:  1. Essential hypertension, malignant No c/o chest pain, sob or headache. Does not check blood pressure at home. BP Readings from Last 3 Encounters:  05/05/18 128/63  03/14/18 (!) 143/63  11/09/17 (!) 120/58     2. Type 2 diabetes mellitus with diabetic polyneuropathy, with long-term current use of insulin (HCC) Last HGBA1C 9.3%. we increased her toujeo to75u BID. She says that her blood sugars have been all over the place. She is on sliding scale 3x a day and she is having to give herself 16-20u. She says her blood sugar will go as high as 500.  3. Mixed hyperlipidemia Does ot watch diet and does very little exercise. Has tried keto diet but cannot stick to it.  4. Episode of recurrent major depressive disorder, unspecified depression episode severity ([place]) Is on zoloft daily and says she is doing okay. She is still having a hard time with the death of her husband.  5. GAD (generalized anxiety disorder) Is on ativan 1mg  TID- helps her relax  6. Low serum vitamin D takes vitamin d when she remembers  7. Sleep apnea, unspecified type Not using CPAP because her broke- she will have to have another  sleep studty to get machine. She sya sshe does not want to do right now.  8. Osteopenia, unspecified location Last dexascan was years ago- patient refuses to repeat  9. BMI 32.0-32.9,adult No recent weight changes    Outpatient Encounter Medications as of 08/07/2018  Medication Sig  . Alcohol Swabs (B-D SINGLE USE SWABS REGULAR) PADS Use up to 4 times daily to check blood sugars Dx E11.9  . aspirin 81 MG tablet Take 81 mg by mouth daily.  . Cholecalciferol (VITAMIN D3) 5000 units CAPS ergocalciferol (vitamin D2) 50,000 unit capsule  TAKE 1 CAPSULE (50,000 UNITS TOTAL) BY MOUTH EVERY 7 (SEVEN) DAYS.  Marland Kitchen diclofenac (VOLTAREN) 75 MG EC tablet TAKE 1 TABLET BY MOUTH TWICE A DAY AS NEEDED  . dorzolamide (TRUSOPT) 2 % ophthalmic solution Place 2 drops into both eyes 2 (two) times daily.  . fenofibrate 160 MG tablet Take 1 tablet (160 mg total) by mouth daily.  . fluticasone (FLONASE) 50 MCG/ACT nasal spray Place 2 sprays into both nostrils daily.  . furosemide (LASIX) 20 MG tablet TAKE 1 TABLET BY MOUTH EVERY DAY  . ibuprofen (ADVIL,MOTRIN) 800 MG tablet TAKE 1 TABLET BY MOUTH EVERY 8 HOURS AS NEEDED  . Insulin Glargine (TOUJEO SOLOSTAR) 300 UNIT/ML SOPN Inject 70 Units into the skin 2 (two) times daily.  . Insulin Pen Needle (B-D UF III MINI PEN NEEDLES) 31G X 5 MM MISC USE WITH SLIDING SCALE HUMALOG 4 TIMES DAILY DX: E11.9  . insulin regular (NOVOLIN R) 100 units/mL  injection INJECT 22UNITS 4 TIMES A DAY PER SLIDING SCALE  . Insulin Syringe-Needle U-100 (B-D INS SYR ULTRAFINE 1CC/31G) 31G X 5/16" 1 ML MISC USE DAILY WITH LANTUS AND HUMALOG INSULIN SLIDING SCALES AS INSTRUCTED  . Lancet Device MISC USE TO CHECK BLOOD SUGAR TWICE DAILY AND AS NEEDED  E11.9  . lisinopril (PRINIVIL,ZESTRIL) 20 MG tablet Take 1 tablet (20 mg total) by mouth daily.  Marland Kitchen LORazepam (ATIVAN) 1 MG tablet Take 1 tablet (1 mg total) by mouth 3 (three) times daily.  . magic mouthwash SOLN Take 5 mLs by mouth 4 (four) times  daily.  . metFORMIN (GLUCOPHAGE) 500 MG tablet Take 1 tablet (500 mg total) by mouth 2 (two) times daily with a meal.  . mupirocin cream (BACTROBAN) 2 % Apply 1 application topically 2 (two) times daily.  . ondansetron (ZOFRAN) 4 MG tablet   . sertraline (ZOLOFT) 100 MG tablet TAKE 2 TABLETS BY MOUTH EVERY DAY  . silver sulfADIAZINE (SILVADENE) 1 % cream Apply 1 application topically 2 (two) times daily.  . TRUE METRIX BLOOD GLUCOSE TEST test strip TEST BLOOD GLUCOSE UP TO FOUR TIMES DAILY  . TRUEPLUS LANCETS 33G MISC Check blood sugar up to 4 times daily  . Vitamin D, Ergocalciferol, (DRISDOL) 1.25 MG (50000 UT) CAPS capsule TAKE 1 CAPSULE (50,000 UNITS TOTAL) BY MOUTH EVERY 7 (SEVEN) DAYS.     Past Surgical History:  Procedure Laterality Date  . ABDOMINAL HYSTERECTOMY     due to uterine cancer  . BREAST SURGERY     left breast biopsy/benign  . TUBAL LIGATION      Family History  Problem Relation Age of Onset  . Diabetes Mother   . Blindness Mother        related to diabetes  . Stroke Mother   . Heart disease Father        MI  . Heart attack Father   . Heart defect Father   . Breast cancer Sister   . Diabetes Brother        diet controlled  . Colon cancer Neg Hx   . Esophageal cancer Neg Hx   . Rectal cancer Neg Hx   . Stomach cancer Neg Hx     New complaints: None  today  Social history:  patient has children and grandchildren living with her that are creating a lot of issues for her.    Review of Systems  Constitutional: Negative for diaphoresis and weight loss.  HENT: Positive for congestion and sore throat.   Eyes: Negative for blurred vision, double vision and pain.  Respiratory: Positive for cough. Negative for shortness of breath.   Cardiovascular: Negative for chest pain, palpitations, orthopnea and leg swelling.  Gastrointestinal: Negative for abdominal pain.  Skin: Negative for rash.  Neurological: Positive for headaches. Negative for dizziness,  sensory change, loss of consciousness and weakness.  Endo/Heme/Allergies: Negative for polydipsia. Does not bruise/bleed easily.  Psychiatric/Behavioral: Negative for memory loss. The patient does not have insomnia.   All other systems reviewed and are negative.    Observations/Objective: Alert and oriented- answers all questions appropriately Seems stressed  Assessment and Plan: ZAMERIA VOGL comes in today with chief complaint of Medical Management of Chronic Issues   Diagnosis and orders addressed:  1. Essential hypertension, malignant Low sodium diet - lisinopril (ZESTRIL) 20 MG tablet; Take 1 tablet (20 mg total) by mouth daily.  Dispense: 90 tablet; Refill: 1 - furosemide (LASIX) 20 MG tablet; TAKE 1 TABLET BY MOUTH  EVERY DAY  Dispense: 90 tablet; Refill: 1  2. Type 2 diabetes mellitus with diabetic polyneuropathy, with long-term current use of insulin (HCC) Strict carb counting Increased toujeo to 80u BID Increased sliding scale to: <80- 0 81-120- 13u 121-150- 15u 151-200- 16u 201-250- 18u 251-300- 20u >301 22u - metFORMIN (GLUCOPHAGE) 500 MG tablet; Take 1 tablet (500 mg total) by mouth 2 (two) times daily with a meal.  Dispense: 180 tablet; Refill: 1 - insulin regular (NOVOLIN R) 100 units/mL injection; INJECT 22UNITS 4 TIMES A DAY PER SLIDING SCALE  Dispense: 30 mL; Refill: 2 - Insulin Glargine, 1 Unit Dial, (TOUJEO SOLOSTAR) 300 UNIT/ML SOPN; Inject 80 Units into the skin 2 (two) times a day.  Dispense: 10 pen; Refill: 5  3. Mixed hyperlipidemia Low fat diet - fenofibrate 160 MG tablet; Take 1 tablet (160 mg total) by mouth daily.  Dispense: 90 tablet; Refill: 1  4. Episode of recurrent major depressive disorder, unspecified depression episode severity ([place]) Stress management - sertraline (ZOLOFT) 100 MG tablet; Take 2 tablets (200 mg total) by mouth daily.  Dispense: 180 tablet; Refill: 1  5. GAD (generalized anxiety disorder) - LORazepam (ATIVAN) 1 MG  tablet; Take 1 tablet (1 mg total) by mouth 3 (three) times daily.  Dispense: 90 tablet; Refill: 2  6. Low serum vitamin D Need to take daily vitamin d supplement  7. Sleep apnea, unspecified type Needs sleep syudy once covid is over  8. Osteopenia, unspecified location Weight bearing exercise  9. BMI 32.0-32.9,adult Discussed diet and exercise for person with BMI >25 Will recheck weight in 3-6 months   Labs pending Health Maintenance reviewed Diet and exercise encouraged  Follow up plan: 3 months     I discussed the assessment and treatment plan with the patient. The patient was provided an opportunity to ask questions and all were answered. The patient agreed with the plan and demonstrated an understanding of the instructions.   The patient was advised to call back or seek an in-person evaluation if the symptoms worsen or if the condition fails to improve as anticipated.  The above assessment and management plan was discussed with the patient. The patient verbalized understanding of and has agreed to the management plan. Patient is aware to call the clinic if symptoms persist or worsen. Patient is aware when to return to the clinic for a follow-up visit. Patient educated on when it is appropriate to go to the emergency department.   Time call ended:  3:45 PM  I provided 20 minutes of non-face-to-face time during this encounter.    Mary-Margaret Hassell Done, FNP

## 2018-09-04 ENCOUNTER — Other Ambulatory Visit: Payer: Self-pay | Admitting: Nurse Practitioner

## 2018-09-14 ENCOUNTER — Telehealth: Payer: Self-pay | Admitting: Nurse Practitioner

## 2018-09-14 NOTE — Chronic Care Management (AMB) (Signed)
°  Chronic Care Management   Outreach Note  09/14/2018 Name: SULEMA BRAID MRN: [id number] DOB: 12-May-1945  Referred by: Chevis Pretty, FNP Reason for referral : Chronic Care Management (Initial CCM outreach was unsuccessful )   An unsuccessful telephone outreach was attempted today. The patient was referred to the case management team by for assistance with chronic care management and care coordination.   Follow Up Plan: The care management team will reach out to the patient again over the next 7 days.   [place]  [email or web address]   ??[phone]

## 2018-09-19 NOTE — Chronic Care Management (AMB) (Signed)
°  Chronic Care Management   Outreach Note  09/19/2018 Name: Alexandra Hunt MRN: [id number] DOB: [birth year]  Referred by: Chevis Pretty, FNP Reason for referral : Chronic Care Management (Initial CCM outreach was unsuccessful ) and Chronic Care Management (Second CCM outreach was unsuccessful. )   A second unsuccessful telephone outreach was attempted today. The patient was referred to the case management team for assistance with chronic care management and care coordination.   Follow Up Plan: A HIPPA compliant phone message was left for the patient providing contact information and requesting a return call.  The care management team will reach out to the patient again over the next 7 days.  If patient returns call to provider office, please advise to call [facility] at [place]  [email or web address]   ??[phone]

## 2018-09-26 ENCOUNTER — Encounter

## 2018-09-27 NOTE — Chronic Care Management (AMB) (Signed)
°  Chronic Care Management   Outreach Note  09/27/2018 Name: Alexandra Hunt MRN: [id number] DOB: Feb 22, 1946  Referred by: Chevis Pretty, FNP Reason for referral : Chronic Care Management (Initial CCM outreach was unsuccessful ), Chronic Care Management (Second CCM outreach was unsuccessful. ), and Chronic Care Management (Third CCM outreach was unsuccessful. )   Third unsuccessful telephone outreach was attempted today. The patient was referred to the case management team for assistance with chronic care management and care coordination. The patient's primary care provider has been notified of our unsuccessful attempts to make or maintain contact with the patient. The care management team is pleased to engage with this patient at any time in the future should he/she be interested in assistance from the care management team.   Follow Up Plan: The care management team is available to follow up with the patient after provider conversation with the patient regarding recommendation for care management engagement and subsequent re-referral to the care management team.   [place]  [email or web address]   ??[phone]

## 2018-10-13 ENCOUNTER — Encounter

## 2018-10-26 DIAGNOSIS — R0981 Nasal congestion: Secondary | ICD-10-CM | POA: Diagnosis not present

## 2018-10-26 DIAGNOSIS — Z20828 Contact with and (suspected) exposure to other viral communicable diseases: Secondary | ICD-10-CM | POA: Diagnosis not present

## 2018-10-26 DIAGNOSIS — E119 Type 2 diabetes mellitus without complications: Secondary | ICD-10-CM | POA: Diagnosis not present

## 2018-10-26 DIAGNOSIS — R05 Cough: Secondary | ICD-10-CM | POA: Diagnosis not present

## 2018-10-26 DIAGNOSIS — I10 Essential (primary) hypertension: Secondary | ICD-10-CM | POA: Diagnosis not present

## 2018-10-26 DIAGNOSIS — H938X3 Other specified disorders of ear, bilateral: Secondary | ICD-10-CM | POA: Diagnosis not present

## 2018-10-26 DIAGNOSIS — R51 Headache: Secondary | ICD-10-CM | POA: Diagnosis not present

## 2018-10-27 DIAGNOSIS — J029 Acute pharyngitis, unspecified: Secondary | ICD-10-CM | POA: Diagnosis not present

## 2018-10-27 DIAGNOSIS — R0602 Shortness of breath: Secondary | ICD-10-CM | POA: Insufficient documentation

## 2018-10-27 DIAGNOSIS — J0100 Acute maxillary sinusitis, unspecified: Secondary | ICD-10-CM | POA: Diagnosis not present

## 2018-11-24 ENCOUNTER — Other Ambulatory Visit: Payer: Self-pay | Admitting: Nurse Practitioner

## 2018-12-07 DIAGNOSIS — H25013 Cortical age-related cataract, bilateral: Secondary | ICD-10-CM | POA: Diagnosis not present

## 2018-12-07 DIAGNOSIS — H35033 Hypertensive retinopathy, bilateral: Secondary | ICD-10-CM | POA: Diagnosis not present

## 2018-12-07 DIAGNOSIS — H40023 Open angle with borderline findings, high risk, bilateral: Secondary | ICD-10-CM | POA: Diagnosis not present

## 2018-12-07 DIAGNOSIS — H2513 Age-related nuclear cataract, bilateral: Secondary | ICD-10-CM | POA: Diagnosis not present

## 2018-12-07 DIAGNOSIS — H2512 Age-related nuclear cataract, left eye: Secondary | ICD-10-CM | POA: Diagnosis not present

## 2018-12-07 DIAGNOSIS — H353132 Nonexudative age-related macular degeneration, bilateral, intermediate dry stage: Secondary | ICD-10-CM | POA: Diagnosis not present

## 2018-12-13 ENCOUNTER — Telehealth: Payer: Self-pay | Admitting: Nurse Practitioner

## 2018-12-13 NOTE — Telephone Encounter (Signed)
What is the name of the medication? insulin regular (NOVOLIN R) 100 units/mL injection  Have you contacted your pharmacy to request a refill? yes  Which pharmacy would you like this sent to? [facility]   Patient notified that their request is being sent to the clinical staff for review and that they should receive a call once it is complete. If they do not receive a call within 24 hours they can check with their pharmacy or our office.

## 2018-12-20 ENCOUNTER — Telehealth: Payer: Self-pay | Admitting: Nurse Practitioner

## 2018-12-21 NOTE — Telephone Encounter (Signed)
NA mailbox full. Forms were sent back on 11/17/18

## 2019-02-09 DIAGNOSIS — J0100 Acute maxillary sinusitis, unspecified: Secondary | ICD-10-CM | POA: Diagnosis not present

## 2019-02-09 DIAGNOSIS — J029 Acute pharyngitis, unspecified: Secondary | ICD-10-CM | POA: Diagnosis not present

## 2019-02-09 DIAGNOSIS — H60313 Diffuse otitis externa, bilateral: Secondary | ICD-10-CM | POA: Diagnosis not present

## 2019-04-07 ENCOUNTER — Other Ambulatory Visit: Payer: Self-pay | Admitting: Nurse Practitioner

## 2019-04-07 DIAGNOSIS — F411 Generalized anxiety disorder: Secondary | ICD-10-CM

## 2019-04-10 ENCOUNTER — Encounter: Payer: Self-pay | Admitting: Nurse Practitioner

## 2019-04-10 ENCOUNTER — Ambulatory Visit (INDEPENDENT_AMBULATORY_CARE_PROVIDER_SITE_OTHER): Payer: Medicare HMO | Admitting: Nurse Practitioner

## 2019-04-10 DIAGNOSIS — E1142 Type 2 diabetes mellitus with diabetic polyneuropathy: Secondary | ICD-10-CM

## 2019-04-10 DIAGNOSIS — J320 Chronic maxillary sinusitis: Secondary | ICD-10-CM

## 2019-04-10 DIAGNOSIS — E782 Mixed hyperlipidemia: Secondary | ICD-10-CM

## 2019-04-10 DIAGNOSIS — F411 Generalized anxiety disorder: Secondary | ICD-10-CM

## 2019-04-10 DIAGNOSIS — I10 Essential (primary) hypertension: Secondary | ICD-10-CM

## 2019-04-10 DIAGNOSIS — R7989 Other specified abnormal findings of blood chemistry: Secondary | ICD-10-CM | POA: Diagnosis not present

## 2019-04-10 DIAGNOSIS — F339 Major depressive disorder, recurrent, unspecified: Secondary | ICD-10-CM

## 2019-04-10 DIAGNOSIS — Z6832 Body mass index (BMI) 32.0-32.9, adult: Secondary | ICD-10-CM

## 2019-04-10 DIAGNOSIS — Z794 Long term (current) use of insulin: Secondary | ICD-10-CM

## 2019-04-10 MED ORDER — METFORMIN HCL 500 MG PO TABS: 500.0000 mg | ORAL_TABLET | Freq: Two times a day (BID) | ORAL | 1 refills | Status: DC

## 2019-04-10 MED ORDER — LISINOPRIL 20 MG PO TABS: 20.0000 mg | ORAL_TABLET | Freq: Every day | ORAL | 1 refills | Status: DC

## 2019-04-10 MED ORDER — INSULIN REGULAR HUMAN 100 UNIT/ML IJ SOLN: INTRAMUSCULAR | 2 refills | Status: DC

## 2019-04-10 MED ORDER — FENOFIBRATE 160 MG PO TABS: 160.0000 mg | ORAL_TABLET | Freq: Every day | ORAL | 1 refills | Status: DC

## 2019-04-10 MED ORDER — FUROSEMIDE 20 MG PO TABS: ORAL_TABLET | ORAL | 1 refills | Status: DC

## 2019-04-10 MED ORDER — LORAZEPAM 1 MG PO TABS: 1.0000 mg | ORAL_TABLET | Freq: Three times a day (TID) | ORAL | 2 refills | Status: DC

## 2019-04-10 NOTE — Progress Notes (Signed)
Virtual Visit via telephone Note Due to COVID-19 pandemic this visit was conducted virtually. This visit type was conducted due to national recommendations for restrictions regarding the COVID-19 Pandemic (e.g. social distancing, sheltering in place) in an effort to limit this patient's exposure and mitigate transmission in our community. All issues noted in this document were discussed and addressed.  A physical exam was not performed with this format.  I connected with Alexandra Hunt on 04/10/19 at 12:05 by telephone and verified that I am speaking with the correct person using two identifiers. Alexandra Hunt is currently located at home and no one is currently with  her during visit. The provider, Mary-Margaret Hassell Done, FNP is located in their office at time of visit.  I discussed the limitations, risks, security and privacy concerns of performing an evaluation and management service by telephone and the availability of in person appointments. I also discussed with the patient that there may be a patient responsible charge related to this service. The patient expressed understanding and agreed to proceed.   History and Present Illness:   Chief Complaint: Medical Management of Chronic Issues    HPI:  1. Essential hypertension, malignant No c/o chest pain, sob or headache. Does not check blood pressure at home. BP Readings from Last 3 Encounters:  05/05/18 128/63  03/14/18 (!) 143/63  11/09/17 (!) 120/58     2. Type 2 diabetes mellitus with diabetic polyneuropathy, with long-term current use of insulin (HCC) fasting blood sugars are  Running high. They go from 120-300. Sh is not watching diet and is doing no exercise. She will not come into office to have labs done. She is on 80u of insulin BID  Lab Results  Component Value Date   HGBA1C 9.3 (H) 05/05/2018     3. Mixed hyperlipidemia Does not watch diet and does no exercsie. Lab Results  Component Value Date   CHOL 141  05/05/2018   HDL 28 (L) 05/05/2018   LDLCALC 67 05/05/2018   TRIG 231 (H) 05/05/2018   CHOLHDL 5.0 (H) 05/05/2018     4. Episode of recurrent major depressive disorder, unspecified depression episode severity ([place]) She stays depressed. She is on zoloft daily Depression screen [facility] 2/9 04/10/2019 08/07/2018 07/03/2018  Decreased Interest _0 Down, Depressed, Hopeless _1 PHQ - 2 Score _2 Altered sleeping _3 Tired, decreased energy _4 Change in appetite 0 2 1  Feeling bad or failure about yourself  0 0 0  Trouble concentrating 0 0 0  Moving slowly or fidgety/restless 0 1 0  Suicidal thoughts 0 0 0  PHQ-9 Score _5 Difficult doing work/chores Somewhat difficult Somewhat difficult -  Some recent data might be hidden     5. GAD (generalized anxiety disorder) Is on ativan 3x a day. No real change. GAD 7 : Generalized Anxiety Score 04/10/2019 11/09/2017 11/27/2015  Nervous, Anxious, on Edge _6 Control/stop worrying _7 Worry too much - different things _8 Trouble relaxing 0 2 3  Restless 0 2 3  Easily annoyed or irritable 0 3 2  Afraid - awful might happen 0 3 3  Total GAD 7 Score _9 Anxiety Difficulty Somewhat difficult - Very difficult      6. Low serum vitamin D Does not take daily vitamin d supplement  7. BMI 32.0-32.9,adult No recent weight chnages  Wt Readings from Last 3 Encounters:  05/05/18 206 lb (93.4 kg)  03/14/18 211 lb (95.7 kg)  02/28/18 211 lb (95.7 kg)   BMI Readings from Last 3 Encounters:  05/05/18 35.36 kg/m  03/14/18 36.22 kg/m  02/28/18 36.22 kg/m       Outpatient Encounter Medications as of 04/10/2019  Medication Sig  . Alcohol Swabs (B-D SINGLE USE SWABS REGULAR) PADS Use up to 4 times daily to check blood sugars Dx E11.9  . aspirin 81 MG tablet Take 81 mg by mouth daily.  . Cholecalciferol (VITAMIN D3) 5000 units CAPS ergocalciferol (vitamin D2) 50,000 unit capsule  TAKE 1 CAPSULE (50,000 UNITS TOTAL) BY  MOUTH EVERY 7 (SEVEN) DAYS.  Marland Kitchen diclofenac (VOLTAREN) 75 MG EC tablet TAKE 1 TABLET BY MOUTH TWICE A DAY AS NEEDED  . dorzolamide (TRUSOPT) 2 % ophthalmic solution Place 2 drops into both eyes 2 (two) times daily.  . fenofibrate 160 MG tablet Take 1 tablet (160 mg total) by mouth daily.  . fluticasone (FLONASE) 50 MCG/ACT nasal spray Place 2 sprays into both nostrils daily.  . furosemide (LASIX) 20 MG tablet TAKE 1 TABLET BY MOUTH EVERY DAY  . ibuprofen (ADVIL,MOTRIN) 800 MG tablet TAKE 1 TABLET BY MOUTH EVERY 8 HOURS AS NEEDED  . Insulin Glargine, 1 Unit Dial, (TOUJEO SOLOSTAR) 300 UNIT/ML SOPN Inject 80 Units into the skin 2 (two) times a day.  . Insulin Pen Needle (B-D UF III MINI PEN NEEDLES) 31G X 5 MM MISC USE WITH SLIDING SCALE HUMALOG 4 TIMES DAILY DX: E11.9  . insulin regular (NOVOLIN R) 100 units/mL injection INJECT 22UNITS 4 TIMES A DAY PER SLIDING SCALE  . Insulin Syringe-Needle U-100 (B-D INS SYR ULTRAFINE 1CC/31G) 31G X 5/16" 1 ML MISC USE DAILY WITH LANTUS AND HUMALOG INSULIN SLIDING SCALES AS INSTRUCTED  . Lancet Device MISC USE TO CHECK BLOOD SUGAR TWICE DAILY AND AS NEEDED  E11.9  . lisinopril (ZESTRIL) 20 MG tablet Take 1 tablet (20 mg total) by mouth daily.  Marland Kitchen LORazepam (ATIVAN) 1 MG tablet Take 1 tablet (1 mg total) by mouth 3 (three) times daily.  . magic mouthwash SOLN Take 5 mLs by mouth 4 (four) times daily.  . metFORMIN (GLUCOPHAGE) 500 MG tablet Take 1 tablet (500 mg total) by mouth 2 (two) times daily with a meal.  . mupirocin cream (BACTROBAN) 2 % Apply 1 application topically 2 (two) times daily.  . ondansetron (ZOFRAN) 4 MG tablet   . sertraline (ZOLOFT) 100 MG tablet Take 2 tablets (200 mg total) by mouth daily.  . silver sulfADIAZINE (SILVADENE) 1 % cream Apply 1 application topically 2 (two) times daily.  . TRUE METRIX BLOOD GLUCOSE TEST test strip TEST BLOOD GLUCOSE UP TO FOUR TIMES DAILY  . TRUEPLUS LANCETS 33G MISC Check blood sugar up to 4 times daily  .  Vitamin D, Ergocalciferol, (DRISDOL) 1.25 MG (50000 UNIT) CAPS capsule TAKE 1 CAPSULE (50,000 UNITS TOTAL) BY MOUTH EVERY 7 (SEVEN) DAYS.     Past Surgical History:  Procedure Laterality Date  . ABDOMINAL HYSTERECTOMY     due to uterine cancer  . BREAST SURGERY     left breast biopsy/benign  . TUBAL LIGATION      Family History  Problem Relation Age of Onset  . Diabetes Mother   . Blindness Mother        related to diabetes  . Stroke Mother   . Heart disease Father        MI  .  Heart attack Father   . Heart defect Father   . Breast cancer Sister   . Diabetes Brother        diet controlled  . Colon cancer Neg Hx   . Esophageal cancer Neg Hx   . Rectal cancer Neg Hx   . Stomach cancer Neg Hx     New complaints: Chronic sinus issues for greater then 3 months. Has ahd steroids an antibiotics and is no bettter  Social history: Has 10 people living in her house  Controlled substance contract: will have signed at next visit.    Review of Systems  Constitutional: Negative for diaphoresis and weight loss.  Eyes: Negative for blurred vision, double vision and pain.  Respiratory: Negative for shortness of breath.   Cardiovascular: Negative for chest pain, palpitations, orthopnea and leg swelling.  Gastrointestinal: Negative for abdominal pain.  Skin: Negative for rash.  Neurological: Negative for dizziness, sensory change, loss of consciousness, weakness and headaches.  Endo/Heme/Allergies: Negative for polydipsia. Does not bruise/bleed easily.  Psychiatric/Behavioral: Negative for memory loss. The patient does not have insomnia.   All other systems reviewed and are negative.    Observations/Objective: Alert and oriented- answers all questions appropriately No distress No cough  Assessment and Plan: LOY LITTLE comes in today with chief complaint of Medical Management of Chronic Issues   Diagnosis and orders addressed:  1. Essential hypertension,  malignant lowsodium diet - CBC with Differential/Platelet - CMP14+EGFR - lisinopril (ZESTRIL) 20 MG tablet; Take 1 tablet (20 mg total) by mouth daily.  Dispense: 90 tablet; Refill: 1 - furosemide (LASIX) 20 MG tablet; TAKE 1 TABLET BY MOUTH EVERY DAY  Dispense: 90 tablet; Refill: 1  2. Type 2 diabetes mellitus with diabetic polyneuropathy, with long-term current use of insulin (HCC) Strict carb counting MUST COMRE IN AND HAVE HGBA!C - Bayer DCA Hb A1c Waived - metFORMIN (GLUCOPHAGE) 500 MG tablet; Take 1 tablet (500 mg total) by mouth 2 (two) times daily with a meal.  Dispense: 180 tablet; Refill: 1 - insulin regular (NOVOLIN R) 100 units/mL injection; INJECT 22UNITS 4 TIMES A DAY PER SLIDING SCALE  Dispense: 30 mL; Refill: 2  3. Mixed hyperlipidemia Low fat diet - Lipid panel - fenofibrate 160 MG tablet; Take 1 tablet (160 mg total) by mouth daily.  Dispense: 90 tablet; Refill: 1  4. Episode of recurrent major depressive disorder, unspecified depression episode severity ([place]) Stress management  5. GAD (generalized anxiety disorder) - LORazepam (ATIVAN) 1 MG tablet; Take 1 tablet (1 mg total) by mouth 3 (three) times daily.  Dispense: 90 tablet; Refill: 2  6. Low serum vitamin D Back on daily vitamin d supplement  7. BMI 32.0-32.9,adult Discussed diet and exercise for person with BMI >25 Will recheck weight in 3-6 months  8. Chronic maxillary sinusitis - Ambulatory referral to ENT   {ATIENT GOING TO COME IN FOR LABS ASAP Health Maintenance reviewed Diet and exercise encouraged  Follow up plan: 3 months     I discussed the assessment and treatment plan with the patient. The patient was provided an opportunity to ask questions and all were answered. The patient agreed with the plan and demonstrated an understanding of the instructions.   The patient was advised to call back or seek an in-person evaluation if the symptoms worsen or if the condition fails to improve as  anticipated.  The above assessment and management plan was discussed with the patient. The patient verbalized understanding of and has agreed to the  management plan. Patient is aware to call the clinic if symptoms persist or worsen. Patient is aware when to return to the clinic for a follow-up visit. Patient educated on when it is appropriate to go to the emergency department.   Time call ended:  12:30  I provided 25 minutes of non-face-to-face time during this encounter.    Mary-Margaret Hassell Done, FNP

## 2019-04-10 NOTE — Patient Instructions (Signed)
Carbohydrate Counting for Diabetes Mellitus, Adult  Carbohydrate counting is a method of keeping track of how many carbohydrates you eat. Eating carbohydrates naturally increases the amount of sugar (glucose) in the blood. Counting how many carbohydrates you eat helps keep your blood glucose within normal limits, which helps you manage your diabetes (diabetes mellitus). It is important to know how many carbohydrates you can safely have in each meal. This is different for every person. A diet and nutrition specialist (registered dietitian) can help you make a meal plan and calculate how many carbohydrates you should have at each meal and snack. Carbohydrates are found in the following foods:  Grains, such as breads and cereals.  Dried beans and soy products.  Starchy vegetables, such as potatoes, peas, and corn.  Fruit and fruit juices.  Milk and yogurt.  Sweets and snack foods, such as cake, cookies, candy, chips, and soft drinks. How do I count carbohydrates? There are two ways to count carbohydrates in food. You can use either of the methods or a combination of both. Reading "Nutrition Facts" on packaged food The "Nutrition Facts" list is included on the labels of almost all packaged foods and beverages in the [place] It includes:  The serving size.  Information about nutrients in each serving, including the grams (g) of carbohydrate per serving. To use the "Nutrition Facts":  Decide how many servings you will have.  Multiply the number of servings by the number of carbohydrates per serving.  The resulting number is the total amount of carbohydrates that you will be having. Learning standard serving sizes of other foods When you eat carbohydrate foods that are not packaged or do not include "Nutrition Facts" on the label, you need to measure the servings in order to count the amount of carbohydrates:  Measure the foods that you will eat with a food scale or measuring cup, if  needed.  Decide how many standard-size servings you will eat.  Multiply the number of servings by 15. Most carbohydrate-rich foods have about 15 g of carbohydrates per serving. ? For example, if you eat 8 oz (170 g) of strawberries, you will have eaten 2 servings and 30 g of carbohydrates (2 servings x 15 g = 30 g).  For foods that have more than one food mixed, such as soups and casseroles, you must count the carbohydrates in each food that is included. The following list contains standard serving sizes of common carbohydrate-rich foods. Each of these servings has about 15 g of carbohydrates:   hamburger bun or  English muffin.   oz (15 mL) syrup.   oz (14 g) jelly.  1 slice of bread.  1 six-inch tortilla.  3 oz (85 g) cooked rice or pasta.  4 oz (113 g) cooked dried beans.  4 oz (113 g) starchy vegetable, such as peas, corn, or potatoes.  4 oz (113 g) hot cereal.  4 oz (113 g) mashed potatoes or  of a large baked potato.  4 oz (113 g) canned or frozen fruit.  4 oz (120 mL) fruit juice.  4-6 crackers.  6 chicken nuggets.  6 oz (170 g) unsweetened dry cereal.  6 oz (170 g) plain fat-free yogurt or yogurt sweetened with artificial sweeteners.  8 oz (240 mL) milk.  8 oz (170 g) fresh fruit or one small piece of fruit.  24 oz (680 g) popped popcorn. Example of carbohydrate counting Sample meal  3 oz (85 g) chicken breast.  6 oz (170 g)   brown rice.  4 oz (113 g) corn.  8 oz (240 mL) milk.  8 oz (170 g) strawberries with sugar-free whipped topping. Carbohydrate calculation 1. Identify the foods that contain carbohydrates: ? Rice. ? Corn. ? Milk. ? Strawberries. 2. Calculate how many servings you have of each food: ? 2 servings rice. ? 1 serving corn. ? 1 serving milk. ? 1 serving strawberries. 3. Multiply each number of servings by 15 g: ? 2 servings rice x 15 g = 30 g. ? 1 serving corn x 15 g = 15 g. ? 1 serving milk x 15 g = 15 g. ? 1  serving strawberries x 15 g = 15 g. 4. Add together all of the amounts to find the total grams of carbohydrates eaten: ? 30 g + 15 g + 15 g + 15 g = 75 g of carbohydrates total. Summary  Carbohydrate counting is a method of keeping track of how many carbohydrates you eat.  Eating carbohydrates naturally increases the amount of sugar (glucose) in the blood.  Counting how many carbohydrates you eat helps keep your blood glucose within normal limits, which helps you manage your diabetes.  A diet and nutrition specialist (registered dietitian) can help you make a meal plan and calculate how many carbohydrates you should have at each meal and snack. This information is not intended to replace advice given to you by your health care provider. Make sure you discuss any questions you have with your health care provider. Document Revised: 09/09/2016 Document Reviewed: 07/30/2015 Elsevier Patient Education  2020 Elsevier Inc.  

## 2019-04-27 DIAGNOSIS — J310 Chronic rhinitis: Secondary | ICD-10-CM | POA: Insufficient documentation

## 2019-04-27 DIAGNOSIS — J342 Deviated nasal septum: Secondary | ICD-10-CM | POA: Insufficient documentation

## 2019-04-27 DIAGNOSIS — J0141 Acute recurrent pansinusitis: Secondary | ICD-10-CM | POA: Insufficient documentation

## 2019-04-27 DIAGNOSIS — H66006 Acute suppurative otitis media without spontaneous rupture of ear drum, recurrent, bilateral: Secondary | ICD-10-CM | POA: Insufficient documentation

## 2019-05-14 ENCOUNTER — Other Ambulatory Visit: Payer: Self-pay | Admitting: Nurse Practitioner

## 2019-05-14 MED ORDER — VITAMIN D3 125 MCG (5000 UT) PO CAPS: 1.0000 | ORAL_CAPSULE | ORAL | 1 refills | Status: DC

## 2019-05-14 NOTE — Telephone Encounter (Signed)
  Medication Request  05/14/2019  What is the name of the medication?Cholecalciferol (VITAMIN D3) 5000 units CAPS    Have you contacted your pharmacy to request a refill? yes  Which pharmacy would you like this sent to? [facility]   Patient notified that their request is being sent to the clinical staff for review and that they should receive a call once it is complete. If they do not receive a call within 24 hours they can check with their pharmacy or our office.    Medication Request  05/14/2019  What is the name of the medication? Symbicort Inhaler  Have you contacted your pharmacy to request a refill? No, she was on albuterol and MMM told pt she would change it to something cheaper  Which pharmacy would you like this sent to? [facility]    Patient notified that their request is being sent to the clinical staff for review and that they should receive a call once it is complete. If they do not receive a call within 24 hours they can check with their pharmacy or our office.

## 2019-05-14 NOTE — Telephone Encounter (Signed)
[facility]   Other call is on her albuterol changing to something cheaper, hand written script to go to [facility]

## 2019-05-18 DIAGNOSIS — J0141 Acute recurrent pansinusitis: Secondary | ICD-10-CM | POA: Diagnosis not present

## 2019-05-18 DIAGNOSIS — H6983 Other specified disorders of Eustachian tube, bilateral: Secondary | ICD-10-CM | POA: Insufficient documentation

## 2019-05-18 DIAGNOSIS — J343 Hypertrophy of nasal turbinates: Secondary | ICD-10-CM | POA: Insufficient documentation

## 2019-05-18 DIAGNOSIS — J329 Chronic sinusitis, unspecified: Secondary | ICD-10-CM | POA: Diagnosis not present

## 2019-05-18 DIAGNOSIS — J342 Deviated nasal septum: Secondary | ICD-10-CM | POA: Diagnosis not present

## 2019-05-18 DIAGNOSIS — H6993 Unspecified Eustachian tube disorder, bilateral: Secondary | ICD-10-CM | POA: Insufficient documentation

## 2019-05-18 DIAGNOSIS — J310 Chronic rhinitis: Secondary | ICD-10-CM | POA: Diagnosis not present

## 2019-06-20 ENCOUNTER — Encounter: Payer: Self-pay | Admitting: Nurse Practitioner

## 2019-06-20 ENCOUNTER — Ambulatory Visit (INDEPENDENT_AMBULATORY_CARE_PROVIDER_SITE_OTHER): Payer: Medicare HMO | Admitting: Nurse Practitioner

## 2019-06-20 DIAGNOSIS — R531 Weakness: Secondary | ICD-10-CM | POA: Diagnosis not present

## 2019-06-20 NOTE — Progress Notes (Signed)
   Virtual Visit via telephone Note Due to COVID-19 pandemic this visit was conducted virtually. This visit type was conducted due to national recommendations for restrictions regarding the COVID-19 Pandemic (e.g. social distancing, sheltering in place) in an effort to limit this patient's exposure and mitigate transmission in our community. All issues noted in this document were discussed and addressed.  A physical exam was not performed with this format.  I connected with Alexandra Hunt on 06/20/19 at 8:00 by telephone and verified that I am speaking with the correct person using two identifiers. Alexandra Hunt is currently located at home and son and daughter are there but are in bed during visit. The provider, Mary-Margaret Hassell Done, FNP is located in their office at time of visit.  I discussed the limitations, risks, security and privacy concerns of performing an evaluation and management service by telephone and the availability of in person appointments. I also discussed with the patient that there may be a patient responsible charge related to this service. The patient expressed understanding and agreed to proceed.   History and Present Illness:   Chief Complaint: Referral   HPI patient calls in stating that she has had some right sided weakness and numbness all on right side of body including face. She says it is intermittent and wants to be referred to neurology. Is fine right now.  * she is under a lot of stress with her children and she does not watch diet and blood sugars are always crazy. She did not come in and have labs repeated as instructed  To do so.  Lab Results  Component Value Date   HGBA1C 9.3 (H) 05/05/2018     Review of Systems  Constitutional: Negative.   Respiratory: Negative.   Cardiovascular: Negative.   Neurological: Positive for weakness. Negative for dizziness, speech change and headaches.  Psychiatric/Behavioral: Negative.   All other systems reviewed and  are negative.    Observations/Objective: Alert and oriented- answers all questions appropriately No distress Speech is normal  Assessment and Plan: Alexandra Hunt in today with chief complaint of Referral   1. Right sided weakness Fall preventioln Watch blood sugars more closely Stress management    Follow Up Instructions: prn    I discussed the assessment and treatment plan with the patient. The patient was provided an opportunity to ask questions and all were answered. The patient agreed with the plan and demonstrated an understanding of the instructions.   The patient was advised to call back or seek an in-person evaluation if the symptoms worsen or if the condition fails to improve as anticipated.  The above assessment and management plan was discussed with the patient. The patient verbalized understanding of and has agreed to the management plan. Patient is aware to call the clinic if symptoms persist or worsen. Patient is aware when to return to the clinic for a follow-up visit. Patient educated on when it is appropriate to go to the emergency department.   Time call ended:  8:15  I provided 15 minutes of non-face-to-face time during this encounter.    Mary-Margaret Hassell Done, FNP

## 2019-06-21 ENCOUNTER — Other Ambulatory Visit: Payer: Self-pay | Admitting: *Deleted

## 2019-06-21 MED ORDER — TRUEPLUS LANCETS 30G MISC: 3 refills | Status: DC

## 2019-06-21 MED ORDER — TRUE METRIX AIR GLUCOSE METER W/DEVICE KIT: PACK | 0 refills | Status: DC

## 2019-06-21 MED ORDER — TRUE METRIX LEVEL 2 NORMAL VI SOLN: 0 refills | Status: DC

## 2019-06-21 MED ORDER — TRUE METRIX BLOOD GLUCOSE TEST VI STRP: ORAL_STRIP | 3 refills | Status: DC

## 2019-06-21 MED ORDER — BD SWAB SINGLE USE REGULAR PADS: MEDICATED_PAD | 3 refills | Status: DC

## 2019-06-25 ENCOUNTER — Telehealth: Payer: Self-pay

## 2019-06-25 DIAGNOSIS — R1111 Vomiting without nausea: Secondary | ICD-10-CM | POA: Diagnosis not present

## 2019-06-25 DIAGNOSIS — R000 Tachycardia, unspecified: Secondary | ICD-10-CM | POA: Diagnosis not present

## 2019-06-25 DIAGNOSIS — R197 Diarrhea, unspecified: Secondary | ICD-10-CM | POA: Diagnosis not present

## 2019-06-25 DIAGNOSIS — R457 State of emotional shock and stress, unspecified: Secondary | ICD-10-CM | POA: Diagnosis not present

## 2019-06-25 DIAGNOSIS — R110 Nausea: Secondary | ICD-10-CM | POA: Diagnosis not present

## 2019-06-25 NOTE — Telephone Encounter (Signed)
Needs to go to ER because she is a diabetic

## 2019-06-25 NOTE — Telephone Encounter (Signed)
Patient aware.

## 2019-06-25 NOTE — Telephone Encounter (Signed)
Patient reports that she has had nausea, vomiting and diarrhea all day.  She is wanting to know if you will send in something such as Zofran for her to [place].  Please advise.

## 2019-07-10 ENCOUNTER — Ambulatory Visit: Payer: Self-pay | Admitting: Nurse Practitioner

## 2019-07-31 ENCOUNTER — Other Ambulatory Visit: Payer: Self-pay

## 2019-07-31 ENCOUNTER — Encounter: Payer: Self-pay | Admitting: Nurse Practitioner

## 2019-07-31 ENCOUNTER — Ambulatory Visit (INDEPENDENT_AMBULATORY_CARE_PROVIDER_SITE_OTHER): Payer: Medicare HMO | Admitting: Nurse Practitioner

## 2019-07-31 VITALS — BP 111/58 | HR 97 | Temp 97.1°F | Resp 20 | Ht 64.0 in | Wt 201.0 lb

## 2019-07-31 DIAGNOSIS — F339 Major depressive disorder, recurrent, unspecified: Secondary | ICD-10-CM | POA: Diagnosis not present

## 2019-07-31 DIAGNOSIS — I10 Essential (primary) hypertension: Secondary | ICD-10-CM

## 2019-07-31 DIAGNOSIS — E782 Mixed hyperlipidemia: Secondary | ICD-10-CM | POA: Diagnosis not present

## 2019-07-31 DIAGNOSIS — Z6834 Body mass index (BMI) 34.0-34.9, adult: Secondary | ICD-10-CM | POA: Diagnosis not present

## 2019-07-31 DIAGNOSIS — R42 Dizziness and giddiness: Secondary | ICD-10-CM

## 2019-07-31 DIAGNOSIS — M8580 Other specified disorders of bone density and structure, unspecified site: Secondary | ICD-10-CM

## 2019-07-31 DIAGNOSIS — R7989 Other specified abnormal findings of blood chemistry: Secondary | ICD-10-CM | POA: Diagnosis not present

## 2019-07-31 DIAGNOSIS — E1142 Type 2 diabetes mellitus with diabetic polyneuropathy: Secondary | ICD-10-CM | POA: Diagnosis not present

## 2019-07-31 DIAGNOSIS — G4730 Sleep apnea, unspecified: Secondary | ICD-10-CM

## 2019-07-31 DIAGNOSIS — Z794 Long term (current) use of insulin: Secondary | ICD-10-CM | POA: Diagnosis not present

## 2019-07-31 DIAGNOSIS — F411 Generalized anxiety disorder: Secondary | ICD-10-CM

## 2019-07-31 LAB — BAYER DCA HB A1C WAIVED: HB A1C (BAYER DCA - WAIVED): 9.2 % — ABNORMAL HIGH (ref <7.0)

## 2019-07-31 MED ORDER — FENOFIBRATE 160 MG PO TABS: 160.0000 mg | ORAL_TABLET | Freq: Every day | ORAL | 1 refills | Status: DC

## 2019-07-31 MED ORDER — LISINOPRIL 20 MG PO TABS: 20.0000 mg | ORAL_TABLET | Freq: Every day | ORAL | 1 refills | Status: DC

## 2019-07-31 MED ORDER — FUROSEMIDE 20 MG PO TABS: ORAL_TABLET | ORAL | 1 refills | Status: DC

## 2019-07-31 MED ORDER — LORAZEPAM 1 MG PO TABS: 1.0000 mg | ORAL_TABLET | Freq: Three times a day (TID) | ORAL | 2 refills | Status: DC

## 2019-07-31 MED ORDER — SERTRALINE HCL 100 MG PO TABS: 200.0000 mg | ORAL_TABLET | Freq: Every day | ORAL | 1 refills | Status: DC

## 2019-07-31 MED ORDER — FREESTYLE LIBRE 2 READER DEVI: 1.0000 | 0 refills | Status: DC

## 2019-07-31 MED ORDER — FREESTYLE LIBRE 2 SENSOR MISC: 1.0000 | 5 refills | Status: DC

## 2019-07-31 MED ORDER — METFORMIN HCL 500 MG PO TABS: 500.0000 mg | ORAL_TABLET | Freq: Two times a day (BID) | ORAL | 1 refills | Status: DC

## 2019-07-31 MED ORDER — VITAMIN D (ERGOCALCIFEROL) 1.25 MG (50000 UNIT) PO CAPS: 50000.0000 [IU] | ORAL_CAPSULE | ORAL | 0 refills | Status: DC

## 2019-07-31 MED ORDER — MECLIZINE HCL 25 MG PO TABS: 25.0000 mg | ORAL_TABLET | Freq: Three times a day (TID) | ORAL | 0 refills | Status: AC | PRN

## 2019-07-31 NOTE — Progress Notes (Addendum)
Subjective:    Patient ID: Alexandra Hunt, female    DOB: [birth year], 74 y.o.   MRN: [id number]   Chief Complaint: Medical Management of Chronic Issues    HPI:  1. Essential hypertension, malignant No c/o chest pain, Sob or headache. Does not check blood pressure at home. BP Readings from Last 3 Encounters:  07/31/19 (!) 111/58  05/05/18 128/63  03/14/18 (!) 143/63     2. Type 2 diabetes mellitus with diabetic polyneuropathy, with long-term current use of insulin (HCC) Blood sugars are all over the place. Some day they are good and other days they are high. She does not eat regularly. Last visitt was telephone and patient was suppose to come in for lab , which she did not ever do. She is on toujeo 80u bid and novolin 22u 3x a day. Patient checks her blood sugar 4x a day.   -patient is testing blood sugar 4-6 times daily -patient is injecting insulin 5 times daily -patient is making adjustments to insulin based on blood sugar readings -patient would benefit from CGM system (I.e. Libre or Dexcom)  Lab Results  Component Value Date   HGBA1C 9.3 (H) 05/05/2018      3. Mixed hyperlipidemia Doe not watch diet and doe no exercise Lab Results  Component Value Date   CHOL 141 05/05/2018   HDL 28 (L) 05/05/2018   LDLCALC 67 05/05/2018   TRIG 231 (H) 05/05/2018   CHOLHDL 5.0 (H) 05/05/2018     4. Episode of recurrent major depressive disorder, unspecified depression episode severity ([place]) I on zoloft daily and is doing a well as she can be at this time. Depression screen [facility] 2/9 07/31/2019 04/10/2019 08/07/2018  Decreased Interest 0 2 3  Down, Depressed, Hopeless 0 3 2  PHQ - 2 Score 0 5 5  Altered sleeping 0 2 1  Tired, decreased energy 0 2 2  Change in appetite 0 0 2  Feeling bad or failure about yourself  0 0 0  Trouble concentrating 0 0 0  Moving slowly or fidgety/restless 0 0 1  Suicidal thoughts 0 0 0  PHQ-9 Score 0 9 11  Difficult doing work/chores - Somewhat  difficult Somewhat difficult  Some recent data might be hidden     5. GAD (generalized anxiety disorder) I on ativan bid. tays tresed due to ituation of children in her home. GAD 7 : Generalized Anxiety Score 07/31/2019 04/10/2019 11/09/2017 11/27/2015  Nervous, Anxious, on Edge _0 Control/stop worrying _1 Worry too much - different things _2 Trouble relaxing 3 0 2 3  Restless 2 0 2 3  Easily annoyed or irritable 3 0 3 2  Afraid - awful might happen 0 0 3 3  Total GAD 7 Score _3 Anxiety Difficulty - Somewhat difficult - Very difficult      6. Low serum vitamin D Take vitamin d supplement  7. Sleep apnea, unspecified type Doe not wear CPAP machine  8. Osteopenia, unspecified location Last dexascan was done 2017. Needs to be repeated. Patient does no weight bearing exercise  9. BMI 32.0-32.9,adult No recent weight changes Wt Readings from Last 3 Encounters:  07/31/19 201 lb (91.2 kg)  05/05/18 206 lb (93.4 kg)  03/14/18 211 lb (95.7 kg)   BMI Readings from Last 3 Encounters:  07/31/19 34.50 kg/m  05/05/18 35.36 kg/m  03/14/18 36.22 kg/m  Outpatient Encounter Medications as of 07/31/2019  Medication Sig  . Alcohol Swabs (B-D SINGLE USE SWABS REGULAR) PADS TEST BS UP TO FOUR TIMES DAILY Dx E11.9  . aspirin 81 MG tablet Take 81 mg by mouth daily.  . Blood Glucose Calibration (TRUE METRIX LEVEL 2) Normal SOLN Use with glucose machine Dx E11.9  . Blood Glucose Monitoring Suppl (TRUE METRIX AIR GLUCOSE METER) w/Device KIT TEST BS UP TO FOUR TIMES DAILY Dx E11.9  . Cholecalciferol (VITAMIN D3) 125 MCG (5000 UT) CAPS 1 capsule (5,000 Units total) by Per post-pyloric tube route once a week.  . diclofenac (VOLTAREN) 75 MG EC tablet TAKE 1 TABLET BY MOUTH TWICE A DAY AS NEEDED  . dorzolamide (TRUSOPT) 2 % ophthalmic solution Place 2 drops into both eyes 2 (two) times daily.  . fenofibrate 160 MG tablet Take 1 tablet (160 mg total) by mouth daily.   . fluticasone (FLONASE) 50 MCG/ACT nasal spray Place 2 sprays into both nostrils daily.  . furosemide (LASIX) 20 MG tablet TAKE 1 TABLET BY MOUTH EVERY DAY  . glucose blood (TRUE METRIX BLOOD GLUCOSE TEST) test strip TEST BS UP TO FOUR TIMES DAILY Dx E11.9  . ibuprofen (ADVIL,MOTRIN) 800 MG tablet TAKE 1 TABLET BY MOUTH EVERY 8 HOURS AS NEEDED  . Insulin Glargine, 1 Unit Dial, (TOUJEO SOLOSTAR) 300 UNIT/ML SOPN Inject 80 Units into the skin 2 (two) times a day.  . Insulin Pen Needle (B-D UF III MINI PEN NEEDLES) 31G X 5 MM MISC USE WITH SLIDING SCALE HUMALOG 4 TIMES DAILY DX: E11.9  . insulin regular (NOVOLIN R) 100 units/mL injection INJECT 22UNITS 4 TIMES A DAY PER SLIDING SCALE  . Insulin Syringe-Needle U-100 (B-D INS SYR ULTRAFINE 1CC/31G) 31G X 5/16" 1 ML MISC USE DAILY WITH LANTUS AND HUMALOG INSULIN SLIDING SCALES AS INSTRUCTED  . Lancet Device MISC USE TO CHECK BLOOD SUGAR TWICE DAILY AND AS NEEDED  E11.9  . lisinopril (ZESTRIL) 20 MG tablet Take 1 tablet (20 mg total) by mouth daily.  Marland Kitchen LORazepam (ATIVAN) 1 MG tablet Take 1 tablet (1 mg total) by mouth 3 (three) times daily.  . magic mouthwash SOLN Take 5 mLs by mouth 4 (four) times daily.  . metFORMIN (GLUCOPHAGE) 500 MG tablet Take 1 tablet (500 mg total) by mouth 2 (two) times daily with a meal.  . mupirocin cream (BACTROBAN) 2 % Apply 1 application topically 2 (two) times daily.  . ondansetron (ZOFRAN) 4 MG tablet   . sertraline (ZOLOFT) 100 MG tablet Take 2 tablets (200 mg total) by mouth daily.  . silver sulfADIAZINE (SILVADENE) 1 % cream Apply 1 application topically 2 (two) times daily.  . TRUEplus Lancets 30G MISC TEST BS UP TO FOUR TIMES DAILY Dx E11.9  . Vitamin D, Ergocalciferol, (DRISDOL) 1.25 MG (50000 UNIT) CAPS capsule TAKE 1 CAPSULE (50,000 UNITS TOTAL) BY MOUTH EVERY 7 (SEVEN) DAYS.     Past Surgical History:  Procedure Laterality Date  . ABDOMINAL HYSTERECTOMY     due to uterine cancer  . BREAST SURGERY      left breast biopsy/benign  . TUBAL LIGATION      Family History  Problem Relation Age of Onset  . Diabetes Mother   . Blindness Mother        related to diabetes  . Stroke Mother   . Heart disease Father        MI  . Heart attack Father   . Heart defect Father   . Breast  cancer Sister   . Diabetes Brother        diet controlled  . Colon cancer Neg Hx   . Esophageal cancer Neg Hx   . Rectal cancer Neg Hx   . Stomach cancer Neg Hx     New complaints: C/o frequent dizziness  Social history: Has family members that live with her and cause a lot of drama and stress.  Controlled substance contract: 07/31/19     Review of Systems  Constitutional: Positive for fatigue. Negative for diaphoresis.  Eyes: Negative for pain.  Respiratory: Negative for shortness of breath.   Cardiovascular: Negative for chest pain, palpitations and leg swelling.  Gastrointestinal: Negative for abdominal pain.  Endocrine: Negative for polydipsia.  Skin: Negative for rash.  Neurological: Negative for dizziness, weakness and headaches.  Hematological: Does not bruise/bleed easily.  All other systems reviewed and are negative.      Objective:   Physical Exam Vitals and nursing note reviewed.  Constitutional:      General: She is not in acute distress.    Appearance: Normal appearance. She is well-developed.  HENT:     Head: Normocephalic.     Nose: Nose normal.  Eyes:     Pupils: Pupils are equal, round, and reactive to light.  Neck:     Vascular: No carotid bruit or JVD.  Cardiovascular:     Rate and Rhythm: Normal rate and regular rhythm.     Heart sounds: Normal heart sounds.  Pulmonary:     Effort: Pulmonary effort is normal. No respiratory distress.     Breath sounds: Normal breath sounds. No wheezing or rales.  Chest:     Chest wall: No tenderness.  Abdominal:     General: Bowel sounds are normal. There is no distension or abdominal bruit.     Palpations: Abdomen is soft.  There is no hepatomegaly, splenomegaly, mass or pulsatile mass.     Tenderness: There is no abdominal tenderness.  Musculoskeletal:        General: Normal range of motion.     Cervical back: Normal range of motion and neck supple.  Lymphadenopathy:     Cervical: No cervical adenopathy.  Skin:    General: Skin is warm and dry.  Neurological:     Mental Status: She is alert and oriented to person, place, and time.     Deep Tendon Reflexes: Reflexes are normal and symmetric.  Psychiatric:        Behavior: Behavior normal.        Thought Content: Thought content normal.        Judgment: Judgment normal.     BP (!) 111/58   Pulse 97   Temp (!) 97.1 F (36.2 C) (Temporal)   Resp 20   Ht 5' 4" (1.626 m)   Wt 201 lb (91.2 kg)   SpO2 97%   BMI 34.50 kg/m   hgba1c 9.2     Assessment & Plan:  ISHIA TENORIO comes in today with chief complaint of Medical Management of Chronic Issues   Diagnosis and orders addressed:  1. Essential hypertension, malignant Low odium diet - CBC with Differential/Platelet - CMP14+EGFR - lisinopril (ZESTRIL) 20 MG tablet; Take 1 tablet (20 mg total) by mouth daily.  Dispense: 90 tablet; Refill: 1 - furosemide (LASIX) 20 MG tablet; TAKE 1 TABLET BY MOUTH EVERY DAY  Dispense: 90 tablet; Refill: 1  2. Type 2 diabetes mellitus with diabetic polyneuropathy, with long-term current use of insulin ([place]) Stricter  carb counting appointmennt with clinical pharmacist - Bayer DCA Hb A1c Waived - Continuous Blood Gluc Receiver (FREESTYLE LIBRE 2 READER) DEVI; 1 each by Does not apply route every 14 (fourteen) days.  Dispense: 1 each; Refill: 0 - Continuous Blood Gluc Sensor (FREESTYLE LIBRE 2 SENSOR) MISC; 1 each by Does not apply route every 14 (fourteen) days.  Dispense: 2 each; Refill: 5 - metFORMIN (GLUCOPHAGE) 500 MG tablet; Take 1 tablet (500 mg total) by mouth 2 (two) times daily with a meal.  Dispense: 180 tablet; Refill: 1  3. Mixed hyperlipidemia Low  fat diet - Lipid panel - fenofibrate 160 MG tablet; Take 1 tablet (160 mg total) by mouth daily.  Dispense: 90 tablet; Refill: 1  4. Episode of recurrent major depressive disorder, unspecified depression episode severity ([place]) stress management - sertraline (ZOLOFT) 100 MG tablet; Take 2 tablets (200 mg total) by mouth daily.  Dispense: 180 tablet; Refill: 1  5. GAD (generalized anxiety disorder) Again stress management - LORazepam (ATIVAN) 1 MG tablet; Take 1 tablet (1 mg total) by mouth 3 (three) times daily.  Dispense: 90 tablet; Refill: 2  6. Low serum vitamin D Continue vitamind upplement  7. Sleep apnea, unspecified type Will do repeat sleep study after get diabetes under better control  8. Osteopenia, unspecified location Will repeat dexascan at next appointmnet  9. BMI 34.0-34.9,adult Discussed diet and exercise for person with BMI >25 Will recheck weight in 3-6 months  10. Vertigo Rise lowly from sitting to standing Sedation precaution with medication - meclizine (ANTIVERT) 25 MG tablet; Take 1 tablet (25 mg total) by mouth 3 (three) times daily as needed for dizziness.  Dispense: 30 tablet; Refill: 0   Labs pending Health Maintenance reviewed Diet and exercise encouraged  Follow up plan: 3 month   [place], FNP

## 2019-07-31 NOTE — Patient Instructions (Signed)
Carbohydrate Counting for Diabetes Mellitus, Adult  Carbohydrate counting is a method of keeping track of how many carbohydrates you eat. Eating carbohydrates naturally increases the amount of sugar (glucose) in the blood. Counting how many carbohydrates you eat helps keep your blood glucose within normal limits, which helps you manage your diabetes (diabetes mellitus). It is important to know how many carbohydrates you can safely have in each meal. This is different for every person. A diet and nutrition specialist (registered dietitian) can help you make a meal plan and calculate how many carbohydrates you should have at each meal and snack. Carbohydrates are found in the following foods:  Grains, such as breads and cereals.  Dried beans and soy products.  Starchy vegetables, such as potatoes, peas, and corn.  Fruit and fruit juices.  Milk and yogurt.  Sweets and snack foods, such as cake, cookies, candy, chips, and soft drinks. How do I count carbohydrates? There are two ways to count carbohydrates in food. You can use either of the methods or a combination of both. Reading "Nutrition Facts" on packaged food The "Nutrition Facts" list is included on the labels of almost all packaged foods and beverages in the [place] It includes:  The serving size.  Information about nutrients in each serving, including the grams (g) of carbohydrate per serving. To use the "Nutrition Facts":  Decide how many servings you will have.  Multiply the number of servings by the number of carbohydrates per serving.  The resulting number is the total amount of carbohydrates that you will be having. Learning standard serving sizes of other foods When you eat carbohydrate foods that are not packaged or do not include "Nutrition Facts" on the label, you need to measure the servings in order to count the amount of carbohydrates:  Measure the foods that you will eat with a food scale or measuring cup, if  needed.  Decide how many standard-size servings you will eat.  Multiply the number of servings by 15. Most carbohydrate-rich foods have about 15 g of carbohydrates per serving. ? For example, if you eat 8 oz (170 g) of strawberries, you will have eaten 2 servings and 30 g of carbohydrates (2 servings x 15 g = 30 g).  For foods that have more than one food mixed, such as soups and casseroles, you must count the carbohydrates in each food that is included. The following list contains standard serving sizes of common carbohydrate-rich foods. Each of these servings has about 15 g of carbohydrates:   hamburger bun or  English muffin.   oz (15 mL) syrup.   oz (14 g) jelly.  1 slice of bread.  1 six-inch tortilla.  3 oz (85 g) cooked rice or pasta.  4 oz (113 g) cooked dried beans.  4 oz (113 g) starchy vegetable, such as peas, corn, or potatoes.  4 oz (113 g) hot cereal.  4 oz (113 g) mashed potatoes or  of a large baked potato.  4 oz (113 g) canned or frozen fruit.  4 oz (120 mL) fruit juice.  4-6 crackers.  6 chicken nuggets.  6 oz (170 g) unsweetened dry cereal.  6 oz (170 g) plain fat-free yogurt or yogurt sweetened with artificial sweeteners.  8 oz (240 mL) milk.  8 oz (170 g) fresh fruit or one small piece of fruit.  24 oz (680 g) popped popcorn. Example of carbohydrate counting Sample meal  3 oz (85 g) chicken breast.  6 oz (170 g)   brown rice.  4 oz (113 g) corn.  8 oz (240 mL) milk.  8 oz (170 g) strawberries with sugar-free whipped topping. Carbohydrate calculation 1. Identify the foods that contain carbohydrates: ? Rice. ? Corn. ? Milk. ? Strawberries. 2. Calculate how many servings you have of each food: ? 2 servings rice. ? 1 serving corn. ? 1 serving milk. ? 1 serving strawberries. 3. Multiply each number of servings by 15 g: ? 2 servings rice x 15 g = 30 g. ? 1 serving corn x 15 g = 15 g. ? 1 serving milk x 15 g = 15 g. ? 1  serving strawberries x 15 g = 15 g. 4. Add together all of the amounts to find the total grams of carbohydrates eaten: ? 30 g + 15 g + 15 g + 15 g = 75 g of carbohydrates total. Summary  Carbohydrate counting is a method of keeping track of how many carbohydrates you eat.  Eating carbohydrates naturally increases the amount of sugar (glucose) in the blood.  Counting how many carbohydrates you eat helps keep your blood glucose within normal limits, which helps you manage your diabetes.  A diet and nutrition specialist (registered dietitian) can help you make a meal plan and calculate how many carbohydrates you should have at each meal and snack. This information is not intended to replace advice given to you by your health care provider. Make sure you discuss any questions you have with your health care provider. Document Revised: 09/09/2016 Document Reviewed: 07/30/2015 Elsevier Patient Education  2020 Elsevier Inc.  

## 2019-08-01 LAB — CBC WITH DIFFERENTIAL/PLATELET
Basophils Absolute: 0.1 10*3/uL (ref 0.0–0.2)
Basos: 1 %
EOS (ABSOLUTE): 0.1 10*3/uL (ref 0.0–0.4)
Eos: 1 %
Hematocrit: 38.2 % (ref 34.0–46.6)
Hemoglobin: 11.5 g/dL (ref 11.1–15.9)
Immature Grans (Abs): 0.1 10*3/uL (ref 0.0–0.1)
Immature Granulocytes: 1 %
Lymphocytes Absolute: 4.1 10*3/uL — ABNORMAL HIGH (ref 0.7–3.1)
Lymphs: 25 %
MCH: 22.6 pg — ABNORMAL LOW (ref 26.6–33.0)
MCHC: 30.1 g/dL — ABNORMAL LOW (ref 31.5–35.7)
MCV: 75 fL — ABNORMAL LOW (ref 79–97)
Monocytes Absolute: 0.8 10*3/uL (ref 0.1–0.9)
Monocytes: 5 %
Neutrophils Absolute: 11.2 10*3/uL — ABNORMAL HIGH (ref 1.4–7.0)
Neutrophils: 67 %
Platelets: 417 10*3/uL (ref 150–450)
RBC: 5.08 x10E6/uL (ref 3.77–5.28)
RDW: 15 % (ref 11.7–15.4)
WBC: 16.4 10*3/uL — ABNORMAL HIGH (ref 3.4–10.8)

## 2019-08-01 LAB — LIPID PANEL
Chol/HDL Ratio: 5.1 ratio — ABNORMAL HIGH (ref 0.0–4.4)
Cholesterol, Total: 149 mg/dL (ref 100–199)
HDL: 29 mg/dL — ABNORMAL LOW (ref >39)
LDL Chol Calc (NIH): 67 mg/dL (ref 0–99)
Triglycerides: 336 mg/dL — ABNORMAL HIGH (ref 0–149)
VLDL Cholesterol Cal: 53 mg/dL — ABNORMAL HIGH (ref 5–40)

## 2019-08-01 LAB — CMP14+EGFR
ALT: 14 IU/L (ref 0–32)
AST: 17 IU/L (ref 0–40)
Albumin/Globulin Ratio: 1.3 (ref 1.2–2.2)
Albumin: 4 g/dL (ref 3.7–4.7)
Alkaline Phosphatase: 93 IU/L (ref 48–121)
BUN/Creatinine Ratio: 15 (ref 12–28)
BUN: 18 mg/dL (ref 8–27)
Bilirubin Total: 0.3 mg/dL (ref 0.0–1.2)
CO2: 23 mmol/L (ref 20–29)
Calcium: 9.4 mg/dL (ref 8.7–10.3)
Chloride: 99 mmol/L (ref 96–106)
Creatinine, Ser: 1.23 mg/dL — ABNORMAL HIGH (ref 0.57–1.00)
GFR calc Af Amer: 51 mL/min/{1.73_m2} — ABNORMAL LOW (ref >59)
GFR calc non Af Amer: 44 mL/min/{1.73_m2} — ABNORMAL LOW (ref >59)
Globulin, Total: 3.2 g/dL (ref 1.5–4.5)
Glucose: 300 mg/dL — ABNORMAL HIGH (ref 65–99)
Potassium: 4.3 mmol/L (ref 3.5–5.2)
Sodium: 141 mmol/L (ref 134–144)
Total Protein: 7.2 g/dL (ref 6.0–8.5)

## 2019-08-07 ENCOUNTER — Ambulatory Visit: Payer: Self-pay | Admitting: Pharmacist

## 2019-08-17 ENCOUNTER — Other Ambulatory Visit: Payer: Self-pay

## 2019-08-17 ENCOUNTER — Ambulatory Visit (INDEPENDENT_AMBULATORY_CARE_PROVIDER_SITE_OTHER): Payer: Medicare HMO | Admitting: Pharmacist

## 2019-08-17 DIAGNOSIS — E1142 Type 2 diabetes mellitus with diabetic polyneuropathy: Secondary | ICD-10-CM

## 2019-08-17 DIAGNOSIS — G720 Drug-induced myopathy: Secondary | ICD-10-CM | POA: Diagnosis not present

## 2019-08-17 DIAGNOSIS — Z794 Long term (current) use of insulin: Secondary | ICD-10-CM | POA: Diagnosis not present

## 2019-08-17 MED ORDER — DAPAGLIFLOZIN PROPANEDIOL 5 MG PO TABS: 5.0000 mg | ORAL_TABLET | Freq: Every day | ORAL | 3 refills | Status: DC

## 2019-08-17 NOTE — Progress Notes (Signed)
     08/17/2019 Name: Alexandra Hunt MRN: [id number] DOB: 1945-10-22   S:  26 yoF presents for diabetes evaluation, education, and management Patient was referred and last seen by Primary Care Provider on 07/31/19.  Insurance coverage/medication affordability: [employer]are  Patient denies adherence with medications. . Current diabetes medications include: toujeo, novolin R, metformin (does not take as prescribed), farxiga . Current hypertension medications include: lisinopril Goal 130/80 . Current hyperlipidemia medications include: fenofibrate o Patient with myaglias to statins, intolerance documented in the EMR, DX: G72   Patient denies hypoglycemic events.   Patient reported dietary habits: Eats 2-3 meals/day  Discussed meal planning options and Plate method for healthy eating  Avoid sugary drinks and desserts  Incorporate balanced protein, non starchy veggies, 1 serving of carbohydrate  Increase water intake  Increase physical activity as able.  Patient-reported exercise habits: n/a   O:  Lab Results  Component Value Date   HGBA1C 9.2 (H) 07/31/2019    Lipid Panel     Component Value Date/Time   CHOL 149 07/31/2019 1636   CHOL 145 08/30/2012 1422   TRIG 336 (H) 07/31/2019 1636   TRIG 314 (H) 10/05/2013 1526   TRIG 184 (H) 08/30/2012 1422   HDL 29 (L) 07/31/2019 1636   HDL 30 (L) 10/05/2013 1526   HDL 26 (L) 08/30/2012 1422   CHOLHDL 5.1 (H) 07/31/2019 1636   LDLCALC 67 07/31/2019 1636   LDLCALC 68 10/05/2013 1526   LDLCALC 82 08/30/2012 1422    Home fasting blood sugars: 169  2 hour post-meal/random blood sugars: n/a.    A/P:  Diabetes T2DM currently UNCONTROLLED. Patient is able to verbalize appropriate hypoglycemia management plan. Patient is not adherent with medication. Control is suboptimal due to diet/lifestyle.  -Continued basal insulin Toujeo  -Continue Novolin R -- inject 22 units 4 times a day per sliding scale  -Continue  Metformin--> she reports not taking this as prescribed  -Patient to benefit from [place] 2 CGM.  Will start approval process.  Will use [employer] (fax [phone], p [phone])  LIBRE 2 - 2 WEEK SAMPLE PLACED ON PATIENT'S ARM  LOT #[id number], EXP 11/23   -Continued SGLT2-I Farxiga (generic name dapagliflozin)   Counseled Sick day rules: if sick, vomiting, have diarrhea, or   cannot drink enough fluids, you should stop taking SGLT-2 inhibitors until your symptoms go away. In rare cases, these medicines can cause diabetic ketoacidosis (DKA). DKA is acid buildup in the  blood. -Extensively discussed pathophysiology of diabetes, recommended lifestyle interventions, dietary effects on blood sugar control  -Counseled on s/sx of and management of hypoglycemia  -Next A1C anticipated (3-6 months) around 10/2019.    Written patient instructions provided.  Total time in face to face counseling 30 minutes.   Follow up Pharmacist Clinic Visit in 6-8 weeks  Regina Eck, PharmD, BCPS Clinical Pharmacist, [place]  II Phone [phone]

## 2019-08-30 ENCOUNTER — Other Ambulatory Visit: Payer: Self-pay

## 2019-08-30 MED ORDER — BD PEN NEEDLE MINI U/F 31G X 5 MM MISC: 3 refills | Status: DC

## 2019-08-31 ENCOUNTER — Other Ambulatory Visit: Payer: Self-pay

## 2019-08-31 MED ORDER — "INSULIN SYRINGE-NEEDLE U-100 31G X 5/16"" 1 ML MISC": 10 refills | Status: DC

## 2019-09-01 DIAGNOSIS — M778 Other enthesopathies, not elsewhere classified: Secondary | ICD-10-CM | POA: Diagnosis not present

## 2019-09-01 DIAGNOSIS — M79645 Pain in left finger(s): Secondary | ICD-10-CM | POA: Diagnosis not present

## 2019-09-01 DIAGNOSIS — J0100 Acute maxillary sinusitis, unspecified: Secondary | ICD-10-CM | POA: Diagnosis not present

## 2019-09-04 ENCOUNTER — Encounter: Payer: Self-pay | Admitting: Pharmacist

## 2019-09-04 DIAGNOSIS — G720 Drug-induced myopathy: Secondary | ICD-10-CM | POA: Insufficient documentation

## 2019-09-06 ENCOUNTER — Telehealth: Payer: Self-pay | Admitting: Pharmacist

## 2019-09-06 DIAGNOSIS — E1142 Type 2 diabetes mellitus with diabetic polyneuropathy: Secondary | ICD-10-CM

## 2019-09-06 NOTE — Telephone Encounter (Signed)
Libre denied by [employer]; will try for [facility] for approval  Paperwork faxed to [place]

## 2019-09-07 ENCOUNTER — Telehealth: Payer: Self-pay | Admitting: Nurse Practitioner

## 2019-09-07 MED ORDER — FREESTYLE LIBRE 2 SENSOR MISC: 4 refills | Status: DC

## 2019-09-07 MED ORDER — FREESTYLE LIBRE 2 READER DEVI: 0 refills | Status: DC

## 2019-09-07 MED ORDER — FREESTYLE LIBRE 2 READER DEVI: 0 refills | Status: AC

## 2019-09-07 NOTE — Telephone Encounter (Signed)
Determined that patient's insurance [facility]) requires vendor: [employer] to fulfill CGM  Will fax CGM packet to [place]

## 2019-09-07 NOTE — Addendum Note (Signed)
Addended by: Lottie Dawson D on: 09/07/2019 11:34 AM   Modules accepted: Orders

## 2019-09-07 NOTE — Addendum Note (Signed)
Addended by: Lottie Dawson D on: 09/07/2019 11:45 AM   Modules accepted: Orders

## 2019-09-10 NOTE — Telephone Encounter (Signed)
Call placed to patient to follow up  VM was full and not accepting messages  Will attempt call in 3 days

## 2019-09-18 NOTE — Telephone Encounter (Addendum)
Patient's mail box is full and unable to leave message Home phone # is also not working   Call placed to [place] (for [employer] CGM system) who has also tried to outreach patient  Patient needs to call [phone] to verify insurance information

## 2019-09-27 ENCOUNTER — Other Ambulatory Visit: Payer: Self-pay | Admitting: Nurse Practitioner

## 2019-09-27 ENCOUNTER — Telehealth: Payer: Self-pay | Admitting: Nurse Practitioner

## 2019-09-27 NOTE — Telephone Encounter (Signed)
Left VM on patient's phone requesting return call (mobile)

## 2019-10-08 DIAGNOSIS — H2513 Age-related nuclear cataract, bilateral: Secondary | ICD-10-CM | POA: Diagnosis not present

## 2019-10-08 DIAGNOSIS — H25013 Cortical age-related cataract, bilateral: Secondary | ICD-10-CM | POA: Diagnosis not present

## 2019-10-08 DIAGNOSIS — H353132 Nonexudative age-related macular degeneration, bilateral, intermediate dry stage: Secondary | ICD-10-CM | POA: Diagnosis not present

## 2019-10-08 DIAGNOSIS — H40023 Open angle with borderline findings, high risk, bilateral: Secondary | ICD-10-CM | POA: Diagnosis not present

## 2019-10-08 LAB — HM DIABETES EYE EXAM

## 2019-10-26 ENCOUNTER — Telehealth: Payer: Self-pay | Admitting: Nurse Practitioner

## 2019-10-31 ENCOUNTER — Telehealth: Payer: Self-pay | Admitting: Pharmacist

## 2019-10-31 MED ORDER — ALBUTEROL SULFATE HFA 108 (90 BASE) MCG/ACT IN AERS
2.0000 | INHALATION_SPRAY | Freq: Four times a day (QID) | RESPIRATORY_TRACT | 2 refills | Status: DC | PRN
Start: 2019-10-31 — End: 2021-04-09

## 2019-10-31 MED ORDER — DAPAGLIFLOZIN PROPANEDIOL 10 MG PO TABS: 10.0000 mg | ORAL_TABLET | Freq: Every day | ORAL | 11 refills | Status: DC

## 2019-10-31 NOTE — Telephone Encounter (Signed)
patient requesting handicap pass  Thank you!

## 2019-10-31 NOTE — Telephone Encounter (Signed)
Restart farxiga 10mg  daily [id number], EXP 2/24 #14 tabs Samples left up front  RX for Farxiga and Albuterol inhaler to be signed by PCP then faxed to health dept  Will try to get patient CGM/libre approved.  Patient must answer the phone to receive product

## 2019-10-31 NOTE — Telephone Encounter (Signed)
Form printed and placed on provider's desk

## 2019-11-01 NOTE — Telephone Encounter (Signed)
Patient aware form is ready for pick up.

## 2019-11-02 ENCOUNTER — Other Ambulatory Visit: Payer: Self-pay

## 2019-11-02 DIAGNOSIS — Z780 Asymptomatic menopausal state: Secondary | ICD-10-CM

## 2019-11-02 DIAGNOSIS — R7989 Other specified abnormal findings of blood chemistry: Secondary | ICD-10-CM

## 2019-11-02 DIAGNOSIS — M8580 Other specified disorders of bone density and structure, unspecified site: Secondary | ICD-10-CM

## 2019-11-15 ENCOUNTER — Telehealth: Payer: Self-pay | Admitting: Nurse Practitioner

## 2019-11-26 DIAGNOSIS — E119 Type 2 diabetes mellitus without complications: Secondary | ICD-10-CM | POA: Diagnosis not present

## 2019-11-27 NOTE — Telephone Encounter (Signed)
RX was signed on the original [facility]ent to [facility]

## 2019-12-13 DIAGNOSIS — M79642 Pain in left hand: Secondary | ICD-10-CM | POA: Insufficient documentation

## 2019-12-13 DIAGNOSIS — E119 Type 2 diabetes mellitus without complications: Secondary | ICD-10-CM | POA: Diagnosis not present

## 2019-12-13 DIAGNOSIS — M65332 Trigger finger, left middle finger: Secondary | ICD-10-CM | POA: Diagnosis not present

## 2019-12-27 DIAGNOSIS — E119 Type 2 diabetes mellitus without complications: Secondary | ICD-10-CM | POA: Diagnosis not present

## 2020-01-04 DIAGNOSIS — H524 Presbyopia: Secondary | ICD-10-CM | POA: Diagnosis not present

## 2020-01-04 DIAGNOSIS — H52209 Unspecified astigmatism, unspecified eye: Secondary | ICD-10-CM | POA: Diagnosis not present

## 2020-01-04 DIAGNOSIS — H5203 Hypermetropia, bilateral: Secondary | ICD-10-CM | POA: Diagnosis not present

## 2020-01-10 DIAGNOSIS — M79642 Pain in left hand: Secondary | ICD-10-CM | POA: Diagnosis not present

## 2020-01-10 DIAGNOSIS — M65332 Trigger finger, left middle finger: Secondary | ICD-10-CM | POA: Diagnosis not present

## 2020-01-10 DIAGNOSIS — M1711 Unilateral primary osteoarthritis, right knee: Secondary | ICD-10-CM | POA: Diagnosis not present

## 2020-01-10 DIAGNOSIS — M79641 Pain in right hand: Secondary | ICD-10-CM | POA: Diagnosis not present

## 2020-02-11 ENCOUNTER — Other Ambulatory Visit: Payer: Self-pay | Admitting: *Deleted

## 2020-02-11 DIAGNOSIS — F411 Generalized anxiety disorder: Secondary | ICD-10-CM

## 2020-02-14 ENCOUNTER — Other Ambulatory Visit: Payer: Self-pay

## 2020-02-14 ENCOUNTER — Telehealth: Payer: Self-pay

## 2020-02-14 DIAGNOSIS — F411 Generalized anxiety disorder: Secondary | ICD-10-CM

## 2020-02-15 ENCOUNTER — Telehealth: Payer: Self-pay

## 2020-02-15 ENCOUNTER — Other Ambulatory Visit: Payer: Self-pay

## 2020-02-15 ENCOUNTER — Ambulatory Visit: Payer: Medicare HMO | Admitting: Nurse Practitioner

## 2020-02-15 DIAGNOSIS — I10 Essential (primary) hypertension: Secondary | ICD-10-CM

## 2020-02-15 DIAGNOSIS — Z794 Long term (current) use of insulin: Secondary | ICD-10-CM

## 2020-02-15 DIAGNOSIS — R7989 Other specified abnormal findings of blood chemistry: Secondary | ICD-10-CM

## 2020-02-15 DIAGNOSIS — E782 Mixed hyperlipidemia: Secondary | ICD-10-CM

## 2020-02-15 DIAGNOSIS — F411 Generalized anxiety disorder: Secondary | ICD-10-CM

## 2020-02-15 MED ORDER — DAPAGLIFLOZIN PROPANEDIOL 5 MG PO TABS: 5.0000 mg | ORAL_TABLET | Freq: Every day | ORAL | 0 refills | Status: DC

## 2020-02-15 MED ORDER — LORAZEPAM 1 MG PO TABS: 1.0000 mg | ORAL_TABLET | Freq: Three times a day (TID) | ORAL | 0 refills | Status: DC

## 2020-02-15 NOTE — Telephone Encounter (Signed)
Patient needs a refill on Lorazepam and also farxiga 5mg . Cannot tolerate 10mg  and would like to change it back. Please advise. Has appt on January 4th

## 2020-02-15 NOTE — Telephone Encounter (Signed)
Attempted to contact patient - NVM 

## 2020-02-15 NOTE — Telephone Encounter (Signed)
Follo w ups have to be done in person because pateint will need to get blood work.

## 2020-02-21 NOTE — Telephone Encounter (Signed)
See telephone encounter from 02/15/20, will close encounter.

## 2020-03-04 ENCOUNTER — Ambulatory Visit: Payer: Medicare HMO | Admitting: Nurse Practitioner

## 2020-03-04 ENCOUNTER — Ambulatory Visit (INDEPENDENT_AMBULATORY_CARE_PROVIDER_SITE_OTHER): Payer: Medicare HMO | Admitting: Nurse Practitioner

## 2020-03-04 ENCOUNTER — Other Ambulatory Visit: Payer: Self-pay

## 2020-03-04 ENCOUNTER — Encounter: Payer: Self-pay | Admitting: Nurse Practitioner

## 2020-03-04 VITALS — BP 115/54 | HR 104 | Temp 96.5°F | Resp 20 | Ht 64.0 in | Wt 199.0 lb

## 2020-03-04 DIAGNOSIS — I10 Essential (primary) hypertension: Secondary | ICD-10-CM

## 2020-03-04 DIAGNOSIS — G4733 Obstructive sleep apnea (adult) (pediatric): Secondary | ICD-10-CM | POA: Diagnosis not present

## 2020-03-04 DIAGNOSIS — Z23 Encounter for immunization: Secondary | ICD-10-CM | POA: Diagnosis not present

## 2020-03-04 DIAGNOSIS — Z794 Long term (current) use of insulin: Secondary | ICD-10-CM

## 2020-03-04 DIAGNOSIS — F339 Major depressive disorder, recurrent, unspecified: Secondary | ICD-10-CM | POA: Diagnosis not present

## 2020-03-04 DIAGNOSIS — Z6832 Body mass index (BMI) 32.0-32.9, adult: Secondary | ICD-10-CM

## 2020-03-04 DIAGNOSIS — M8580 Other specified disorders of bone density and structure, unspecified site: Secondary | ICD-10-CM | POA: Diagnosis not present

## 2020-03-04 DIAGNOSIS — E1142 Type 2 diabetes mellitus with diabetic polyneuropathy: Secondary | ICD-10-CM

## 2020-03-04 DIAGNOSIS — E782 Mixed hyperlipidemia: Secondary | ICD-10-CM

## 2020-03-04 DIAGNOSIS — R7989 Other specified abnormal findings of blood chemistry: Secondary | ICD-10-CM | POA: Diagnosis not present

## 2020-03-04 DIAGNOSIS — F411 Generalized anxiety disorder: Secondary | ICD-10-CM | POA: Diagnosis not present

## 2020-03-04 DIAGNOSIS — N393 Stress incontinence (female) (male): Secondary | ICD-10-CM

## 2020-03-04 LAB — BAYER DCA HB A1C WAIVED: HB A1C (BAYER DCA - WAIVED): 9 % — ABNORMAL HIGH (ref <7.0)

## 2020-03-04 MED ORDER — INSULIN REGULAR HUMAN 100 UNIT/ML IJ SOLN: INTRAMUSCULAR | 2 refills | Status: DC

## 2020-03-04 MED ORDER — FENOFIBRATE 160 MG PO TABS
160.0000 mg | ORAL_TABLET | Freq: Every day | ORAL | 1 refills | Status: DC
Start: 2020-03-04 — End: 2020-11-28

## 2020-03-04 MED ORDER — FUROSEMIDE 20 MG PO TABS: ORAL_TABLET | ORAL | 1 refills | Status: DC

## 2020-03-04 MED ORDER — LISINOPRIL 20 MG PO TABS
20.0000 mg | ORAL_TABLET | Freq: Every day | ORAL | 1 refills | Status: DC
Start: 2020-03-04 — End: 2020-06-23

## 2020-03-04 MED ORDER — METFORMIN HCL 500 MG PO TABS: 500.0000 mg | ORAL_TABLET | Freq: Two times a day (BID) | ORAL | 1 refills | Status: DC

## 2020-03-04 MED ORDER — TOUJEO SOLOSTAR 300 UNIT/ML ~~LOC~~ SOPN: 85.0000 [IU] | PEN_INJECTOR | Freq: Two times a day (BID) | SUBCUTANEOUS | 5 refills | Status: DC

## 2020-03-04 MED ORDER — LORAZEPAM 1 MG PO TABS: 1.0000 mg | ORAL_TABLET | Freq: Three times a day (TID) | ORAL | 2 refills | Status: DC

## 2020-03-04 MED ORDER — TOLTERODINE TARTRATE ER 4 MG PO CP24: 4.0000 mg | ORAL_CAPSULE | Freq: Every day | ORAL | 3 refills | Status: DC

## 2020-03-04 MED ORDER — TOUJEO SOLOSTAR 300 UNIT/ML ~~LOC~~ SOPN
80.0000 [IU] | PEN_INJECTOR | Freq: Two times a day (BID) | SUBCUTANEOUS | 5 refills | Status: DC
Start: 2020-03-04 — End: 2020-03-04

## 2020-03-04 MED ORDER — SERTRALINE HCL 100 MG PO TABS: 200.0000 mg | ORAL_TABLET | Freq: Every day | ORAL | 1 refills | Status: DC

## 2020-03-04 NOTE — Patient Instructions (Signed)
Diabetes Mellitus and Foot Care Foot care is an important part of your health, especially when you have diabetes. Diabetes may cause you to have problems because of poor blood flow (circulation) to your feet and legs, which can cause your skin to:  Become thinner and drier.  Break more easily.  Heal more slowly.  Peel and crack. You may also have nerve damage (neuropathy) in your legs and feet, causing decreased feeling in them. This means that you may not notice minor injuries to your feet that could lead to more serious problems. Noticing and addressing any potential problems early is the best way to prevent future foot problems. How to care for your feet Foot hygiene  Wash your feet daily with warm water and mild soap. Do not use hot water. Then, pat your feet and the areas between your toes until they are completely dry. Do not soak your feet as this can dry your skin.  Trim your toenails straight across. Do not dig under them or around the cuticle. File the edges of your nails with an emery board or nail file.  Apply a moisturizing lotion or petroleum jelly to the skin on your feet and to dry, brittle toenails. Use lotion that does not contain alcohol and is unscented. Do not apply lotion between your toes. Shoes and socks  Wear clean socks or stockings every day. Make sure they are not too tight. Do not wear knee-high stockings since they may decrease blood flow to your legs.  Wear shoes that fit properly and have enough cushioning. Always look in your shoes before you put them on to be sure there are no objects inside.  To break in new shoes, wear them for just a few hours a day. This prevents injuries on your feet. Wounds, scrapes, corns, and calluses  Check your feet daily for blisters, cuts, bruises, sores, and redness. If you cannot see the bottom of your feet, use a mirror or ask someone for help.  Do not cut corns or calluses or try to remove them with medicine.  If you  find a minor scrape, cut, or break in the skin on your feet, keep it and the skin around it clean and dry. You may clean these areas with mild soap and water. Do not clean the area with peroxide, alcohol, or iodine.  If you have a wound, scrape, corn, or callus on your foot, look at it several times a day to make sure it is healing and not infected. Check for: ? Redness, swelling, or pain. ? Fluid or blood. ? Warmth. ? Pus or a bad smell. General instructions  Do not cross your legs. This may decrease blood flow to your feet.  Do not use heating pads or hot water bottles on your feet. They may burn your skin. If you have lost feeling in your feet or legs, you may not know this is happening until it is too late.  Protect your feet from hot and cold by wearing shoes, such as at the beach or on hot pavement.  Schedule a complete foot exam at least once a year (annually) or more often if you have foot problems. If you have foot problems, report any cuts, sores, or bruises to your health care provider immediately. Contact a health care provider if:  You have a medical condition that increases your risk of infection and you have any cuts, sores, or bruises on your feet.  You have an injury that is not   healing.  You have redness on your legs or feet.  You feel burning or tingling in your legs or feet.  You have pain or cramps in your legs and feet.  Your legs or feet are numb.  Your feet always feel cold.  You have pain around a toenail. Get help right away if:  You have a wound, scrape, corn, or callus on your foot and: ? You have pain, swelling, or redness that gets worse. ? You have fluid or blood coming from the wound, scrape, corn, or callus. ? Your wound, scrape, corn, or callus feels warm to the touch. ? You have pus or a bad smell coming from the wound, scrape, corn, or callus. ? You have a fever. ? You have a red line going up your leg. Summary  Check your feet every day  for cuts, sores, red spots, swelling, and blisters.  Moisturize feet and legs daily.  Wear shoes that fit properly and have enough cushioning.  If you have foot problems, report any cuts, sores, or bruises to your health care provider immediately.  Schedule a complete foot exam at least once a year (annually) or more often if you have foot problems. This information is not intended to replace advice given to you by your health care provider. Make sure you discuss any questions you have with your health care provider. Document Revised: 11/08/2018 Document Reviewed: 03/19/2016 Elsevier Patient Education  2020 Elsevier Inc.  

## 2020-03-04 NOTE — Progress Notes (Signed)
Subjective:    Patient ID: Alexandra Hunt, female    DOB: 07/31/1945, 75 y.o.   MRN: [id number]   Chief Complaint: Medical Management of Chronic Issues (SOB)    HPI:  1. Essential hypertension, malignant No c/o chest pain, sob or headache. Does not check blood pressure at home. BP Readings from Last 3 Encounters:  03/04/20 (!) 115/54  07/31/19 (!) 111/58  05/05/18 128/63     2. Mixed hyperlipidemia Does not watch diet and does little to no exercise. Lab Results  Component Value Date   CHOL 149 07/31/2019   HDL 29 (L) 07/31/2019   LDLCALC 67 07/31/2019   TRIG 336 (H) 07/31/2019   CHOLHDL 5.1 (H) 07/31/2019    3. Type 2 diabetes mellitus with diabetic polyneuropathy, with long-term current use of insulin (HCC) Fasting blood sugars have been ranging from 58-2230. She has not been watching diet. Not always compliant with all of her meds.  Lab Results  Component Value Date   HGBA1C 9.2 (H) 07/31/2019     4. Low serum vitamin D Is on daily vitamin d supplement  5. Obstructive sleep apnea syndrome Says her cpap is broken and she has not been using it  6. Osteopenia, unspecified location She does not weight bearing exercises. Her ;last dexascan was done in 2017. Will repeat today  7. Episode of recurrent major depressive disorder, unspecified depression episode severity ([place]) Is on zoloft, but has not been doing well the last month. Her husband died in 02/22/2023 of last year. Depression screen [facility] 2/9 03/04/2020 07/31/2019 04/10/2019  Decreased Interest 3 0 2  Down, Depressed, Hopeless 3 0 3  PHQ - 2 Score 6 0 5  Altered sleeping 3 0 2  Tired, decreased energy 3 0 2  Change in appetite 1 0 0  Feeling bad or failure about yourself  1 0 0  Trouble concentrating 0 0 0  Moving slowly or fidgety/restless 0 0 0  Suicidal thoughts 0 0 0  PHQ-9 Score 14 0 9  Difficult doing work/chores Not difficult at all - Somewhat difficult  Some recent data might be hidden     8. GAD  (generalized anxiety disorder) Is on ativan 3x a day. Stays anxious  GAD 7 : Generalized Anxiety Score 03/04/2020 07/31/2019 04/10/2019 11/09/2017  Nervous, Anxious, on Edge 3 3 3 2   Control/stop worrying 3 3 3 3   Worry too much - different things 3 3 2 3   Trouble relaxing 0 3 0 2  Restless 0 2 0 2  Easily annoyed or irritable 1 3 0 3  Afraid - awful might happen 1 0 0 3  Total GAD 7 Score 11 17 8 18   Anxiety Difficulty Somewhat difficult - Somewhat difficult -       9. BMI 32.0-32.9,adult No recent weight changes Wt Readings from Last 3 Encounters:  03/04/20 199 lb (90.3 kg)  07/31/19 201 lb (91.2 kg)  05/05/18 206 lb (93.4 kg)   BMI Readings from Last 3 Encounters:  03/04/20 34.16 kg/m  07/31/19 34.50 kg/m  05/05/18 35.36 kg/m       Outpatient Encounter Medications as of 03/04/2020  Medication Sig  . albuterol (VENTOLIN HFA) 108 (90 Base) MCG/ACT inhaler Inhale 2 puffs into the lungs every 6 (six) hours as needed for wheezing or shortness of breath.  . Alcohol Swabs (B-D SINGLE USE SWABS REGULAR) PADS TEST BS UP TO FOUR TIMES DAILY Dx E11.9  . aspirin 81 MG tablet Take 81 mg  by mouth daily.  . Blood Glucose Calibration (TRUE METRIX LEVEL 2) Normal SOLN Use with glucose machine Dx E11.9  . Blood Glucose Monitoring Suppl (TRUE METRIX AIR GLUCOSE METER) w/Device KIT TEST BS UP TO FOUR TIMES DAILY Dx E11.9  . Continuous Blood Gluc Receiver (FREESTYLE LIBRE 2 READER) DEVI Use to test blood sugar 4-6 times daily as directed DX: E11.9  . Continuous Blood Gluc Sensor (FREESTYLE LIBRE 2 SENSOR) MISC Use to test blood sugar 4-6 times daily as directed DX: E11.9  . dapagliflozin propanediol (FARXIGA) 5 MG TABS tablet Take 1 tablet (5 mg total) by mouth daily.  . diclofenac (VOLTAREN) 75 MG EC tablet TAKE 1 TABLET BY MOUTH TWICE A DAY AS NEEDED  . dorzolamide (TRUSOPT) 2 % ophthalmic solution Place 2 drops into both eyes 2 (two) times daily.  . fenofibrate 160 MG tablet Take 1 tablet  (160 mg total) by mouth daily.  . fluticasone (FLONASE) 50 MCG/ACT nasal spray Place 2 sprays into both nostrils daily.  . furosemide (LASIX) 20 MG tablet TAKE 1 TABLET BY MOUTH EVERY DAY  . glucose blood (TRUE METRIX BLOOD GLUCOSE TEST) test strip TEST BS UP TO FOUR TIMES DAILY Dx E11.9  . ibuprofen (ADVIL,MOTRIN) 800 MG tablet TAKE 1 TABLET BY MOUTH EVERY 8 HOURS AS NEEDED  . Insulin Glargine, 1 Unit Dial, (TOUJEO SOLOSTAR) 300 UNIT/ML SOPN Inject 80 Units into the skin 2 (two) times a day.  . Insulin Pen Needle (B-D UF III MINI PEN NEEDLES) 31G X 5 MM MISC USE WITH SLIDING SCALE HUMALOG 4 TIMES DAILY DX: E11.9  . insulin regular (NOVOLIN R) 100 units/mL injection INJECT 22UNITS 4 TIMES A DAY PER SLIDING SCALE  . Insulin Syringe-Needle U-100 (B-D INS SYR ULTRAFINE 1CC/31G) 31G X 5/16" 1 ML MISC USE DAILY WITH LANTUS AND HUMALOG INSULIN SLIDING SCALES AS INSTRUCTED  . Lancet Device MISC USE TO CHECK BLOOD SUGAR TWICE DAILY AND AS NEEDED  E11.9  . lisinopril (ZESTRIL) 20 MG tablet Take 1 tablet (20 mg total) by mouth daily.  Marland Kitchen LORazepam (ATIVAN) 1 MG tablet Take 1 tablet (1 mg total) by mouth 3 (three) times daily.  . magic mouthwash SOLN Take 5 mLs by mouth 4 (four) times daily.  . meclizine (ANTIVERT) 25 MG tablet Take 1 tablet (25 mg total) by mouth 3 (three) times daily as needed for dizziness.  . metFORMIN (GLUCOPHAGE) 500 MG tablet Take 1 tablet (500 mg total) by mouth 2 (two) times daily with a meal.  . mupirocin cream (BACTROBAN) 2 % Apply 1 application topically 2 (two) times daily.  . ondansetron (ZOFRAN) 4 MG tablet   . sertraline (ZOLOFT) 100 MG tablet Take 2 tablets (200 mg total) by mouth daily.  . silver sulfADIAZINE (SILVADENE) 1 % cream Apply 1 application topically 2 (two) times daily.  . TRUEplus Lancets 30G MISC TEST BS UP TO FOUR TIMES DAILY Dx E11.9  . Vitamin D, Ergocalciferol, (DRISDOL) 1.25 MG (50000 UNIT) CAPS capsule Take 1 capsule (50,000 Units total) by mouth every 7  (seven) days.   No facility-administered encounter medications on file as of 03/04/2020.    Past Surgical History:  Procedure Laterality Date  . ABDOMINAL HYSTERECTOMY     due to uterine cancer  . BREAST SURGERY     left breast biopsy/benign  . TUBAL LIGATION      Family History  Problem Relation Age of Onset  . Diabetes Mother   . Blindness Mother  related to diabetes  . Stroke Mother   . Heart disease Father        MI  . Heart attack Father   . Heart defect Father   . Breast cancer Sister   . Diabetes Brother        diet controlled  . Colon cancer Neg Hx   . Esophageal cancer Neg Hx   . Rectal cancer Neg Hx   . Stomach cancer Neg Hx     New complaints: Urinary incontinence- seems to be getting worse. Has to wear pads all the time.  Social history: She has a son and daughter living with her  Controlled substance contract: 08/03/19    Review of Systems  Constitutional: Negative for diaphoresis.  Eyes: Negative for pain.  Respiratory: Negative for shortness of breath.   Cardiovascular: Negative for chest pain, palpitations and leg swelling.  Gastrointestinal: Negative for abdominal pain.  Endocrine: Negative for polydipsia.  Skin: Negative for rash.  Neurological: Negative for dizziness, weakness and headaches.  Hematological: Does not bruise/bleed easily.  All other systems reviewed and are negative.      Objective:   Physical Exam Vitals and nursing note reviewed.  Constitutional:      General: She is not in acute distress.    Appearance: Normal appearance. She is well-developed and well-nourished.  HENT:     Head: Normocephalic.     Nose: Nose normal.     Mouth/Throat:     Mouth: Oropharynx is clear and moist.  Eyes:     Extraocular Movements: EOM normal.     Pupils: Pupils are equal, round, and reactive to light.  Neck:     Vascular: No carotid bruit or JVD.  Cardiovascular:     Rate and Rhythm: Normal rate and regular rhythm.      Pulses: Intact distal pulses.     Heart sounds: Normal heart sounds.  Pulmonary:     Effort: Pulmonary effort is normal. No respiratory distress.     Breath sounds: Normal breath sounds. No wheezing or rales.  Chest:     Chest wall: No tenderness.  Abdominal:     General: Bowel sounds are normal. There is no distension or abdominal bruit. Aorta is normal.     Palpations: Abdomen is soft. There is no hepatomegaly, splenomegaly, mass or pulsatile mass.     Tenderness: There is no abdominal tenderness.  Musculoskeletal:        General: No edema. Normal range of motion.     Cervical back: Normal range of motion and neck supple.  Lymphadenopathy:     Cervical: No cervical adenopathy.  Skin:    General: Skin is warm and dry.  Neurological:     Mental Status: She is alert and oriented to person, place, and time.     Deep Tendon Reflexes: Reflexes are normal and symmetric.  Psychiatric:        Mood and Affect: Mood and affect normal.        Behavior: Behavior normal.        Thought Content: Thought content normal.        Judgment: Judgment normal.     BP (!) 115/54   Pulse (!) 104   Temp (!) 96.5 F (35.8 C) (Temporal)   Resp 20   Ht $R'5\' 4"'IS$  (1.626 m)   Wt 199 lb (90.3 kg)   BMI 34.16 kg/m   HGBA1c 9.0%      Assessment & Plan:  Alexandra Hunt comes in  today with chief complaint of Medical Management of Chronic Issues (SOB)   Diagnosis and orders addressed:  1. Essential hypertension, malignant Low sodium diet - CMP14+EGFR - CBC with Differential/Platelet - lisinopril (ZESTRIL) 20 MG tablet; Take 1 tablet (20 mg total) by mouth daily.  Dispense: 90 tablet; Refill: 1 - furosemide (LASIX) 20 MG tablet; TAKE 1 TABLET BY MOUTH EVERY DAY  Dispense: 90 tablet; Refill: 1  2. Mixed hyperlipidemia Low fat diet - Lipid panel - fenofibrate 160 MG tablet; Take 1 tablet (160 mg total) by mouth daily.  Dispense: 90 tablet; Refill: 1  3. Type 2 diabetes mellitus with diabetic  polyneuropathy, with long-term current use of insulin (HCC) Increase touh=jeon to 85u bid - Bayer DCA Hb A1c Waived - metFORMIN (GLUCOPHAGE) 500 MG tablet; Take 1 tablet (500 mg total) by mouth 2 (two) times daily with a meal.  Dispense: 180 tablet; Refill: 1 - insulin glargine, 1 Unit Dial, (TOUJEO SOLOSTAR) 300 UNIT/ML Solostar Pen; Inject 85 Units into the skin in the morning and at bedtime.  Dispense: 12 mL; Refill: 5 - insulin regular (NOVOLIN R) 100 units/mL injection; INJECT 22UNITS 4 TIMES A DAY PER SLIDING SCALE  Dispense: 30 mL; Refill: 2  4. Low serum vitamin D Continue vitamind supplement - VITAMIN D 25 Hydroxy (Vit-D Deficiency, Fractures)  5. Obstructive sleep apnea syndrome Get cpap machine fixed so you can start using it nightly  6. Osteopenia, unspecified location dexascan result spending Weight bearing exercise encouraged  7. Episode of recurrent major depressive disorder, unspecified depression episode severity ([place]) Stress management - sertraline (ZOLOFT) 100 MG tablet; Take 2 tablets (200 mg total) by mouth daily.  Dispense: 180 tablet; Refill: 1  8. GAD (generalized anxiety disorder) - LORazepam (ATIVAN) 1 MG tablet; Take 1 tablet (1 mg total) by mouth 3 (three) times daily.  Dispense: 90 tablet; Refill: 2  9. BMI 32.0-32.9,adult Discussed diet and exercise for person with BMI >25 Will recheck weight in 3-6 months  10. Urinary incontinence detrol LA added to meds kegle exercises encouraged.   Labs pending Health Maintenance reviewed Diet and exercise encouraged  Follow up plan: 3 months   Mary-Margaret Hassell Done, FNP

## 2020-03-05 ENCOUNTER — Ambulatory Visit (INDEPENDENT_AMBULATORY_CARE_PROVIDER_SITE_OTHER): Payer: Medicare HMO | Admitting: *Deleted

## 2020-03-05 DIAGNOSIS — Z0000 Encounter for general adult medical examination without abnormal findings: Secondary | ICD-10-CM | POA: Diagnosis not present

## 2020-03-05 LAB — CBC WITH DIFFERENTIAL/PLATELET
Basophils Absolute: 0.1 10*3/uL (ref 0.0–0.2)
Basos: 1 %
EOS (ABSOLUTE): 0.2 10*3/uL (ref 0.0–0.4)
Eos: 1 %
Hematocrit: 38.3 % (ref 34.0–46.6)
Hemoglobin: 11.5 g/dL (ref 11.1–15.9)
Immature Grans (Abs): 0.1 10*3/uL (ref 0.0–0.1)
Immature Granulocytes: 1 %
Lymphocytes Absolute: 3.4 10*3/uL — ABNORMAL HIGH (ref 0.7–3.1)
Lymphs: 19 %
MCH: 21.7 pg — ABNORMAL LOW (ref 26.6–33.0)
MCHC: 30 g/dL — ABNORMAL LOW (ref 31.5–35.7)
MCV: 72 fL — ABNORMAL LOW (ref 79–97)
Monocytes Absolute: 0.9 10*3/uL (ref 0.1–0.9)
Monocytes: 5 %
Neutrophils Absolute: 12.9 10*3/uL — ABNORMAL HIGH (ref 1.4–7.0)
Neutrophils: 73 %
Platelets: 484 10*3/uL — ABNORMAL HIGH (ref 150–450)
RBC: 5.31 x10E6/uL — ABNORMAL HIGH (ref 3.77–5.28)
RDW: 15.4 % (ref 11.7–15.4)
WBC: 17.6 10*3/uL — ABNORMAL HIGH (ref 3.4–10.8)

## 2020-03-05 LAB — CMP14+EGFR
ALT: 12 IU/L (ref 0–32)
AST: 19 IU/L (ref 0–40)
Albumin/Globulin Ratio: 1.1 — ABNORMAL LOW (ref 1.2–2.2)
Albumin: 4 g/dL (ref 3.7–4.7)
Alkaline Phosphatase: 80 IU/L (ref 44–121)
BUN/Creatinine Ratio: 15 (ref 12–28)
BUN: 20 mg/dL (ref 8–27)
Bilirubin Total: <0.2 mg/dL (ref 0.0–1.2)
CO2: 23 mmol/L (ref 20–29)
Calcium: 9.5 mg/dL (ref 8.7–10.3)
Chloride: 100 mmol/L (ref 96–106)
Creatinine, Ser: 1.32 mg/dL — ABNORMAL HIGH (ref 0.57–1.00)
GFR calc Af Amer: 46 mL/min/{1.73_m2} — ABNORMAL LOW (ref >59)
GFR calc non Af Amer: 40 mL/min/{1.73_m2} — ABNORMAL LOW (ref >59)
Globulin, Total: 3.5 g/dL (ref 1.5–4.5)
Glucose: 231 mg/dL — ABNORMAL HIGH (ref 65–99)
Potassium: 4.8 mmol/L (ref 3.5–5.2)
Sodium: 139 mmol/L (ref 134–144)
Total Protein: 7.5 g/dL (ref 6.0–8.5)

## 2020-03-05 LAB — LIPID PANEL
Chol/HDL Ratio: 5.3 ratio — ABNORMAL HIGH (ref 0.0–4.4)
Cholesterol, Total: 154 mg/dL (ref 100–199)
HDL: 29 mg/dL — ABNORMAL LOW (ref >39)
LDL Chol Calc (NIH): 81 mg/dL (ref 0–99)
Triglycerides: 267 mg/dL — ABNORMAL HIGH (ref 0–149)
VLDL Cholesterol Cal: 44 mg/dL — ABNORMAL HIGH (ref 5–40)

## 2020-03-05 LAB — VITAMIN D 25 HYDROXY (VIT D DEFICIENCY, FRACTURES): Vit D, 25-Hydroxy: 33.8 ng/mL (ref 30.0–100.0)

## 2020-03-05 NOTE — Progress Notes (Signed)
MEDICARE ANNUAL WELLNESS VISIT  03/05/2020  Telephone Visit Disclaimer This Medicare AWV was conducted by telephone due to national recommendations for restrictions regarding the COVID-19 Pandemic (e.g. social distancing).  I verified, using two identifiers, that I am speaking with Alexandra Hunt or their authorized healthcare agent. I discussed the limitations, risks, security, and privacy concerns of performing an evaluation and management service by telephone and the potential availability of an in-person appointment in the future. The patient expressed understanding and agreed to proceed.  Location of Patient: home Location of Provider (nurse):  office  Subjective:    Alexandra Hunt is a 75 y.o. female patient of Chevis Pretty, [place] who had a Medicare Annual Wellness Visit today via telephone. Alexandra Hunt is Retired and lives with their family. she has 5 children. she reports that she is socially active and does interact with friends/family regularly. she is minimally physically active and enjoys playing with her pets and spending time with her family.  Patient Care Team: Chevis Pretty, FNP as PCP - General (Family Medicine) Maisie Fus, MD as Consulting Physician (Obstetrics and Gynecology) Michael Boston, MD as Consulting Physician (General Surgery) Gatha Mayer, MD as Consulting Physician (Gastroenterology) Lavera Guise, [facility] (Pharmacist)  Advanced Directives 03/05/2020 07/03/2018 03/14/2015 12/20/2014  Does Patient Have a Medical Advance Directive? No No No No  Would patient like information on creating a medical advance directive? No - Patient declined Yes (MAU/Ambulatory/Procedural Areas - Information given) Yes - Educational materials given Yes - Educational materials given    Hospital Utilization Over the Past 12 Months: # of hospitalizations or ER visits: 3 # of surgeries: 0  Review of Systems    Patient reports that her overall health is worse compared to  last year.  History obtained from the patient  Patient Reported Readings (BP, Pulse, CBG, Weight, etc) none  Pain Assessment Pain : 0-10 Pain Score: 7  Pain Type: Chronic pain Pain Location: Hand Pain Orientation: Left Pain Descriptors / Indicators: Burning Pain Onset: More than a month ago Pain Frequency: Constant Pain Relieving Factors: steroid injections-but now will need surgery Effect of Pain on Daily Activities: sometimes  Pain Relieving Factors: steroid injections-but now will need surgery  Current Medications & Allergies (verified) Allergies as of 03/05/2020      Reactions   Penicillins    SOB,rash   Ivp Dye [iodinated Diagnostic Agents]    Burning at IV site   Statins    myalgias   Codeine    nauseated   Latex    rash   Sulfa Antibiotics    nauseated      Medication List       Accurate as of March 05, 2020 11:08 AM. If you have any questions, ask your nurse or doctor.        albuterol 108 (90 Base) MCG/ACT inhaler Commonly known as: VENTOLIN HFA Inhale 2 puffs into the lungs every 6 (six) hours as needed for wheezing or shortness of breath.   aspirin 81 MG tablet Take 81 mg by mouth daily.   B-D SINGLE USE SWABS REGULAR Pads TEST BS UP TO FOUR TIMES DAILY Dx E11.9   B-D UF III MINI PEN NEEDLES 31G X 5 MM Misc Generic drug: Insulin Pen Needle USE WITH SLIDING SCALE HUMALOG 4 TIMES DAILY DX: E11.9   dapagliflozin propanediol 5 MG Tabs tablet Commonly known as: Farxiga Take 1 tablet (5 mg total) by mouth daily.   diclofenac 75 MG EC tablet  Commonly known as: VOLTAREN TAKE 1 TABLET BY MOUTH TWICE A DAY AS NEEDED   dorzolamide 2 % ophthalmic solution Commonly known as: TRUSOPT Place 2 drops into both eyes 2 (two) times daily.   fenofibrate 160 MG tablet Take 1 tablet (160 mg total) by mouth daily.   fluticasone 50 MCG/ACT nasal spray Commonly known as: FLONASE Place 2 sprays into both nostrils daily.   FreeStyle Libre 2 Reader [employer] Use  to test blood sugar 4-6 times daily as directed DX: E11.9   FreeStyle Libre 2 Sensor Misc Use to test blood sugar 4-6 times daily as directed DX: E11.9   furosemide 20 MG tablet Commonly known as: LASIX TAKE 1 TABLET BY MOUTH EVERY DAY   ibuprofen 800 MG tablet Commonly known as: ADVIL TAKE 1 TABLET BY MOUTH EVERY 8 HOURS AS NEEDED   insulin regular 100 units/mL injection Commonly known as: NovoLIN R INJECT 22UNITS 4 TIMES A DAY PER SLIDING SCALE   Insulin Syringe-Needle U-100 31G X 5/16" 1 ML Misc Commonly known as: B-D INS SYR ULTRAFINE 1CC/31G USE DAILY WITH LANTUS AND HUMALOG INSULIN SLIDING SCALES AS INSTRUCTED   Lancet Device Misc USE TO CHECK BLOOD SUGAR TWICE DAILY AND AS NEEDED  E11.9   lisinopril 20 MG tablet Commonly known as: ZESTRIL Take 1 tablet (20 mg total) by mouth daily.   LORazepam 1 MG tablet Commonly known as: ATIVAN Take 1 tablet (1 mg total) by mouth 3 (three) times daily.   magic mouthwash Soln Take 5 mLs by mouth 4 (four) times daily.   meclizine 25 MG tablet Commonly known as: ANTIVERT Take 1 tablet (25 mg total) by mouth 3 (three) times daily as needed for dizziness.   metFORMIN 500 MG tablet Commonly known as: GLUCOPHAGE Take 1 tablet (500 mg total) by mouth 2 (two) times daily with a meal.   mupirocin cream 2 % Commonly known as: Bactroban Apply 1 application topically 2 (two) times daily.   ondansetron 4 MG tablet Commonly known as: ZOFRAN   sertraline 100 MG tablet Commonly known as: ZOLOFT Take 2 tablets (200 mg total) by mouth daily.   silver sulfADIAZINE 1 % cream Commonly known as: SILVADENE Apply 1 application topically 2 (two) times daily.   tolterodine 4 MG 24 hr capsule Commonly known as: Detrol LA Take 1 capsule (4 mg total) by mouth daily.   Toujeo SoloStar 300 UNIT/ML Solostar Pen Generic drug: insulin glargine (1 Unit Dial) Inject 85 Units into the skin in the morning and at bedtime.   True Metrix Air Glucose  Meter w/Device Kit TEST BS UP TO FOUR TIMES DAILY Dx E11.9   True Metrix Blood Glucose Test test strip Generic drug: glucose blood TEST BS UP TO FOUR TIMES DAILY Dx E11.9   True Metrix Level 2 Normal Soln Use with glucose machine Dx E11.9   TRUEplus Lancets 30G Misc TEST BS UP TO FOUR TIMES DAILY Dx E11.9   Vitamin D (Ergocalciferol) 1.25 MG (50000 UNIT) Caps capsule Commonly known as: DRISDOL Take 1 capsule (50,000 Units total) by mouth every 7 (seven) days.       History (reviewed): Past Medical History:  Diagnosis Date  . Benign breast lumps   . Cancer [facility]) 2007   uterine  . Cervical radiculopathy   . Cervical radiculopathy   . Change in bowel habits    soft stools  . Depression   . Diabetes mellitus without complication ([place])    type 2 diabetic  . History of  anal fissures   . Hx of adenomatous colonic polyps 03/20/2018  . Hx of gallstones   . Hyperlipidemia   . Hypertension   . 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 Skin cancer    on arms/right hand  . Sleep apnea    Past Surgical History:  Procedure Laterality Date  . ABDOMINAL HYSTERECTOMY     due to uterine cancer  . BREAST SURGERY     left breast biopsy/benign  . TUBAL LIGATION     Family History  Problem Relation Age of Onset  . Diabetes Mother   . Blindness Mother        related to diabetes  . Stroke Mother   . Heart disease Father        MI  . Heart attack Father   . Heart defect Father   . Breast cancer Sister   . Diabetes Brother        diet controlled  . Colon cancer Neg Hx   . Esophageal cancer Neg Hx   . Rectal cancer Neg Hx   . Stomach cancer Neg Hx    Social History   Socioeconomic History  . Marital status: Widowed    Spouse name: Not on file  . Number of children: 5  . Years of education: 59  . Highest education level: Associate degree: occupational, Hotel manager, or  vocational program  Occupational History  . Not on file  Tobacco Use  . Smoking status: Never Smoker  . Smokeless tobacco: Never Used  Vaping Use  . Vaping Use: Never used  Substance and Sexual Activity  . Alcohol use: No  . Drug use: No  . Sexual activity: Not Currently    Birth control/protection: Post-menopausal  Other Topics Concern  . Not on file  Social History Narrative  . Not on file   Social Determinants of Health   Financial Resource Strain: Low Risk   . Difficulty of Paying Living Expenses: Not hard at all  Food Insecurity: No Food Insecurity  . Worried About Charity fundraiser in the Last Year: Never true  . Ran Out of Food in the Last Year: Never true  Transportation Needs: No Transportation Needs  . Lack of Transportation (Medical): No  . Lack of Transportation (Non-Medical): No  Physical Activity: Inactive  . Days of Exercise per Week: 0 days  . Minutes of Exercise per Session: 0 min  Stress: Stress Concern Present  . Feeling of Stress : To some extent  Social Connections: Moderately Integrated  . Frequency of Communication with Friends and Family: More than three times a week  . Frequency of Social Gatherings with Friends and Family: More than three times a week  . Attends Religious Services: More than 4 times per year  . Active Member of Clubs or Organizations: Yes  . Attends Archivist Meetings: More than 4 times per year  . Marital Status: Widowed    Activities of Daily Living In your present state of health, do you have any difficulty performing the following activities: 03/05/2020  Hearing? N  Vision? Y  Comment cataracts  Difficulty concentrating or making decisions? N  Walking or climbing stairs? N  Dressing or bathing? N  Doing errands, shopping? Y  Comment her family drives her to all appointments and to do errands  Preparing Food and eating ? N  Using the Toilet?  N  In the past six months, have you accidently leaked urine? Y   Comment wears depends at all times  Do you have problems with loss of bowel control? N  Managing your Medications? N  Managing your Finances? N  Housekeeping or managing your Housekeeping? 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   Depression Screen PHQ 2/9 Scores 03/05/2020 03/04/2020 07/31/2019 04/10/2019 08/07/2018 07/03/2018 11/09/2017  PHQ - 2 Score 6 6 0 $R'5 5 4 6  'UJ$ PHQ- 9 Score 13 14 0 $Re'9 11 10 14     'JeA$ Fall Risk Fall Risk  03/05/2020 03/04/2020 07/31/2019 04/10/2019 07/03/2018  Falls in the past year? 0 0 1 0 1  Number falls in past yr: - - 0 - 0  Injury with Fall? - - 0 - 0  Risk for fall due to : No Fall Risks - - - -  Follow up Falls evaluation completed - - - Education provided;Falls prevention discussed     Objective:  Alexandra Hunt seemed alert and oriented and she participated appropriately during our telephone visit.  Blood Pressure Weight BMI  BP Readings from Last 3 Encounters:  03/04/20 (!) 115/54  07/31/19 (!) 111/58  05/05/18 128/63   Wt Readings from Last 3 Encounters:  03/04/20 199 lb (90.3 kg)  07/31/19 201 lb (91.2 kg)  05/05/18 206 lb (93.4 kg)   BMI Readings from Last 1 Encounters:  03/04/20 34.16 kg/m    *Unable to obtain current vital signs, weight, and BMI due to telephone visit type  Hearing/Vision  . Alexandra Hunt did not seem to have difficulty with hearing/understanding  during the telephone conversation . Reports that she has not had a formal eye exam by an eye care professional within the past year . Reports that she has not had a formal hearing evaluation within the past year *Unable to fully assess hearing and vision during telephone visit type  Cognitive Function: 6CIT Screen 03/05/2020 07/03/2018  What Year? 0 points 0 points  What month? 0 points 0 points  What time? 0 points 0 points  Count back from 20 0 points 0 points  Months in reverse 2 points 2 points  Repeat phrase 0 points 0 points  Total Score 2 2   (Normal:0-7, Significant for Dysfunction: >8)  Normal Cognitive Function Screening: Yes   Immunization & Health Maintenance Record Immunization History  Administered Date(s) Administered  . Influenza, High Dose Seasonal PF 12/27/2016, 01/27/2018  . Influenza,inj,Quad PF,6+ Mos 02/07/2013, 12/20/2014, 12/02/2015  . Influenza,inj,quad, With Preservative 12/30/2016  . Influenza-Unspecified 05/30/2014  . Pneumococcal Conjugate-13 12/20/2014  . Pneumococcal Polysaccharide-23 09/29/2012  . Tdap 03/03/2012    Health Maintenance  Topic Date Due  . OPHTHALMOLOGY EXAM  05/25/2017  . INFLUENZA VACCINE  09/30/2019  . COVID-19 Vaccine (1) 03/20/2020 (Originally 08/24/1957)  . DEXA SCAN  07/30/2020 (Originally 05/05/2017)  . MAMMOGRAM  03/06/2020  .  LIPID PANEL  06/02/2020  . HEMOGLOBIN A1C  06/02/2020  . FOOT EXAM  03/04/2021  . TETANUS/TDAP  03/03/2022  . COLONOSCOPY (Pts 45-53yrs Insurance coverage will need to be confirmed)  03/15/2023  . Hepatitis C Screening  Completed  . PNA vac Low Risk Adult  Completed       Assessment  This is a routine wellness examination for Alexandra Hunt.  Health Maintenance: Due or Overdue Health Maintenance Due  Topic Date Due  . OPHTHALMOLOGY EXAM  05/25/2017  . INFLUENZA VACCINE  09/30/2019    Alexandra Hunt does not need a referral for Community Assistance: Care Management:   no Social  Work:    no Prescription Assistance:  no Nutrition/Diabetes Education:  no   Plan:  Personalized Goals Goals Addressed              This Visit's Progress   .  Patient Stated (pt-stated)        " I would like to get healthier"      Personalized Health Maintenance & Screening Recommendations  Influenza vaccine Diabetic Eye Exam  Lung Cancer Screening Recommended: no (Low Dose CT Chest recommended if Age 22-80 years, 30 pack-year currently smoking OR have quit w/in past 15 years) Hepatitis C Screening recommended: no HIV Screening recommended: no  Advanced Directives: Written information was not prepared per patient's request.  Referrals & Orders No orders of the defined types were placed in this encounter.   Follow-up Plan . Follow-up with Chevis Pretty, FNP as planned . Schedule your Diabetic Eye Exam as discussed . Consider Flu vaccine   I have personally reviewed and noted the following in the patient's chart:   . Medical and social history . Use of alcohol, tobacco or illicit drugs  . Current medications and supplements . Functional ability and status . Nutritional status . Physical activity . Advanced directives . List of other physicians . Hospitalizations, surgeries, and ER visits in previous 12 months . Vitals . Screenings to include cognitive, depression, and falls . Referrals and appointments  In addition, I have reviewed and discussed with Alexandra Hunt certain preventive protocols, quality metrics, and best practice recommendations. A written personalized care plan for preventive services as well as general preventive health recommendations is available and can be mailed to the patient at her request.      Milas Hock, LPN  07/02/3604

## 2020-03-05 NOTE — Patient Instructions (Signed)
  Alexandra Hunt , Thank you for taking time to come for your Medicare Wellness Visit. I appreciate your ongoing commitment to your health goals. Please review the following plan we discussed and let me know if I can assist you in the future.   These are the goals we discussed: Goals    .  Exercise 3x per week (30 min per time)      Walking or riding stationary bicycle are great options.    .  Patient Stated (pt-stated)      " I would like to get healthier"       This is a list of the screening recommended for you and due dates:  Health Maintenance  Topic Date Due  . Eye exam for diabetics  05/25/2017  . Flu Shot  09/30/2019  . COVID-19 Vaccine (1) 03/20/2020*  . DEXA scan (bone density measurement)  07/30/2020*  . Mammogram  03/06/2020  . Lipid (cholesterol) test  06/02/2020  . Hemoglobin A1C  06/02/2020  . Complete foot exam   03/04/2021  . Tetanus Vaccine  03/03/2022  . Colon Cancer Screening  03/15/2023  .  Hepatitis C: One time screening is recommended by Center for Disease Control  (CDC) for  adults born from [birth year] through 1963.   Completed  . Pneumonia vaccines  Completed  *Topic was postponed. The date shown is not the original due date.

## 2020-03-15 DIAGNOSIS — R0600 Dyspnea, unspecified: Secondary | ICD-10-CM | POA: Diagnosis not present

## 2020-03-15 DIAGNOSIS — R5081 Fever presenting with conditions classified elsewhere: Secondary | ICD-10-CM | POA: Diagnosis not present

## 2020-03-15 DIAGNOSIS — H60391 Other infective otitis externa, right ear: Secondary | ICD-10-CM | POA: Diagnosis not present

## 2020-03-15 DIAGNOSIS — J0100 Acute maxillary sinusitis, unspecified: Secondary | ICD-10-CM | POA: Diagnosis not present

## 2020-03-15 DIAGNOSIS — R0602 Shortness of breath: Secondary | ICD-10-CM | POA: Diagnosis not present

## 2020-03-15 DIAGNOSIS — R059 Cough, unspecified: Secondary | ICD-10-CM | POA: Diagnosis not present

## 2020-04-02 ENCOUNTER — Other Ambulatory Visit: Payer: Self-pay | Admitting: Nurse Practitioner

## 2020-04-02 DIAGNOSIS — F411 Generalized anxiety disorder: Secondary | ICD-10-CM

## 2020-04-07 ENCOUNTER — Other Ambulatory Visit: Payer: Self-pay | Admitting: Nurse Practitioner

## 2020-04-14 DIAGNOSIS — H40023 Open angle with borderline findings, high risk, bilateral: Secondary | ICD-10-CM | POA: Diagnosis not present

## 2020-04-14 DIAGNOSIS — H04123 Dry eye syndrome of bilateral lacrimal glands: Secondary | ICD-10-CM | POA: Diagnosis not present

## 2020-04-14 DIAGNOSIS — H353132 Nonexudative age-related macular degeneration, bilateral, intermediate dry stage: Secondary | ICD-10-CM | POA: Diagnosis not present

## 2020-04-14 DIAGNOSIS — H2513 Age-related nuclear cataract, bilateral: Secondary | ICD-10-CM | POA: Diagnosis not present

## 2020-04-14 DIAGNOSIS — H25013 Cortical age-related cataract, bilateral: Secondary | ICD-10-CM | POA: Diagnosis not present

## 2020-04-14 LAB — HM DIABETES EYE EXAM

## 2020-05-04 ENCOUNTER — Other Ambulatory Visit: Payer: Self-pay | Admitting: Nurse Practitioner

## 2020-05-29 ENCOUNTER — Telehealth: Payer: Self-pay

## 2020-05-29 MED ORDER — FLUTICASONE PROPIONATE 50 MCG/ACT NA SUSP: 2.0000 | Freq: Every day | NASAL | 6 refills | Status: DC

## 2020-05-29 NOTE — Telephone Encounter (Signed)
  Prescription Request  05/29/2020  What is the name of the medication or equipment? fluticasone (FLONASE) 50 MCG/ACT nasal spray    Have you contacted your pharmacy to request a refill? (if applicable) yes  Which pharmacy would you like this sent to? [facility]    Patient notified that their request is being sent to the clinical staff for review and that they should receive a response within 2 business days.

## 2020-05-29 NOTE — Telephone Encounter (Signed)
Sent Flonase to pharmacy per patients and pharmacy requests

## 2020-06-03 ENCOUNTER — Ambulatory Visit: Payer: Self-pay | Admitting: Nurse Practitioner

## 2020-06-03 ENCOUNTER — Ambulatory Visit: Payer: Medicare HMO

## 2020-06-03 ENCOUNTER — Ambulatory Visit (INDEPENDENT_AMBULATORY_CARE_PROVIDER_SITE_OTHER): Payer: Medicare HMO | Admitting: Nurse Practitioner

## 2020-06-03 ENCOUNTER — Other Ambulatory Visit: Payer: Self-pay | Admitting: Nurse Practitioner

## 2020-06-03 ENCOUNTER — Other Ambulatory Visit: Payer: Self-pay

## 2020-06-03 ENCOUNTER — Encounter: Payer: Self-pay | Admitting: Nurse Practitioner

## 2020-06-03 DIAGNOSIS — J069 Acute upper respiratory infection, unspecified: Secondary | ICD-10-CM

## 2020-06-03 MED ORDER — AZITHROMYCIN 250 MG PO TABS: ORAL_TABLET | ORAL | 0 refills | Status: DC

## 2020-06-03 MED ORDER — PSEUDOEPH-BROMPHEN-DM 30-2-10 MG/5ML PO SYRP: 5.0000 mL | ORAL_SOLUTION | Freq: Four times a day (QID) | ORAL | 0 refills | Status: DC | PRN

## 2020-06-03 NOTE — Progress Notes (Signed)
Virtual Visit  Note Due to COVID-19 pandemic this visit was conducted virtually. This visit type was conducted due to national recommendations for restrictions regarding the COVID-19 Pandemic (e.g. social distancing, sheltering in place) in an effort to limit this patient's exposure and mitigate transmission in our community. All issues noted in this document were discussed and addressed.  A physical exam was not performed with this format.  I connected with Alexandra Hunt on 06/03/20 at 4:30 by telephone and verified that I am speaking with the correct person using two identifiers. Alexandra Hunt is currently located at home and no one is currently with her during visit. The provider, Mary-Margaret Hassell Done, FNP is located in their office at time of visit.  I discussed the limitations, risks, security and privacy concerns of performing an evaluation and management service by telephone and the availability of in person appointments. I also discussed with the patient that there may be a patient responsible charge related to this service. The patient expressed understanding and agreed to proceed.   History and Present Illness:   Chief Complaint: URI   HPI Patient was suppose to come in for follow up of chronic medical problems but she was sick so they would not let her be seen. She is c/o cough, congestion for over a week. Has developed facial pressure and fever the last 2 days. Cough is keep he rup at night.   Review of Systems  Constitutional: Positive for fever (100.0). Negative for chills.  HENT: Positive for congestion and sinus pain. Negative for ear pain and sore throat.   Respiratory: Positive for cough, sputum production and shortness of breath.   Musculoskeletal: Positive for myalgias.  Neurological: Negative for dizziness and headaches.  All other systems reviewed and are negative.    Observations/Objective: Alert and oriented- answers all questions appropriately No  distress Voice hoarse Deep wet sounding cough  Assessment and Plan: Alexandra Hunt in today with chief complaint of URI   1. URI with cough and congestion 1. Take meds as prescribed 2. Use a cool mist humidifier especially during the winter months and when heat has been humid. 3. Use saline nose sprays frequently 4. Saline irrigations of the nose can be very helpful if done frequently.  * 4X daily for 1 week*  * Use of a nettie pot can be helpful with this. Follow directions with this* 5. Drink plenty of fluids 6. Keep thermostat turn down low 7.For any cough or congestion  Use plain Mucinex- regular strength or max strength is fine   * Children- consult with Pharmacist for dosing 8. For fever or aces or pains- take tylenol or ibuprofen appropriate for age and weight.  * for fevers greater than 101 orally you may alternate ibuprofen and tylenol every  3 hours.   Meds ordered this encounter  Medications  . azithromycin (ZITHROMAX Z-PAK) 250 MG tablet    Sig: As directed    Dispense:  6 tablet    Refill:  0    Order Specific Question:   Supervising Provider    Answer:   Caryl Pina A [id number]  . brompheniramine-pseudoephedrine-DM 30-2-10 MG/5ML syrup    Sig: Take 5 mLs by mouth 4 (four) times daily as needed.    Dispense:  120 mL    Refill:  0    Order Specific Question:   Supervising Provider    Answer:   Caryl Pina A [[id number]]       Follow Up Instructions: When  better for follow up of chronic medical problems    I discussed the assessment and treatment plan with the patient. The patient was provided an opportunity to ask questions and all were answered. The patient agreed with the plan and demonstrated an understanding of the instructions.   The patient was advised to call back or seek an in-person evaluation if the symptoms worsen or if the condition fails to improve as anticipated.  The above assessment and management plan was discussed with the  patient. The patient verbalized understanding of and has agreed to the management plan. Patient is aware to call the clinic if symptoms persist or worsen. Patient is aware when to return to the clinic for a follow-up visit. Patient educated on when it is appropriate to go to the emergency department.   Time call ended:  4:43  I provided 13 minutes of  non face-to-face time during this encounter.    Mary-Margaret Hassell Done, FNP

## 2020-06-12 ENCOUNTER — Telehealth: Payer: Self-pay

## 2020-06-12 NOTE — Telephone Encounter (Signed)
Pt aware ppw is on providers desk to be signed

## 2020-06-12 NOTE — Telephone Encounter (Signed)
  Prescription Request  06/12/2020  What is the name of the medication or equipment? Insulin-Novalin  Have you contacted your pharmacy to request a refill? (if applicable) yes  Which pharmacy would you like this sent to? [facility]--Attn: Jackelyn Poling    Patient notified that their request is being sent to the clinical staff for review and that they should receive a response within 2 business days.   MMM's pt.  She has been waiting for 10 days to get this med.  Please call pt.

## 2020-06-23 ENCOUNTER — Encounter: Payer: Self-pay | Admitting: Nurse Practitioner

## 2020-06-23 ENCOUNTER — Other Ambulatory Visit: Payer: Self-pay

## 2020-06-23 ENCOUNTER — Ambulatory Visit (INDEPENDENT_AMBULATORY_CARE_PROVIDER_SITE_OTHER): Payer: Medicare HMO | Admitting: Nurse Practitioner

## 2020-06-23 VITALS — BP 134/62 | HR 94 | Temp 98.6°F | Resp 20 | Ht 64.0 in | Wt 204.0 lb

## 2020-06-23 DIAGNOSIS — F411 Generalized anxiety disorder: Secondary | ICD-10-CM

## 2020-06-23 DIAGNOSIS — E782 Mixed hyperlipidemia: Secondary | ICD-10-CM | POA: Diagnosis not present

## 2020-06-23 DIAGNOSIS — Z6832 Body mass index (BMI) 32.0-32.9, adult: Secondary | ICD-10-CM | POA: Diagnosis not present

## 2020-06-23 DIAGNOSIS — Z794 Long term (current) use of insulin: Secondary | ICD-10-CM

## 2020-06-23 DIAGNOSIS — N393 Stress incontinence (female) (male): Secondary | ICD-10-CM

## 2020-06-23 DIAGNOSIS — I10 Essential (primary) hypertension: Secondary | ICD-10-CM | POA: Diagnosis not present

## 2020-06-23 DIAGNOSIS — G4733 Obstructive sleep apnea (adult) (pediatric): Secondary | ICD-10-CM | POA: Diagnosis not present

## 2020-06-23 DIAGNOSIS — E1142 Type 2 diabetes mellitus with diabetic polyneuropathy: Secondary | ICD-10-CM

## 2020-06-23 DIAGNOSIS — M8580 Other specified disorders of bone density and structure, unspecified site: Secondary | ICD-10-CM

## 2020-06-23 DIAGNOSIS — F339 Major depressive disorder, recurrent, unspecified: Secondary | ICD-10-CM

## 2020-06-23 DIAGNOSIS — R7989 Other specified abnormal findings of blood chemistry: Secondary | ICD-10-CM

## 2020-06-23 DIAGNOSIS — R200 Anesthesia of skin: Secondary | ICD-10-CM

## 2020-06-23 DIAGNOSIS — R202 Paresthesia of skin: Secondary | ICD-10-CM

## 2020-06-23 LAB — BAYER DCA HB A1C WAIVED: HB A1C (BAYER DCA - WAIVED): 8.6 % — ABNORMAL HIGH (ref <7.0)

## 2020-06-23 MED ORDER — FUROSEMIDE 20 MG PO TABS: ORAL_TABLET | ORAL | 1 refills | Status: DC

## 2020-06-23 MED ORDER — LORAZEPAM 1 MG PO TABS
1.0000 mg | ORAL_TABLET | Freq: Three times a day (TID) | ORAL | 2 refills | Status: DC
Start: 2020-06-23 — End: 2020-11-28

## 2020-06-23 MED ORDER — SERTRALINE HCL 100 MG PO TABS: 200.0000 mg | ORAL_TABLET | Freq: Every day | ORAL | 1 refills | Status: DC

## 2020-06-23 MED ORDER — DAPAGLIFLOZIN PROPANEDIOL 5 MG PO TABS: 5.0000 mg | ORAL_TABLET | Freq: Every day | ORAL | 0 refills | Status: DC

## 2020-06-23 MED ORDER — LISINOPRIL 20 MG PO TABS: 20.0000 mg | ORAL_TABLET | Freq: Every day | ORAL | 1 refills | Status: DC

## 2020-06-23 MED ORDER — TOLTERODINE TARTRATE ER 4 MG PO CP24: 4.0000 mg | ORAL_CAPSULE | Freq: Every day | ORAL | 3 refills | Status: DC

## 2020-06-23 MED ORDER — METFORMIN HCL 500 MG PO TABS: 500.0000 mg | ORAL_TABLET | Freq: Two times a day (BID) | ORAL | 1 refills | Status: DC

## 2020-06-23 NOTE — Patient Instructions (Signed)

## 2020-06-23 NOTE — Progress Notes (Addendum)
Subjective:    Patient ID: Alexandra Hunt, female    DOB: 14-Oct-1945, 75 y.o.   MRN: [id number]   Chief Complaint: Medical Management of Chronic Issues    HPI:  1. Essential hypertension, malignant No c/o chest pain, sob ir headache. Does not check blood pressure at home. BP Readings from Last 3 Encounters:  06/23/20 134/62  03/04/20 (!) 115/54  07/31/19 (!) 111/58     2. Mixed hyperlipidemia Does not watch diet and does no exercise. Has refused statin in the past due to myalgia Lab Results  Component Value Date   CHOL 154 03/04/2020   HDL 29 (L) 03/04/2020   LDLCALC 81 03/04/2020   TRIG 267 (H) 03/04/2020   CHOLHDL 5.3 (H) 03/04/2020     3. Type 2 diabetes mellitus with diabetic polyneuropathy, with long-term current use of insulin (HCC) Blood sugars are really high. Running over 200 most of the time. She does not watch her diet and does no exercise. She checks her blood sugars 4 x a day. She is on tujeo 85u BID and novalin R sliding scale which she does not always do.  Lab Results  Component Value Date   HGBA1C 9.0 (H) 03/04/2020    4. Obstructive sleep apnea syndrome Has not been wearing her CPAP. Says she has no excuse for it.  5. Episode of recurrent major depressive disorder, unspecified depression episode severity ([place]) Has been depressed for many years. Has gotten worse since her husband died 2 years ago. She is currently on zoloft daily. Depression screen [facility] 2/9 06/23/2020 03/05/2020 03/04/2020  Decreased Interest _0 Down, Depressed, Hopeless _1 PHQ - 2 Score _2 Altered sleeping _3 Tired, decreased energy _4 Change in appetite 0 0 1  Feeling bad or failure about yourself  _5 Trouble concentrating 0 0 0  Moving slowly or fidgety/restless 0 0 0  Suicidal thoughts 0 0 0  PHQ-9 Score _6 Difficult doing work/chores Not difficult at all Very difficult Not difficult at all  Some recent data might be hidden     6. GAD  (generalized anxiety disorder) Takes ativen BID. styas anxious even on meds. Thinks it is her home situation which makes her anxious. GAD 7 : Generalized Anxiety Score 06/23/2020 03/04/2020 07/31/2019 04/10/2019  Nervous, Anxious, on Edge _7 Control/stop worrying _8 Worry too much - different things _9 Trouble relaxing 1 0 3 0  Restless 0 0 2 0  Easily annoyed or irritable 0 1 3 0  Afraid - awful might happen 3 1 0 0  Total GAD 7 Score _10 Anxiety Difficulty Not difficult at all Somewhat difficult - Somewhat difficult      7. Low serum vitamin D Not taking vitamin d supplement  8. Osteopenia, unspecified location Last was in 2017- needs to be repeated- cannot do today  9. BMI 32.0-32.9,adult Weight is up 5lbs since last visit Wt Readings from Last 3 Encounters:  06/23/20 204 lb (92.5 kg)  03/04/20 199 lb (90.3 kg)  07/31/19 201 lb (91.2 kg)   BMI Readings from Last 3 Encounters:  06/23/20 35.02 kg/m  03/04/20 34.16 kg/m  07/31/19 34.50 kg/m      Outpatient Encounter Medications as of 06/23/2020  Medication Sig   albuterol (VENTOLIN HFA) 108 (90 Base) MCG/ACT inhaler Inhale  2 puffs into the lungs every 6 (six) hours as needed for wheezing or shortness of breath.   Alcohol Swabs (B-D SINGLE USE SWABS REGULAR) PADS TEST BS UP TO FOUR TIMES DAILY Dx E11.9   aspirin 81 MG tablet Take 81 mg by mouth daily.   azithromycin (ZITHROMAX Z-PAK) 250 MG tablet As directed   Blood Glucose Calibration (TRUE METRIX LEVEL 2) Normal SOLN Use with glucose machine Dx E11.9   Blood Glucose Monitoring Suppl (TRUE METRIX METER) w/Device KIT TEST BS UP TO FOUR TIMES DAILY Dx E11.9   brompheniramine-pseudoephedrine-DM 30-2-10 MG/5ML syrup Take 5 mLs by mouth 4 (four) times daily as needed.   Continuous Blood Gluc Receiver (FREESTYLE LIBRE 2 READER) DEVI Use to test blood sugar 4-6 times daily as directed DX: E11.9   Continuous Blood Gluc Sensor (FREESTYLE LIBRE 2 SENSOR)  MISC Use to test blood sugar 4-6 times daily as directed DX: E11.9   dapagliflozin propanediol (FARXIGA) 5 MG TABS tablet Take 1 tablet (5 mg total) by mouth daily.   diclofenac (VOLTAREN) 75 MG EC tablet TAKE 1 TABLET BY MOUTH TWICE A DAY AS NEEDED   dorzolamide (TRUSOPT) 2 % ophthalmic solution Place 2 drops into both eyes 2 (two) times daily.   fenofibrate 160 MG tablet Take 1 tablet (160 mg total) by mouth daily.   fluticasone (FLONASE) 50 MCG/ACT nasal spray Place 2 sprays into both nostrils daily.   furosemide (LASIX) 20 MG tablet TAKE 1 TABLET BY MOUTH EVERY DAY   glucose blood (TRUE METRIX BLOOD GLUCOSE TEST) test strip TEST BS UP TO FOUR TIMES DAILY Dx E11.9   ibuprofen (ADVIL,MOTRIN) 800 MG tablet TAKE 1 TABLET BY MOUTH EVERY 8 HOURS AS NEEDED   insulin glargine, 1 Unit Dial, (TOUJEO SOLOSTAR) 300 UNIT/ML Solostar Pen Inject 85 Units into the skin in the morning and at bedtime.   Insulin Pen Needle (B-D UF III MINI PEN NEEDLES) 31G X 5 MM MISC USE WITH SLIDING SCALE HUMALOG 4 TIMES DAILY DX: E11.9   insulin regular (NOVOLIN R) 100 units/mL injection INJECT 22UNITS 4 TIMES A DAY PER SLIDING SCALE   Insulin Syringe-Needle U-100 (B-D INS SYR ULTRAFINE 1CC/31G) 31G X 5/16" 1 ML MISC USE DAILY WITH LANTUS AND HUMALOG INSULIN SLIDING SCALES AS INSTRUCTED   Lancet Device MISC USE TO CHECK BLOOD SUGAR TWICE DAILY AND AS NEEDED  E11.9   lisinopril (ZESTRIL) 20 MG tablet Take 1 tablet (20 mg total) by mouth daily.   LORazepam (ATIVAN) 1 MG tablet Take 1 tablet (1 mg total) by mouth 3 (three) times daily.   magic mouthwash SOLN Take 5 mLs by mouth 4 (four) times daily.   meclizine (ANTIVERT) 25 MG tablet Take 1 tablet (25 mg total) by mouth 3 (three) times daily as needed for dizziness.   metFORMIN (GLUCOPHAGE) 500 MG tablet Take 1 tablet (500 mg total) by mouth 2 (two) times daily with a meal.   mupirocin cream (BACTROBAN) 2 % Apply 1 application topically 2 (two) times daily.   ondansetron  (ZOFRAN) 4 MG tablet    sertraline (ZOLOFT) 100 MG tablet Take 2 tablets (200 mg total) by mouth daily.   silver sulfADIAZINE (SILVADENE) 1 % cream Apply 1 application topically 2 (two) times daily.   tolterodine (DETROL LA) 4 MG 24 hr capsule Take 1 capsule (4 mg total) by mouth daily.   TRUEplus Lancets 30G MISC TEST BS UP TO FOUR TIMES DAILY Dx E11.9   Vitamin D, Ergocalciferol, (DRISDOL) 1.25 MG (50000  UNIT) CAPS capsule TAKE 1 CAPSULE EVERY 7 DAYS   No facility-administered encounter medications on file as of 06/23/2020.    Past Surgical History:  Procedure Laterality Date   ABDOMINAL HYSTERECTOMY     due to uterine cancer   BREAST SURGERY     left breast biopsy/benign   TUBAL LIGATION      Family History  Problem Relation Age of Onset   Diabetes Mother    Blindness Mother        related to diabetes   Stroke Mother    Heart disease Father        MI   Heart attack Father    Heart defect Father    Breast cancer Sister    Diabetes Brother        diet controlled   Colon cancer Neg Hx    Esophageal cancer Neg Hx    Rectal cancer Neg Hx    Stomach cancer Neg Hx     New complaints: C/o bil hands and feet tingling. Has been going on for sometime. Wants to see neurologist  Social history: Her kids live with her and they do not work or help her out. That is the stress in her life.  Controlled substance contract: n/a    Review of Systems  Constitutional: Negative for diaphoresis.  Eyes: Negative for pain.  Respiratory: Negative for shortness of breath.   Cardiovascular: Negative for chest pain, palpitations and leg swelling.  Gastrointestinal: Negative for abdominal pain.  Endocrine: Negative for polydipsia.  Skin: Negative for rash.  Neurological: Negative for dizziness, weakness and headaches.  Hematological: Does not bruise/bleed easily.  All other systems reviewed and are negative.      Objective:   Physical Exam Vitals and nursing note reviewed.   Constitutional:      General: She is not in acute distress.    Appearance: Normal appearance. She is well-developed.  HENT:     Head: Normocephalic.     Nose: Nose normal.  Eyes:     Pupils: Pupils are equal, round, and reactive to light.  Neck:     Vascular: No carotid bruit or JVD.  Cardiovascular:     Rate and Rhythm: Normal rate and regular rhythm.     Heart sounds: Normal heart sounds.  Pulmonary:     Effort: Pulmonary effort is normal. No respiratory distress.     Breath sounds: Normal breath sounds. No wheezing or rales.  Chest:     Chest wall: No tenderness.  Abdominal:     General: Bowel sounds are normal. There is no distension or abdominal bruit.     Palpations: Abdomen is soft. There is no hepatomegaly, splenomegaly, mass or pulsatile mass.     Tenderness: There is no abdominal tenderness.  Musculoskeletal:        General: Normal range of motion.     Cervical back: Normal range of motion and neck supple.  Lymphadenopathy:     Cervical: No cervical adenopathy.  Skin:    General: Skin is warm and dry.  Neurological:     Mental Status: She is alert and oriented to person, place, and time.     Deep Tendon Reflexes: Reflexes are normal and symmetric.  Psychiatric:        Behavior: Behavior normal.        Thought Content: Thought content normal.        Judgment: Judgment normal.     BP 134/62   Pulse 94   Temp 98.6  F (37 C) (Temporal)   Resp 20   Ht 5' 4" (1.626 m)   Wt 204 lb (92.5 kg)   SpO2 95%   BMI 35.02 kg/m  HGBA1c 86%      Assessment & Plan:  Alexandra Hunt comes in today with chief complaint of Medical Management of Chronic Issues   Diagnosis and orders addressed:  1. Essential hypertension, malignant Low sodium diet - CBC with Differential/Platelet - CMP14+EGFR - CBC with Differential/Platelet - CMP14+EGFR - lisinopril (ZESTRIL) 20 MG tablet; Take 1 tablet (20 mg total) by mouth daily.  Dispense: 90 tablet; Refill: 1 - furosemide  (LASIX) 20 MG tablet; TAKE 1 TABLET BY MOUTH EVERY DAY  Dispense: 90 tablet; Refill: 1  2. Mixed hyperlipidemia Low fat diet - Lipid panel - Lipid panel  3. Type 2 diabetes mellitus with diabetic polyneuropathy, with long-term current use of insulin (HCC) Strict carb counting appointment with clinical pharmacist - Bayer DCA Hb A1c Waived - Microalbumin / creatinine urine ratio - Bayer DCA Hb A1c Waived - Microalbumin / creatinine urine ratio - metFORMIN (GLUCOPHAGE) 500 MG tablet; Take 1 tablet (500 mg total) by mouth 2 (two) times daily with a meal.  Dispense: 180 tablet; Refill: 1 - dapagliflozin propanediol (FARXIGA) 5 MG TABS tablet; Take 1 tablet (5 mg total) by mouth daily.  Dispense: 30 tablet; Refill: 0  4. Obstructive sleep apnea syndrome Wear CPAP please  5. Episode of recurrent major depressive disorder, unspecified depression episode severity ([place]) Stress management - sertraline (ZOLOFT) 100 MG tablet; Take 2 tablets (200 mg total) by mouth daily.  Dispense: 180 tablet; Refill: 1  6. GAD (generalized anxiety disorder) - LORazepam (ATIVAN) 1 MG tablet; Take 1 tablet (1 mg total) by mouth 3 (three) times daily.  Dispense: 90 tablet; Refill: 2  7. Low serum vitamin D Daily vitamkin d supplement  8. Osteopenia, unspecified location Weight bearing exercises  9. BMI 32.0-32.9,adult Discussed diet and exercise for person with BMI >25 Will recheck weight in 3-6 months  10. Numbness and tingling in both hands Patient request for referral - Ambulatory referral to Neurology  11. Stress incontinence of urine - tolterodine (DETROL LA) 4 MG 24 hr capsule; Take 1 capsule (4 mg total) by mouth daily.  Dispense: 30 capsule; Refill: 3   Labs pending Health Maintenance reviewed Diet and exercise encouraged  Follow up plan: 3 months   Mary-Margaret Hassell Done, FNP

## 2020-06-24 LAB — LIPID PANEL
Chol/HDL Ratio: 5.6 ratio — ABNORMAL HIGH (ref 0.0–4.4)
Cholesterol, Total: 156 mg/dL (ref 100–199)
HDL: 28 mg/dL — ABNORMAL LOW (ref >39)
LDL Chol Calc (NIH): 85 mg/dL (ref 0–99)
Triglycerides: 260 mg/dL — ABNORMAL HIGH (ref 0–149)
VLDL Cholesterol Cal: 43 mg/dL — ABNORMAL HIGH (ref 5–40)

## 2020-06-24 LAB — CMP14+EGFR
ALT: 13 IU/L (ref 0–32)
AST: 11 IU/L (ref 0–40)
Albumin/Globulin Ratio: 1.6 (ref 1.2–2.2)
Albumin: 4.5 g/dL (ref 3.7–4.7)
Alkaline Phosphatase: 67 IU/L (ref 44–121)
BUN/Creatinine Ratio: 16 (ref 12–28)
BUN: 22 mg/dL (ref 8–27)
Bilirubin Total: <0.2 mg/dL (ref 0.0–1.2)
CO2: 22 mmol/L (ref 20–29)
Calcium: 9.7 mg/dL (ref 8.7–10.3)
Chloride: 103 mmol/L (ref 96–106)
Creatinine, Ser: 1.37 mg/dL — ABNORMAL HIGH (ref 0.57–1.00)
Globulin, Total: 2.8 g/dL (ref 1.5–4.5)
Glucose: 194 mg/dL — ABNORMAL HIGH (ref 65–99)
Potassium: 4.2 mmol/L (ref 3.5–5.2)
Sodium: 143 mmol/L (ref 134–144)
Total Protein: 7.3 g/dL (ref 6.0–8.5)
eGFR: 41 mL/min/{1.73_m2} — ABNORMAL LOW (ref >59)

## 2020-06-24 LAB — CBC WITH DIFFERENTIAL/PLATELET
Basophils Absolute: 0.1 10*3/uL (ref 0.0–0.2)
Basos: 0 %
EOS (ABSOLUTE): 0.1 10*3/uL (ref 0.0–0.4)
Eos: 1 %
Hematocrit: 40 % (ref 34.0–46.6)
Hemoglobin: 11.6 g/dL (ref 11.1–15.9)
Immature Grans (Abs): 0.1 10*3/uL (ref 0.0–0.1)
Immature Granulocytes: 1 %
Lymphocytes Absolute: 3.6 10*3/uL — ABNORMAL HIGH (ref 0.7–3.1)
Lymphs: 26 %
MCH: 21.3 pg — ABNORMAL LOW (ref 26.6–33.0)
MCHC: 29 g/dL — ABNORMAL LOW (ref 31.5–35.7)
MCV: 74 fL — ABNORMAL LOW (ref 79–97)
Monocytes Absolute: 0.9 10*3/uL (ref 0.1–0.9)
Monocytes: 6 %
Neutrophils Absolute: 9.5 10*3/uL — ABNORMAL HIGH (ref 1.4–7.0)
Neutrophils: 66 %
Platelets: 454 10*3/uL — ABNORMAL HIGH (ref 150–450)
RBC: 5.44 x10E6/uL — ABNORMAL HIGH (ref 3.77–5.28)
RDW: 17 % — ABNORMAL HIGH (ref 11.7–15.4)
WBC: 14.3 10*3/uL — ABNORMAL HIGH (ref 3.4–10.8)

## 2020-06-30 ENCOUNTER — Other Ambulatory Visit: Payer: Self-pay | Admitting: Nurse Practitioner

## 2020-07-01 ENCOUNTER — Ambulatory Visit: Payer: Self-pay | Admitting: Pharmacist

## 2020-07-11 ENCOUNTER — Other Ambulatory Visit: Payer: Self-pay

## 2020-07-11 ENCOUNTER — Ambulatory Visit (INDEPENDENT_AMBULATORY_CARE_PROVIDER_SITE_OTHER): Payer: Medicare HMO | Admitting: Pharmacist

## 2020-07-11 DIAGNOSIS — E1165 Type 2 diabetes mellitus with hyperglycemia: Secondary | ICD-10-CM

## 2020-07-11 DIAGNOSIS — G720 Drug-induced myopathy: Secondary | ICD-10-CM

## 2020-07-11 NOTE — Progress Notes (Signed)
07/11/2020 Name: Alexandra Hunt MRN: [id number] DOB: 08/07/1945   S:  75 yoF Presents for diabetes evaluation, education, and management Patient was referred and last seen by Primary Care Provider on 06/23/20.  InsInsurance coverage/medication affordability: human[employer] medicare  Patient denies adherence with medications.  Current diabetes medications include: toujeo, novolin R, metformin (does not take as prescribed), farxiga  Current hypertension medications include: lisinopril Goal 130/80  Current hyperlipidemia medications include: fenofibrate ? Patient with myaglias to statins, intolerance documented in the EMR, DX: G72   Patient denies hypoglycemic events.   Patient reported dietary habits: Eats 2-3 meals/day  Discussed meal planning options and Plate method for healthy eating  Avoid sugary drinks and desserts  Incorporate balanced protein, non starchy veggies, 1 serving of carbohydrate  Increase water intake  Increase physical activity as able.  Patient-reported exercise habits: n/a O:  Lab Results  Component Value Date   HGBA1C 8.6 (H) 06/23/2020     Lipid Panel     Component Value Date/Time   CHOL 156 06/23/2020 1504   CHOL 145 08/30/2012 1422   TRIG 260 (H) 06/23/2020 1504   TRIG 314 (H) 10/05/2013 1526   TRIG 184 (H) 08/30/2012 1422   HDL 28 (L) 06/23/2020 1504   HDL 30 (L) 10/05/2013 1526   HDL 26 (L) 08/30/2012 1422   CHOLHDL 5.6 (H) 06/23/2020 1504   LDLCALC 85 06/23/2020 1504   LDLCALC 68 10/05/2013 1526   LDLCALC 82 08/30/2012 1422    Home fasting blood sugars: n/a  2 hour post-meal/random blood sugars: n/a.    Clinical Atherosclerotic Cardiovascular Disease (ASCVD): No   The 10-year ASCVD risk score Mikey Bussing DC Jr., et al., 2013) is: 30.6%   Values used to calculate the score:     Age: 75 years     Sex: Female     Is Non-Hispanic African American: No     Diabetic: Yes     Tobacco smoker: No     Systolic Blood Pressure: 884 mmHg      Is BP treated: Yes     HDL Cholesterol: 28 mg/dL     Total Cholesterol: 156 mg/dL    A/P:  Diabetes T2DM currently uncontrolled. Patient is able to verbalize appropriate hypoglycemia management plan. Patient is not adherent with medication. Control is suboptimal due to diet/lifestyle/noncompliance.   Starting rybelsus 3mg  #30 tabs --sample given   Denies personal and family history of Medullary thyroid cancer (MTC)  Take 30 min before eating, drinking or taking other medications  -Continued basal insulin Toujeo 85 units twice daily  -Continue Novolin R -- inject 22 units 2-4 times a day per sliding scale (not taking as prescribed)  -Continue Metformin--> she reports not taking this as prescribed  -Continued SGLT2-I Farxiga (generic name dapagliflozin) 10mg  daily             CounseledSick day rules: ifsick, vomiting, have diarrhea, or              cannot drink enough fluids, you should stop taking SGLT-2 inhibitors until your symptoms go away. In rare cases, these medicines can cause diabetic ketoacidosis (DKA). DKA is acid buildup in the blood.  -Extensively discussed pathophysiology of diabetes, recommended lifestyle interventions, dietary effects on blood sugar control  -Counseled on s/sx of and management of hypoglycemia  Written patient instructions provided.  Total time in face to face counseling 30 minutes.   Follow up Pharmacist Clinic Visit ON 08/01/20.   Regina Eck, PharmD, BCPS Clinical  Pharmacist, [place]  [facility] Phone [phone]

## 2020-07-22 ENCOUNTER — Encounter: Payer: Self-pay | Admitting: Pharmacist

## 2020-07-25 ENCOUNTER — Other Ambulatory Visit: Payer: Self-pay | Admitting: Nurse Practitioner

## 2020-07-25 DIAGNOSIS — F411 Generalized anxiety disorder: Secondary | ICD-10-CM

## 2020-07-30 ENCOUNTER — Telehealth: Payer: Self-pay | Admitting: Nurse Practitioner

## 2020-07-30 NOTE — Telephone Encounter (Signed)
Marcie Bal called from [place] trying to get info on if we received paperwork from a pharmaceutical rep regarding pt's Novalin Insulin. Says they have some of the paperwork but are missing a few pages.  Please call Marcie Bal at [phone]

## 2020-07-30 NOTE — Telephone Encounter (Signed)
TC to Marcie Bal, there are still papers back on providers desk

## 2020-07-31 DIAGNOSIS — E1165 Type 2 diabetes mellitus with hyperglycemia: Secondary | ICD-10-CM | POA: Diagnosis not present

## 2020-08-01 ENCOUNTER — Ambulatory Visit (INDEPENDENT_AMBULATORY_CARE_PROVIDER_SITE_OTHER): Payer: Medicare HMO | Admitting: Pharmacist

## 2020-08-01 DIAGNOSIS — E1165 Type 2 diabetes mellitus with hyperglycemia: Secondary | ICD-10-CM

## 2020-08-01 NOTE — Progress Notes (Signed)
    08/01/2020 Name: Alexandra Hunt MRN: [id number] DOB: 09/26/1945   S:  75 yoF Presents for diabetes evaluation, education, and management Patient was referred and last seen by Primary Care Provider on 06/23/20.   InsInsurance coverage/medication affordability: human[employer] medicare   Patient denies adherence with medications. Current diabetes medications include: toujeo, novolin R, metformin (does not take as prescribed), farxiga Current hypertension medications include: lisinopril Goal 130/80 Current hyperlipidemia medications include: fenofibrate Patient with myaglias to statins, intolerance documented in the EMR, DX: G72   Patient denies hypoglycemic events.   Patient reported dietary habits: Eats 2-3 meals/day Discussed meal planning options and Plate method for healthy eating Avoid sugary drinks and desserts Incorporate balanced protein, non starchy veggies, 1 serving of carbohydrate Increase water intake Increase physical activity as able.   Patient-reported exercise habits: n/a   O:  Lab Results  Component Value Date   HGBA1C 8.6 (H) 06/23/2020    Lipid Panel     Component Value Date/Time   CHOL 156 06/23/2020 1504   CHOL 145 08/30/2012 1422   TRIG 260 (H) 06/23/2020 1504   TRIG 314 (H) 10/05/2013 1526   TRIG 184 (H) 08/30/2012 1422   HDL 28 (L) 06/23/2020 1504   HDL 30 (L) 10/05/2013 1526   HDL 26 (L) 08/30/2012 1422   CHOLHDL 5.6 (H) 06/23/2020 1504   LDLCALC 85 06/23/2020 1504   LDLCALC 68 10/05/2013 1526   LDLCALC 82 08/30/2012 1422    Home fasting blood sugars: 200s  2 hour post-meal/random blood sugars: n/a.    Clinical Atherosclerotic Cardiovascular Disease (ASCVD): No   The 10-year ASCVD risk score Mikey Bussing DC Jr., et al., 2013) is: 30.6%   Values used to calculate the score:     Age: 75 years     Sex: Female     Is Non-Hispanic African American: No     Diabetic: Yes     Tobacco smoker: No     Systolic Blood Pressure: 740 mmHg     Is BP treated:  Yes     HDL Cholesterol: 28 mg/dL     Total Cholesterol: 156 mg/dL    A/P:  Diabetes T2DM currently uncontrolled. Patient is able to verbalize appropriate hypoglycemia management plan. Patient is not adherent with medication. Control is suboptimal due to diet/lifestyle/noncompliance.  Patient would like to hold metformin since she is taking multiple medications  Patient states she will start the Rybelsus tomorrow morning; she has not taken Rybelsus yet due to reading the side effects  Reviewed potential side effects and explained to patient that every medicine will come with a list of reported side effects, but often patient's will not experience these and the benefit outweighs the risk  Denies personal and family history of Medullary thyroid cancer (MTC)  Continue all other medications as prescribed  -Extensively discussed pathophysiology of diabetes, recommended lifestyle interventions, dietary effects on blood sugar control  -Counseled on s/sx of and management of hypoglycemia   Written patient instructions provided.  Total time in counseling 25 minutes.    Regina Eck, PharmD, BCPS Clinical Pharmacist, [place]  [facility] Phone [phone]

## 2020-08-07 ENCOUNTER — Encounter: Payer: Self-pay | Admitting: Pharmacist

## 2020-08-12 DIAGNOSIS — H40023 Open angle with borderline findings, high risk, bilateral: Secondary | ICD-10-CM | POA: Diagnosis not present

## 2020-08-12 DIAGNOSIS — H524 Presbyopia: Secondary | ICD-10-CM | POA: Diagnosis not present

## 2020-08-12 DIAGNOSIS — H353132 Nonexudative age-related macular degeneration, bilateral, intermediate dry stage: Secondary | ICD-10-CM | POA: Diagnosis not present

## 2020-08-12 DIAGNOSIS — H35372 Puckering of macula, left eye: Secondary | ICD-10-CM | POA: Diagnosis not present

## 2020-08-12 DIAGNOSIS — H25013 Cortical age-related cataract, bilateral: Secondary | ICD-10-CM | POA: Diagnosis not present

## 2020-08-12 DIAGNOSIS — H2513 Age-related nuclear cataract, bilateral: Secondary | ICD-10-CM | POA: Diagnosis not present

## 2020-08-12 DIAGNOSIS — E113291 Type 2 diabetes mellitus with mild nonproliferative diabetic retinopathy without macular edema, right eye: Secondary | ICD-10-CM | POA: Diagnosis not present

## 2020-08-12 DIAGNOSIS — E113212 Type 2 diabetes mellitus with mild nonproliferative diabetic retinopathy with macular edema, left eye: Secondary | ICD-10-CM | POA: Diagnosis not present

## 2020-09-24 ENCOUNTER — Telehealth: Payer: Self-pay | Admitting: Nurse Practitioner

## 2020-09-25 NOTE — Telephone Encounter (Signed)
Needs to just change appointment- needs face to face visit with labs

## 2020-09-25 NOTE — Telephone Encounter (Signed)
Spoke with patient, she is unable to come in to the office now because her son is very sick and she must be with him. She will call back to reschedule later.

## 2020-09-26 ENCOUNTER — Ambulatory Visit: Payer: Self-pay | Admitting: Nurse Practitioner

## 2020-10-02 ENCOUNTER — Encounter: Payer: Self-pay | Admitting: Nurse Practitioner

## 2020-10-09 ENCOUNTER — Telehealth: Payer: Self-pay | Admitting: Nurse Practitioner

## 2020-10-09 NOTE — Telephone Encounter (Signed)
Is this ok to do? Please advise

## 2020-10-10 NOTE — Telephone Encounter (Signed)
Can she come for labs and then do a televisit with you?

## 2020-10-10 NOTE — Telephone Encounter (Signed)
If she is goimg to come in for labs then she needs to be seen

## 2020-10-10 NOTE — Telephone Encounter (Signed)
No she needs to be seen as well.

## 2020-10-13 NOTE — Telephone Encounter (Signed)
Patient notified and verbalized understanding. She states that she is out of town taking care of her son that had to have brain surgery unexpectedly. He is having another surgery this coming Thursday so she is not sure when she will back in town. Advised I would let PCP know

## 2020-10-24 DIAGNOSIS — M542 Cervicalgia: Secondary | ICD-10-CM | POA: Diagnosis not present

## 2020-10-24 DIAGNOSIS — M25531 Pain in right wrist: Secondary | ICD-10-CM | POA: Diagnosis not present

## 2020-10-30 ENCOUNTER — Other Ambulatory Visit: Payer: Self-pay | Admitting: Physician Assistant

## 2020-10-30 DIAGNOSIS — M542 Cervicalgia: Secondary | ICD-10-CM

## 2020-11-09 ENCOUNTER — Ambulatory Visit
Admission: RE | Admit: 2020-11-09 | Discharge: 2020-11-09 | Disposition: A | Discharge status: Home / Self Care | Payer: Medicare HMO | Source: Ambulatory Visit | Attending: Physician Assistant | Admitting: Physician Assistant

## 2020-11-09 ENCOUNTER — Other Ambulatory Visit: Payer: Self-pay

## 2020-11-09 DIAGNOSIS — M542 Cervicalgia: Secondary | ICD-10-CM

## 2020-11-09 DIAGNOSIS — M4802 Spinal stenosis, cervical region: Secondary | ICD-10-CM | POA: Diagnosis not present

## 2020-11-10 DIAGNOSIS — M1711 Unilateral primary osteoarthritis, right knee: Secondary | ICD-10-CM | POA: Diagnosis not present

## 2020-11-10 DIAGNOSIS — M65332 Trigger finger, left middle finger: Secondary | ICD-10-CM | POA: Diagnosis not present

## 2020-11-10 DIAGNOSIS — M79641 Pain in right hand: Secondary | ICD-10-CM | POA: Diagnosis not present

## 2020-11-10 DIAGNOSIS — M79642 Pain in left hand: Secondary | ICD-10-CM | POA: Diagnosis not present

## 2020-11-16 ENCOUNTER — Other Ambulatory Visit: Payer: Self-pay | Admitting: Nurse Practitioner

## 2020-11-18 ENCOUNTER — Other Ambulatory Visit: Payer: Self-pay | Admitting: *Deleted

## 2020-11-18 MED ORDER — "INSULIN SYRINGE-NEEDLE U-100 31G X 5/16"" 1 ML MISC": 11 refills | Status: DC

## 2020-11-20 ENCOUNTER — Telehealth: Payer: Self-pay | Admitting: Nurse Practitioner

## 2020-11-20 MED ORDER — BD PEN NEEDLE MINI U/F 31G X 5 MM MISC
3 refills | Status: DC
Start: 2020-11-20 — End: 2022-05-26

## 2020-11-20 NOTE — Telephone Encounter (Signed)
Sent to correct pharm 

## 2020-11-24 DIAGNOSIS — G5603 Carpal tunnel syndrome, bilateral upper limbs: Secondary | ICD-10-CM | POA: Diagnosis not present

## 2020-11-24 DIAGNOSIS — G5621 Lesion of ulnar nerve, right upper limb: Secondary | ICD-10-CM | POA: Diagnosis not present

## 2020-11-26 ENCOUNTER — Other Ambulatory Visit: Payer: Self-pay | Admitting: Nurse Practitioner

## 2020-11-28 ENCOUNTER — Encounter: Payer: Self-pay | Admitting: Nurse Practitioner

## 2020-11-28 ENCOUNTER — Telehealth: Payer: Self-pay | Admitting: Pharmacist

## 2020-11-28 ENCOUNTER — Other Ambulatory Visit: Payer: Self-pay

## 2020-11-28 ENCOUNTER — Ambulatory Visit (INDEPENDENT_AMBULATORY_CARE_PROVIDER_SITE_OTHER): Payer: Medicare HMO | Admitting: Nurse Practitioner

## 2020-11-28 ENCOUNTER — Other Ambulatory Visit: Payer: Self-pay | Admitting: Nurse Practitioner

## 2020-11-28 VITALS — BP 109/52 | HR 84 | Temp 97.7°F | Resp 20 | Ht 64.0 in | Wt 206.0 lb

## 2020-11-28 DIAGNOSIS — E782 Mixed hyperlipidemia: Secondary | ICD-10-CM | POA: Diagnosis not present

## 2020-11-28 DIAGNOSIS — F411 Generalized anxiety disorder: Secondary | ICD-10-CM | POA: Diagnosis not present

## 2020-11-28 DIAGNOSIS — M8588 Other specified disorders of bone density and structure, other site: Secondary | ICD-10-CM

## 2020-11-28 DIAGNOSIS — N393 Stress incontinence (female) (male): Secondary | ICD-10-CM

## 2020-11-28 DIAGNOSIS — I10 Essential (primary) hypertension: Secondary | ICD-10-CM | POA: Diagnosis not present

## 2020-11-28 DIAGNOSIS — Z23 Encounter for immunization: Secondary | ICD-10-CM | POA: Diagnosis not present

## 2020-11-28 DIAGNOSIS — R7989 Other specified abnormal findings of blood chemistry: Secondary | ICD-10-CM

## 2020-11-28 DIAGNOSIS — E1165 Type 2 diabetes mellitus with hyperglycemia: Secondary | ICD-10-CM | POA: Diagnosis not present

## 2020-11-28 DIAGNOSIS — G4733 Obstructive sleep apnea (adult) (pediatric): Secondary | ICD-10-CM

## 2020-11-28 DIAGNOSIS — F339 Major depressive disorder, recurrent, unspecified: Secondary | ICD-10-CM

## 2020-11-28 LAB — CMP14+EGFR
ALT: 12 IU/L (ref 0–32)
AST: 19 IU/L (ref 0–40)
Albumin/Globulin Ratio: 1.3 (ref 1.2–2.2)
Albumin: 3.9 g/dL (ref 3.7–4.7)
Alkaline Phosphatase: 73 IU/L (ref 44–121)
BUN/Creatinine Ratio: 27 (ref 12–28)
BUN: 29 mg/dL — ABNORMAL HIGH (ref 8–27)
Bilirubin Total: <0.2 mg/dL (ref 0.0–1.2)
CO2: 21 mmol/L (ref 20–29)
Calcium: 9.2 mg/dL (ref 8.7–10.3)
Chloride: 104 mmol/L (ref 96–106)
Creatinine, Ser: 1.09 mg/dL — ABNORMAL HIGH (ref 0.57–1.00)
Globulin, Total: 2.9 g/dL (ref 1.5–4.5)
Glucose: 155 mg/dL — ABNORMAL HIGH (ref 70–99)
Potassium: 4.4 mmol/L (ref 3.5–5.2)
Sodium: 142 mmol/L (ref 134–144)
Total Protein: 6.8 g/dL (ref 6.0–8.5)
eGFR: 54 mL/min/{1.73_m2} — ABNORMAL LOW (ref >59)

## 2020-11-28 LAB — CBC WITH DIFFERENTIAL/PLATELET
Basophils Absolute: 0.1 10*3/uL (ref 0.0–0.2)
Basos: 0 %
EOS (ABSOLUTE): 0.2 10*3/uL (ref 0.0–0.4)
Eos: 1 %
Hematocrit: 35.7 % (ref 34.0–46.6)
Hemoglobin: 11.1 g/dL (ref 11.1–15.9)
Immature Grans (Abs): 0 10*3/uL (ref 0.0–0.1)
Immature Granulocytes: 0 %
Lymphocytes Absolute: 3.7 10*3/uL — ABNORMAL HIGH (ref 0.7–3.1)
Lymphs: 28 %
MCH: 22.6 pg — ABNORMAL LOW (ref 26.6–33.0)
MCHC: 31.1 g/dL — ABNORMAL LOW (ref 31.5–35.7)
MCV: 73 fL — ABNORMAL LOW (ref 79–97)
Monocytes Absolute: 0.8 10*3/uL (ref 0.1–0.9)
Monocytes: 6 %
Neutrophils Absolute: 8.7 10*3/uL — ABNORMAL HIGH (ref 1.4–7.0)
Neutrophils: 65 %
Platelets: 395 10*3/uL (ref 150–450)
RBC: 4.92 x10E6/uL (ref 3.77–5.28)
RDW: 15.6 % — ABNORMAL HIGH (ref 11.7–15.4)
WBC: 13.4 10*3/uL — ABNORMAL HIGH (ref 3.4–10.8)

## 2020-11-28 LAB — LIPID PANEL
Chol/HDL Ratio: 5.1 ratio — ABNORMAL HIGH (ref 0.0–4.4)
Cholesterol, Total: 143 mg/dL (ref 100–199)
HDL: 28 mg/dL — ABNORMAL LOW (ref >39)
LDL Chol Calc (NIH): 72 mg/dL (ref 0–99)
Triglycerides: 263 mg/dL — ABNORMAL HIGH (ref 0–149)
VLDL Cholesterol Cal: 43 mg/dL — ABNORMAL HIGH (ref 5–40)

## 2020-11-28 LAB — BAYER DCA HB A1C WAIVED: HB A1C (BAYER DCA - WAIVED): 8.3 % — ABNORMAL HIGH (ref 4.8–5.6)

## 2020-11-28 MED ORDER — FENOFIBRATE 160 MG PO TABS: 160.0000 mg | ORAL_TABLET | Freq: Every day | ORAL | 1 refills | Status: DC

## 2020-11-28 MED ORDER — TOLTERODINE TARTRATE ER 4 MG PO CP24: 4.0000 mg | ORAL_CAPSULE | Freq: Every day | ORAL | 3 refills | Status: DC

## 2020-11-28 MED ORDER — LISINOPRIL 20 MG PO TABS: 20.0000 mg | ORAL_TABLET | Freq: Every day | ORAL | 1 refills | Status: DC

## 2020-11-28 MED ORDER — DAPAGLIFLOZIN PROPANEDIOL 5 MG PO TABS: 5.0000 mg | ORAL_TABLET | Freq: Every day | ORAL | 0 refills | Status: DC

## 2020-11-28 MED ORDER — LORAZEPAM 1 MG PO TABS: 1.0000 mg | ORAL_TABLET | Freq: Three times a day (TID) | ORAL | 2 refills | Status: DC

## 2020-11-28 MED ORDER — SERTRALINE HCL 100 MG PO TABS: 200.0000 mg | ORAL_TABLET | Freq: Every day | ORAL | 1 refills | Status: DC

## 2020-11-28 MED ORDER — TOUJEO SOLOSTAR 300 UNIT/ML ~~LOC~~ SOPN: 85.0000 [IU] | PEN_INJECTOR | Freq: Two times a day (BID) | SUBCUTANEOUS | 5 refills | Status: DC

## 2020-11-28 MED ORDER — METFORMIN HCL 500 MG PO TABS: 500.0000 mg | ORAL_TABLET | Freq: Two times a day (BID) | ORAL | 1 refills | Status: DC

## 2020-11-28 MED ORDER — FUROSEMIDE 20 MG PO TABS: ORAL_TABLET | ORAL | 1 refills | Status: DC

## 2020-11-28 NOTE — Progress Notes (Signed)
Subjective:    Patient ID: Alexandra Hunt, female    DOB: 1945-12-26, 74 y.o.   MRN: [id number]  Chief Complaint: Medical Management of Chronic Issues    HPI:  1. Uncontrolled type 2 diabetes mellitus with hyperglycemia (HCC) Fasting blood sugars have improved some. Running around 130-200. She has had no low blood sugars. She has a libre now which has made her ore accountable Last HGBA1c was 8.6.   2. Essential hypertension, malignant No c/o chest pain, sob or headache. Doe snot check blood pressure at home. BP Readings from Last 3 Encounters:  11/28/20 (!) 109/52  06/23/20 134/62  03/04/20 (!) 115/54     3. Mixed hyperlipidemia Tries to watch diet but does no exercise. Lab Results  Component Value Date   CHOL 156 06/23/2020   HDL 28 (L) 06/23/2020   LDLCALC 85 06/23/2020   TRIG 260 (H) 06/23/2020   CHOLHDL 5.6 (H) 06/23/2020     4. Obstructive sleep apnea syndrome Doe snot wear CPAP. She says it is broken  5. Episode of recurrent major depressive disorder, unspecified depression episode severity ([place]) Is currently on zoloft and says she is doing some better. Depression screen [facility] 2/9 06/23/2020 03/05/2020 03/04/2020  Decreased Interest _0 Down, Depressed, Hopeless _1 PHQ - 2 Score _2 Altered sleeping _3 Tired, decreased energy _4 Change in appetite 0 0 1  Feeling bad or failure about yourself  _5 Trouble concentrating 0 0 0  Moving slowly or fidgety/restless 0 0 0  Suicidal thoughts 0 0 0  PHQ-9 Score _6 Difficult doing work/chores Not difficult at all Very difficult Not difficult at all  Some recent data might be hidden     6. GAD (generalized anxiety disorder) Is on ativan TID . Still has lots of anxiety GAD 7 : Generalized Anxiety Score 06/23/2020 03/04/2020 07/31/2019 04/10/2019  Nervous, Anxious, on Edge _7 Control/stop worrying _8 Worry too much - different things _9 Trouble relaxing 1 0 3 0  Restless 0 0 2 0   Easily annoyed or irritable 0 1 3 0  Afraid - awful might happen 3 1 0 0  Total GAD 7 Score _10 Anxiety Difficulty Not difficult at all Somewhat difficult - Somewhat difficult      7. Low serum vitamin D Takes vitamin d supplement daily  8. Osteopenia of lumbar spine Last dexascan was done on 09/16/15. She doe snot wantb to do anymore of these    Outpatient Encounter Medications as of 11/28/2020  Medication Sig   albuterol (VENTOLIN HFA) 108 (90 Base) MCG/ACT inhaler Inhale 2 puffs into the lungs every 6 (six) hours as needed for wheezing or shortness of breath.   Alcohol Swabs (B-D SINGLE USE SWABS REGULAR) PADS TEST BS UP TO FOUR TIMES DAILY Dx E11.9   aspirin 81 MG tablet Take 81 mg by mouth daily.   Blood Glucose Calibration (TRUE METRIX LEVEL 2) Normal SOLN Use with glucose machine Dx E11.9   Blood Glucose Monitoring Suppl (TRUE METRIX METER) w/Device KIT TEST BS UP TO FOUR TIMES DAILY Dx E11.9   Continuous Blood Gluc Receiver (FREESTYLE LIBRE 2 READER) DEVI Use to test blood sugar 4-6 times daily as directed DX: E11.9   Continuous Blood Gluc Sensor (FREESTYLE LIBRE 2 SENSOR) MISC Use to test  blood sugar 4-6 times daily as directed DX: E11.9   dapagliflozin propanediol (FARXIGA) 5 MG TABS tablet Take 1 tablet (5 mg total) by mouth daily.   diclofenac (VOLTAREN) 75 MG EC tablet TAKE 1 TABLET BY MOUTH TWICE A DAY AS NEEDED   dorzolamide (TRUSOPT) 2 % ophthalmic solution Place 2 drops into both eyes 2 (two) times daily.   fenofibrate 160 MG tablet Take 1 tablet (160 mg total) by mouth daily.   fluticasone (FLONASE) 50 MCG/ACT nasal spray Place 2 sprays into both nostrils daily.   furosemide (LASIX) 20 MG tablet TAKE 1 TABLET BY MOUTH EVERY DAY   glucose blood (TRUE METRIX BLOOD GLUCOSE TEST) test strip TEST BS UP TO FOUR TIMES DAILY Dx E11.9   ibuprofen (ADVIL,MOTRIN) 800 MG tablet TAKE 1 TABLET BY MOUTH EVERY 8 HOURS AS NEEDED   insulin glargine, 1 Unit Dial, (TOUJEO  SOLOSTAR) 300 UNIT/ML Solostar Pen Inject 85 Units into the skin in the morning and at bedtime.   Insulin Pen Needle (B-D UF III MINI PEN NEEDLES) 31G X 5 MM MISC USE WITH SLIDING SCALE HUMALOG 4 TIMES DAILY DX: E11.9   insulin regular (NOVOLIN R) 100 units/mL injection INJECT 22UNITS 4 TIMES A DAY PER SLIDING SCALE   Insulin Syringe-Needle U-100 (B-D INS SYR ULTRAFINE 1CC/31G) 31G X 5/16" 1 ML MISC USE DAILY WITH LANTUS AND HUMALOG INSULIN SLIDING SCALES AS INSTRUCTED Dx E11.9   Lancet Device MISC USE TO CHECK BLOOD SUGAR TWICE DAILY AND AS NEEDED  E11.9   lisinopril (ZESTRIL) 20 MG tablet Take 1 tablet (20 mg total) by mouth daily.   LORazepam (ATIVAN) 1 MG tablet Take 1 tablet (1 mg total) by mouth 3 (three) times daily.   meclizine (ANTIVERT) 25 MG tablet Take 1 tablet (25 mg total) by mouth 3 (three) times daily as needed for dizziness.   mupirocin cream (BACTROBAN) 2 % Apply 1 application topically 2 (two) times daily.   ondansetron (ZOFRAN) 4 MG tablet    sertraline (ZOLOFT) 100 MG tablet Take 2 tablets (200 mg total) by mouth daily.   silver sulfADIAZINE (SILVADENE) 1 % cream Apply 1 application topically 2 (two) times daily.   tolterodine (DETROL LA) 4 MG 24 hr capsule Take 1 capsule (4 mg total) by mouth daily.   TRUEplus Lancets 30G MISC TEST BS UP TO FOUR TIMES DAILY Dx E11.9   Vitamin D, Ergocalciferol, (DRISDOL) 1.25 MG (50000 UNIT) CAPS capsule TAKE 1 CAPSULE EVERY 7 DAYS   [DISCONTINUED] Semaglutide (RYBELSUS) 3 MG TABS Take 3 mg by mouth daily.   magic mouthwash SOLN Take 5 mLs by mouth 4 (four) times daily. (Patient not taking: Reported on 11/28/2020)   metFORMIN (GLUCOPHAGE) 500 MG tablet Take 1 tablet (500 mg total) by mouth 2 (two) times daily with a meal. (Patient not taking: Reported on 11/28/2020)   No facility-administered encounter medications on file as of 11/28/2020.    Past Surgical History:  Procedure Laterality Date   ABDOMINAL HYSTERECTOMY     due to uterine  cancer   BREAST SURGERY     left breast biopsy/benign   TUBAL LIGATION      Family History  Problem Relation Age of Onset   Diabetes Mother    Blindness Mother        related to diabetes   Stroke Mother    Heart disease Father        MI   Heart attack Father    Heart defect Father  Breast cancer Sister    Diabetes Brother        diet controlled   Colon cancer Neg Hx    Esophageal cancer Neg Hx    Rectal cancer Neg Hx    Stomach cancer Neg Hx     New complaints: Seeing ortho for chronic back pain with oniched nerve.  Social history: Has a house full of kids and grandkids- this contributes to her anxiety  Controlled substance contract: 11/28/20     Review of Systems  Constitutional:  Negative for diaphoresis.  Eyes:  Negative for pain.  Respiratory:  Negative for shortness of breath.   Cardiovascular:  Negative for chest pain, palpitations and leg swelling.  Gastrointestinal:  Negative for abdominal pain.  Endocrine: Negative for polydipsia.  Skin:  Negative for rash.  Neurological:  Negative for dizziness, weakness and headaches.  Hematological:  Does not bruise/bleed easily.  All other systems reviewed and are negative.     Objective:   Physical Exam Vitals and nursing note reviewed.  Constitutional:      General: She is not in acute distress.    Appearance: Normal appearance. She is well-developed.  HENT:     Head: Normocephalic.     Right Ear: Tympanic membrane normal.     Left Ear: Tympanic membrane normal.     Nose: Nose normal.     Mouth/Throat:     Mouth: Mucous membranes are moist.  Eyes:     Pupils: Pupils are equal, round, and reactive to light.  Neck:     Vascular: No carotid bruit or JVD.  Cardiovascular:     Rate and Rhythm: Normal rate and regular rhythm.     Heart sounds: Normal heart sounds.  Pulmonary:     Effort: Pulmonary effort is normal. No respiratory distress.     Breath sounds: Normal breath sounds. No wheezing or rales.   Chest:     Chest wall: No tenderness.  Abdominal:     General: Bowel sounds are normal. There is no distension or abdominal bruit.     Palpations: Abdomen is soft. There is no hepatomegaly, splenomegaly, mass or pulsatile mass.     Tenderness: There is no abdominal tenderness.  Musculoskeletal:        General: Normal range of motion.     Cervical back: Normal range of motion and neck supple.  Lymphadenopathy:     Cervical: No cervical adenopathy.  Skin:    General: Skin is warm and dry.  Neurological:     Mental Status: She is alert and oriented to person, place, and time.     Deep Tendon Reflexes: Reflexes are normal and symmetric.  Psychiatric:        Behavior: Behavior normal.        Thought Content: Thought content normal.        Judgment: Judgment normal.   BP (!) 109/52   Pulse 84   Temp 97.7 F (36.5 C) (Temporal)   Resp 20   Ht $R'5\' 4"'iO$  (1.626 m)   Wt 206 lb (93.4 kg)   SpO2 95%   BMI 35.36 kg/m   HGBA1c 8.3%       Assessment & Plan:  Alexandra Hunt comes in today with chief complaint of Medical Management of Chronic Issues   Diagnosis and orders addressed:  1. Uncontrolled type 2 diabetes mellitus with hyperglycemia (HCC) Continue to watch carbs on diet - Bayer DCA Hb A1c Waived - Microalbumin / creatinine urine ratio - metFORMIN (GLUCOPHAGE)  500 MG tablet; Take 1 tablet (500 mg total) by mouth 2 (two) times daily with a meal.  Dispense: 180 tablet; Refill: 1 - dapagliflozin propanediol (FARXIGA) 5 MG TABS tablet; Take 1 tablet (5 mg total) by mouth daily.  Dispense: 30 tablet; Refill: 0 - insulin glargine, 1 Unit Dial, (TOUJEO SOLOSTAR) 300 UNIT/ML Solostar Pen; Inject 85 Units into the skin in the morning and at bedtime.  Dispense: 12 mL; Refill: 5  2. Essential hypertension, malignant Low sodium diet - CBC with Differential/Platelet - CMP14+EGFR - lisinopril (ZESTRIL) 20 MG tablet; Take 1 tablet (20 mg total) by mouth daily.  Dispense: 90 tablet;  Refill: 1 - furosemide (LASIX) 20 MG tablet; TAKE 1 TABLET BY MOUTH EVERY DAY  Dispense: 90 tablet; Refill: 1  3. Mixed hyperlipidemia Low fat diet - Lipid panel - fenofibrate 160 MG tablet; Take 1 tablet (160 mg total) by mouth daily.  Dispense: 90 tablet; Refill: 1  4. Obstructive sleep apnea syndrome Needs to get cpap fixed  5. Episode of recurrent major depressive disorder, unspecified depression episode severity ([place]) Stress management - sertraline (ZOLOFT) 100 MG tablet; Take 2 tablets (200 mg total) by mouth daily.  Dispense: 180 tablet; Refill: 1  6. GAD (generalized anxiety disorder) - LORazepam (ATIVAN) 1 MG tablet; Take 1 tablet (1 mg total) by mouth 3 (three) times daily.  Dispense: 90 tablet; Refill: 2  7. Low serum vitamin D Continue daily vitamin d supplement  8. Osteopenia of lumbar spine Weight bearoimg exercises encouraged  9. Stress incontinence of urine - tolterodine (DETROL LA) 4 MG 24 hr capsule; Take 1 capsule (4 mg total) by mouth daily.  Dispense: 30 capsule; Refill: 3   Labs pending Health Maintenance reviewed Diet and exercise encouraged  Follow up plan: 3 months   Mary-Margaret Hassell Done, FNP

## 2020-11-28 NOTE — Telephone Encounter (Signed)
Send libre rx to [place] [facility] per insurance

## 2020-11-28 NOTE — Patient Instructions (Signed)

## 2020-11-29 LAB — MICROALBUMIN / CREATININE URINE RATIO
Creatinine, Urine: 81 mg/dL
Microalb/Creat Ratio: 12 mg/g creat (ref 0–29)
Microalbumin, Urine: 10 ug/mL

## 2020-12-01 DIAGNOSIS — G5603 Carpal tunnel syndrome, bilateral upper limbs: Secondary | ICD-10-CM | POA: Diagnosis not present

## 2020-12-01 DIAGNOSIS — G5621 Lesion of ulnar nerve, right upper limb: Secondary | ICD-10-CM | POA: Diagnosis not present

## 2020-12-01 DIAGNOSIS — M79641 Pain in right hand: Secondary | ICD-10-CM | POA: Insufficient documentation

## 2020-12-02 DIAGNOSIS — E1165 Type 2 diabetes mellitus with hyperglycemia: Secondary | ICD-10-CM | POA: Diagnosis not present

## 2020-12-09 ENCOUNTER — Other Ambulatory Visit: Payer: Self-pay | Admitting: Obstetrics & Gynecology

## 2020-12-09 DIAGNOSIS — Z09 Encounter for follow-up examination after completed treatment for conditions other than malignant neoplasm: Secondary | ICD-10-CM

## 2020-12-09 DIAGNOSIS — N6489 Other specified disorders of breast: Secondary | ICD-10-CM

## 2020-12-12 DIAGNOSIS — M542 Cervicalgia: Secondary | ICD-10-CM | POA: Diagnosis not present

## 2020-12-19 DIAGNOSIS — M542 Cervicalgia: Secondary | ICD-10-CM | POA: Insufficient documentation

## 2020-12-26 ENCOUNTER — Other Ambulatory Visit: Payer: Self-pay | Admitting: Nurse Practitioner

## 2020-12-29 ENCOUNTER — Other Ambulatory Visit: Payer: Medicare HMO

## 2021-01-19 ENCOUNTER — Other Ambulatory Visit: Payer: Medicare HMO

## 2021-01-27 DIAGNOSIS — J329 Chronic sinusitis, unspecified: Secondary | ICD-10-CM | POA: Diagnosis not present

## 2021-01-27 DIAGNOSIS — R0989 Other specified symptoms and signs involving the circulatory and respiratory systems: Secondary | ICD-10-CM | POA: Diagnosis not present

## 2021-01-27 DIAGNOSIS — R059 Cough, unspecified: Secondary | ICD-10-CM | POA: Diagnosis not present

## 2021-01-27 DIAGNOSIS — R52 Pain, unspecified: Secondary | ICD-10-CM | POA: Diagnosis not present

## 2021-02-02 DIAGNOSIS — M65332 Trigger finger, left middle finger: Secondary | ICD-10-CM | POA: Diagnosis not present

## 2021-02-02 DIAGNOSIS — G5603 Carpal tunnel syndrome, bilateral upper limbs: Secondary | ICD-10-CM | POA: Diagnosis not present

## 2021-02-16 DIAGNOSIS — H40023 Open angle with borderline findings, high risk, bilateral: Secondary | ICD-10-CM | POA: Diagnosis not present

## 2021-02-16 DIAGNOSIS — H353132 Nonexudative age-related macular degeneration, bilateral, intermediate dry stage: Secondary | ICD-10-CM | POA: Diagnosis not present

## 2021-02-16 DIAGNOSIS — H2513 Age-related nuclear cataract, bilateral: Secondary | ICD-10-CM | POA: Diagnosis not present

## 2021-02-16 DIAGNOSIS — H25013 Cortical age-related cataract, bilateral: Secondary | ICD-10-CM | POA: Diagnosis not present

## 2021-02-16 DIAGNOSIS — E113293 Type 2 diabetes mellitus with mild nonproliferative diabetic retinopathy without macular edema, bilateral: Secondary | ICD-10-CM | POA: Diagnosis not present

## 2021-02-16 LAB — HM DIABETES EYE EXAM

## 2021-02-20 DIAGNOSIS — E1165 Type 2 diabetes mellitus with hyperglycemia: Secondary | ICD-10-CM | POA: Diagnosis not present

## 2021-02-27 ENCOUNTER — Other Ambulatory Visit: Payer: Self-pay | Admitting: Nurse Practitioner

## 2021-03-03 ENCOUNTER — Ambulatory Visit (INDEPENDENT_AMBULATORY_CARE_PROVIDER_SITE_OTHER): Payer: Medicare HMO | Admitting: Nurse Practitioner

## 2021-03-03 ENCOUNTER — Encounter: Payer: Self-pay | Admitting: Nurse Practitioner

## 2021-03-03 VITALS — BP 137/70 | HR 93 | Temp 98.4°F | Resp 20 | Ht 64.0 in | Wt 206.0 lb

## 2021-03-03 DIAGNOSIS — F411 Generalized anxiety disorder: Secondary | ICD-10-CM | POA: Diagnosis not present

## 2021-03-03 DIAGNOSIS — R1902 Left upper quadrant abdominal swelling, mass and lump: Secondary | ICD-10-CM | POA: Diagnosis not present

## 2021-03-03 DIAGNOSIS — R7989 Other specified abnormal findings of blood chemistry: Secondary | ICD-10-CM | POA: Diagnosis not present

## 2021-03-03 DIAGNOSIS — G4733 Obstructive sleep apnea (adult) (pediatric): Secondary | ICD-10-CM | POA: Diagnosis not present

## 2021-03-03 DIAGNOSIS — F339 Major depressive disorder, recurrent, unspecified: Secondary | ICD-10-CM

## 2021-03-03 DIAGNOSIS — Z794 Long term (current) use of insulin: Secondary | ICD-10-CM

## 2021-03-03 DIAGNOSIS — N393 Stress incontinence (female) (male): Secondary | ICD-10-CM

## 2021-03-03 DIAGNOSIS — E1165 Type 2 diabetes mellitus with hyperglycemia: Secondary | ICD-10-CM | POA: Diagnosis not present

## 2021-03-03 DIAGNOSIS — E782 Mixed hyperlipidemia: Secondary | ICD-10-CM

## 2021-03-03 DIAGNOSIS — E1142 Type 2 diabetes mellitus with diabetic polyneuropathy: Secondary | ICD-10-CM

## 2021-03-03 DIAGNOSIS — M8588 Other specified disorders of bone density and structure, other site: Secondary | ICD-10-CM | POA: Diagnosis not present

## 2021-03-03 DIAGNOSIS — I10 Essential (primary) hypertension: Secondary | ICD-10-CM

## 2021-03-03 DIAGNOSIS — Z6832 Body mass index (BMI) 32.0-32.9, adult: Secondary | ICD-10-CM

## 2021-03-03 LAB — BAYER DCA HB A1C WAIVED: HB A1C (BAYER DCA - WAIVED): 8.6 % — ABNORMAL HIGH (ref 4.8–5.6)

## 2021-03-03 MED ORDER — FENOFIBRATE 160 MG PO TABS: 160.0000 mg | ORAL_TABLET | Freq: Every day | ORAL | 1 refills | Status: DC

## 2021-03-03 MED ORDER — TOLTERODINE TARTRATE ER 4 MG PO CP24: 4.0000 mg | ORAL_CAPSULE | Freq: Every day | ORAL | 1 refills | Status: DC

## 2021-03-03 MED ORDER — TOUJEO SOLOSTAR 300 UNIT/ML ~~LOC~~ SOPN: 85.0000 [IU] | PEN_INJECTOR | Freq: Two times a day (BID) | SUBCUTANEOUS | 5 refills | Status: DC

## 2021-03-03 MED ORDER — SERTRALINE HCL 100 MG PO TABS: 200.0000 mg | ORAL_TABLET | Freq: Every day | ORAL | 1 refills | Status: DC

## 2021-03-03 MED ORDER — LORAZEPAM 1 MG PO TABS: 1.0000 mg | ORAL_TABLET | Freq: Three times a day (TID) | ORAL | 2 refills | Status: DC

## 2021-03-03 MED ORDER — INSULIN REGULAR HUMAN 100 UNIT/ML IJ SOLN: INTRAMUSCULAR | 2 refills | Status: DC

## 2021-03-03 MED ORDER — LISINOPRIL 20 MG PO TABS: 20.0000 mg | ORAL_TABLET | Freq: Every day | ORAL | 1 refills | Status: DC

## 2021-03-03 MED ORDER — FUROSEMIDE 20 MG PO TABS: ORAL_TABLET | ORAL | 1 refills | Status: DC

## 2021-03-03 MED ORDER — DAPAGLIFLOZIN PROPANEDIOL 5 MG PO TABS: 5.0000 mg | ORAL_TABLET | Freq: Every day | ORAL | 3 refills | Status: DC

## 2021-03-03 NOTE — Patient Instructions (Signed)

## 2021-03-03 NOTE — Progress Notes (Signed)
Subjective:    Patient ID: Alexandra Hunt, female    DOB: [birth year], 76 y.o.   MRN: [id number]   Chief Complaint: medical management of chronic issues     HPI:  Alexandra Hunt is a 76 y.o. who identifies as a female who was assigned female at birth.   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 Uncontrolled type 2 diabetes mellitus with hyperglycemia (HCC) Fasting in mornings ar erunning from 57-234. She has not really been watching diet. Sh ehas been doing intermittent fasting as diabetic. Lab Results  Component Value Date   HGBA1C 8.3 (H) 11/28/2020     2. Essential hypertension, malignant No c/o chest pain, sob or headache. Does not ckeck blood pressure at home very often BP Readings from Last 3 Encounters:  03/03/21 137/70  11/28/20 (!) 109/52  06/23/20 134/62     3. Mixed hyperlipidemia Doe snot watch diet and does no dedicated exercise. Lab Results  Component Value Date   CHOL 143 11/28/2020   HDL 28 (L) 11/28/2020   LDLCALC 72 11/28/2020   TRIG 263 (H) 11/28/2020   CHOLHDL 5.1 (H) 11/28/2020     4. Obstructive sleep apnea syndrome Doe snot wear cpap- she says it is in the shop  5. GAD (generalized anxiety disorder) Stays stressed with family issues. Takes ativan daily GAD 7 : Generalized Anxiety Score 03/03/2021 06/23/2020 03/04/2020 07/31/2019  Nervous, Anxious, on Edge 3 3 3 3   Control/stop worrying 3 3 3 3   Worry too much - different things 3 3 3 3   Trouble relaxing 1 1 0 3  Restless 0 0 0 2  Easily annoyed or irritable 0 0 1 3  Afraid - awful might happen 0 3 1 0  Total GAD 7 Score 10 13 11 17   Anxiety Difficulty Not difficult at all Not difficult at all Somewhat difficult -      6. Episode of recurrent major depressive disorder, unspecified depression episode severity ([place]) Is on zoloft daily. Still has some  depression Depression screen [facility] 2/9 03/03/2021 06/23/2020 03/05/2020  Decreased Interest 2 3 3   Down, Depressed, Hopeless 2 3 3   PHQ - 2 Score 4 6 6   Altered sleeping 2 3 3   Tired, decreased energy 2 3 3   Change in appetite 0 0 0  Feeling bad or failure about yourself  0 1 1  Trouble concentrating 0 0 0  Moving slowly or fidgety/restless 0 0 0  Suicidal thoughts 0 0 0  PHQ-9 Score 8 13 13   Difficult doing work/chores Not difficult at all Not difficult at all Very difficult  Some recent data might be hidden     7. Low serum vitamin D Is on daily vitamin d supplement  8. Osteopenia of lumbar spine Does no weight bearing exercises. Last dexascan was in 2017.  9. BMI 32.0-32.9,adult No recent weight changes Wt Readings from Last 3 Encounters:  03/03/21 206 lb (93.4 kg)  11/28/20 206 lb (93.4 kg)  06/23/20 204 lb (92.5 kg)   BMI Readings from Last 3 Encounters:  03/03/21 35.36 kg/m  11/28/20 35.36 kg/m  06/23/20 35.02 kg/m      New complaints: Has had a cough for several weeks. Has been takig mucinex  Allergies  Allergen Reactions   Penicillins     SOB,rash   Ivp Dye [Iodinated  Contrast Media]     Burning at IV site   Statins     myalgias   Codeine     nauseated   Latex     rash   Sulfa Antibiotics     nauseated   Outpatient Encounter Medications as of 03/03/2021  Medication Sig   albuterol (VENTOLIN HFA) 108 (90 Base) MCG/ACT inhaler Inhale 2 puffs into the lungs every 6 (six) hours as needed for wheezing or shortness of breath.   Alcohol Swabs (B-D SINGLE USE SWABS REGULAR) PADS TEST BS UP TO FOUR TIMES DAILY Dx E11.9   aspirin 81 MG tablet Take 81 mg by mouth daily.   Blood Glucose Calibration (TRUE METRIX LEVEL 2) Normal SOLN Use with glucose machine Dx E11.9   Blood Glucose Monitoring Suppl (TRUE METRIX METER) w/Device KIT TEST BS UP TO FOUR TIMES DAILY Dx E11.9   Continuous Blood Gluc Receiver (FREESTYLE LIBRE 2 READER) DEVI Use to test blood sugar 4-6  times daily as directed DX: E11.9   Continuous Blood Gluc Sensor (FREESTYLE LIBRE 2 SENSOR) MISC Use to test blood sugar 4-6 times daily as directed DX: E11.9   dapagliflozin propanediol (FARXIGA) 5 MG TABS tablet Take 1 tablet (5 mg total) by mouth daily.   diclofenac (VOLTAREN) 75 MG EC tablet TAKE 1 TABLET BY MOUTH TWICE A DAY AS NEEDED   dorzolamide (TRUSOPT) 2 % ophthalmic solution Place 2 drops into both eyes 2 (two) times daily.   fenofibrate 160 MG tablet Take 1 tablet (160 mg total) by mouth daily.   fluticasone (FLONASE) 50 MCG/ACT nasal spray Place 2 sprays into both nostrils daily.   furosemide (LASIX) 20 MG tablet TAKE 1 TABLET BY MOUTH EVERY DAY   glucose blood (TRUE METRIX BLOOD GLUCOSE TEST) test strip TEST BS UP TO FOUR TIMES DAILY Dx E11.9   ibuprofen (ADVIL,MOTRIN) 800 MG tablet TAKE 1 TABLET BY MOUTH EVERY 8 HOURS AS NEEDED   insulin glargine, 1 Unit Dial, (TOUJEO SOLOSTAR) 300 UNIT/ML Solostar Pen Inject 85 Units into the skin in the morning and at bedtime.   Insulin Pen Needle (B-D UF III MINI PEN NEEDLES) 31G X 5 MM MISC USE WITH SLIDING SCALE HUMALOG 4 TIMES DAILY DX: E11.9   insulin regular (NOVOLIN R) 100 units/mL injection INJECT 22UNITS 4 TIMES A DAY PER SLIDING SCALE   Insulin Syringe-Needle U-100 (TRUEPLUS INSULIN SYRINGE) 31G X 5/16" 1 ML MISC Use daily with Lantus & Humalog insulin sliding scales as instructed Dx E11.9   Lancet Device MISC USE TO CHECK BLOOD SUGAR TWICE DAILY AND AS NEEDED  E11.9   lisinopril (ZESTRIL) 20 MG tablet Take 1 tablet (20 mg total) by mouth daily.   LORazepam (ATIVAN) 1 MG tablet Take 1 tablet (1 mg total) by mouth 3 (three) times daily.   magic mouthwash SOLN Take 5 mLs by mouth 4 (four) times daily. (Patient not taking: Reported on 11/28/2020)   meclizine (ANTIVERT) 25 MG tablet Take 1 tablet (25 mg total) by mouth 3 (three) times daily as needed for dizziness.   metFORMIN (GLUCOPHAGE) 500 MG tablet Take 1 tablet (500 mg total) by mouth  2 (two) times daily with a meal.   mupirocin cream (BACTROBAN) 2 % Apply 1 application topically 2 (two) times daily.   ondansetron (ZOFRAN) 4 MG tablet    sertraline (ZOLOFT) 100 MG tablet Take 2 tablets (200 mg total) by mouth daily.   silver sulfADIAZINE (SILVADENE) 1 % cream Apply 1 application topically 2 (two) times  daily.   tolterodine (DETROL LA) 4 MG 24 hr capsule Take 1 capsule (4 mg total) by mouth daily.   TRUEplus Lancets 30G MISC TEST BS UP TO FOUR TIMES DAILY Dx E11.9   Vitamin D, Ergocalciferol, (DRISDOL) 1.25 MG (50000 UNIT) CAPS capsule TAKE 1 CAPSULE EVERY 7 DAYS   No facility-administered encounter medications on file as of 03/03/2021.    Past Surgical History:  Procedure Laterality Date   ABDOMINAL HYSTERECTOMY     due to uterine cancer   BREAST SURGERY     left breast biopsy/benign   TUBAL LIGATION      Family History  Problem Relation Age of Onset   Diabetes Mother    Blindness Mother        related to diabetes   Stroke Mother    Heart disease Father        MI   Heart attack Father    Heart defect Father    Breast cancer Sister    Diabetes Brother        diet controlled   Colon cancer Neg Hx    Esophageal cancer Neg Hx    Rectal cancer Neg Hx    Stomach cancer Neg Hx       Controlled substance contract: 03/03/21     Review of Systems     Objective:   Physical Exam Vitals and nursing note reviewed.  Constitutional:      General: She is not in acute distress.    Appearance: Normal appearance. She is well-developed.  HENT:     Head: Normocephalic.     Right Ear: Tympanic membrane normal.     Left Ear: Tympanic membrane normal.     Nose: Nose normal.     Mouth/Throat:     Mouth: Mucous membranes are moist.  Eyes:     Pupils: Pupils are equal, round, and reactive to light.  Neck:     Vascular: No carotid bruit or JVD.  Cardiovascular:     Rate and Rhythm: Normal rate and regular rhythm.     Heart sounds: Normal heart sounds.   Pulmonary:     Effort: Pulmonary effort is normal. No respiratory distress.     Breath sounds: Normal breath sounds. No wheezing or rales.  Chest:     Chest wall: No tenderness.  Abdominal:     General: Abdomen is flat. Bowel sounds are normal. There is no distension or abdominal bruit.     Palpations: Abdomen is soft. There is mass (left upper quadrant). There is no hepatomegaly, splenomegaly or pulsatile mass.     Tenderness: There is no abdominal tenderness.  Musculoskeletal:        General: Normal range of motion.     Cervical back: Normal range of motion and neck supple.  Lymphadenopathy:     Cervical: No cervical adenopathy.  Skin:    General: Skin is warm and dry.  Neurological:     Mental Status: She is alert and oriented to person, place, and time.     Deep Tendon Reflexes: Reflexes are normal and symmetric.  Psychiatric:        Behavior: Behavior normal.        Thought Content: Thought content normal.        Judgment: Judgment normal.    BP 137/70    Pulse 93    Temp 98.4 F (36.9 C) (Temporal)    Resp 20    Ht $R'5\' 4"'aX$  (1.626 m)    Wt 206  lb (93.4 kg)    SpO2 96%    BMI 35.36 kg/m        Assessment & Plan:  KAMIRYN BEZANSON comes in today with chief complaint of Medical Management of Chronic Issues   Diagnosis and orders addressed:  1. Type 2 diabetes mellitus with diabetic polyneuropathy, with long-term current use of insulin (HCC) Strict CARB COUNTING NEED TO EAT 6 SMALL MEALS A DAY Says she can get hgba1c down on her own- doe snot want to see clinical pharmacist - Bayer DCA Hb A1c Waived - insulin glargine, 1 Unit Dial, (TOUJEO SOLOSTAR) 300 UNIT/ML Solostar Pen; Inject 85 Units into the skin in the morning and at bedtime.  Dispense: 12 mL; Refill: 5 - dapagliflozin propanediol (FARXIGA) 5 MG TABS tablet; Take 1 tablet (5 mg total) by mouth daily.  Dispense: 30 tablet; Refill: 3- insulin regular (NOVOLIN R) 100 units/mL injection; INJECT 22UNITS 4 TIMES A DAY PER  SLIDING SCALE  Dispense: 30 mL; Refill: 2    2. Essential hypertension, malignant Low sodium diet - CBC with Differential/Platelet - CMP14+EGFR - lisinopril (ZESTRIL) 20 MG tablet; Take 1 tablet (20 mg total) by mouth daily.  Dispense: 90 tablet; Refill: 1 - furosemide (LASIX) 20 MG tablet; TAKE 1 TABLET BY MOUTH EVERY DAY  Dispense: 90 tablet; Refill: 1  3. Mixed hyperlipidemia Low fat diet - Lipid panel - fenofibrate 160 MG tablet; Take 1 tablet (160 mg total) by mouth daily.  Dispense: 90 tablet; Refill: 1  4. Obstructive sleep apnea syndrome Need to get cpap machine back and start using it  5. GAD (generalized anxiety disorder) Stress management - LORazepam (ATIVAN) 1 MG tablet; Take 1 tablet (1 mg total) by mouth 3 (three) times daily.  Dispense: 90 tablet; Refill: 2  6. Episode of recurrent major depressive disorder, unspecified depression episode severity ([place]) Stress management - sertraline (ZOLOFT) 100 MG tablet; Take 2 tablets (200 mg total) by mouth daily.  Dispense: 180 tablet; Refill: 1  7. Low serum vitamin D Vitamin d supplement daily  8. Osteopenia of lumbar spine Weight bearing exercises  9. BMI 32.0-32.9,adult Discussed diet and exercise for person with BMI >25 Will recheck weight in 3-6 months   10. Abdominal mass, LUQ (left upper quadrant) - US Abdomen Complete; Future  11. Diabetic polyneuropathy associated with type 2 diabetes mellitus ([place]) - Ambulatory referral to Podiatry  12. Stress incontinence of urine - tolterodine (DETROL LA) 4 MG 24 hr capsule; Take 1 capsule (4 mg total) by mouth daily.  Dispense: 90 capsule; Refill: 1  13. Cough Continue mucinex  Labs pending Health Maintenance reviewed Diet and exercise encouraged  Follow up plan: 3 months   Mary-Margaret Hassell Done, FNP

## 2021-03-04 LAB — CMP14+EGFR
ALT: 9 IU/L (ref 0–32)
AST: 17 IU/L (ref 0–40)
Albumin/Globulin Ratio: 1.7 (ref 1.2–2.2)
Albumin: 4.2 g/dL (ref 3.7–4.7)
Alkaline Phosphatase: 75 IU/L (ref 44–121)
BUN/Creatinine Ratio: 23 (ref 12–28)
BUN: 31 mg/dL — ABNORMAL HIGH (ref 8–27)
Bilirubin Total: <0.2 mg/dL (ref 0.0–1.2)
CO2: 22 mmol/L (ref 20–29)
Calcium: 9.4 mg/dL (ref 8.7–10.3)
Chloride: 102 mmol/L (ref 96–106)
Creatinine, Ser: 1.33 mg/dL — ABNORMAL HIGH (ref 0.57–1.00)
Globulin, Total: 2.5 g/dL (ref 1.5–4.5)
Glucose: 205 mg/dL — ABNORMAL HIGH (ref 70–99)
Potassium: 4.9 mmol/L (ref 3.5–5.2)
Sodium: 139 mmol/L (ref 134–144)
Total Protein: 6.7 g/dL (ref 6.0–8.5)
eGFR: 42 mL/min/{1.73_m2} — ABNORMAL LOW (ref >59)

## 2021-03-04 LAB — CBC WITH DIFFERENTIAL/PLATELET
Basophils Absolute: 0.1 10*3/uL (ref 0.0–0.2)
Basos: 0 %
EOS (ABSOLUTE): 0.2 10*3/uL (ref 0.0–0.4)
Eos: 2 %
Hematocrit: 36.7 % (ref 34.0–46.6)
Hemoglobin: 11.4 g/dL (ref 11.1–15.9)
Immature Grans (Abs): 0 10*3/uL (ref 0.0–0.1)
Immature Granulocytes: 0 %
Lymphocytes Absolute: 2.1 10*3/uL (ref 0.7–3.1)
Lymphs: 17 %
MCH: 22.6 pg — ABNORMAL LOW (ref 26.6–33.0)
MCHC: 31.1 g/dL — ABNORMAL LOW (ref 31.5–35.7)
MCV: 73 fL — ABNORMAL LOW (ref 79–97)
Monocytes Absolute: 0.8 10*3/uL (ref 0.1–0.9)
Monocytes: 7 %
Neutrophils Absolute: 9.2 10*3/uL — ABNORMAL HIGH (ref 1.4–7.0)
Neutrophils: 74 %
Platelets: 379 10*3/uL (ref 150–450)
RBC: 5.04 x10E6/uL (ref 3.77–5.28)
RDW: 15.6 % — ABNORMAL HIGH (ref 11.7–15.4)
WBC: 12.4 10*3/uL — ABNORMAL HIGH (ref 3.4–10.8)

## 2021-03-04 LAB — LIPID PANEL
Chol/HDL Ratio: 4.5 ratio — ABNORMAL HIGH (ref 0.0–4.4)
Cholesterol, Total: 140 mg/dL (ref 100–199)
HDL: 31 mg/dL — ABNORMAL LOW (ref >39)
LDL Chol Calc (NIH): 69 mg/dL (ref 0–99)
Triglycerides: 243 mg/dL — ABNORMAL HIGH (ref 0–149)
VLDL Cholesterol Cal: 40 mg/dL (ref 5–40)

## 2021-03-06 ENCOUNTER — Ambulatory Visit (INDEPENDENT_AMBULATORY_CARE_PROVIDER_SITE_OTHER): Payer: 59

## 2021-03-06 ENCOUNTER — Telehealth: Payer: Self-pay

## 2021-03-06 ENCOUNTER — Other Ambulatory Visit: Payer: Self-pay | Admitting: Nurse Practitioner

## 2021-03-06 VITALS — Ht 64.0 in | Wt 206.0 lb

## 2021-03-06 DIAGNOSIS — Z5941 Food insecurity: Secondary | ICD-10-CM | POA: Diagnosis not present

## 2021-03-06 DIAGNOSIS — Z0001 Encounter for general adult medical examination with abnormal findings: Secondary | ICD-10-CM

## 2021-03-06 DIAGNOSIS — F339 Major depressive disorder, recurrent, unspecified: Secondary | ICD-10-CM

## 2021-03-06 DIAGNOSIS — Z0000 Encounter for general adult medical examination without abnormal findings: Secondary | ICD-10-CM

## 2021-03-06 DIAGNOSIS — Z1231 Encounter for screening mammogram for malignant neoplasm of breast: Secondary | ICD-10-CM

## 2021-03-06 NOTE — Progress Notes (Signed)
Cbc is stable Kidney and liver function stable Cholesterol looks good Hgba1c discussed at appointment

## 2021-03-06 NOTE — Patient Instructions (Addendum)
Ms. Alexandra Hunt , Thank you for taking time to come for your Medicare Wellness Visit. I appreciate your ongoing commitment to your health goals. Please review the following plan we discussed and let me know if I can assist you in the future.   Screening recommendations/referrals: Colonoscopy: Done 03/14/2018 - Repeat in 5 years Mammogram: Appointment made at [facility] 03/30/21 @ 3:50 - Repeat annually  Bone Density: Done 05/06/2015 - Repeat every 2 years *past due - consider at next appointment Recommended yearly ophthalmology/optometry visit for glaucoma screening and checkup Recommended yearly dental visit for hygiene and checkup  Vaccinations: Influenza vaccine: Done 11/28/2020 - Repeat annually  Pneumococcal vaccine: Done 09/29/2012 & 12/20/2014 Tdap vaccine: Done 03/03/2012 - Repeat in 10 years Shingles vaccine: Due   Covid-19: Declined  Advanced directives: Advance directive discussed with you today. Even though you declined this today, please call our office should you change your mind, and we can give you the proper paperwork for you to fill out.   Conditions/risks identified: Aim for 30 minutes of exercise or brisk walking each day, drink 6-8 glasses of water and eat lots of fruits and vegetables. I have sent a referral for counseling - in the meantime, try to do things to make you feel better - walking, doing games and puzzles, deep breathing exercises, self care.  Next appointment: Follow up in one year for your annual wellness visit    Preventive Care 65 Years and Older, Female Preventive care refers to lifestyle choices and visits with your health care provider that can promote health and wellness. What does preventive care include? A yearly physical exam. This is also called an annual well check. Dental exams once or twice a year. Routine eye exams. Ask your health care provider how often you should have your eyes checked. Personal lifestyle choices, including: Daily care of your  teeth and gums. Regular physical activity. Eating a healthy diet. Avoiding tobacco and drug use. Limiting alcohol use. Practicing safe sex. Taking low-dose aspirin every day. Taking vitamin and mineral supplements as recommended by your health care provider. What happens during an annual well check? The services and screenings done by your health care provider during your annual well check will depend on your age, overall health, lifestyle risk factors, and family history of disease. Counseling  Your health care provider may ask you questions about your: Alcohol use. Tobacco use. Drug use. Emotional well-being. Home and relationship well-being. Sexual activity. Eating habits. History of falls. Memory and ability to understand (cognition). Work and work Statistician. Reproductive health. Screening  You may have the following tests or measurements: Height, weight, and BMI. Blood pressure. Lipid and cholesterol levels. These may be checked every 5 years, or more frequently if you are over 58 years old. Skin check. Lung cancer screening. You may have this screening every year starting at age 80 if you have a 30-pack-year history of smoking and currently smoke or have quit within the past 15 years. Fecal occult blood test (FOBT) of the stool. You may have this test every year starting at age 7. Flexible sigmoidoscopy or colonoscopy. You may have a sigmoidoscopy every 5 years or a colonoscopy every 10 years starting at age 14. Hepatitis C blood test. Hepatitis B blood test. Sexually transmitted disease (STD) testing. Diabetes screening. This is done by checking your blood sugar (glucose) after you have not eaten for a while (fasting). You may have this done every 1-3 years. Bone density scan. This is done to screen  for osteoporosis. You may have this done starting at age 19. Mammogram. This may be done every 1-2 years. Talk to your health care provider about how often you should have  regular mammograms. Talk with your health care provider about your test results, treatment options, and if necessary, the need for more tests. Vaccines  Your health care provider may recommend certain vaccines, such as: Influenza vaccine. This is recommended every year. Tetanus, diphtheria, and acellular pertussis (Tdap, Td) vaccine. You may need a Td booster every 10 years. Zoster vaccine. You may need this after age 45. Pneumococcal 13-valent conjugate (PCV13) vaccine. One dose is recommended after age 80. Pneumococcal polysaccharide (PPSV23) vaccine. One dose is recommended after age 37. Talk to your health care provider about which screenings and vaccines you need and how often you need them. This information is not intended to replace advice given to you by your health care provider. Make sure you discuss any questions you have with your health care provider. Document Released: 03/14/2015 Document Revised: 11/05/2015 Document Reviewed: 12/17/2014 Elsevier Interactive Patient Education  2017 [employer] Prevention in the Home Falls can cause injuries. They can happen to people of all ages. There are many things you can do to make your home safe and to help prevent falls. What can I do on the outside of my home? Regularly fix the edges of walkways and driveways and fix any cracks. Remove anything that might make you trip as you walk through a door, such as a raised step or threshold. Trim any bushes or trees on the path to your home. Use bright outdoor lighting. Clear any walking paths of anything that might make someone trip, such as rocks or tools. Regularly check to see if handrails are loose or broken. Make sure that both sides of any steps have handrails. Any raised decks and porches should have guardrails on the edges. Have any leaves, snow, or ice cleared regularly. Use sand or salt on walking paths during winter. Clean up any spills in your garage right away. This  includes oil or grease spills. What can I do in the bathroom? Use night lights. Install grab bars by the toilet and in the tub and shower. Do not use towel bars as grab bars. Use non-skid mats or decals in the tub or shower. If you need to sit down in the shower, use a plastic, non-slip stool. Keep the floor dry. Clean up any water that spills on the floor as soon as it happens. Remove soap buildup in the tub or shower regularly. Attach bath mats securely with double-sided non-slip rug tape. Do not have throw rugs and other things on the floor that can make you trip. What can I do in the bedroom? Use night lights. Make sure that you have a light by your bed that is easy to reach. Do not use any sheets or blankets that are too big for your bed. They should not hang down onto the floor. Have a firm chair that has side arms. You can use this for support while you get dressed. Do not have throw rugs and other things on the floor that can make you trip. What can I do in the kitchen? Clean up any spills right away. Avoid walking on wet floors. Keep items that you use a lot in easy-to-reach places. If you need to reach something above you, use a strong step stool that has a grab bar. Keep electrical cords out of the way. Do  not use floor polish or wax that makes floors slippery. If you must use wax, use non-skid floor wax. Do not have throw rugs and other things on the floor that can make you trip. What can I do with my stairs? Do not leave any items on the stairs. Make sure that there are handrails on both sides of the stairs and use them. Fix handrails that are broken or loose. Make sure that handrails are as long as the stairways. Check any carpeting to make sure that it is firmly attached to the stairs. Fix any carpet that is loose or worn. Avoid having throw rugs at the top or bottom of the stairs. If you do have throw rugs, attach them to the floor with carpet tape. Make sure that you  have a light switch at the top of the stairs and the bottom of the stairs. If you do not have them, ask someone to add them for you. What else can I do to help prevent falls? Wear shoes that: Do not have high heels. Have rubber bottoms. Are comfortable and fit you well. Are closed at the toe. Do not wear sandals. If you use a stepladder: Make sure that it is fully opened. Do not climb a closed stepladder. Make sure that both sides of the stepladder are locked into place. Ask someone to hold it for you, if possible. Clearly mark and make sure that you can see: Any grab bars or handrails. First and last steps. Where the edge of each step is. Use tools that help you move around (mobility aids) if they are needed. These include: Canes. Walkers. Scooters. Crutches. Turn on the lights when you go into a dark area. Replace any light bulbs as soon as they burn out. Set up your furniture so you have a clear path. Avoid moving your furniture around. If any of your floors are uneven, fix them. If there are any pets around you, be aware of where they are. Review your medicines with your doctor. Some medicines can make you feel dizzy. This can increase your chance of falling. Ask your doctor what other things that you can do to help prevent falls. This information is not intended to replace advice given to you by your health care provider. Make sure you discuss any questions you have with your health care provider. Document Released: 12/12/2008 Document Revised: 07/24/2015 Document Reviewed: 03/22/2014 Elsevier Interactive Patient Education  2017 [employer].

## 2021-03-06 NOTE — Progress Notes (Signed)
Opened in wrong context  

## 2021-03-06 NOTE — Chronic Care Management (AMB) (Signed)
°  Chronic Care Management   Outreach Note  03/06/2021 Name: GRAVIELA NODAL MRN: [id number] DOB: 06/17/1945  TRICHELLE LEHAN is a 76 y.o. year old female who is a primary care patient of Chevis Pretty, FNP. I reached out to [name] by phone today in response to a referral sent by Ms. Olivia Canter Brick's primary care provider.  An unsuccessful telephone outreach was attempted today. The patient was referred to the case management team for assistance with care management and care coordination.   Follow Up Plan: A HIPAA compliant phone message was left for the patient providing contact information and requesting a return call.  The care management team will reach out to the patient again over the next 5 days.  If patient returns call to provider office, please advise to call [facility] * at [phone]*  Noreene Larsson, [place], [place] [facility]  [place], [place] [address] Direct Dial: [phone] [email or web address]

## 2021-03-06 NOTE — Progress Notes (Signed)
Subjective:   Alexandra Hunt is a 76 y.o. female who presents for Medicare Annual (Subsequent) preventive examination.  Virtual Visit via Telephone Note  I connected with  Alexandra Hunt on 03/06/21 at  9:45 AM EST by telephone and verified that I am speaking with the correct person using two identifiers.  Location: Patient: Home Provider: [facility] Persons participating in the virtual visit: patient/Nurse Health Advisor   I discussed the limitations, risks, security and privacy concerns of performing an evaluation and management service by telephone and the availability of in person appointments. The patient expressed understanding and agreed to proceed.  Interactive audio and video telecommunications were attempted between this nurse and patient, however failed, due to patient having technical difficulties OR patient did not have access to video capability.  We continued and completed visit with audio only.  Some vital signs may be absent or patient reported.   Murlene Revell E Elbia Paro, LPN   Review of Systems    Cardiac Risk Factors include: advanced age (>91men, >60 women);sedentary lifestyle;obesity (BMI >30kg/m2);diabetes mellitus;dyslipidemia;hypertension;Other (see comment), Risk factor comments: OSA - CPAP is broken right now     Objective:    Today's Vitals   03/06/21 0950  Weight: 206 lb (93.4 kg)  Height: $Remove'5\' 4"'PUbkbcC$  (1.626 m)  PainSc: 6    Body mass index is 35.36 kg/m.  Advanced Directives 03/06/2021 03/05/2020 07/03/2018 03/14/2015 12/20/2014  Does Patient Have a Medical Advance Directive? No No No No No  Would patient like information on creating a medical advance directive? No - Patient declined No - Patient declined Yes (MAU/Ambulatory/Procedural Areas - Information given) Yes - Educational materials given Yes - Educational materials given    Current Medications (verified) Outpatient Encounter Medications as of 03/06/2021  Medication Sig   albuterol (VENTOLIN HFA) 108 (90 Base)  MCG/ACT inhaler Inhale 2 puffs into the lungs every 6 (six) hours as needed for wheezing or shortness of breath.   ALPHAGAN P 0.1 % SOLN Apply 1 drop to eye 2 (two) times daily.   aspirin 81 MG tablet Take 81 mg by mouth daily.   brompheniramine-pseudoephedrine-DM 30-2-10 MG/5ML syrup Take by mouth 4 (four) times daily as needed.   Continuous Blood Gluc Receiver (FREESTYLE LIBRE 2 READER) DEVI Use to test blood sugar 4-6 times daily as directed DX: E11.9   Continuous Blood Gluc Sensor (FREESTYLE LIBRE 2 SENSOR) MISC Use to test blood sugar 4-6 times daily as directed DX: E11.9   [employer] LUBRICANT EYE DROPS 0.6 % SOLN Apply 1 drop to eye 2 (two) times daily.   dapagliflozin propanediol (FARXIGA) 5 MG TABS tablet Take 1 tablet (5 mg total) by mouth daily.   diazepam (VALIUM) 10 MG tablet SMARTSIG:1 Tablet(s) By Mouth   diclofenac (VOLTAREN) 75 MG EC tablet TAKE 1 TABLET BY MOUTH TWICE A DAY AS NEEDED   dorzolamide (TRUSOPT) 2 % ophthalmic solution Place 2 drops into both eyes 2 (two) times daily.   fenofibrate 160 MG tablet Take 1 tablet (160 mg total) by mouth daily.   fluticasone (FLONASE) 50 MCG/ACT nasal spray Place 2 sprays into both nostrils daily.   furosemide (LASIX) 20 MG tablet TAKE 1 TABLET BY MOUTH EVERY DAY   gabapentin (NEURONTIN) 100 MG capsule Take 100 mg by mouth daily.   ibuprofen (ADVIL,MOTRIN) 800 MG tablet TAKE 1 TABLET BY MOUTH EVERY 8 HOURS AS NEEDED   insulin glargine, 1 Unit Dial, (TOUJEO SOLOSTAR) 300 UNIT/ML Solostar Pen Inject 85 Units into the skin in the morning  and at bedtime.   Insulin Pen Needle (B-D UF III MINI PEN NEEDLES) 31G X 5 MM MISC USE WITH SLIDING SCALE HUMALOG 4 TIMES DAILY DX: E11.9   insulin regular (NOVOLIN R) 100 units/mL injection INJECT 22UNITS 4 TIMES A DAY PER SLIDING SCALE   Insulin Syringe-Needle U-100 (TRUEPLUS INSULIN SYRINGE) 31G X 5/16" 1 ML MISC Use daily with Lantus & Humalog insulin sliding scales as instructed Dx E11.9   lisinopril (ZESTRIL)  20 MG tablet Take 1 tablet (20 mg total) by mouth daily.   LORazepam (ATIVAN) 1 MG tablet Take 1 tablet (1 mg total) by mouth 3 (three) times daily.   magic mouthwash SOLN Take 5 mLs by mouth 4 (four) times daily.   meclizine (ANTIVERT) 25 MG tablet Take 1 tablet (25 mg total) by mouth 3 (three) times daily as needed for dizziness.   meloxicam (MOBIC) 15 MG tablet Take 15 mg by mouth daily.   metFORMIN (GLUCOPHAGE) 500 MG tablet Take 1 tablet (500 mg total) by mouth 2 (two) times daily with a meal.   mupirocin cream (BACTROBAN) 2 % Apply 1 application topically 2 (two) times daily.   ondansetron (ZOFRAN) 4 MG tablet    sertraline (ZOLOFT) 100 MG tablet Take 2 tablets (200 mg total) by mouth daily.   silver sulfADIAZINE (SILVADENE) 1 % cream Apply 1 application topically 2 (two) times daily.   tolterodine (DETROL LA) 4 MG 24 hr capsule Take 1 capsule (4 mg total) by mouth daily.   traMADol (ULTRAM) 50 MG tablet Take 50 mg by mouth.   Vitamin D, Ergocalciferol, (DRISDOL) 1.25 MG (50000 UNIT) CAPS capsule TAKE 1 CAPSULE EVERY 7 DAYS   Alcohol Swabs (B-D SINGLE USE SWABS REGULAR) PADS TEST BS UP TO FOUR TIMES DAILY Dx E11.9 (Patient not taking: Reported on 03/06/2021)   Blood Glucose Calibration (TRUE METRIX LEVEL 2) Normal SOLN Use with glucose machine Dx E11.9 (Patient not taking: Reported on 03/06/2021)   Blood Glucose Monitoring Suppl (TRUE METRIX METER) w/Device KIT TEST BS UP TO FOUR TIMES DAILY Dx E11.9 (Patient not taking: Reported on 03/06/2021)   glucose blood (TRUE METRIX BLOOD GLUCOSE TEST) test strip TEST BS UP TO FOUR TIMES DAILY Dx E11.9 (Patient not taking: Reported on 03/06/2021)   Lancet Device MISC USE TO CHECK BLOOD SUGAR TWICE DAILY AND AS NEEDED  E11.9 (Patient not taking: Reported on 03/06/2021)   TRUEplus Lancets 30G MISC TEST BS UP TO FOUR TIMES DAILY Dx E11.9 (Patient not taking: Reported on 03/06/2021)   No facility-administered encounter medications on file as of 03/06/2021.     Allergies (verified) Penicillins, Ivp dye [iodinated contrast media], Statins, Codeine, Latex, and Sulfa antibiotics   History: Past Medical History:  Diagnosis Date   Benign breast lumps    Cancer ([place]) 2007   uterine   Cervical radiculopathy    Cervical radiculopathy    Change in bowel habits    soft stools   Depression    Diabetes mellitus without complication (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[place])    pt not taking her meds  (pt states this is an inaccurate diagnosis and wants it removed) 03-15-2018   Skin cancer    on arms/right hand   Sleep apnea    Past Surgical History:  Procedure Laterality Date  ABDOMINAL HYSTERECTOMY     due to uterine cancer   BREAST SURGERY     left breast biopsy/benign   TUBAL LIGATION     Family History  Problem Relation Age of Onset   Diabetes Mother    Blindness Mother        related to diabetes   Stroke Mother    Heart disease Father        MI   Heart attack Father    Heart defect Father    Breast cancer Sister    Diabetes Brother        diet controlled   Colon cancer Neg Hx    Esophageal cancer Neg Hx    Rectal cancer Neg Hx    Stomach cancer Neg Hx    Social History   Socioeconomic History   Marital status: Widowed    Spouse name: Not on file   Number of children: 5   Years of education: 40   Highest education level: Associate degree: occupational, Hotel manager, or vocational program  Occupational History   Occupation: retired  Tobacco Use   Smoking status: Never   Smokeless tobacco: Never  Vaping Use   Vaping Use: Never used  Substance and Sexual Activity   Alcohol use: No   Drug use: No   Sexual activity: Not Currently    Birth control/protection: Post-menopausal  Other Topics Concern   Not on file  Social History Narrative   Son, Daughter, and  granddaughter live with her   Her daughter is going through drug addiction treatment   Lives in one level home with basement - she doesn't use stairs at all.   She has been very withdrawn and depressed since her husband passed.   Social Determinants of Health   Financial Resource Strain: Medium Risk   Difficulty of Paying Living Expenses: Somewhat hard  Food Insecurity: No Food Insecurity   Worried About Charity fundraiser in the Last Year: Never true   Ran Out of Food in the Last Year: Never true  Transportation Needs: No Transportation Needs   Lack of Transportation (Medical): No   Lack of Transportation (Non-Medical): No  Physical Activity: Inactive   Days of Exercise per Week: 0 days   Minutes of Exercise per Session: 0 min  Stress: Stress Concern Present   Feeling of Stress : To some extent  Social Connections: Moderately Isolated   Frequency of Communication with Friends and Family: Twice a week   Frequency of Social Gatherings with Friends and Family: Three times a week   Attends Religious Services: Never   Active Member of Clubs or Organizations: Yes   Attends Archivist Meetings: Never   Marital Status: Widowed    Tobacco Counseling Counseling given: Not Answered   Clinical Intake:  Pre-visit preparation completed: Yes  Pain : 0-10 Pain Score: 6  Pain Type: Chronic pain Pain Location: Generalized Pain Descriptors / Indicators: Aching, Discomfort, Sore Pain Onset: More than a month ago Pain Frequency: Intermittent     BMI - recorded: 35.36 Nutritional Status: BMI > 30  Obese Nutritional Risks: None Diabetes: No  How often do you need to have someone help you when you read instructions, pamphlets, or other written materials from your doctor or pharmacy?: 1 - Never  Diabetic? Nutrition Risk Assessment:  Has the patient had any N/V/D within the last 2 months?  No  Does the patient have any non-healing wounds?  Yes  - she has white spots that  pop up on shoulders, arms -  Has the patient had any unintentional weight loss or weight gain?  Yes  - over the last year or 2 - gained 50 lbs - likely from depression/ lack of activity  Diabetes:  Is the patient diabetic?  Yes  If diabetic, was a CBG obtained today?  No  Did the patient bring in their glucometer from home?  No  How often do you monitor your CBG's? CGM - several times per day.   Financial Strains and Diabetes Management:  Are you having any financial strains with the device, your supplies or your medication?  No - getting assistance from Health Dept .  Does the patient want to be seen by Chronic Care Management for management of their diabetes?  No  Would the patient like to be referred to a Nutritionist or for Diabetic Management?  No   Diabetic Exams:  Diabetic Eye Exam: Completed 04/14/2020.   Diabetic Foot Exam: Completed 11/28/2020. Pt has been advised about the importance in completing this exam. Pt is scheduled for diabetic foot exam on next year.    Interpreter Needed?: No  Information entered by :: Willistine Ferrall, LPN   Activities of Daily Living In your present state of health, do you have any difficulty performing the following activities: 03/06/2021  Hearing? N  Vision? N  Difficulty concentrating or making decisions? N  Walking or climbing stairs? Y  Dressing or bathing? N  Doing errands, shopping? Y  Comment will only drive very short distances  Conservation officer, nature and eating ? N  Using the Toilet? N  In the past six months, have you accidently leaked urine? Y  Comment wears depends  Do you have problems with loss of bowel control? N  Managing your Medications? N  Managing your Finances? Y  Comment her son handles most  Housekeeping or managing your Housekeeping? Y  Comment son cleans  Some recent data might be hidden    Patient Care Team: Chevis Pretty, FNP as PCP - General (Family Medicine) Maisie Fus, MD as Consulting Physician  (Obstetrics and Gynecology) Michael Boston, MD as Consulting Physician (General Surgery) Gatha Mayer, MD as Consulting Physician (Gastroenterology) Lavera Guise, [facility] (Pharmacist) Gaynelle Arabian, MD as Consulting Physician (Orthopedic Surgery) Monna Fam, MD as Consulting Physician (Ophthalmology)  Indicate any recent Medical Services you may have received from other than [facility] providers in the past year (date may be approximate).     Assessment:   This is a routine wellness examination for [place].  Hearing/Vision screen Hearing Screening - Comments:: Denies hearing difficulties Vision Screening - Comments:: Wears rx glasses - up to date with annual eye exams with [facility]  Dietary issues and exercise activities discussed: Current Exercise Habits: The patient does not participate in regular exercise at present, Exercise limited by: orthopedic condition(s);neurologic condition(s);respiratory conditions(s)   Goals Addressed               This Visit's Progress     Exercise 3x per week (30 min per time)   Not on track     Walking or riding stationary bicycle are great options.      Patient Stated (pt-stated)   Not on track     " I would like to get healthier"       Depression Screen PHQ 2/9 Scores 03/06/2021 03/03/2021 06/23/2020 03/05/2020 03/04/2020 07/31/2019 04/10/2019  PHQ - 2 Score $Remov'4 4 6 6 6 'pSmxUr$ 0 5  PHQ- 9 Score 10 8 13  13 14 0 9    Fall Risk Fall Risk  03/06/2021 03/03/2021 06/23/2020 03/05/2020 03/04/2020  Falls in the past year? 1 0 0 0 0  Number falls in past yr: 0 - - - -  Injury with Fall? 1 - - - -  Risk for fall due to : History of fall(s);Impaired balance/gait;Orthopedic patient;Medication side effect - - No Fall Risks -  Follow up Education provided;Falls prevention discussed - - Falls evaluation completed -    FALL RISK PREVENTION PERTAINING TO THE HOME:  Any stairs in or around the home? Yes  If so, are there any without handrails? No  Home free of loose throw rugs  in walkways, pet beds, electrical cords, etc? Yes  Adequate lighting in your home to reduce risk of falls? Yes   ASSISTIVE DEVICES UTILIZED TO PREVENT FALLS:  Life alert? No  Use of a cane, walker or w/c? Yes  Grab bars in the bathroom? No  Shower chair or bench in shower? Yes  Elevated toilet seat or a handicapped toilet? Yes   TIMED UP AND GO:  Was the test performed? No . Telephonic visit  Cognitive Function: Normal cognitive status assessed by direct observation by this Nurse Health Advisor. No abnormalities found.    MMSE - Mini Mental State Exam 03/14/2015  Orientation to time 5  Orientation to Place 5  Registration 3  Attention/ Calculation 5  Recall 3  Language- name 2 objects 2  Language- repeat 1  Language- follow 3 step command 3  Language- read & follow direction 1  Write a sentence 1  Copy design 1  Total score 30     6CIT Screen 03/05/2020 07/03/2018  What Year? 0 points 0 points  What month? 0 points 0 points  What time? 0 points 0 points  Count back from 20 0 points 0 points  Months in reverse 2 points 2 points  Repeat phrase 0 points 0 points  Total Score 2 2    Immunizations Immunization History  Administered Date(s) Administered   Fluad Quad(high Dose 65+) 03/04/2020, 11/28/2020   Influenza, High Dose Seasonal PF 12/27/2016, 01/27/2018   Influenza,inj,Quad PF,6+ Mos 02/07/2013, 12/20/2014, 12/02/2015   Influenza,inj,quad, With Preservative 12/30/2016   Influenza-Unspecified 05/30/2014   Pneumococcal Conjugate-13 12/20/2014   Pneumococcal Polysaccharide-23 09/29/2012   Tdap 03/03/2012    TDAP status: Up to date  Flu Vaccine status: Up to date  Pneumococcal vaccine status: Up to date  Covid-19 vaccine status: Declined, Education has been provided regarding the importance of this vaccine but patient still declined. Advised may receive this vaccine at local pharmacy or Health Dept.or vaccine clinic. Aware to provide a copy of the vaccination  record if obtained from local pharmacy or Health Dept. Verbalized acceptance and understanding.  Qualifies for Shingles Vaccine? Yes   Zostavax completed No   Shingrix Completed?: No.    Education has been provided regarding the importance of this vaccine. Patient has been advised to call insurance company to determine out of pocket expense if they have not yet received this vaccine. Advised may also receive vaccine at local pharmacy or Health Dept. Verbalized acceptance and understanding.  Screening Tests Health Maintenance  Topic Date Due   MAMMOGRAM  03/06/2020   COVID-19 Vaccine (1) 03/19/2021 (Originally 02/23/1946)   Zoster Vaccines- Shingrix (1 of 2) 06/01/2021 (Originally [birth year]/1966)   DEXA SCAN  03/03/2022 (Originally 05/05/2017)   LIPID PANEL  06/01/2021   HEMOGLOBIN A1C  06/01/2021   OPHTHALMOLOGY EXAM  02/16/2022  FOOT EXAM  03/03/2022   TETANUS/TDAP  03/03/2022   COLONOSCOPY (Pts 45-75yrs Insurance coverage will need to be confirmed)  03/15/2023   Pneumonia Vaccine 71+ Years old  Completed   INFLUENZA VACCINE  Completed   Hepatitis C Screening  Completed   HPV VACCINES  Aged Out    Health Maintenance  Health Maintenance Due  Topic Date Due   MAMMOGRAM  03/06/2020    Colorectal cancer screening: Type of screening: Colonoscopy. Completed 03/14/2018. Repeat every 5 years  Mammogram status: Ordered 11/2020. Pt provided with contact info and advised to call to schedule appt.  Made appt for 1/30 @ 3:50  Bone Density status: Completed 05/06/2015. Results reflect: Bone density results: OSTEOPENIA. Repeat every 2 years. May consider at next visit  Lung Cancer Screening: (Low Dose CT Chest recommended if Age 63-80 years, 30 pack-year currently smoking OR have quit w/in 15years.) does not qualify.   Additional Screening:  Hepatitis C Screening: does qualify; Completed 02/20/2015  Vision Screening: Recommended annual ophthalmology exams for early detection of glaucoma and  other disorders of the eye. Is the patient up to date with their annual eye exam?  Yes  Who is the provider or what is the name of the office in which the patient attends annual eye exams? [facility] If pt is not established with a provider, would they like to be referred to a provider to establish care? No .   Dental Screening: Recommended annual dental exams for proper oral hygiene  Community Resource Referral / Chronic Care Management: CRR required this visit?  Yes   CCM required this visit?  Yes      Plan:     I have personally reviewed and noted the following in the patients chart:   Medical and social history Use of alcohol, tobacco or illicit drugs  Current medications and supplements including opioid prescriptions.  Functional ability and status Nutritional status Physical activity Advanced directives List of other physicians Hospitalizations, surgeries, and ER visits in previous 12 months Vitals Screenings to include cognitive, depression, and falls Referrals and appointments  In addition, I have reviewed and discussed with patient certain preventive protocols, quality metrics, and best practice recommendations. A written personalized care plan for preventive services as well as general preventive health recommendations were provided to patient.     Sandrea Hammond, LPN   04/06/7122   Nurse Notes: CRR and CCM referral for counseling for depression and food assistance

## 2021-03-06 NOTE — Chronic Care Management (AMB) (Signed)
°  Chronic Care Management   Outreach Note  03/06/2021 Name: Alexandra Hunt MRN: [id number] DOB: 09-24-1945  Alexandra Hunt is a 76 y.o. year old female who is a primary care patient of Chevis Pretty, FNP. I reached out to [name] by phone today in response to a referral sent by Ms. [name]'s primary care provider.  An unsuccessful telephone outreach was attempted today. The patient was referred to the case management team for assistance with care management and care coordination.   Follow Up Plan: The care management team will reach out to the patient again over the next 3 days.  If patient returns call to provider office, please advise to call [facility] at [address], [address], [place], [place] [address] Direct Dial: [phone] [name] Website: [email or web address]

## 2021-03-10 NOTE — Chronic Care Management (AMB) (Signed)
Chronic Care Management   Note  03/10/2021 Name: Alexandra Hunt MRN: [id number] DOB: 06-19-1945  Alexandra Hunt is a 76 y.o. year old female who is a primary care patient of Chevis Pretty, FNP. I reached out to [name] by phone today in response to a referral sent by Ms. Olivia Canter Rhee's PCP.  [name] was given information about Chronic Care Management services today including:  CCM service includes personalized support from designated clinical staff supervised by her physician, including individualized plan of care and coordination with other care providers 24/7 contact phone numbers for assistance for urgent and routine care needs. Service will only be billed when office clinical staff spend 20 minutes or more in a month to coordinate care. Only one practitioner may furnish and bill the service in a calendar month. The patient may stop CCM services at any time (effective at the end of the month) by phone call to the office staff. The patient is responsible for co-pay (up to 20% after annual deductible is met) if co-pay is required by the individual health plan.   Patient agreed to services and verbal consent obtained.   Follow up plan: Telephone appointment with care management team member scheduled for:03/18/2021  Noreene Larsson, [place], [place],  [address] Direct Dial: [phone] [email or web address]: [email or web address]

## 2021-03-11 ENCOUNTER — Ambulatory Visit (HOSPITAL_COMMUNITY): Payer: Medicare HMO

## 2021-03-11 ENCOUNTER — Telehealth: Payer: Self-pay

## 2021-03-11 ENCOUNTER — Telehealth: Payer: Self-pay | Admitting: *Deleted

## 2021-03-11 NOTE — Telephone Encounter (Signed)
° °  Telephone encounter was:  Successful.  03/11/2021 Name: Alexandra Hunt MRN: [id number] DOB: 03-05-1945  Alexandra Hunt is a 76 y.o. year old female who is a primary care patient of Chevis Pretty, [place] . The community resource team was consulted for assistance with Food Insecurity and Financial strain  Care guide performed the following interventions: Patient provided with information about care guide support team and interviewed to confirm resource needs.Called patient and she states her husband unexpectedly passed away and she is experiencing financial strain and food insecurities due to two adult children and granddaughter lives with her and the patient is providing all housing food and utilities.   Follow Up Plan:  Care guide will follow up with patient by phone over the next day     [facility], Care Management  [phone] [address], [place], [place] [address] Phone: [phone] Email: [email or web address]

## 2021-03-12 ENCOUNTER — Telehealth: Payer: Self-pay | Admitting: Nurse Practitioner

## 2021-03-12 ENCOUNTER — Telehealth: Payer: Self-pay

## 2021-03-12 NOTE — Telephone Encounter (Signed)
° °  Telephone encounter was:  Successful.  03/12/2021 Name: Alexandra Hunt MRN: [id number] DOB: May 16, 1945  Alexandra Hunt is a 76 y.o. year old female who is a primary care patient of Chevis Pretty, [place] . The community resource team was consulted for assistance with Food insecurities and Financial Strain  Care guide performed the following interventions: Mailed a Warden/ranger, food bank, Internet, financial and utility assistance resouces as well as some telephone support groups/ counseling information.  Follow Up Plan:  No further follow up planned at this time. The patient has been provided with needed resources.    [facility], Care Management  [phone] [address], [place], [place] [address] Phone: [phone] Email: [email or web address]

## 2021-03-13 ENCOUNTER — Other Ambulatory Visit: Payer: Self-pay

## 2021-03-13 DIAGNOSIS — R928 Other abnormal and inconclusive findings on diagnostic imaging of breast: Secondary | ICD-10-CM

## 2021-03-16 ENCOUNTER — Ambulatory Visit (HOSPITAL_COMMUNITY): Payer: Medicare HMO

## 2021-03-17 ENCOUNTER — Other Ambulatory Visit: Payer: Self-pay

## 2021-03-17 ENCOUNTER — Ambulatory Visit
Admission: RE | Admit: 2021-03-17 | Discharge: 2021-03-17 | Disposition: A | Discharge status: Home / Self Care | Payer: Medicare HMO | Source: Ambulatory Visit | Attending: Nurse Practitioner | Admitting: Nurse Practitioner

## 2021-03-17 DIAGNOSIS — R1902 Left upper quadrant abdominal swelling, mass and lump: Secondary | ICD-10-CM | POA: Diagnosis not present

## 2021-03-17 DIAGNOSIS — K8020 Calculus of gallbladder without cholecystitis without obstruction: Secondary | ICD-10-CM | POA: Diagnosis not present

## 2021-03-18 ENCOUNTER — Ambulatory Visit (INDEPENDENT_AMBULATORY_CARE_PROVIDER_SITE_OTHER): Payer: Medicare HMO | Admitting: Licensed Clinical Social Worker

## 2021-03-18 DIAGNOSIS — F411 Generalized anxiety disorder: Secondary | ICD-10-CM

## 2021-03-18 DIAGNOSIS — F339 Major depressive disorder, recurrent, unspecified: Secondary | ICD-10-CM

## 2021-03-18 DIAGNOSIS — E1142 Type 2 diabetes mellitus with diabetic polyneuropathy: Secondary | ICD-10-CM

## 2021-03-18 DIAGNOSIS — I10 Essential (primary) hypertension: Secondary | ICD-10-CM

## 2021-03-18 DIAGNOSIS — G4730 Sleep apnea, unspecified: Secondary | ICD-10-CM

## 2021-03-18 DIAGNOSIS — E782 Mixed hyperlipidemia: Secondary | ICD-10-CM

## 2021-03-18 NOTE — Chronic Care Management (AMB) (Signed)
Chronic Care Management    Clinical Social Work Note  03/18/2021 Name: Alexandra Hunt MRN: [id number] DOB: 1945-12-05  Alexandra Hunt is a 77 y.o. year old female who is a primary care patient of Chevis Pretty, [place]. The CCM team was consulted to assist the patient with chronic disease management and/or care coordination needs related to: [employer] .   Engaged with patient by telephone for initial visit in response to provider referral for social work chronic care management and care coordination services.   Consent to Services:  The patient was given the following information about Chronic Care Management services today, agreed to services, and gave verbal consent: 1. CCM service includes personalized support from designated clinical staff supervised by the primary care provider, including individualized plan of care and coordination with other care providers 2. 24/7 contact phone numbers for assistance for urgent and routine care needs. 3. Service will only be billed when office clinical staff spend 20 minutes or more in a month to coordinate care. 4. Only one practitioner may furnish and bill the service in a calendar month. 5.The patient may stop CCM services at any time (effective at the end of the month) by phone call to the office staff. 6. The patient will be responsible for cost sharing (co-pay) of up to 20% of the service fee (after annual deductible is met). Patient agreed to services and consent obtained.  Patient agreed to services and consent obtained.   Assessment: Review of patient past medical history, allergies, medications, and health status, including review of relevant consultants reports was performed today as part of a comprehensive evaluation and provision of chronic care management and care coordination services.     SDOH (Social Determinants of Health) assessments and interventions performed:  SDOH Interventions    Flowsheet Row Most Recent Value  SDOH  Interventions   Physical Activity Interventions Other (Comments)  [client has walking challenges. she has a cane to use as needed. she has a walker to use as needed.]  Stress Interventions Provide Counseling  [client has stress related to managing medical needs. client has stress related to managing financial needs]  Depression Interventions/Treatment  Currently on Treatment, Counseling        Advanced Directives Status: See Vynca application for related entries.  CCM Care Plan  Allergies  Allergen Reactions   Penicillins     SOB,rash   Ivp Dye [Iodinated Contrast Media]     Burning at IV site   Statins     myalgias   Codeine     nauseated   Latex     rash   Sulfa Antibiotics     nauseated    Outpatient Encounter Medications as of 03/18/2021  Medication Sig Note   albuterol (VENTOLIN HFA) 108 (90 Base) MCG/ACT inhaler Inhale 2 puffs into the lungs every 6 (six) hours as needed for wheezing or shortness of breath.    Alcohol Swabs (B-D SINGLE USE SWABS REGULAR) PADS TEST BS UP TO FOUR TIMES DAILY Dx E11.9 (Patient not taking: Reported on 03/06/2021)    ALPHAGAN P 0.1 % SOLN Apply 1 drop to eye 2 (two) times daily.    aspirin 81 MG tablet Take 81 mg by mouth daily.    Blood Glucose Calibration (TRUE METRIX LEVEL 2) Normal SOLN Use with glucose machine Dx E11.9 (Patient not taking: Reported on 03/06/2021)    Blood Glucose Monitoring Suppl (TRUE METRIX METER) w/Device KIT TEST BS UP TO FOUR TIMES DAILY Dx E11.9 (Patient not  taking: Reported on 03/06/2021)    brompheniramine-pseudoephedrine-DM 30-2-10 MG/5ML syrup Take by mouth 4 (four) times daily as needed.    Continuous Blood Gluc Receiver (FREESTYLE LIBRE 2 READER) DEVI Use to test blood sugar 4-6 times daily as directed DX: E11.9    Continuous Blood Gluc Sensor (FREESTYLE LIBRE 2 SENSOR) MISC Use to test blood sugar 4-6 times daily as directed DX: E11.9 11/28/2020: Send libre rx to [place] [facility] per insurance   [facility]  LUBRICANT EYE DROPS 0.6 % SOLN Apply 1 drop to eye 2 (two) times daily.    dapagliflozin propanediol (FARXIGA) 5 MG TABS tablet Take 1 tablet (5 mg total) by mouth daily.    diazepam (VALIUM) 10 MG tablet SMARTSIG:1 Tablet(s) By Mouth    diclofenac (VOLTAREN) 75 MG EC tablet TAKE 1 TABLET BY MOUTH TWICE A DAY AS NEEDED    dorzolamide (TRUSOPT) 2 % ophthalmic solution Place 2 drops into both eyes 2 (two) times daily.    fenofibrate 160 MG tablet Take 1 tablet (160 mg total) by mouth daily.    fluticasone (FLONASE) 50 MCG/ACT nasal spray Place 2 sprays into both nostrils daily.    furosemide (LASIX) 20 MG tablet TAKE 1 TABLET BY MOUTH EVERY DAY    gabapentin (NEURONTIN) 100 MG capsule Take 100 mg by mouth daily.    glucose blood (TRUE METRIX BLOOD GLUCOSE TEST) test strip TEST BS UP TO FOUR TIMES DAILY Dx E11.9 (Patient not taking: Reported on 03/06/2021)    ibuprofen (ADVIL,MOTRIN) 800 MG tablet TAKE 1 TABLET BY MOUTH EVERY 8 HOURS AS NEEDED    insulin glargine, 1 Unit Dial, (TOUJEO SOLOSTAR) 300 UNIT/ML Solostar Pen Inject 85 Units into the skin in the morning and at bedtime.    Insulin Pen Needle (B-D UF III MINI PEN NEEDLES) 31G X 5 MM MISC USE WITH SLIDING SCALE HUMALOG 4 TIMES DAILY DX: E11.9    insulin regular (NOVOLIN R) 100 units/mL injection INJECT 22UNITS 4 TIMES A DAY PER SLIDING SCALE    Insulin Syringe-Needle U-100 (TRUEPLUS INSULIN SYRINGE) 31G X 5/16" 1 ML MISC Use daily with Lantus & Humalog insulin sliding scales as instructed Dx E11.9    Lancet Device MISC USE TO CHECK BLOOD SUGAR TWICE DAILY AND AS NEEDED  E11.9 (Patient not taking: Reported on 03/06/2021)    lisinopril (ZESTRIL) 20 MG tablet Take 1 tablet (20 mg total) by mouth daily.    LORazepam (ATIVAN) 1 MG tablet Take 1 tablet (1 mg total) by mouth 3 (three) times daily.    magic mouthwash SOLN Take 5 mLs by mouth 4 (four) times daily.    meclizine (ANTIVERT) 25 MG tablet Take 1 tablet (25 mg total) by mouth 3 (three) times  daily as needed for dizziness.    meloxicam (MOBIC) 15 MG tablet Take 15 mg by mouth daily.    metFORMIN (GLUCOPHAGE) 500 MG tablet Take 1 tablet (500 mg total) by mouth 2 (two) times daily with a meal.    mupirocin cream (BACTROBAN) 2 % Apply 1 application topically 2 (two) times daily.    ondansetron (ZOFRAN) 4 MG tablet  12/20/2014: Received from: External Pharmacy   sertraline (ZOLOFT) 100 MG tablet Take 2 tablets (200 mg total) by mouth daily.    silver sulfADIAZINE (SILVADENE) 1 % cream Apply 1 application topically 2 (two) times daily.    tolterodine (DETROL LA) 4 MG 24 hr capsule Take 1 capsule (4 mg total) by mouth daily.    traMADol Veatrice Bourbon)  50 MG tablet Take 50 mg by mouth. 03/06/2021: Taking once every 4-6hours prn   TRUEplus Lancets 30G MISC TEST BS UP TO FOUR TIMES DAILY Dx E11.9 (Patient not taking: Reported on 03/06/2021)    Vitamin D, Ergocalciferol, (DRISDOL) 1.25 MG (50000 UNIT) CAPS capsule TAKE 1 CAPSULE EVERY 7 DAYS    No facility-administered encounter medications on file as of 03/18/2021.    Patient Active Problem List   Diagnosis Date Noted   Stress incontinence of urine 03/03/2021   Neck pain 12/19/2020   Pain in right hand 12/01/2020   Uncontrolled type 2 diabetes mellitus with hyperglycemia ([place]) 11/28/2020   Bilateral carpal tunnel syndrome 11/24/2020   Acquired trigger finger of left middle finger 12/13/2019   Pain of left hand 12/13/2019   Drug-induced myopathy 09/04/2019   ETD (Eustachian tube dysfunction), bilateral 05/18/2019   Nasal turbinate hypertrophy 05/18/2019   Acute recurrent pansinusitis 04/27/2019   Deviated septum 04/27/2019   Recurrent acute suppurative otitis media without spontaneous rupture of tympanic membrane of both sides 04/27/2019   Rhinitis, chronic 04/27/2019   SOB (shortness of breath) 10/27/2018   Hx of adenomatous colonic polyps 03/20/2018   Osteopenia 07/09/2015   Low serum vitamin D 07/09/2015   BMI 32.0-32.9,adult 12/20/2014    Diabetic polyneuropathy associated with type 2 diabetes mellitus ([place]) 12/20/2014   Depression 10/11/2013   GAD (generalized anxiety disorder) 10/11/2013   CANCER, ENDOMETRIUM 05/25/2008   Hyperlipemia 05/25/2008   Essential hypertension, malignant 05/25/2008   Sleep apnea 05/25/2008    Conditions to be addressed/monitored: monitor client management of depression issues and anxiety issues.  Care Plan : Alexandra Hunt Care Plan  Updates made by Alexandra Cabal, Alexandra Hunt since 03/18/2021 12:00 AM     Problem: Coping Skills (General Plan of Care)      Goal: Coping Skills Enhanced. Manage Depression issues. Manage anxiety issues. Manage financial needs   Start Date: 03/18/2021  Expected End Date: 06/15/2021  This Visit's Progress: Not on track  Priority: Medium  Note:   Current barriers:   Patient in need of assistance with connecting to community resources for food support  Mobility issues Depression issues Financial issues.   Clinical Goals:  Patient to communicate with Alexandra Hunt in next 30 days to discuss depression management of client and to discuss anxiety management of client Patient to communicate with SW in next 30 days to discus financial challenges of client Client to communicate with RNCM in next 30 days to discuss nursing needs of client  Clinical Interventions:  Collaboration with Chevis Pretty, [place] regarding development and update of comprehensive plan of care as evidenced by provider attestation and co-signature Assessment of needs, barriers of client Completion of PHQ 2/9; completion of GAD-7 Discussed client needs with Alexandra Hunt Discussed sleeping challenges of client. Alexandra Hunt said she does not sleep very well Discussed food needs of client. She said that her son and daughter reside with her at her home. She said that a grandchild also lives with her at her home.  She said that her daughter receives food stamps benefit.  However, Alexandra Hunt spoke of food scarcity.Alexandra Hunt Kitchen  Alexandra Hunt  talked with Alexandra Hunt about [employer] pant[employer]  in [place], Alexandra Hunt.  Alexandra Hunt also stated to Alexandra Hunt that sometimes Alexandra Hunt may help with other expenses of the home (in the agency has available funds). Alexandra Hunt encouraged client to call Alexandra Hunt to discuss her current needs and resources through that agency Dicussed grief issues of client.  She said her spouse died 4 years ago. Alexandra Hunt talked with Alexandra Hunt about death of her spouse. Alexandra Hunt talked with Alexandra Hunt about Grief Share program through Alexandra Hunt, Alexandra Hunt. Alexandra Hunt encouraged client to call [facility], Alexandra Hunt and talk with representative about Grief Share Program. Discussed mood of client. She said she has been sad occasionally. She is concerned about medical needs, such as managing her Diabetes.  She is concerned about financial strain. Alexandra Hunt KitchenLCSW asked client if others in the home were helping with expenses each month. She said her daughter helps with cleaning and keeping home in order. She said her son helps pay for some things since he is working. She said her son takes her to her scheduled appointments. Alexandra Hunt talked with Kaleigha about relaxation techniques She spoke of enjoying time she spends with her pet dog. Encouraged Izella to communicate with RNCM for nursing needs Encouraged Shenia to communicate with Dr. Lottie Dawson, Pharmacist , for medication questions of client Provided counseling support for client Discussed energy level of client. She said she has reduced energy. She said she gets short of breath occasionally Discussed ambulation of client. She has a cane and a walker to use as needed to help her ambulate Discussed balance, dizziness issues. She spoke of being dizzy occasionally. She spoke of decreased vision.  She spoke of sometimes dropping items she was holding Encouraged client to call [facility] in [place], Alexandra Hunt to discuss equipment loan closet items available  Patient Coping  Skills:  Takes medications as prescribed Attends scheduled medical appointments  Patient Deficits:  Financial needs Depression issues; anxiety issues Mobility issues Food needs.  Patient Goals: In next 30 days, client will Attend scheduled medical appointments Communicate as needed with RNCM about nursing needs of client Communicate as needed with SW about depression issues of client and about food needs of client  Follow Up Plan: Alexandra Hunt to call client on 05/12/21 at 3:30 PM to assess client needs    Norva Riffle.Rosalia Mcavoy MSW, [place] [employer] representative [facility] Care Management [phone]

## 2021-03-18 NOTE — Patient Instructions (Addendum)
Visit Information  Patient goals:  Coping Skills Enhanced. Manage depression issues. Manage anxiety issues. Manage food needs of client  Time Frame:  Short Term Goal Priority:  Medium  Progress:  Not On Track  Start Date:  03/18/21 Expected End Date:  06/15/21  Follow Up Date:  05/12/21 at 3:30 PM  Coping Skills Enhanced. Manage depression issues. Manage anxiety issues. Manage food needs of client  Patient Coping Skills:  Takes medications as prescribed Attends scheduled medical appointments  Patient Deficits:  Financial needs Depression issues; anxiety issues Mobility issues Food needs.  Patient Goals: In next 30 days, client will Attend scheduled medical appointments Communicate as needed with RNCM about nursing needs of client Communicate as needed with SW about depression issues of client and about food needs of client  Follow Up Plan: LCSW to call client on 05/12/21 at 3:30 PM to assess client needs   Norva Riffle.Chrishawna Farina MSW, [place] [employer] representative [facility] Care Management [phone]

## 2021-03-19 ENCOUNTER — Other Ambulatory Visit: Payer: Self-pay | Admitting: Nurse Practitioner

## 2021-03-19 DIAGNOSIS — M79676 Pain in unspecified toe(s): Secondary | ICD-10-CM | POA: Diagnosis not present

## 2021-03-19 DIAGNOSIS — B351 Tinea unguium: Secondary | ICD-10-CM | POA: Diagnosis not present

## 2021-03-19 DIAGNOSIS — E1142 Type 2 diabetes mellitus with diabetic polyneuropathy: Secondary | ICD-10-CM | POA: Diagnosis not present

## 2021-03-23 DIAGNOSIS — M79642 Pain in left hand: Secondary | ICD-10-CM | POA: Diagnosis not present

## 2021-03-23 DIAGNOSIS — G5621 Lesion of ulnar nerve, right upper limb: Secondary | ICD-10-CM | POA: Diagnosis not present

## 2021-03-23 DIAGNOSIS — G5603 Carpal tunnel syndrome, bilateral upper limbs: Secondary | ICD-10-CM | POA: Diagnosis not present

## 2021-03-24 ENCOUNTER — Telehealth: Payer: Self-pay | Admitting: Nurse Practitioner

## 2021-03-24 DIAGNOSIS — N2889 Other specified disorders of kidney and ureter: Secondary | ICD-10-CM

## 2021-03-24 NOTE — Telephone Encounter (Signed)
MRI abdomenordered without contrast- allergic to contrast

## 2021-03-24 NOTE — Telephone Encounter (Signed)
Test has been ordered.

## 2021-03-24 NOTE — Telephone Encounter (Signed)
Pt aware test has been ordered.

## 2021-03-24 NOTE — Telephone Encounter (Signed)
Spoke to patient in regards to her mammogram - informed patient that order has been faxed to [facility] and they will call to make appt. I gave her the phone and advised her to call and they will schedule her appt.

## 2021-03-25 ENCOUNTER — Telehealth: Payer: Self-pay | Admitting: Nurse Practitioner

## 2021-03-25 DIAGNOSIS — I10 Essential (primary) hypertension: Secondary | ICD-10-CM

## 2021-03-25 MED ORDER — LISINOPRIL 20 MG PO TABS: 20.0000 mg | ORAL_TABLET | Freq: Every day | ORAL | 0 refills | Status: DC

## 2021-03-25 NOTE — Telephone Encounter (Signed)
Sent in a 30 day supply of her Lisinopril to [facility]. She will check on all her refills that were sent to [facility] mail order on 03/03/21, she has not received anything by mail yet.

## 2021-03-26 ENCOUNTER — Other Ambulatory Visit: Payer: Self-pay | Admitting: Nurse Practitioner

## 2021-03-26 DIAGNOSIS — N2889 Other specified disorders of kidney and ureter: Secondary | ICD-10-CM

## 2021-03-26 NOTE — Progress Notes (Signed)
Mri ordered as asked

## 2021-03-31 DIAGNOSIS — I10 Essential (primary) hypertension: Secondary | ICD-10-CM

## 2021-03-31 DIAGNOSIS — F339 Major depressive disorder, recurrent, unspecified: Secondary | ICD-10-CM

## 2021-03-31 DIAGNOSIS — E782 Mixed hyperlipidemia: Secondary | ICD-10-CM

## 2021-03-31 DIAGNOSIS — E1142 Type 2 diabetes mellitus with diabetic polyneuropathy: Secondary | ICD-10-CM

## 2021-03-31 DIAGNOSIS — Z794 Long term (current) use of insulin: Secondary | ICD-10-CM

## 2021-04-06 ENCOUNTER — Other Ambulatory Visit: Payer: Self-pay | Admitting: Nurse Practitioner

## 2021-04-06 DIAGNOSIS — N393 Stress incontinence (female) (male): Secondary | ICD-10-CM

## 2021-04-06 DIAGNOSIS — E782 Mixed hyperlipidemia: Secondary | ICD-10-CM

## 2021-04-06 DIAGNOSIS — F339 Major depressive disorder, recurrent, unspecified: Secondary | ICD-10-CM

## 2021-04-06 DIAGNOSIS — I10 Essential (primary) hypertension: Secondary | ICD-10-CM

## 2021-04-06 DIAGNOSIS — E1142 Type 2 diabetes mellitus with diabetic polyneuropathy: Secondary | ICD-10-CM

## 2021-04-06 DIAGNOSIS — E1165 Type 2 diabetes mellitus with hyperglycemia: Secondary | ICD-10-CM

## 2021-04-06 DIAGNOSIS — F411 Generalized anxiety disorder: Secondary | ICD-10-CM

## 2021-04-06 MED ORDER — LISINOPRIL 20 MG PO TABS: 20.0000 mg | ORAL_TABLET | Freq: Every day | ORAL | 0 refills | Status: DC

## 2021-04-06 MED ORDER — LORAZEPAM 1 MG PO TABS: 1.0000 mg | ORAL_TABLET | Freq: Three times a day (TID) | ORAL | 2 refills | Status: DC

## 2021-04-06 MED ORDER — FUROSEMIDE 20 MG PO TABS: ORAL_TABLET | ORAL | 1 refills | Status: DC

## 2021-04-06 MED ORDER — METFORMIN HCL 500 MG PO TABS: 500.0000 mg | ORAL_TABLET | Freq: Two times a day (BID) | ORAL | 1 refills | Status: DC

## 2021-04-06 MED ORDER — FENOFIBRATE 160 MG PO TABS: 160.0000 mg | ORAL_TABLET | Freq: Every day | ORAL | 1 refills | Status: DC

## 2021-04-06 MED ORDER — SERTRALINE HCL 100 MG PO TABS: 200.0000 mg | ORAL_TABLET | Freq: Every day | ORAL | 1 refills | Status: DC

## 2021-04-06 MED ORDER — TOLTERODINE TARTRATE ER 4 MG PO CP24: 4.0000 mg | ORAL_CAPSULE | Freq: Every day | ORAL | 1 refills | Status: DC

## 2021-04-09 ENCOUNTER — Ambulatory Visit (HOSPITAL_COMMUNITY): Payer: Medicare HMO

## 2021-04-09 MED ORDER — DAPAGLIFLOZIN PROPANEDIOL 5 MG PO TABS: 5.0000 mg | ORAL_TABLET | Freq: Every day | ORAL | 3 refills | Status: DC

## 2021-04-09 MED ORDER — INSULIN REGULAR HUMAN 100 UNIT/ML IJ SOLN: INTRAMUSCULAR | 2 refills | Status: AC

## 2021-04-09 MED ORDER — ALBUTEROL SULFATE HFA 108 (90 BASE) MCG/ACT IN AERS: 2.0000 | INHALATION_SPRAY | Freq: Four times a day (QID) | RESPIRATORY_TRACT | 2 refills | Status: DC | PRN

## 2021-04-09 MED ORDER — TOUJEO SOLOSTAR 300 UNIT/ML ~~LOC~~ SOPN: 85.0000 [IU] | PEN_INJECTOR | Freq: Two times a day (BID) | SUBCUTANEOUS | 5 refills | Status: DC

## 2021-04-17 ENCOUNTER — Telehealth: Payer: Self-pay

## 2021-04-17 ENCOUNTER — Telehealth: Payer: Self-pay | Admitting: Pharmacist

## 2021-04-17 NOTE — Progress Notes (Signed)
Opened in error

## 2021-04-17 NOTE — Telephone Encounter (Signed)
[employer] contacted me and stated they were unable to assist patient with med assistance going forward due to their grant limitations  If patient needs med assistance for remainder of the year she can make CCM appt with me.  Must bring in most recent household financial proof of income  Thanks!

## 2021-04-17 NOTE — Telephone Encounter (Signed)
Pt has been notified and states that she will bring income info to office. Scheduled for 05/27/2021

## 2021-04-19 ENCOUNTER — Other Ambulatory Visit: Payer: Self-pay

## 2021-04-19 ENCOUNTER — Ambulatory Visit
Admission: RE | Admit: 2021-04-19 | Discharge: 2021-04-19 | Disposition: A | Discharge status: Home / Self Care | Payer: Medicare HMO | Source: Ambulatory Visit | Attending: Nurse Practitioner | Admitting: Nurse Practitioner

## 2021-04-19 DIAGNOSIS — N2889 Other specified disorders of kidney and ureter: Secondary | ICD-10-CM

## 2021-04-19 MED ORDER — GADOBENATE DIMEGLUMINE 529 MG/ML IV SOLN
19.0000 mL | Freq: Once | INTRAVENOUS | Status: AC | PRN
  Administered 2021-04-19: 19 mL via INTRAVENOUS

## 2021-04-20 ENCOUNTER — Telehealth: Payer: Self-pay | Admitting: Nurse Practitioner

## 2021-04-20 NOTE — Telephone Encounter (Signed)
Pt aware that exam was ended, but there is still no final report from the MRI - aware MMM should respond as soon as she sees the final report.  Aware can also call back in the AM if no has not heard

## 2021-04-21 ENCOUNTER — Telehealth: Payer: Self-pay | Admitting: Nurse Practitioner

## 2021-04-21 NOTE — Telephone Encounter (Signed)
Please review

## 2021-04-23 ENCOUNTER — Telehealth: Payer: Self-pay | Admitting: Nurse Practitioner

## 2021-04-23 DIAGNOSIS — N6459 Other signs and symptoms in breast: Secondary | ICD-10-CM

## 2021-04-23 NOTE — Telephone Encounter (Signed)
Referral for diagnostic mammogram ordered.

## 2021-04-23 NOTE — Telephone Encounter (Signed)
Patient aware and verbalized understanding. °

## 2021-04-24 NOTE — Telephone Encounter (Signed)
Appt made with [facility] for March 9th - patient aware

## 2021-05-04 DIAGNOSIS — G5603 Carpal tunnel syndrome, bilateral upper limbs: Secondary | ICD-10-CM | POA: Diagnosis not present

## 2021-05-04 DIAGNOSIS — G5621 Lesion of ulnar nerve, right upper limb: Secondary | ICD-10-CM | POA: Diagnosis not present

## 2021-05-05 ENCOUNTER — Other Ambulatory Visit: Payer: Self-pay | Admitting: Nurse Practitioner

## 2021-05-05 DIAGNOSIS — R928 Other abnormal and inconclusive findings on diagnostic imaging of breast: Secondary | ICD-10-CM

## 2021-05-05 DIAGNOSIS — N6489 Other specified disorders of breast: Secondary | ICD-10-CM

## 2021-05-07 ENCOUNTER — Ambulatory Visit
Admission: RE | Admit: 2021-05-07 | Discharge: 2021-05-07 | Disposition: A | Discharge status: Home / Self Care | Payer: Medicare HMO | Source: Ambulatory Visit | Attending: Nurse Practitioner | Admitting: Nurse Practitioner

## 2021-05-07 ENCOUNTER — Ambulatory Visit: Admission: RE | Admit: 2021-05-07 | Payer: Medicare HMO | Source: Ambulatory Visit

## 2021-05-07 DIAGNOSIS — R928 Other abnormal and inconclusive findings on diagnostic imaging of breast: Secondary | ICD-10-CM

## 2021-05-07 DIAGNOSIS — N6489 Other specified disorders of breast: Secondary | ICD-10-CM

## 2021-05-07 DIAGNOSIS — R922 Inconclusive mammogram: Secondary | ICD-10-CM | POA: Diagnosis not present

## 2021-05-08 ENCOUNTER — Ambulatory Visit (INDEPENDENT_AMBULATORY_CARE_PROVIDER_SITE_OTHER): Payer: Medicare HMO

## 2021-05-08 ENCOUNTER — Encounter: Payer: Self-pay | Admitting: Nurse Practitioner

## 2021-05-08 ENCOUNTER — Ambulatory Visit (INDEPENDENT_AMBULATORY_CARE_PROVIDER_SITE_OTHER): Payer: Medicare HMO | Admitting: Nurse Practitioner

## 2021-05-08 VITALS — BP 131/63 | HR 86 | Temp 97.8°F | Resp 20 | Ht 64.0 in | Wt 207.0 lb

## 2021-05-08 DIAGNOSIS — E1142 Type 2 diabetes mellitus with diabetic polyneuropathy: Secondary | ICD-10-CM | POA: Diagnosis not present

## 2021-05-08 DIAGNOSIS — M25562 Pain in left knee: Secondary | ICD-10-CM | POA: Diagnosis not present

## 2021-05-08 DIAGNOSIS — F411 Generalized anxiety disorder: Secondary | ICD-10-CM | POA: Diagnosis not present

## 2021-05-08 DIAGNOSIS — E782 Mixed hyperlipidemia: Secondary | ICD-10-CM | POA: Diagnosis not present

## 2021-05-08 DIAGNOSIS — Z6832 Body mass index (BMI) 32.0-32.9, adult: Secondary | ICD-10-CM

## 2021-05-08 DIAGNOSIS — M25561 Pain in right knee: Secondary | ICD-10-CM

## 2021-05-08 DIAGNOSIS — E1165 Type 2 diabetes mellitus with hyperglycemia: Secondary | ICD-10-CM | POA: Diagnosis not present

## 2021-05-08 DIAGNOSIS — M8588 Other specified disorders of bone density and structure, other site: Secondary | ICD-10-CM

## 2021-05-08 DIAGNOSIS — F339 Major depressive disorder, recurrent, unspecified: Secondary | ICD-10-CM

## 2021-05-08 DIAGNOSIS — I10 Essential (primary) hypertension: Secondary | ICD-10-CM

## 2021-05-08 DIAGNOSIS — G4733 Obstructive sleep apnea (adult) (pediatric): Secondary | ICD-10-CM | POA: Diagnosis not present

## 2021-05-08 DIAGNOSIS — N393 Stress incontinence (female) (male): Secondary | ICD-10-CM

## 2021-05-08 DIAGNOSIS — Z794 Long term (current) use of insulin: Secondary | ICD-10-CM

## 2021-05-08 LAB — CBC WITH DIFFERENTIAL/PLATELET
Basophils Absolute: 0.1 10*3/uL (ref 0.0–0.2)
Basos: 1 %
EOS (ABSOLUTE): 0.2 10*3/uL (ref 0.0–0.4)
Eos: 2 %
Hematocrit: 39.5 % (ref 34.0–46.6)
Hemoglobin: 11.7 g/dL (ref 11.1–15.9)
Immature Grans (Abs): 0 10*3/uL (ref 0.0–0.1)
Immature Granulocytes: 0 %
Lymphocytes Absolute: 3.8 10*3/uL — ABNORMAL HIGH (ref 0.7–3.1)
Lymphs: 30 %
MCH: 22.2 pg — ABNORMAL LOW (ref 26.6–33.0)
MCHC: 29.6 g/dL — ABNORMAL LOW (ref 31.5–35.7)
MCV: 75 fL — ABNORMAL LOW (ref 79–97)
Monocytes Absolute: 0.8 10*3/uL (ref 0.1–0.9)
Monocytes: 6 %
Neutrophils Absolute: 7.5 10*3/uL — ABNORMAL HIGH (ref 1.4–7.0)
Neutrophils: 61 %
Platelets: 375 10*3/uL (ref 150–450)
RBC: 5.27 x10E6/uL (ref 3.77–5.28)
RDW: 15.4 % (ref 11.7–15.4)
WBC: 12.4 10*3/uL — ABNORMAL HIGH (ref 3.4–10.8)

## 2021-05-08 LAB — LIPID PANEL
Chol/HDL Ratio: 5 ratio — ABNORMAL HIGH (ref 0.0–4.4)
Cholesterol, Total: 140 mg/dL (ref 100–199)
HDL: 28 mg/dL — ABNORMAL LOW (ref >39)
LDL Chol Calc (NIH): 75 mg/dL (ref 0–99)
Triglycerides: 223 mg/dL — ABNORMAL HIGH (ref 0–149)
VLDL Cholesterol Cal: 37 mg/dL (ref 5–40)

## 2021-05-08 LAB — CMP14+EGFR
ALT: 11 IU/L (ref 0–32)
AST: 17 IU/L (ref 0–40)
Albumin/Globulin Ratio: 1.5 (ref 1.2–2.2)
Albumin: 3.9 g/dL (ref 3.7–4.7)
Alkaline Phosphatase: 62 IU/L (ref 44–121)
BUN/Creatinine Ratio: 18 (ref 12–28)
BUN: 21 mg/dL (ref 8–27)
Bilirubin Total: <0.2 mg/dL (ref 0.0–1.2)
CO2: 22 mmol/L (ref 20–29)
Calcium: 8.8 mg/dL (ref 8.7–10.3)
Chloride: 102 mmol/L (ref 96–106)
Creatinine, Ser: 1.2 mg/dL — ABNORMAL HIGH (ref 0.57–1.00)
Globulin, Total: 2.6 g/dL (ref 1.5–4.5)
Glucose: 238 mg/dL — ABNORMAL HIGH (ref 70–99)
Potassium: 4.4 mmol/L (ref 3.5–5.2)
Sodium: 143 mmol/L (ref 134–144)
Total Protein: 6.5 g/dL (ref 6.0–8.5)
eGFR: 48 mL/min/{1.73_m2} — ABNORMAL LOW (ref >59)

## 2021-05-08 LAB — BAYER DCA HB A1C WAIVED: HB A1C (BAYER DCA - WAIVED): 8.2 % — ABNORMAL HIGH (ref 4.8–5.6)

## 2021-05-08 MED ORDER — TOUJEO SOLOSTAR 300 UNIT/ML ~~LOC~~ SOPN: 90.0000 [IU] | PEN_INJECTOR | Freq: Two times a day (BID) | SUBCUTANEOUS | 5 refills | Status: DC

## 2021-05-08 MED ORDER — FREESTYLE LIBRE 2 SENSOR MISC: 4 refills | Status: DC

## 2021-05-08 MED ORDER — LISINOPRIL 20 MG PO TABS: 20.0000 mg | ORAL_TABLET | Freq: Every day | ORAL | 0 refills | Status: DC

## 2021-05-08 MED ORDER — LORAZEPAM 1 MG PO TABS: 1.0000 mg | ORAL_TABLET | Freq: Three times a day (TID) | ORAL | 2 refills | Status: DC

## 2021-05-08 MED ORDER — DAPAGLIFLOZIN PROPANEDIOL 5 MG PO TABS: 5.0000 mg | ORAL_TABLET | Freq: Every day | ORAL | 3 refills | Status: DC

## 2021-05-08 MED ORDER — GABAPENTIN 100 MG PO CAPS: 100.0000 mg | ORAL_CAPSULE | Freq: Two times a day (BID) | ORAL | 1 refills | Status: DC

## 2021-05-08 MED ORDER — FUROSEMIDE 20 MG PO TABS: ORAL_TABLET | ORAL | 1 refills | Status: DC

## 2021-05-08 MED ORDER — METFORMIN HCL 500 MG PO TABS: 500.0000 mg | ORAL_TABLET | Freq: Two times a day (BID) | ORAL | 1 refills | Status: DC

## 2021-05-08 MED ORDER — TOLTERODINE TARTRATE ER 4 MG PO CP24: 4.0000 mg | ORAL_CAPSULE | Freq: Every day | ORAL | 1 refills | Status: DC

## 2021-05-08 MED ORDER — SERTRALINE HCL 100 MG PO TABS: 200.0000 mg | ORAL_TABLET | Freq: Every day | ORAL | 1 refills | Status: DC

## 2021-05-08 NOTE — Patient Instructions (Signed)
Acute Knee Pain, Adult °Many things can cause knee pain. Sometimes, knee pain is sudden (acute) and may be caused by damage, swelling, or irritation of the muscles and tissues that support your knee. °The pain often goes away on its own with time and rest. If the pain does not go away, tests may be done to find out what is causing the pain. °Follow these instructions at home: °If you have a knee sleeve or brace: ° °Wear the knee sleeve or brace as told by your doctor. Take it off only as told by your doctor. °Loosen it if your toes: °Tingle. °Become numb. °Turn cold and blue. °Keep it clean. °If the knee sleeve or brace is not waterproof: °Do not let it get wet. °Cover it with a watertight covering when you take a bath or shower. °Activity °Rest your knee. °Do not do things that cause pain or make pain worse. °Avoid activities where both feet leave the ground at the same time (high-impact activities). Examples are running, jumping rope, and doing jumping jacks. °Work with a physical therapist to make a safe exercise program, as told by your doctor. °Managing pain, stiffness, and swelling ° °If told, put ice on the knee. To do this: °If you have a removable knee sleeve or brace, take it off as told by your doctor. °Put ice in a plastic bag. °Place a towel between your skin and the bag. °Leave the ice on for 20 minutes, 2-3 times a day. °Take off the ice if your skin turns bright red. This is very important. If you cannot feel pain, heat, or cold, you have a greater risk of damage to the area. °If told, use an elastic bandage to put pressure (compression) on your injured knee. °Raise your knee above the level of your heart while you are sitting or lying down. °Sleep with a pillow under your knee. °General instructions °Take over-the-counter and prescription medicines only as told by your doctor. °Do not smoke or use any products that contain nicotine or tobacco. If you need help quitting, ask your doctor. °If you are  overweight, work with your doctor and a food expert (dietitian) to set goals to lose weight. Being overweight can make your knee hurt more. °Watch for any changes in your symptoms. °Keep all follow-up visits. °Contact a doctor if: °The knee pain does not stop. °The knee pain changes or gets worse. °You have a fever along with knee pain. °Your knee is red or feels warm when you touch it. °Your knee gives out or locks up. °Get help right away if: °Your knee swells, and the swelling gets worse. °You cannot move your knee. °You have very bad knee pain that does not get better with pain medicine. °Summary °Many things can cause knee pain. The pain often goes away on its own with time and rest. °Your doctor may do tests to find out the cause of the pain. °Watch for any changes in your symptoms. Relieve your pain with rest, medicines, light activity, and use of ice. °Get help right away if you cannot move your knee or your knee pain is very bad. °This information is not intended to replace advice given to you by your health care provider. Make sure you discuss any questions you have with your health care provider. °Document Revised: 08/01/2019 Document Reviewed: 08/01/2019 °Elsevier Patient Education © 2022 [employer] ° °

## 2021-05-08 NOTE — Progress Notes (Signed)
Subjective:    Patient ID: Alexandra Hunt, female    DOB: [birth year], 76 y.o.   MRN: [id number]   Chief Complaint: Medical Management of Chronic Issues    HPI:  Alexandra Hunt is a 76 y.o. who identifies as a female who was assigned female at birth.   Social history: Lives with: some of her children and grandchildren Work history: retired from [employer] in today for follow up of the following chronic medical issues:  1. Uncontrolled type 2 diabetes mellitus with hyperglycemia (HCC) Fasting blood sugars are running around 50-200. She does not watch diet. She has Elenor Legato and warns her if getting low. We increased her toujeo at last visit Lab Results  Component Value Date   HGBA1C 8.6 (H) 03/03/2021     2. Mixed hyperlipidemia Does not watch diet and does no exercise at all. Lab Results  Component Value Date   CHOL 140 03/03/2021   HDL 31 (L) 03/03/2021   LDLCALC 69 03/03/2021   TRIG 243 (H) 03/03/2021   CHOLHDL 4.5 (H) 03/03/2021  The 10-year ASCVD risk score (Arnett DK, et al., 2019) is: 31.1%    3. Essential hypertension, malignant No c/o chest pain, sob or headache. Does not check blood pressure at home. BP Readings from Last 3 Encounters:  05/08/21 131/63  03/03/21 137/70  11/28/20 (!) 109/52    4. Diabetic polyneuropathy associated with type 2 diabetes mellitus (HCC) Has burning and numbness in bil feet  5. Stress incontinence of urine Is on detrol LA which helps  6. Episode of recurrent major depressive disorder, unspecified depression episode severity ([place]) Is on zoloft. Has good days and bad days.  Depression screen [facility] 2/9 05/08/2021 03/18/2021 03/06/2021  Decreased Interest 2 2 2   Down, Depressed, Hopeless 2 2 2   PHQ - 2 Score 4 4 4   Altered sleeping 2 2 2   Tired, decreased energy 2 2 2   Change in appetite 0 0 0  Feeling bad or failure about yourself  1 1 1   Trouble concentrating 0 0 0  Moving slowly or fidgety/restless 1 1 1   Suicidal  thoughts 0 0 0  PHQ-9 Score 10 10 10   Difficult doing work/chores Somewhat difficult Somewhat difficult Somewhat difficult  Some recent data might be hidden     7. GAD (generalized anxiety disorder) Is on ativan 3x a day. She stays anxious. She hates her living situation and stays stressed. GAD 7 : Generalized Anxiety Score 05/08/2021 03/18/2021 03/03/2021 06/23/2020  Nervous, Anxious, on Edge 2 2 3 3   Control/stop worrying 2 2 3 3   Worry too much - different things 2 2 3 3   Trouble relaxing 1 1 1 1   Restless 1 1 0 0  Easily annoyed or irritable 0 0 0 0  Afraid - awful might happen 1 1 0 3  Total GAD 7 Score 9 9 10 13   Anxiety Difficulty Somewhat difficult Somewhat difficult Not difficult at all Not difficult at all      8. Obstructive sleep apnea syndrome Has a cpap waiting to be picked up but she has not gone and gotten it yet.  9. Osteopenia of lumbar spine Last dexascan was done in 2017. Needs to be repeated  10. BMI 32.0-32.9,adult No recent weight changes Wt Readings from Last 3 Encounters:  05/08/21 207 lb (93.9 kg)  03/06/21 206 lb (93.4 kg)  03/03/21 206 lb (93.4 kg)   BMI Readings from Last 3 Encounters:  05/08/21 35.53 kg/m  03/06/21 35.36 kg/m  03/03/21 35.36 kg/m      New complaints: Left knee pain- fail several minths ago and feels worse  Allergies  Allergen Reactions   Penicillins     SOB,rash   Ivp Dye [Iodinated Contrast Media]     Burning at IV site   Statins     myalgias   Codeine     nauseated   Latex     rash   Sulfa Antibiotics     nauseated   Outpatient Encounter Medications as of 05/08/2021  Medication Sig   albuterol (VENTOLIN HFA) 108 (90 Base) MCG/ACT inhaler Inhale 2 puffs into the lungs every 6 (six) hours as needed for wheezing or shortness of breath.   Alcohol Swabs (B-D SINGLE USE SWABS REGULAR) PADS TEST BS UP TO FOUR TIMES DAILY Dx E11.9   ALPHAGAN P 0.1 % SOLN Apply 1 drop to eye 2 (two) times daily.   aspirin 81 MG  tablet Take 81 mg by mouth daily.   Blood Glucose Calibration (TRUE METRIX LEVEL 2) Normal SOLN Use with glucose machine Dx E11.9   Blood Glucose Monitoring Suppl (TRUE METRIX METER) w/Device KIT TEST BS UP TO FOUR TIMES DAILY Dx E11.9   Continuous Blood Gluc Receiver (FREESTYLE LIBRE 2 READER) DEVI Use to test blood sugar 4-6 times daily as directed DX: E11.9   Continuous Blood Gluc Sensor (FREESTYLE LIBRE 2 SENSOR) MISC Use to test blood sugar 4-6 times daily as directed DX: E11.9   [employer] LUBRICANT EYE DROPS 0.6 % SOLN Apply 1 drop to eye 2 (two) times daily.   dapagliflozin propanediol (FARXIGA) 5 MG TABS tablet Take 1 tablet (5 mg total) by mouth daily.   diclofenac (VOLTAREN) 75 MG EC tablet TAKE 1 TABLET BY MOUTH TWICE A DAY AS NEEDED   dorzolamide (TRUSOPT) 2 % ophthalmic solution Place 2 drops into both eyes 2 (two) times daily.   fenofibrate 160 MG tablet Take 1 tablet (160 mg total) by mouth daily.   fluticasone (FLONASE) 50 MCG/ACT nasal spray USE 2 SPRAYS IN EACH NOSTRIL ONCE DAILY   furosemide (LASIX) 20 MG tablet TAKE 1 TABLET BY MOUTH EVERY DAY   gabapentin (NEURONTIN) 100 MG capsule Take 100 mg by mouth daily.   glucose blood (TRUE METRIX BLOOD GLUCOSE TEST) test strip TEST BS UP TO FOUR TIMES DAILY Dx E11.9   ibuprofen (ADVIL,MOTRIN) 800 MG tablet TAKE 1 TABLET BY MOUTH EVERY 8 HOURS AS NEEDED   insulin glargine, 1 Unit Dial, (TOUJEO SOLOSTAR) 300 UNIT/ML Solostar Pen Inject 85 Units into the skin in the morning and at bedtime.   Insulin Pen Needle (B-D UF III MINI PEN NEEDLES) 31G X 5 MM MISC USE WITH SLIDING SCALE HUMALOG 4 TIMES DAILY DX: E11.9   insulin regular (NOVOLIN R) 100 units/mL injection INJECT 22UNITS 4 TIMES A DAY PER SLIDING SCALE   Insulin Syringe-Needle U-100 (TRUEPLUS INSULIN SYRINGE) 31G X 5/16" 1 ML MISC Use daily with Lantus & Humalog insulin sliding scales as instructed Dx E11.9   Lancet Device MISC USE TO CHECK BLOOD SUGAR TWICE DAILY AND AS NEEDED  E11.9    lisinopril (ZESTRIL) 20 MG tablet Take 1 tablet (20 mg total) by mouth daily.   LORazepam (ATIVAN) 1 MG tablet Take 1 tablet (1 mg total) by mouth 3 (three) times daily.   meclizine (ANTIVERT) 25 MG tablet Take 1 tablet (25 mg total) by mouth 3 (three) times daily as needed for dizziness.   meloxicam (MOBIC) 15 MG  tablet Take 15 mg by mouth daily.   mupirocin cream (BACTROBAN) 2 % Apply 1 application topically 2 (two) times daily.   ondansetron (ZOFRAN) 4 MG tablet    sertraline (ZOLOFT) 100 MG tablet Take 2 tablets (200 mg total) by mouth daily.   silver sulfADIAZINE (SILVADENE) 1 % cream Apply 1 application topically 2 (two) times daily.   tolterodine (DETROL LA) 4 MG 24 hr capsule Take 1 capsule (4 mg total) by mouth daily.   traMADol (ULTRAM) 50 MG tablet Take 50 mg by mouth.   TRUEplus Lancets 30G MISC TEST BS UP TO FOUR TIMES DAILY Dx E11.9   Vitamin D, Ergocalciferol, (DRISDOL) 1.25 MG (50000 UNIT) CAPS capsule TAKE 1 CAPSULE EVERY 7 DAYS   [DISCONTINUED] brompheniramine-pseudoephedrine-DM 30-2-10 MG/5ML syrup Take by mouth 4 (four) times daily as needed.   [DISCONTINUED] diazepam (VALIUM) 10 MG tablet SMARTSIG:1 Tablet(s) By Mouth   magic mouthwash SOLN Take 5 mLs by mouth 4 (four) times daily. (Patient not taking: Reported on 05/08/2021)   metFORMIN (GLUCOPHAGE) 500 MG tablet Take 1 tablet (500 mg total) by mouth 2 (two) times daily with a meal. (Patient not taking: Reported on 05/08/2021)   No facility-administered encounter medications on file as of 05/08/2021.    Past Surgical History:  Procedure Laterality Date   ABDOMINAL HYSTERECTOMY     due to uterine cancer   BREAST SURGERY     left breast biopsy/benign   TUBAL LIGATION      Family History  Problem Relation Age of Onset   Diabetes Mother    Blindness Mother        related to diabetes   Stroke Mother    Heart disease Father        MI   Heart attack Father    Heart defect Father    Breast cancer Sister    Diabetes  Brother        diet controlled   Colon cancer Neg Hx    Esophageal cancer Neg Hx    Rectal cancer Neg Hx    Stomach cancer Neg Hx       Controlled substance contract: 03/10/21     Review of Systems  Constitutional:  Negative for diaphoresis.  Eyes:  Negative for pain.  Respiratory:  Negative for shortness of breath.   Cardiovascular:  Negative for chest pain, palpitations and leg swelling.  Gastrointestinal:  Negative for abdominal pain.  Endocrine: Negative for polydipsia.  Skin:  Negative for rash.  Neurological:  Negative for dizziness, weakness and headaches.  Hematological:  Does not bruise/bleed easily.  All other systems reviewed and are negative.     Objective:   Physical Exam Vitals and nursing note reviewed.  Constitutional:      General: She is not in acute distress.    Appearance: Normal appearance. She is well-developed.  HENT:     Head: Normocephalic.     Right Ear: Tympanic membrane normal.     Left Ear: Tympanic membrane normal.     Nose: Nose normal.     Mouth/Throat:     Mouth: Mucous membranes are moist.  Eyes:     Pupils: Pupils are equal, round, and reactive to light.  Neck:     Vascular: No carotid bruit or JVD.  Cardiovascular:     Rate and Rhythm: Normal rate and regular rhythm.     Heart sounds: Normal heart sounds.  Pulmonary:     Effort: Pulmonary effort is normal. No respiratory distress.  Breath sounds: Normal breath sounds. No wheezing or rales.  Chest:     Chest wall: No tenderness.  Abdominal:     General: Bowel sounds are normal. There is no distension or abdominal bruit.     Palpations: Abdomen is soft. There is no hepatomegaly, splenomegaly, mass or pulsatile mass.     Tenderness: There is no abdominal tenderness.  Musculoskeletal:        General: Normal range of motion.     Cervical back: Normal range of motion and neck supple.     Comments: FROM of left knee with crepitus on flexio an dextension Slight patella  tenderness  Lymphadenopathy:     Cervical: No cervical adenopathy.  Skin:    General: Skin is warm and dry.  Neurological:     Mental Status: She is alert and oriented to person, place, and time.     Deep Tendon Reflexes: Reflexes are normal and symmetric.  Psychiatric:        Behavior: Behavior normal.        Thought Content: Thought content normal.        Judgment: Judgment normal.    BP 131/63    Pulse 86    Temp 97.8 F (36.6 C) (Temporal)    Resp 20    Ht $R'5\' 4"'QW$  (1.626 m)    Wt 207 lb (93.9 kg)    SpO2 97%    BMI 35.53 kg/m   HGBA1c 8.2%  Left knee xray- mild osteoarthritis , no fracture-Preliminary reading by Ronnald Collum, FNP  [facility]     Assessment & Plan:  Alexandra Hunt comes in today with chief complaint of Medical Management of Chronic Issues   Diagnosis and orders addressed:  1. Uncontrolled type 2 diabetes mellitus with hyperglycemia (HCC) Increase toujoe to 90u in  morning - Bayer DCA Hb A1c Waived - CBC with Differential/Platelet - CMP14+EGFR - metFORMIN (GLUCOPHAGE) 500 MG tablet; Take 1 tablet (500 mg total) by mouth 2 (two) times daily with a meal.  Dispense: 180 tablet; Refill: 1  2. Mixed hyperlipidemia Low fat det - Lipid panel  3. Essential hypertension, malignant Low sodium diet - lisinopril (ZESTRIL) 20 MG tablet; Take 1 tablet (20 mg total) by mouth daily.  Dispense: 30 tablet; Refill: 0 - furosemide (LASIX) 20 MG tablet; TAKE 1 TABLET BY MOUTH EVERY DAY  Dispense: 90 tablet; Refill: 1  4. Diabetic polyneuropathy associated with type 2 diabetes mellitus ([place]) Keep follow up with podiatrist - gabapentin (NEURONTIN) 100 MG capsule; Take 1 capsule (100 mg total) by mouth 2 (two) times daily.  Dispense: 180 capsule; Refill: 1  5. Stress incontinence of urine - tolterodine (DETROL LA) 4 MG 24 hr capsule; Take 1 capsule (4 mg total) by mouth daily.  Dispense: 90 capsule; Refill: 1  6. Episode of recurrent major depressive disorder, unspecified  depression episode severity ([place]) Stress management - sertraline (ZOLOFT) 100 MG tablet; Take 2 tablets (200 mg total) by mouth daily.  Dispense: 180 tablet; Refill: 1  7. GAD (generalized anxiety disorder) - ToxASSURE Select 13 (MW), Urine - LORazepam (ATIVAN) 1 MG tablet; Take 1 tablet (1 mg total) by mouth 3 (three) times daily.  Dispense: 90 tablet; Refill: 2  8. Obstructive sleep apnea syndrome Please pick up cpap machine and start using  9. Osteopenia of lumbar spine Refuses dexascan  10. BMI 32.0-32.9,adult Discussed diet and exercise for person with BMI >25 Will recheck weight in 3-6 months   11. Acute pain of right  knee Ice Bid Patient will make own appointment with ortho - DG Knee 1-2 Views Left  12. Type 2 diabetes mellitus with diabetic polyneuropathy, with long-term current use of insulin (HCC) Increased toujeo to 90 u daily - insulin glargine, 1 Unit Dial, (TOUJEO SOLOSTAR) 300 UNIT/ML Solostar Pen; Inject 90 Units into the skin in the morning and at bedtime.  Dispense: 12 mL; Refill: 5 - dapagliflozin propanediol (FARXIGA) 5 MG TABS tablet; Take 1 tablet (5 mg total) by mouth daily.  Dispense: 30 tablet; Refill: 3 - Continuous Blood Gluc Sensor (FREESTYLE LIBRE 2 SENSOR) MISC; Use to test blood sugar 4-6 times daily as directed DX: E11.9  Dispense: 6 each; Refill: 4   Labs pending Health Maintenance reviewed Diet and exercise encouraged  Follow up plan: 3 months    Mary-Margaret Hassell Done, FNP

## 2021-05-11 DIAGNOSIS — E1165 Type 2 diabetes mellitus with hyperglycemia: Secondary | ICD-10-CM | POA: Diagnosis not present

## 2021-05-12 ENCOUNTER — Telehealth: Payer: Medicare HMO

## 2021-05-13 ENCOUNTER — Ambulatory Visit (INDEPENDENT_AMBULATORY_CARE_PROVIDER_SITE_OTHER): Payer: Medicare HMO | Admitting: Licensed Clinical Social Worker

## 2021-05-13 DIAGNOSIS — E1165 Type 2 diabetes mellitus with hyperglycemia: Secondary | ICD-10-CM

## 2021-05-13 DIAGNOSIS — E1142 Type 2 diabetes mellitus with diabetic polyneuropathy: Secondary | ICD-10-CM

## 2021-05-13 DIAGNOSIS — I10 Essential (primary) hypertension: Secondary | ICD-10-CM

## 2021-05-13 DIAGNOSIS — F411 Generalized anxiety disorder: Secondary | ICD-10-CM

## 2021-05-13 DIAGNOSIS — G4730 Sleep apnea, unspecified: Secondary | ICD-10-CM

## 2021-05-13 DIAGNOSIS — F339 Major depressive disorder, recurrent, unspecified: Secondary | ICD-10-CM

## 2021-05-13 NOTE — Chronic Care Management (AMB) (Signed)
?Chronic Care Management  ? ? Clinical Social Work Note ? ?05/13/2021 ?Name: Alexandra Hunt MRN: [id number] DOB: 08-Apr-1945 ? ?Alexandra Hunt is a 76 y.o. year old female who is a primary care patient of Chevis Pretty, [place]. The CCM team was consulted to assist the patient with chronic disease management and/or care coordination needs related to: [employer] .  ? ?Engaged with patient by telephone for follow up visit in response to provider referral for social work chronic care management and care coordination services.  ? ?Consent to Services:  ?The patient was given information about Chronic Care Management services, agreed to services, and gave verbal consent prior to initiation of services.  Please see initial visit note for detailed documentation.  ? ?Patient agreed to services and consent obtained.  ? ?Assessment: Review of patient past medical history, allergies, medications, and health status, including review of relevant consultants reports was performed today as part of a comprehensive evaluation and provision of chronic care management and care coordination services.    ? ?SDOH (Social Determinants of Health) assessments and interventions performed:  ?SDOH Interventions   ? ?Flowsheet Row Most Recent Value  ?SDOH Interventions   ?Physical Activity Interventions Other (Comments)  [walking challenges. she uses a cane to help her walk]  ?Stress Interventions Provide Counseling  [client has stress related to managing medical needs]  ?Depression Interventions/Treatment  Counseling, Medication  ? ?  ?  ? ?Advanced Directives Status: See Vynca application for related entries. ? ?CCM Care Plan ? ?Allergies  ?Allergen Reactions  ? Penicillins   ?  SOB,rash  ? Ivp Dye [Iodinated Contrast Media]   ?  Burning at IV site  ? Statins   ?  myalgias  ? Codeine   ?  nauseated  ? Latex   ?  rash  ? Sulfa Antibiotics   ?  nauseated  ? ? ?Outpatient Encounter Medications as of 05/13/2021  ?Medication Sig Note  ?  albuterol (VENTOLIN HFA) 108 (90 Base) MCG/ACT inhaler Inhale 2 puffs into the lungs every 6 (six) hours as needed for wheezing or shortness of breath.   ? Alcohol Swabs (B-D SINGLE USE SWABS REGULAR) PADS TEST BS UP TO FOUR TIMES DAILY Dx E11.9   ? ALPHAGAN P 0.1 % SOLN Apply 1 drop to eye 2 (two) times daily.   ? aspirin 81 MG tablet Take 81 mg by mouth daily.   ? Blood Glucose Calibration (TRUE METRIX LEVEL 2) Normal SOLN Use with glucose machine Dx E11.9   ? Blood Glucose Monitoring Suppl (TRUE METRIX METER) w/Device KIT TEST BS UP TO FOUR TIMES DAILY Dx E11.9   ? Continuous Blood Gluc Receiver (FREESTYLE LIBRE 2 READER) DEVI Use to test blood sugar 4-6 times daily as directed DX: E11.9   ? Continuous Blood Gluc Sensor (FREESTYLE LIBRE 2 SENSOR) MISC Use to test blood sugar 4-6 times daily as directed DX: E11.9   ? CVS LUBRICANT EYE DROPS 0.6 % SOLN Apply 1 drop to eye 2 (two) times daily.   ? dapagliflozin propanediol (FARXIGA) 5 MG TABS tablet Take 1 tablet (5 mg total) by mouth daily.   ? diclofenac (VOLTAREN) 75 MG EC tablet TAKE 1 TABLET BY MOUTH TWICE A DAY AS NEEDED   ? dorzolamide (TRUSOPT) 2 % ophthalmic solution Place 2 drops into both eyes 2 (two) times daily.   ? fenofibrate 160 MG tablet Take 1 tablet (160 mg total) by mouth daily.   ? fluticasone (FLONASE) 50 MCG/ACT nasal spray  USE 2 SPRAYS IN EACH NOSTRIL ONCE DAILY   ? furosemide (LASIX) 20 MG tablet TAKE 1 TABLET BY MOUTH EVERY DAY   ? gabapentin (NEURONTIN) 100 MG capsule Take 1 capsule (100 mg total) by mouth 2 (two) times daily.   ? glucose blood (TRUE METRIX BLOOD GLUCOSE TEST) test strip TEST BS UP TO FOUR TIMES DAILY Dx E11.9   ? ibuprofen (ADVIL,MOTRIN) 800 MG tablet TAKE 1 TABLET BY MOUTH EVERY 8 HOURS AS NEEDED   ? insulin glargine, 1 Unit Dial, (TOUJEO SOLOSTAR) 300 UNIT/ML Solostar Pen Inject 90 Units into the skin in the morning and at bedtime.   ? Insulin Pen Needle (B-D UF III MINI PEN NEEDLES) 31G X 5 MM MISC USE WITH SLIDING  SCALE HUMALOG 4 TIMES DAILY DX: E11.9   ? insulin regular (NOVOLIN R) 100 units/mL injection INJECT 22UNITS 4 TIMES A DAY PER SLIDING SCALE   ? Insulin Syringe-Needle U-100 (TRUEPLUS INSULIN SYRINGE) 31G X 5/16" 1 ML MISC Use daily with Lantus & Humalog insulin sliding scales as instructed Dx E11.9   ? Lancet Device MISC USE TO CHECK BLOOD SUGAR TWICE DAILY AND AS NEEDED  E11.9   ? lisinopril (ZESTRIL) 20 MG tablet Take 1 tablet (20 mg total) by mouth daily.   ? LORazepam (ATIVAN) 1 MG tablet Take 1 tablet (1 mg total) by mouth 3 (three) times daily.   ? magic mouthwash SOLN Take 5 mLs by mouth 4 (four) times daily. (Patient not taking: Reported on 05/08/2021)   ? meclizine (ANTIVERT) 25 MG tablet Take 1 tablet (25 mg total) by mouth 3 (three) times daily as needed for dizziness.   ? meloxicam (MOBIC) 15 MG tablet Take 15 mg by mouth daily.   ? metFORMIN (GLUCOPHAGE) 500 MG tablet Take 1 tablet (500 mg total) by mouth 2 (two) times daily with a meal.   ? mupirocin cream (BACTROBAN) 2 % Apply 1 application topically 2 (two) times daily.   ? ondansetron (ZOFRAN) 4 MG tablet  12/20/2014: Received from: External Pharmacy  ? sertraline (ZOLOFT) 100 MG tablet Take 2 tablets (200 mg total) by mouth daily.   ? silver sulfADIAZINE (SILVADENE) 1 % cream Apply 1 application topically 2 (two) times daily.   ? tolterodine (DETROL LA) 4 MG 24 hr capsule Take 1 capsule (4 mg total) by mouth daily.   ? traMADol (ULTRAM) 50 MG tablet Take 50 mg by mouth. 03/06/2021: Taking once every 4-6hours prn  ? TRUEplus Lancets 30G MISC TEST BS UP TO FOUR TIMES DAILY Dx E11.9   ? Vitamin D, Ergocalciferol, (DRISDOL) 1.25 MG (50000 UNIT) CAPS capsule TAKE 1 CAPSULE EVERY 7 DAYS   ? ?No facility-administered encounter medications on file as of 05/13/2021.  ? ? ?Patient Active Problem List  ? Diagnosis Date Noted  ? Stress incontinence of urine 03/03/2021  ? Uncontrolled type 2 diabetes mellitus with hyperglycemia ([place]) 11/28/2020  ? Bilateral carpal  tunnel syndrome 11/24/2020  ? Drug-induced myopathy 09/04/2019  ? Nasal turbinate hypertrophy 05/18/2019  ? Acute recurrent pansinusitis 04/27/2019  ? Deviated septum 04/27/2019  ? Rhinitis, chronic 04/27/2019  ? Hx of adenomatous colonic polyps 03/20/2018  ? Osteopenia 07/09/2015  ? Low serum vitamin D 07/09/2015  ? BMI 32.0-32.9,adult 12/20/2014  ? Diabetic polyneuropathy associated with type 2 diabetes mellitus ([place]) 12/20/2014  ? Depression 10/11/2013  ? GAD (generalized anxiety disorder) 10/11/2013  ? CANCER, ENDOMETRIUM 05/25/2008  ? Hyperlipemia 05/25/2008  ? Essential hypertension, malignant 05/25/2008  ? Sleep apnea 05/25/2008  ? ? ?  Conditions to be addressed/monitored: monitor client management of depression issues ? ?Care Plan : LCSW Care Plan  ?Updates made by Katha Cabal, LCSW since 05/13/2021 12:00 AM  ?  ? ?Problem: Coping Skills (General Plan of Care)   ?  ? ?Goal: Coping Skills Enhanced. Manage Depression issues. Manage anxiety issues. Manage financial needs   ?Start Date: 05/13/2021  ?Expected End Date: 08/12/2021  ?This Visit's Progress: Not on track  ?Recent Progress: Not on track  ?Priority: Medium  ?Note:   ?Current barriers:   ?Patient in need of assistance with connecting to community resources for food support  ?Mobility issues ?Depression issues ?Financial issues.  ? ?Clinical Goals:  ?Patient to communicate with LCSW in next 30 days to discuss depression management of client and to discuss anxiety management of client ?Patient to communicate with SW in next 30 days to discus financial challenges of client ?Client to communicate with RNCM in next 30 days to discuss nursing needs of client ? ?Clinical Interventions:  ?Collaboration with Chevis Pretty, FNP regarding development and update of comprehensive plan of care as evidenced by provider attestation and co-signature ?Assessment of needs, barriers of client ?Discussed client needs with [name] ?Discussed sleeping  challenges of client. Emerlyn said she does not sleep very well. She said she does have C-PAP machine but that air pressure setting does need adjustment ?Discussed food needs of client. She said that her son and dau

## 2021-05-13 NOTE — Patient Instructions (Addendum)
Visit Information ? ?Patient goals:  Coping Skills enhanced. Manage depression issues. Manage anxiety issus. Manage food needs of client ? ?Time Frame:  Short Term Goal ?Priority:  Medium  ?Progress:  Not On Track ? ?Start Date:  03/18/21 ?Expected End Date:  08/12/21   ? ?Follow Up Date:  07/06/21 at 3:00 PM ? ?Coping Skills Enhanced. Manage depression issues. Manage anxiety issues. Manage food needs of client ? ?Patient Coping Skills: ? ?Takes medications as prescribed ?Attends scheduled medical appointments ? ?Patient Deficits: ? ?Financial needs ?Depression issues; anxiety issues ?Mobility issues ?Food needs. ? ?Patient Goals: In next 30 days, client will ?Attend scheduled medical appointments ?Communicate as needed with RNCM about nursing needs of client ?Communicate as needed with SW about depression issues of client and about food needs of client ? ?Follow Up Plan: LCSW to call client on 07/06/21 at 3:00 PM  to assess client needs  ? ?Norva Riffle.Abbe Bula MSW, LCSW ?Licensed Clinical Social Worker ?[facility] ?[phone] ?

## 2021-05-15 ENCOUNTER — Emergency Department (HOSPITAL_COMMUNITY): Payer: Medicare HMO

## 2021-05-15 ENCOUNTER — Encounter (HOSPITAL_COMMUNITY): Payer: Self-pay | Admitting: *Deleted

## 2021-05-15 ENCOUNTER — Emergency Department (HOSPITAL_COMMUNITY)
Admission: EM | Admit: 2021-05-15 | Discharge: 2021-05-15 | Disposition: A | Discharge status: Home / Self Care | Payer: Medicare HMO | Attending: Emergency Medicine | Admitting: Emergency Medicine

## 2021-05-15 DIAGNOSIS — M25561 Pain in right knee: Secondary | ICD-10-CM | POA: Diagnosis not present

## 2021-05-15 DIAGNOSIS — Z91040 Latex allergy status: Secondary | ICD-10-CM | POA: Insufficient documentation

## 2021-05-15 DIAGNOSIS — W06XXXA Fall from bed, initial encounter: Secondary | ICD-10-CM | POA: Diagnosis not present

## 2021-05-15 DIAGNOSIS — Z794 Long term (current) use of insulin: Secondary | ICD-10-CM | POA: Insufficient documentation

## 2021-05-15 DIAGNOSIS — R519 Headache, unspecified: Secondary | ICD-10-CM | POA: Diagnosis not present

## 2021-05-15 DIAGNOSIS — W19XXXA Unspecified fall, initial encounter: Secondary | ICD-10-CM

## 2021-05-15 DIAGNOSIS — S2241XA Multiple fractures of ribs, right side, initial encounter for closed fracture: Secondary | ICD-10-CM | POA: Diagnosis not present

## 2021-05-15 DIAGNOSIS — S299XXA Unspecified injury of thorax, initial encounter: Secondary | ICD-10-CM | POA: Diagnosis present

## 2021-05-15 DIAGNOSIS — Z7982 Long term (current) use of aspirin: Secondary | ICD-10-CM | POA: Insufficient documentation

## 2021-05-15 DIAGNOSIS — S0990XA Unspecified injury of head, initial encounter: Secondary | ICD-10-CM | POA: Diagnosis not present

## 2021-05-15 DIAGNOSIS — M25511 Pain in right shoulder: Secondary | ICD-10-CM | POA: Insufficient documentation

## 2021-05-15 DIAGNOSIS — R0781 Pleurodynia: Secondary | ICD-10-CM | POA: Diagnosis not present

## 2021-05-15 DIAGNOSIS — S4991XA Unspecified injury of right shoulder and upper arm, initial encounter: Secondary | ICD-10-CM | POA: Diagnosis not present

## 2021-05-15 DIAGNOSIS — M19011 Primary osteoarthritis, right shoulder: Secondary | ICD-10-CM | POA: Diagnosis not present

## 2021-05-15 MED ORDER — ONDANSETRON 4 MG PO TBDP
4.0000 mg | ORAL_TABLET | Freq: Once | ORAL | Status: AC
Start: 2021-05-15 — End: 2021-05-15
  Administered 2021-05-15: 4 mg via ORAL
  Filled 2021-05-15: qty 1

## 2021-05-15 MED ORDER — LIDOCAINE 5 % EX PTCH
1.0000 | MEDICATED_PATCH | CUTANEOUS | 0 refills | Status: DC
Start: 2021-05-15 — End: 2022-06-08

## 2021-05-15 MED ORDER — LIDOCAINE 5 % EX PTCH
1.0000 | MEDICATED_PATCH | CUTANEOUS | Status: DC
  Administered 2021-05-15: 1 via TRANSDERMAL
  Filled 2021-05-15: qty 1

## 2021-05-15 MED ORDER — OXYCODONE-ACETAMINOPHEN 5-325 MG PO TABS: 1.0000 | ORAL_TABLET | Freq: Four times a day (QID) | ORAL | 0 refills | Status: DC | PRN

## 2021-05-15 MED ORDER — MORPHINE SULFATE (PF) 4 MG/ML IV SOLN
4.0000 mg | Freq: Once | INTRAVENOUS | Status: AC
  Administered 2021-05-15: 4 mg via INTRAMUSCULAR
  Filled 2021-05-15: qty 1

## 2021-05-15 NOTE — Discharge Instructions (Addendum)
You have 2 fractured but nondisplaced ribs as discussed.  You are being prescribed pain medication to help you with your symptoms as this injury heals.  Use incentive spirometer provided.  Take the pain medication prescribed as well, do not drive within 4 hours of taking this medication as it will make you drowsy.  Plan a recheck with your primary provider within the next week as I can only provide you with a limited number of days of pain medication.  Splint/use of pillow to protect your ribs for any coughing or sneezing.  Get rechecked immediately for any shortness of breath, development of fever or other symptoms suggesting a lung infection as pneumonia is a common complication of rib fractures.   ?

## 2021-05-15 NOTE — ED Provider Notes (Signed)
?[facility] ?Provider Note ? ? ?CSN: [id number] ?Arrival date & time: 05/15/21  1416 ? ?  ? ?History ? ?Chief Complaint  ?Patient presents with  ? Fall  ? ? ?Alexandra Hunt is a 76 y.o. female presenting for evaluation of injuries sustained in a fall 2 days ago.  She rolled out of bed in her sleep, striking her right chest and shoulder against her nightstand and reports persistent pain, especially in her right rib cage, but also reports right shoulder and right knee pain.  She denies sob, but has difficulty taking a deep breath secondary to pain.  She endorses striking her head during the fall as well.  She denies dizziness, n/v, vision changes, neck pain, increased drowsiness or confusion since the fall.  She is not on anticoagulants.  She has found no alleviators for her symptoms. ? ?The history is provided by the patient.  ? ?  ? ?Home Medications ?Prior to Admission medications   ?Medication Sig Start Date End Date Taking? Authorizing Provider  ?lidocaine (LIDODERM) 5 % Place 1 patch onto the skin daily. Remove & Discard patch within 12 hours or as directed by MD 05/15/21  Yes Idol, Almyra Free, PA-C  ?oxyCODONE-acetaminophen (PERCOCET/ROXICET) 5-325 MG tablet Take 1 tablet by mouth every 6 (six) hours as needed for severe pain. 05/15/21  Yes Idol, Almyra Free, PA-C  ?albuterol (VENTOLIN HFA) 108 (90 Base) MCG/ACT inhaler Inhale 2 puffs into the lungs every 6 (six) hours as needed for wheezing or shortness of breath. 04/09/21   Chevis Pretty, FNP  ?Alcohol Swabs (B-D SINGLE USE SWABS REGULAR) PADS TEST BS UP TO FOUR TIMES DAILY Dx E11.9 06/21/19   Chevis Pretty, FNP  ?ALPHAGAN P 0.1 % SOLN Apply 1 drop to eye 2 (two) times daily. 03/03/21   [provider]  ?aspirin 81 MG tablet Take 81 mg by mouth daily.    [provider]  ?Blood Glucose Calibration (TRUE METRIX LEVEL 2) Normal SOLN Use with glucose machine Dx E11.9 06/21/19   Chevis Pretty, FNP  ?Blood Glucose  Monitoring Suppl (TRUE METRIX METER) w/Device KIT TEST BS UP TO FOUR TIMES DAILY Dx E11.9 11/17/20   Chevis Pretty, FNP  ?Continuous Blood Gluc Receiver (FREESTYLE LIBRE 2 READER) DEVI Use to test blood sugar 4-6 times daily as directed DX: E11.9 09/07/19   Chevis Pretty, FNP  ?Continuous Blood Gluc Sensor (FREESTYLE LIBRE 2 SENSOR) MISC Use to test blood sugar 4-6 times daily as directed DX: E11.9 05/08/21   Chevis Pretty, FNP  ?[facility] LUBRICANT EYE DROPS 0.6 % SOLN Apply 1 drop to eye 2 (two) times daily. 02/27/21   [provider]  ?dapagliflozin propanediol (FARXIGA) 5 MG TABS tablet Take 1 tablet (5 mg total) by mouth daily. 05/08/21   Chevis Pretty, FNP  ?diclofenac (VOLTAREN) 75 MG EC tablet TAKE 1 TABLET BY MOUTH TWICE A DAY AS NEEDED 04/28/18   Hassell Done, Mary-Margaret, FNP  ?dorzolamide (TRUSOPT) 2 % ophthalmic solution Place 2 drops into both eyes 2 (two) times daily.    [provider]  ?fenofibrate 160 MG tablet Take 1 tablet (160 mg total) by mouth daily. 04/06/21   Chevis Pretty, FNP  ?fluticasone (FLONASE) 50 MCG/ACT nasal spray USE 2 SPRAYS IN EACH NOSTRIL ONCE DAILY 03/20/21   Chevis Pretty, FNP  ?furosemide (LASIX) 20 MG tablet TAKE 1 TABLET BY MOUTH EVERY DAY 05/08/21   Chevis Pretty, FNP  ?gabapentin (NEURONTIN) 100 MG capsule Take 1 capsule (100 mg total) by mouth 2 (  two) times daily. 05/08/21   Chevis Pretty, FNP  ?glucose blood (TRUE METRIX BLOOD GLUCOSE TEST) test strip TEST BS UP TO FOUR TIMES DAILY Dx E11.9 06/21/19   Chevis Pretty, FNP  ?ibuprofen (ADVIL,MOTRIN) 800 MG tablet TAKE 1 TABLET BY MOUTH EVERY 8 HOURS AS NEEDED 12/13/17   Chevis Pretty, FNP  ?insulin glargine, 1 Unit Dial, (TOUJEO SOLOSTAR) 300 UNIT/ML Solostar Pen Inject 90 Units into the skin in the morning and at bedtime. 05/08/21   Chevis Pretty, FNP  ?Insulin Pen Needle (B-D UF III MINI PEN NEEDLES) 31G X 5 MM MISC USE WITH SLIDING SCALE  HUMALOG 4 TIMES DAILY DX: E11.9 11/20/20   Chevis Pretty, FNP  ?insulin regular (NOVOLIN R) 100 units/mL injection INJECT 22UNITS 4 TIMES A DAY PER SLIDING SCALE 04/09/21   Chevis Pretty, FNP  ?Insulin Syringe-Needle U-100 (TRUEPLUS INSULIN SYRINGE) 31G X 5/16" 1 ML MISC Use daily with Lantus & Humalog insulin sliding scales as instructed Dx E11.9 11/28/20   Chevis Pretty, FNP  ?Lancet Device MISC USE TO CHECK BLOOD SUGAR TWICE DAILY AND AS NEEDED  E11.9 04/28/18   Chevis Pretty, FNP  ?lisinopril (ZESTRIL) 20 MG tablet Take 1 tablet (20 mg total) by mouth daily. 05/08/21   Chevis Pretty, FNP  ?LORazepam (ATIVAN) 1 MG tablet Take 1 tablet (1 mg total) by mouth 3 (three) times daily. 05/08/21   Chevis Pretty, FNP  ?magic mouthwash SOLN Take 5 mLs by mouth 4 (four) times daily. ?Patient not taking: Reported on 05/08/2021 05/05/18   Chevis Pretty, FNP  ?meclizine (ANTIVERT) 25 MG tablet Take 1 tablet (25 mg total) by mouth 3 (three) times daily as needed for dizziness. 07/31/19   Chevis Pretty, FNP  ?meloxicam (MOBIC) 15 MG tablet Take 15 mg by mouth daily. 12/19/20   [provider]  ?metFORMIN (GLUCOPHAGE) 500 MG tablet Take 1 tablet (500 mg total) by mouth 2 (two) times daily with a meal. 05/08/21   Chevis Pretty, FNP  ?mupirocin cream (BACTROBAN) 2 % Apply 1 application topically 2 (two) times daily. 03/26/16   Chevis Pretty, FNP  ?ondansetron (ZOFRAN) 4 MG tablet  09/30/14   [provider]  ?sertraline (ZOLOFT) 100 MG tablet Take 2 tablets (200 mg total) by mouth daily. 05/08/21   Chevis Pretty, FNP  ?silver sulfADIAZINE (SILVADENE) 1 % cream Apply 1 application topically 2 (two) times daily.    [provider]  ?tolterodine (DETROL LA) 4 MG 24 hr capsule Take 1 capsule (4 mg total) by mouth daily. 05/08/21   Chevis Pretty, FNP  ?traMADol Veatrice Bourbon) 50 MG tablet Take 50 mg by mouth. 02/18/21   [provider]  ?TRUEplus Lancets 30G MISC TEST BS UP TO FOUR TIMES DAILY Dx E11.9 06/21/19   Chevis Pretty, FNP  ?Vitamin D, Ergocalciferol, (DRISDOL) 1.25 MG (50000 UNIT) CAPS capsule TAKE 1 CAPSULE EVERY 7 DAYS 02/27/21   Chevis Pretty, FNP  ?   ? ?Allergies    ?Penicillins, Ivp dye [iodinated contrast media], Statins, Codeine, Latex, and Sulfa antibiotics   ? ?Review of Systems   ?Review of Systems  ?Constitutional: Negative.   ?HENT: Negative.  Negative for facial swelling.   ?Eyes:  Negative for visual disturbance.  ?Cardiovascular:  Positive for chest pain.  ?Gastrointestinal: Negative.  Negative for nausea and vomiting.  ?Musculoskeletal:  Positive for arthralgias. Negative for joint swelling, myalgias and neck pain.  ?Skin:  Negative for wound.  ?Neurological:  Negative for dizziness, seizures, weakness, numbness and headaches.  ?All other  systems reviewed and are negative. ? ?Physical Exam ?Updated Vital Signs ?BP (!) 134/58 (BP Location: Right Arm)   Pulse 81   Temp 98 ?F (36.7 ?C) (Oral)   Resp 16   Ht 5' 4" (1.626 m)   Wt 90.7 kg   SpO2 94%   BMI 34.33 kg/m?  ?Physical Exam ?Vitals and nursing note reviewed.  ?Constitutional:   ?   Appearance: She is well-developed.  ?HENT:  ?   Head: Normocephalic and atraumatic.  ?   Comments: Ttp right scalp without hematoma. ?Eyes:  ?   Extraocular Movements: Extraocular movements intact.  ?   Conjunctiva/sclera: Conjunctivae normal.  ?   Pupils: Pupils are equal, round, and reactive to light.  ?Cardiovascular:  ?   Rate and Rhythm: Normal rate and regular rhythm.  ?   Heart sounds: Normal heart sounds.  ?Pulmonary:  ?   Breath sounds: Normal breath sounds. No wheezing.  ?   Comments: Reduced effort, normal aeration, no rales. ?Chest:  ? ? ?   Comments: Ttp right lateral ribs, no palpable deformity or crepitus, subtle bruising noted. ?Abdominal:  ?   General: Bowel sounds are normal.  ?   Palpations: Abdomen is soft.  ?   Tenderness: There is  no abdominal tenderness.  ?Musculoskeletal:     ?   General: Normal range of motion.  ?   Right shoulder: Tenderness present. No deformity or effusion. Normal pulse.  ?   Cervical back: Normal range of motion. No tenderness.

## 2021-05-15 NOTE — ED Triage Notes (Signed)
[place] out of bed 2 nights ago. Pain in right rib area, right knee, right shoulder ?

## 2021-05-15 NOTE — ED Notes (Signed)
Patient transported to CT 

## 2021-05-18 ENCOUNTER — Encounter (HOSPITAL_COMMUNITY): Payer: Self-pay

## 2021-05-18 ENCOUNTER — Emergency Department (HOSPITAL_COMMUNITY)
Admission: EM | Admit: 2021-05-18 | Discharge: 2021-05-19 | Disposition: A | Discharge status: Home / Self Care | Payer: Medicare HMO | Attending: Emergency Medicine | Admitting: Emergency Medicine

## 2021-05-18 ENCOUNTER — Other Ambulatory Visit: Payer: Self-pay

## 2021-05-18 DIAGNOSIS — E119 Type 2 diabetes mellitus without complications: Secondary | ICD-10-CM | POA: Diagnosis not present

## 2021-05-18 DIAGNOSIS — R001 Bradycardia, unspecified: Secondary | ICD-10-CM | POA: Diagnosis not present

## 2021-05-18 DIAGNOSIS — Z8541 Personal history of malignant neoplasm of cervix uteri: Secondary | ICD-10-CM | POA: Diagnosis not present

## 2021-05-18 DIAGNOSIS — I10 Essential (primary) hypertension: Secondary | ICD-10-CM | POA: Insufficient documentation

## 2021-05-18 DIAGNOSIS — R0781 Pleurodynia: Secondary | ICD-10-CM | POA: Diagnosis not present

## 2021-05-18 DIAGNOSIS — R739 Hyperglycemia, unspecified: Secondary | ICD-10-CM | POA: Diagnosis not present

## 2021-05-18 DIAGNOSIS — R0902 Hypoxemia: Secondary | ICD-10-CM | POA: Diagnosis not present

## 2021-05-18 DIAGNOSIS — Z85828 Personal history of other malignant neoplasm of skin: Secondary | ICD-10-CM | POA: Diagnosis not present

## 2021-05-18 NOTE — ED Triage Notes (Signed)
BIB EMS with c/o rib pain. Discharged on the 17th with rib fx. Not taking pain meds at home.  ?

## 2021-05-19 ENCOUNTER — Emergency Department (HOSPITAL_COMMUNITY): Payer: Medicare HMO

## 2021-05-19 DIAGNOSIS — R0781 Pleurodynia: Secondary | ICD-10-CM | POA: Diagnosis not present

## 2021-05-19 MED ORDER — OXYCODONE-ACETAMINOPHEN 5-325 MG PO TABS
1.0000 | ORAL_TABLET | Freq: Once | ORAL | Status: AC
  Administered 2021-05-19: 1 via ORAL
  Filled 2021-05-19: qty 1

## 2021-05-19 MED ORDER — KETOROLAC TROMETHAMINE 15 MG/ML IJ SOLN
15.0000 mg | Freq: Once | INTRAMUSCULAR | Status: AC
  Administered 2021-05-19: 15 mg via INTRAVENOUS
  Filled 2021-05-19: qty 1

## 2021-05-19 MED ORDER — HYDROMORPHONE HCL 1 MG/ML IJ SOLN
1.0000 mg | Freq: Once | INTRAMUSCULAR | Status: AC
  Administered 2021-05-19: 1 mg via INTRAVENOUS
  Filled 2021-05-19: qty 1

## 2021-05-19 NOTE — Discharge Instructions (Signed)
You were evaluated in the Emergency Department and after careful evaluation, we did not find any emergent condition requiring admission or further testing in the hospital. ? ?Your exam/testing today is overall reassuring.  Symptoms seem to be due to continued pain from your broken ribs.  Continue your home medications. ? ?Please return to the Emergency Department if you experience any worsening of your condition.   Thank you for allowing [place] to be a part of your care. ?

## 2021-05-19 NOTE — ED Provider Notes (Signed)
?[place] DEPT ?[facility] Emergency Department ?Provider Note ?MRN:  [id number]  ?Arrival date & time: 05/19/21    ? ?Chief Complaint   ?Pain ?  ?History of Present Illness   ?Alexandra Hunt is a 76 y.o. year-old female with a history of diabetes presenting to the ED with chief complaint of pain. ? ?Patient having continued rib pain.  Sustained a fall and broke her ribs a few days ago.  Pain not getting better, does not seem to be responding to home oxycodone.  Denies fever, no cough, no shortness of breath, no other complaints. ? ?Review of Systems  ?A thorough review of systems was obtained and all systems are negative except as noted in the HPI and PMH.  ? ?Patient's Health History   ? ?Past Medical History:  ?Diagnosis Date  ? Benign breast lumps   ? Cancer [facility]) 2007  ? uterine  ? Cervical radiculopathy   ? Cervical radiculopathy   ? Change in bowel habits   ? soft stools  ? Depression   ? Diabetes mellitus without complication ([place])   ? type 2 diabetic  ? History of anal fissures   ? Hx of adenomatous colonic polyps 03/20/2018  ? Hx of gallstones   ? Hyperlipidemia   ? Hypertension   ? 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 pt not taking her meds  (pt states this is an inaccurate diagnosis and wants it removed) 03-15-2018  ? Skin cancer   ? on arms/right hand  ? Sleep apnea   ?  ?Past Surgical History:  ?Procedure Laterality Date  ? ABDOMINAL HYSTERECTOMY    ? due to uterine cancer  ? BREAST SURGERY    ? left breast biopsy/benign  ? TUBAL LIGATION    ?  ?Family History  ?Problem Relation Age of Onset  ? Diabetes Mother   ? Blindness Mother   ?     related to diabetes  ? Stroke Mother   ? Heart disease Father   ?     MI  ? Heart attack Father   ? Heart defect Father   ? Breast cancer Sister   ? Diabetes Brother   ?     diet controlled  ? Colon cancer Neg Hx   ? Esophageal cancer Neg Hx   ? Rectal cancer Neg Hx   ? Stomach cancer Neg Hx   ?  ?Social History  ? ?Socioeconomic History  ? Marital status:  Widowed  ?  Spouse name: Not on file  ? Number of children: 5  ? Years of education: 46  ? Highest education level: Associate degree: occupational, Hotel manager, or vocational program  ?Occupational History  ? Occupation: retired  ?Tobacco Use  ? Smoking status: Never  ? Smokeless tobacco: Never  ?Vaping Use  ? Vaping Use: Never used  ?Substance and Sexual Activity  ? Alcohol use: No  ? Drug use: No  ? Sexual activity: Not Currently  ?  Birth control/protection: Post-menopausal  ?Other Topics Concern  ? Not on file  ?Social History Narrative  ? Son, Daughter, and granddaughter live with her  ? Her daughter is going through drug addiction treatment  ? Lives in one level home with basement - she doesn't use stairs at all.  ? She has been very withdrawn and depressed since her husband passed.  ? ?Social Determinants of Health  ? ?Financial Resource Strain: High Risk  ? Difficulty of Paying Living Expenses: Hard  ?Food Insecurity: No Food Insecurity  ?  Worried About Charity fundraiser in the Last Year: Never true  ? Ran Out of Food in the Last Year: Never true  ?Transportation Needs: No Transportation Needs  ? Lack of Transportation (Medical): No  ? Lack of Transportation (Non-Medical): No  ?Physical Activity: Inactive  ? Days of Exercise per Week: 0 days  ? Minutes of Exercise per Session: 0 min  ?Stress: Stress Concern Present  ? Feeling of Stress : Rather much  ?Social Connections: Socially Isolated  ? Frequency of Communication with Friends and Family: Once a week  ? Frequency of Social Gatherings with Friends and Family: Never  ? Attends Religious Services: Never  ? Active Member of Clubs or Organizations: No  ? Attends Archivist Meetings: Never  ? Marital Status: Widowed  ?Intimate Partner Violence: Not At Risk  ? Fear of Current or Ex-Partner: No  ? Emotionally Abused: No  ? Physically Abused: No  ? Sexually Abused: No  ?  ? ?Physical Exam  ? ?Vitals:  ? 05/19/21 0020 05/19/21 0100  ?BP: (!) 162/50 (!)  146/53  ?Pulse: 88 83  ?Resp: 12 15  ?Temp:    ?SpO2: 94% 91%  ?  ?CONSTITUTIONAL: Chronically ill-appearing, in moderate distress due to pain ?NEURO/PSYCH:  Alert and oriented x 3, no focal deficits ?EYES:  eyes equal and reactive ?ENT/NECK:  no LAD, no JVD ?CARDIO: Regular rate, well-perfused, normal S1 and S2 ?PULM:  CTAB no wheezing or rhonchi ?GI/GU:  non-distended, non-tender ?MSK/SPINE:  No gross deformities, no edema ?SKIN:  no rash, atraumatic ? ? ?*Additional and/or pertinent findings included in MDM below ? ?Diagnostic and Interventional Summary  ? ? EKG Interpretation ? ?Date/Time:    ?Ventricular Rate:    ?PR Interval:    ?QRS Duration:   ?QT Interval:    ?QTC Calculation:   ?R Axis:     ?Text Interpretation:   ?  ? ?  ? ?Labs Reviewed - No data to display  ?DG Chest Port 1 View  ?Final Result  ?  ?  ?Medications  ?oxyCODONE-acetaminophen (PERCOCET/ROXICET) 5-325 MG per tablet 1 tablet (has no administration in time range)  ?HYDROmorphone (DILAUDID) injection 1 mg (1 mg Intravenous Given 05/19/21 0016)  ?ketorolac (TORADOL) 15 MG/ML injection 15 mg (15 mg Intravenous Given 05/19/21 0013)  ?  ? ?Procedures  /  Critical Care ?Procedures ? ?ED Course and Medical Decision Making  ?Initial Impression and Ddx ?Continued pain, recent rib fractures, reassuring vital signs, does appear uncomfortable.  Seems to have gotten behind on her pain management.  Will provide pain control, obtain repeat x-ray to exclude signs of opacification, pneumonia, pneumothorax. ? ?Past medical/surgical history that increases complexity of ED encounter: None ? ?Interpretation of Diagnostics ?I personally reviewed the Chest Xray and my interpretation is as follows: No obvious pneumothorax ?   ? ? ?Patient Reassessment and Ultimate Disposition/Management ?Patient feeling better, normal vital signs, appropriate for discharge. ? ?Patient management required discussion with the following services or consulting groups:  None ? ?Complexity  of Problems Addressed ?Acute illness or injury that poses threat of life of bodily function ? ?Additional Data Reviewed and Analyzed ?Further history obtained from: ?None ? ?Additional Factors Impacting ED Encounter Risk ?None ? ?Barth Kirks. Sedonia Small, MD ?[facility] Emergency Medicine ?[place] [email or web address] ? ?Final Clinical Impressions(s) / ED Diagnoses  ? ?  ICD-10-CM   ?1. Rib pain  R07.81   ?  ?  ?ED Discharge Orders   ? ?  None  ? ?  ?  ? ?Discharge Instructions Discussed with and Provided to Patient:  ? ? ? ?Discharge Instructions   ? ?  ?You were evaluated in the Emergency Department and after careful evaluation, we did not find any emergent condition requiring admission or further testing in the hospital. ? ?Your exam/testing today is overall reassuring.  Symptoms seem to be due to continued pain from your broken ribs.  Continue your home medications. ? ?Please return to the Emergency Department if you experience any worsening of your condition.   Thank you for allowing [place] to be a part of your care. ? ? ? ? ?  ?Maudie Flakes, MD ?05/19/21 0158 ? ?

## 2021-05-21 ENCOUNTER — Encounter: Payer: Self-pay | Admitting: Nurse Practitioner

## 2021-05-21 ENCOUNTER — Ambulatory Visit (INDEPENDENT_AMBULATORY_CARE_PROVIDER_SITE_OTHER): Payer: Medicare HMO | Admitting: Nurse Practitioner

## 2021-05-21 VITALS — BP 126/68 | HR 94 | Temp 98.2°F | Resp 20 | Ht 64.0 in | Wt 210.0 lb

## 2021-05-21 DIAGNOSIS — Z09 Encounter for follow-up examination after completed treatment for conditions other than malignant neoplasm: Secondary | ICD-10-CM | POA: Diagnosis not present

## 2021-05-21 DIAGNOSIS — S2241XA Multiple fractures of ribs, right side, initial encounter for closed fracture: Secondary | ICD-10-CM

## 2021-05-21 LAB — URINALYSIS
Bilirubin, UA: NEGATIVE
Ketones, UA: NEGATIVE
Leukocytes,UA: NEGATIVE
Nitrite, UA: NEGATIVE
Protein,UA: NEGATIVE
RBC, UA: NEGATIVE
Specific Gravity, UA: 1.01 (ref 1.005–1.030)
Urobilinogen, Ur: 0.2 mg/dL (ref 0.2–1.0)
pH, UA: 6 (ref 5.0–7.5)

## 2021-05-21 MED ORDER — OXYCODONE-ACETAMINOPHEN 10-325 MG PO TABS: 1.0000 | ORAL_TABLET | Freq: Three times a day (TID) | ORAL | 0 refills | Status: AC | PRN

## 2021-05-21 MED ORDER — KETOROLAC TROMETHAMINE 60 MG/2ML IM SOLN
60.0000 mg | Freq: Once | INTRAMUSCULAR | Status: AC
  Administered 2021-05-21: 60 mg via INTRAMUSCULAR

## 2021-05-21 NOTE — Progress Notes (Signed)
? ?  Subjective:  ? ? Patient ID: Alexandra Hunt, female    DOB: 09/24/1945, 76 y.o.   MRN: [id number] ? ? ?Chief Complaint: hospital follow up ? ?HPI ?23.Patient originally went to the ED on 05/15/21. Patient had rolled out of bed 2 days prior  and landed on her right side. Chest xray showed multiple rib fractures. She went back to the ED on 05/18/21 c/o rib pain, despite being given oxycodone at previous ED visit. She was discharged home with no further changes in care. She was given dilaudid in ED. She says she is still having pain in right rib area. She is taking percocet 5/325 and has been having to take 2 tablets rather then 1 at a time. Rates pain 9/10. ? ? ?Review of Systems  ?Constitutional:  Negative for diaphoresis.  ?Eyes:  Negative for pain.  ?Respiratory:  Negative for shortness of breath.   ?Cardiovascular:  Negative for chest pain, palpitations and leg swelling.  ?Gastrointestinal:  Negative for abdominal pain.  ?Endocrine: Negative for polydipsia.  ?Skin:  Negative for rash.  ?Neurological:  Negative for dizziness, weakness and headaches.  ?Hematological:  Does not bruise/bleed easily.  ?All other systems reviewed and are negative. ? ?   ?Objective:  ? Physical Exam ?Constitutional:   ?   Appearance: Normal appearance. She is obese.  ?Cardiovascular:  ?   Rate and Rhythm: Normal rate and regular rhythm.  ?   Heart sounds: Normal heart sounds.  ?Pulmonary:  ?   Effort: Pulmonary effort is normal.  ?   Breath sounds: Normal breath sounds.  ?Skin: ?   General: Skin is warm and dry.  ?Neurological:  ?   General: No focal deficit present.  ?   Mental Status: She is alert and oriented to person, place, and time.  ?Psychiatric:     ?   Mood and Affect: Mood normal.     ?   Behavior: Behavior normal.  ? ? ?BP 126/68   Pulse 94   Temp 98.2 ?F (36.8 ?C) (Temporal)   Resp 20   Ht '5\' 4"'$  (1.626 m)   Wt 210 lb (95.3 kg)   SpO2 94%   BMI 36.05 kg/m?  ? ? ? ?   ?Assessment & Plan:  ?Alexandra Hunt in today with  chief complaint of Hospitalization Follow-up ? ? ?1. Closed fracture of multiple ribs of right side, initial encounter ?Ice ?Cough and deep breathe while splinting area every 2 hours to prevent pneumonia ?Alternate pain meds with motrin every 4 hours ?- ketorolac (TORADOL) injection 60 mg ?- oxyCODONE-acetaminophen (PERCOCET) 10-325 MG tablet; Take 1 tablet by mouth every 8 (eight) hours as needed for up to 5 days for pain.  Dispense: 20 tablet; Refill: 0 ? ?2. Hospital discharge follow-up ?Hospital records reviewed ? ? ? ?The above assessment and management plan was discussed with the patient. The patient verbalized understanding of and has agreed to the management plan. Patient is aware to call the clinic if symptoms persist or worsen. Patient is aware when to return to the clinic for a follow-up visit. Patient educated on when it is appropriate to go to the emergency department.  ? ?Mary-Margaret Hassell Done, FNP ? ? ? ?

## 2021-05-21 NOTE — Patient Instructions (Signed)
Rib Fracture ?A rib fracture is a break or crack in one of the bones of the ribs. The ribs are long, curved bones that wrap around your chest and attach to your spine and your breastbone. The ribs protect your heart, lungs, and other organs in the chest. ?A broken or cracked rib is often painful but is not usually serious. Most rib fractures heal on their own over time. However, rib fractures can be more serious if multiple ribs are broken or if broken ribs move out of place and push against other structures or organs. ?What are the causes? ?This condition is caused by: ?Repetitive movements with high force, such as pitching a baseball or having very bad coughing spells. ?A direct hit the chest, such as a sports injury, a car crash, or a fall. ?Cancer that has spread to the bones, which can weaken bones and cause them to break. ?What are the signs or symptoms? ?Symptoms of this condition include: ?Pain when you breathe in or cough. ?Pain when someone presses on the injured area. ?Feeling short of breath. ?How is this diagnosed? ?This condition is diagnosed with a physical exam and medical history. You may also have imaging tests, such as: ?Chest X-ray. ?CT scan. ?MRI. ?Bone scan. ?Chest ultrasound. ?How is this treated? ?Treatment for this condition depends on the severity of the fracture. Most rib fractures usually heal on their own in 1-3 months. Healing may take longer if you have a cough or if you do activities that make the injury worse. While you heal, you may be given medicines to control the pain. You will also be taught deep breathing exercises. ?Severe injuries may require hospitalization or surgery. ?Follow these instructions at home: ?Managing pain, stiffness, and swelling ?If directed, put ice on the injured area. To do this: ?Put ice in a plastic bag. ?Place a towel between your skin and the bag. ?Leave the ice on for 20 minutes, 2-3 times a day. ?Remove the ice if your skin turns bright red. This is  very important. If you cannot feel pain, heat, or cold, you have a greater risk of damage to the area. ?Take over-the-counter and prescription medicines only as told by your health care provider. ?Activity ?Avoid doing activities or movements that cause pain. Be careful during activities and avoid bumping the injured rib. ?Slowly increase your activity as told by your health care provider. ?General instructions ?Do deep breathing exercises as told by your health care provider. This helps prevent pneumonia, which is a common complication of a broken rib. Your health care provider may instruct you to: ?Take deep breaths several times a day. ?Try to cough several times a day, holding a pillow against the injured area. ?Use a device called incentive spirometer to practice deep breathing several times a day. ?Drink enough fluid to keep your urine pale yellow. ?Do not wear a rib belt or binder. These restrict breathing, which can lead to pneumonia. ?Keep all follow-up visits. This is important. ?Contact a health care provider if: ?You have a fever. ?Get help right away if: ?You have difficulty breathing or you are short of breath. ?You develop a cough that does not stop, or you cough up thick or bloody sputum. ?You have nausea, vomiting, or pain in your abdomen. ?Your pain gets worse and medicine does not help. ?These symptoms may represent a serious problem that is an emergency. Do not wait to see if the symptoms will go away. Get medical help right away. Call your  local emergency services (911 in the [place] Do not drive yourself to the hospital. ?Summary ?A rib fracture is a break or crack in one of the bones of the ribs. ?A broken or cracked rib is often painful but is not usually serious. ?Most rib fractures heal on their own over time. ?Treatment for this condition depends on the severity of the fracture. ?Avoid doing any activities or movements that cause pain. ?This information is not intended to replace advice  given to you by your health care provider. Make sure you discuss any questions you have with your health care provider. ?Document Revised: 06/08/2019 Document Reviewed: 06/08/2019 ?Elsevier Patient Education ? [place]. ? ?

## 2021-05-21 NOTE — Addendum Note (Signed)
Addended by: Rolena Infante on: 05/21/2021 08:38 AM ? ? Modules accepted: Orders ? ?

## 2021-05-27 ENCOUNTER — Ambulatory Visit: Payer: Medicare HMO | Admitting: Pharmacist

## 2021-05-27 DIAGNOSIS — E1142 Type 2 diabetes mellitus with diabetic polyneuropathy: Secondary | ICD-10-CM

## 2021-05-27 DIAGNOSIS — E1165 Type 2 diabetes mellitus with hyperglycemia: Secondary | ICD-10-CM

## 2021-05-27 MED ORDER — GABAPENTIN 100 MG PO CAPS: ORAL_CAPSULE | ORAL | 1 refills | Status: DC

## 2021-05-27 NOTE — Patient Instructions (Addendum)
Visit Information ? ?Following are the goals we discussed today:  ?Current Barriers:  ?Unable to independently afford treatment regimen ?Unable to achieve control of T2DM  ?Suboptimal therapeutic regimen for T2DM ? ?Pharmacist Clinical Goal(s):  ?patient will verbalize ability to afford treatment regimen ?achieve control of T2DM as evidenced by GOAL A1C, IMPROVED TIME IN TARGET RANGE through collaboration with PharmD and provider.  ? ?Interventions: ?1:1 collaboration with Chevis Pretty, FNP regarding development and update of comprehensive plan of care as evidenced by provider attestation and co-signature ?Inter-disciplinary care team collaboration (see longitudinal plan of care) ?Comprehensive medication review performed; medication list updated in electronic medical record ? ?Diabetes: New goal. ?Uncontrolled-A1C 8.2% ; current treatment: toujeo, novolin R, farxiga, metformin ?Need to optimize regimen ?Would introduce GLP1 ?Current glucose readings: fasting glucose: 180-200s, post prandial glucose: >200 ?Discussed meal planning options and Plate method for healthy eating ?Avoid/limit sugary drinks and desserts ?Incorporate balanced protein, non starchy veggies, 1 serving of carbohydrate with each meal ?Increase water intake ?Increase physical activity as able ?Current exercise: n/a; encouraged ?Recommended add GLP1? ?Assessed patient finances. Awaiting patient finances to enroll in patient assistance ? ? ?Patient Goals/Self-Care Activities ?patient will:  ?- take medications as prescribed as evidenced by patient report and record review ?check glucose using CGM, document, and provide at future appointments ?collaborate with provider on medication access solutions ?target a minimum of 150 minutes of moderate intensity exercise weekly ?engage in dietary modifications by  FOLLOWING A HEART HEALTHY DIET/HEALTHY PLATE METHOD (will also help in lowering cholesterol) ? ? ? ?Plan: Telephone follow up appointment  with care management team member scheduled for:  1 month ? ?Signature ?Regina Eck, PharmD, BCPS ?Clinical Pharmacist, [facility] ?[place]  [facility] Phone [phone] ? ? ?Please call the care guide team at [phone] if you need to cancel or reschedule your appointment.  ? ?The patient verbalized understanding of instructions, educational materials, and care plan provided today and declined offer to receive copy of patient instructions, educational materials, and care plan.  ? ?

## 2021-05-27 NOTE — Progress Notes (Signed)
? ? ?Chronic Care Management ?Pharmacy Note ? ?05/27/2021 ?Name:  Alexandra Hunt MRN:  [id number] DOB:  1945-06-08 ? ?Summary: ?Diabetes: New goal. ?Uncontrolled-A1C 8.2% ; current treatment: toujeo, novolin R, farxiga, metformin ?Need to optimize regimen ?Would introduce GLP1 ?Current glucose readings: fasting glucose: 180-200s, post prandial glucose: >200 ?Discussed meal planning options and Plate method for healthy eating ?Avoid/limit sugary drinks and desserts ?Incorporate balanced protein, non starchy veggies, 1 serving of carbohydrate with each meal ?Increase water intake ?Increase physical activity as able ?Current exercise: n/a; encouraged ?Recommended add GLP1? ?Assessed patient finances. Awaiting patient finances to enroll in patient assistance ? ? ?Subjective: ?Alexandra Hunt is an 77 y.o. year old female who is a primary patient of Chevis Pretty, [place].  The CCM team was consulted for assistance with disease management and care coordination needs.   ? ?Engaged with patient by telephone for initial visit in response to provider referral for pharmacy case management and/or care coordination services.  ? ?Consent to Services:  ?The patient was given information about Chronic Care Management services, agreed to services, and gave verbal consent prior to initiation of services.  Please see initial visit note for detailed documentation.  ? ?Patient Care Team: ?Chevis Pretty, FNP as PCP - General (Family Medicine) ?Maisie Fus, MD as Consulting Physician (Obstetrics and Gynecology) ?Michael Boston, MD as Consulting Physician (General Surgery) ?Gatha Mayer, MD as Consulting Physician (Gastroenterology) ?Lavera Guise, [facility] (Pharmacist) ?Gaynelle Arabian, MD as Consulting Physician (Orthopedic Surgery) ?Monna Fam, MD as Consulting Physician (Ophthalmology) ?Katha Cabal, LCSW as [facility] (Licensed [employer] representative) ? ?Objective: ? ?Lab Results   ?Component Value Date  ? CREATININE 1.20 (H) 05/08/2021  ? CREATININE 1.33 (H) 03/03/2021  ? CREATININE 1.09 (H) 11/28/2020  ? ? ?Lab Results  ?Component Value Date  ? HGBA1C 8.2 (H) 05/08/2021  ? ?Last diabetic Eye exam:  ?Lab Results  ?Component Value Date/Time  ? HMDIABEYEEXA Retinopathy (A) 02/16/2021 12:00 AM  ?  ?Last diabetic Foot exam: No results found for: HMDIABFOOTEX  ? ?   ?Component Value Date/Time  ? CHOL 140 05/08/2021 1008  ? CHOL 145 08/30/2012 1422  ? TRIG 223 (H) 05/08/2021 1008  ? TRIG 314 (H) 10/05/2013 1526  ? TRIG 184 (H) 08/30/2012 1422  ? HDL 28 (L) 05/08/2021 1008  ? HDL 30 (L) 10/05/2013 1526  ? HDL 26 (L) 08/30/2012 1422  ? CHOLHDL 5.0 (H) 05/08/2021 1008  ? [place] 75 05/08/2021 1008  ? [place] 68 10/05/2013 1526  ? [place] 82 08/30/2012 1422  ? ? ? ?  Latest Ref Rng & Units 05/08/2021  ? 10:08 AM 03/03/2021  ?  8:13 AM 11/28/2020  ? 10:18 AM  ?Hepatic Function  ?Total Protein 6.0 - 8.5 g/dL 6.5   6.7   6.8    ?Albumin 3.7 - 4.7 g/dL 3.9   4.2   3.9    ?AST 0 - 40 IU/L $Remov'17   17   19    'KkxAPh$ ?ALT 0 - 32 IU/L $Remov'11   9   12    'zUIapJ$ ?Alk Phosphatase 44 - 121 IU/L 62   75   73    ?Total Bilirubin 0.0 - 1.2 mg/dL <0.2   <0.2   <0.2    ? ? ?No results found for: TSH, FREET4 ? ? ?  Latest Ref Rng & Units 05/08/2021  ? 10:08 AM 03/03/2021  ?  8:13 AM 11/28/2020  ? 10:18 AM  ?CBC  ?  WBC 3.4 - 10.8 x10E3/uL 12.4   12.4   13.4    ?Hemoglobin 11.1 - 15.9 g/dL 11.7   11.4   11.1    ?Hematocrit 34.0 - 46.6 % 39.5   36.7   35.7    ?Platelets 150 - 450 x10E3/uL 375   379   395    ? ? ?Lab Results  ?Component Value Date/Time  ? VD25OH 33.8 03/04/2020 03:10 PM  ? VD25OH 17.1 (L) 07/09/2015 04:23 PM  ? ? ?Clinical ASCVD: No  ?The 10-year ASCVD risk score (Arnett DK, et al., 2019) is: 29.3% ?  Values used to calculate the score: ?    Age: 19 years ?    Sex: Female ?    Is Non-Hispanic African American: No ?    Diabetic: Yes ?    Tobacco smoker: No ?    Systolic Blood Pressure: 626 mmHg ?    Is BP treated: Yes ?    HDL  Cholesterol: 28 mg/dL ?    Total Cholesterol: 140 mg/dL   ? ?Other: (CHADS2VASc if Afib, PHQ9 if depression, MMRC or CAT for COPD, ACT, DEXA) ? ?Social History  ? ?Tobacco Use  ?Smoking Status Never  ?Smokeless Tobacco Never  ? ?BP Readings from Last 3 Encounters:  ?05/21/21 126/68  ?05/19/21 (!) 147/51  ?05/15/21 (!) 134/58  ? ?Pulse Readings from Last 3 Encounters:  ?05/21/21 94  ?05/19/21 80  ?05/15/21 81  ? ?Wt Readings from Last 3 Encounters:  ?05/21/21 210 lb (95.3 kg)  ?05/18/21 199 lb 15.3 oz (90.7 kg)  ?05/15/21 200 lb (90.7 kg)  ? ? ?Assessment: Review of patient past medical history, allergies, medications, health status, including review of consultants reports, laboratory and other test data, was performed as part of comprehensive evaluation and provision of chronic care management services.  ? ?SDOH:  (Social Determinants of Health) assessments and interventions performed:  ? ? ?CCM Care Plan ? ?Allergies  ?Allergen Reactions  ? Penicillins   ?  SOB,rash  ? Ivp Dye [Iodinated Contrast Media]   ?  Burning at IV site  ? Statins   ?  myalgias  ? Codeine   ?  nauseated  ? Latex   ?  rash  ? Sulfa Antibiotics   ?  nauseated  ? ? ?Medications Reviewed Today   ? ? Reviewed by Lavera Guise, [facility] (Pharmacist) on 05/27/21 at 58  Med List Status: <None>  ? ?Medication Order Taking? Sig Documenting Provider Last Dose Status Informant  ?albuterol (VENTOLIN HFA) 108 (90 Base) MCG/ACT inhaler [id number] No Inhale 2 puffs into the lungs every 6 (six) hours as needed for wheezing or shortness of breath. Chevis Pretty, FNP Taking Active   ?Alcohol Swabs (B-D SINGLE USE SWABS REGULAR) PADS [id number] No TEST BS UP TO FOUR TIMES DAILY Dx E11.9 Chevis Pretty, FNP Taking Active   ?ALPHAGAN P 0.1 % SOLN [id number] No Apply 1 drop to eye 2 (two) times daily. [provider] Taking Active   ?aspirin 81 MG tablet [id number] No Take 81 mg by mouth daily. [provider] Taking Active   ?Blood  Glucose Calibration (TRUE METRIX LEVEL 2) Normal SOLN [id number] No Use with glucose machine Dx E11.9 Chevis Pretty, FNP Taking Active   ?Blood Glucose Monitoring Suppl (TRUE METRIX METER) w/Device KIT [id number] No TEST BS UP TO FOUR TIMES DAILY Dx E11.9 Chevis Pretty, FNP Taking Active   ?Continuous Blood Gluc Receiver (FREESTYLE LIBRE 2 READER) DEVI [id number] No Use to test  blood sugar 4-6 times daily as directed DX: E11.9 Chevis Pretty, FNP Taking Active   ?Continuous Blood Gluc Sensor (FREESTYLE LIBRE 2 SENSOR) MISC [id number] No Use to test blood sugar 4-6 times daily as directed DX: E11.9 Chevis Pretty, FNP Taking Active   ?[employer] LUBRICANT EYE DROPS 0.6 % SOLN [id number] No Apply 1 drop to eye 2 (two) times daily. [provider] Taking Active   ?dapagliflozin propanediol (FARXIGA) 5 MG TABS tablet [id number] No Take 1 tablet (5 mg total) by mouth daily. Hassell Done, Mary-Margaret, FNP Taking Active   ?diclofenac (VOLTAREN) 75 MG EC tablet [id number] No TAKE 1 TABLET BY MOUTH TWICE A DAY AS NEEDED Hassell Done, Mary-Margaret, FNP Taking Active   ?dorzolamide (TRUSOPT) 2 % ophthalmic solution [id number] No Place 2 drops into both eyes 2 (two) times daily. [provider] Taking Active   ?fenofibrate 160 MG tablet [id number] No Take 1 tablet (160 mg total) by mouth daily. Hassell Done, Mary-Margaret, FNP Taking Active   ?fluticasone [facility]) 50 MCG/ACT nasal spray [id number] No USE 2 SPRAYS IN EACH NOSTRIL ONCE DAILY Chevis Pretty, FNP Taking Active   ?furosemide (LASIX) 20 MG tablet [id number] No TAKE 1 TABLET BY MOUTH EVERY DAY Hassell Done, Mary-Margaret, FNP Taking Active   ?gabapentin (NEURONTIN) 100 MG capsule [id number] No Take 1 capsule (100 mg total) by mouth 2 (two) times daily. Hassell Done, Mary-Margaret, FNP Taking Active   ?glucose blood (TRUE METRIX BLOOD GLUCOSE TEST) test strip [id number] No TEST BS UP TO FOUR TIMES DAILY Dx E11.9 Chevis Pretty, FNP Taking Active    ?ibuprofen (ADVIL,MOTRIN) 800 MG tablet [id number] No TAKE 1 TABLET BY MOUTH EVERY 8 HOURS AS NEEDED Hassell Done, Mary-Margaret, FNP Taking Active   ?insulin glargine, 1 Unit Dial, (TOUJEO SOLOSTAR) 300 UNIT/ML Solostar Pen [phone]

## 2021-05-30 LAB — TOXASSURE SELECT 13 (MW), URINE

## 2021-06-01 NOTE — Telephone Encounter (Signed)
I do not know. I am just going by test results ?

## 2021-06-15 DIAGNOSIS — E113293 Type 2 diabetes mellitus with mild nonproliferative diabetic retinopathy without macular edema, bilateral: Secondary | ICD-10-CM | POA: Diagnosis not present

## 2021-06-15 DIAGNOSIS — H18591 Other hereditary corneal dystrophies, right eye: Secondary | ICD-10-CM | POA: Diagnosis not present

## 2021-06-15 DIAGNOSIS — H52213 Irregular astigmatism, bilateral: Secondary | ICD-10-CM | POA: Diagnosis not present

## 2021-06-15 DIAGNOSIS — H524 Presbyopia: Secondary | ICD-10-CM | POA: Diagnosis not present

## 2021-06-15 DIAGNOSIS — H40023 Open angle with borderline findings, high risk, bilateral: Secondary | ICD-10-CM | POA: Diagnosis not present

## 2021-06-15 DIAGNOSIS — H40033 Anatomical narrow angle, bilateral: Secondary | ICD-10-CM | POA: Diagnosis not present

## 2021-06-15 LAB — HM DIABETES EYE EXAM

## 2021-06-22 DIAGNOSIS — E1165 Type 2 diabetes mellitus with hyperglycemia: Secondary | ICD-10-CM | POA: Diagnosis not present

## 2021-07-02 ENCOUNTER — Other Ambulatory Visit: Payer: Self-pay | Admitting: Nurse Practitioner

## 2021-07-02 ENCOUNTER — Telehealth: Payer: Self-pay | Admitting: Pharmacist

## 2021-07-02 DIAGNOSIS — E1142 Type 2 diabetes mellitus with diabetic polyneuropathy: Secondary | ICD-10-CM

## 2021-07-02 DIAGNOSIS — I10 Essential (primary) hypertension: Secondary | ICD-10-CM

## 2021-07-02 MED ORDER — DAPAGLIFLOZIN PROPANEDIOL 5 MG PO TABS: 5.0000 mg | ORAL_TABLET | Freq: Every day | ORAL | 3 refills | Status: DC

## 2021-07-02 NOTE — Telephone Encounter (Signed)
FARXIGA REFILLS FOR AZ&ME PATIENT ASSISTANCE SENT TO [facility] ? ?

## 2021-07-06 ENCOUNTER — Telehealth: Payer: Medicare HMO

## 2021-07-07 DIAGNOSIS — H2511 Age-related nuclear cataract, right eye: Secondary | ICD-10-CM | POA: Diagnosis not present

## 2021-07-07 DIAGNOSIS — H25811 Combined forms of age-related cataract, right eye: Secondary | ICD-10-CM | POA: Diagnosis not present

## 2021-07-07 DIAGNOSIS — H25041 Posterior subcapsular polar age-related cataract, right eye: Secondary | ICD-10-CM | POA: Diagnosis not present

## 2021-07-10 ENCOUNTER — Telehealth: Payer: Self-pay | Admitting: Nurse Practitioner

## 2021-07-17 ENCOUNTER — Telehealth: Payer: Medicare HMO

## 2021-08-03 DIAGNOSIS — H2512 Age-related nuclear cataract, left eye: Secondary | ICD-10-CM | POA: Diagnosis not present

## 2021-08-03 DIAGNOSIS — H25012 Cortical age-related cataract, left eye: Secondary | ICD-10-CM | POA: Diagnosis not present

## 2021-08-11 ENCOUNTER — Ambulatory Visit: Payer: Medicare HMO | Admitting: Nurse Practitioner

## 2021-08-11 DIAGNOSIS — H2512 Age-related nuclear cataract, left eye: Secondary | ICD-10-CM | POA: Diagnosis not present

## 2021-08-11 DIAGNOSIS — H25812 Combined forms of age-related cataract, left eye: Secondary | ICD-10-CM | POA: Diagnosis not present

## 2021-08-25 ENCOUNTER — Telehealth: Payer: Self-pay | Admitting: Nurse Practitioner

## 2021-08-25 ENCOUNTER — Other Ambulatory Visit: Payer: Self-pay | Admitting: *Deleted

## 2021-08-25 MED ORDER — TRUEDRAW LANCING DEVICE MISC: 1 refills | Status: AC

## 2021-08-25 MED ORDER — TRUE METRIX LEVEL 1 LOW VI SOLN
1 refills | Status: AC
Start: 2021-08-25 — End: ?

## 2021-08-25 MED ORDER — TRUEPLUS LANCETS 33G MISC: 3 refills | Status: AC

## 2021-08-25 MED ORDER — TRUE METRIX BLOOD GLUCOSE TEST VI STRP
ORAL_STRIP | 3 refills | Status: AC
Start: 2021-08-25 — End: ?

## 2021-08-25 MED ORDER — BD SWAB SINGLE USE REGULAR PADS: MEDICATED_PAD | 3 refills | Status: AC

## 2021-08-25 NOTE — Telephone Encounter (Signed)
Refills sent to pharmacy. 

## 2021-08-26 ENCOUNTER — Other Ambulatory Visit: Payer: Self-pay | Admitting: Nurse Practitioner

## 2021-08-27 ENCOUNTER — Telehealth: Payer: Self-pay | Admitting: Nurse Practitioner

## 2021-08-27 DIAGNOSIS — Z794 Long term (current) use of insulin: Secondary | ICD-10-CM

## 2021-08-28 ENCOUNTER — Ambulatory Visit: Payer: Medicare HMO | Admitting: Nurse Practitioner

## 2021-08-28 MED ORDER — FREESTYLE LIBRE 14 DAY SENSOR MISC: 5 refills | Status: DC

## 2021-08-28 NOTE — Telephone Encounter (Signed)
Per chart, sensor order was sent to pharmacy on 08/28/21.

## 2021-08-28 NOTE — Addendum Note (Signed)
Addended by: Antonietta Barcelona D on: 08/28/2021 09:26 AM   Modules accepted: Orders

## 2021-08-28 NOTE — Telephone Encounter (Signed)
Pt called to make sure MMM received this request. Says she needs it filled out and sent to pharmacy today if possible.

## 2021-09-09 ENCOUNTER — Encounter: Payer: Self-pay | Admitting: Nurse Practitioner

## 2021-09-09 ENCOUNTER — Ambulatory Visit (INDEPENDENT_AMBULATORY_CARE_PROVIDER_SITE_OTHER): Payer: Medicare HMO | Admitting: Nurse Practitioner

## 2021-09-09 VITALS — BP 127/70 | HR 83 | Temp 97.2°F | Resp 20 | Ht 64.0 in | Wt 203.0 lb

## 2021-09-09 DIAGNOSIS — F411 Generalized anxiety disorder: Secondary | ICD-10-CM

## 2021-09-09 DIAGNOSIS — R7989 Other specified abnormal findings of blood chemistry: Secondary | ICD-10-CM | POA: Diagnosis not present

## 2021-09-09 DIAGNOSIS — F339 Major depressive disorder, recurrent, unspecified: Secondary | ICD-10-CM | POA: Diagnosis not present

## 2021-09-09 DIAGNOSIS — E782 Mixed hyperlipidemia: Secondary | ICD-10-CM

## 2021-09-09 DIAGNOSIS — E1165 Type 2 diabetes mellitus with hyperglycemia: Secondary | ICD-10-CM

## 2021-09-09 DIAGNOSIS — E1142 Type 2 diabetes mellitus with diabetic polyneuropathy: Secondary | ICD-10-CM

## 2021-09-09 DIAGNOSIS — N393 Stress incontinence (female) (male): Secondary | ICD-10-CM

## 2021-09-09 DIAGNOSIS — G4733 Obstructive sleep apnea (adult) (pediatric): Secondary | ICD-10-CM | POA: Diagnosis not present

## 2021-09-09 DIAGNOSIS — Z794 Long term (current) use of insulin: Secondary | ICD-10-CM | POA: Diagnosis not present

## 2021-09-09 DIAGNOSIS — M8588 Other specified disorders of bone density and structure, other site: Secondary | ICD-10-CM

## 2021-09-09 DIAGNOSIS — I10 Essential (primary) hypertension: Secondary | ICD-10-CM

## 2021-09-09 DIAGNOSIS — G5603 Carpal tunnel syndrome, bilateral upper limbs: Secondary | ICD-10-CM

## 2021-09-09 DIAGNOSIS — Z6832 Body mass index (BMI) 32.0-32.9, adult: Secondary | ICD-10-CM

## 2021-09-09 LAB — BAYER DCA HB A1C WAIVED: HB A1C (BAYER DCA - WAIVED): 9 % — ABNORMAL HIGH (ref 4.8–5.6)

## 2021-09-09 MED ORDER — FENOFIBRATE 160 MG PO TABS: 160.0000 mg | ORAL_TABLET | Freq: Every day | ORAL | 1 refills | Status: DC

## 2021-09-09 MED ORDER — FUROSEMIDE 20 MG PO TABS: ORAL_TABLET | ORAL | 1 refills | Status: DC

## 2021-09-09 MED ORDER — IBUPROFEN 800 MG PO TABS: 800.0000 mg | ORAL_TABLET | Freq: Three times a day (TID) | ORAL | 1 refills | Status: DC | PRN

## 2021-09-09 MED ORDER — LISINOPRIL 20 MG PO TABS: 20.0000 mg | ORAL_TABLET | Freq: Every day | ORAL | 1 refills | Status: DC

## 2021-09-09 MED ORDER — SERTRALINE HCL 100 MG PO TABS: 200.0000 mg | ORAL_TABLET | Freq: Every day | ORAL | 1 refills | Status: DC

## 2021-09-09 MED ORDER — LORAZEPAM 1 MG PO TABS: 1.0000 mg | ORAL_TABLET | Freq: Three times a day (TID) | ORAL | 2 refills | Status: DC

## 2021-09-09 MED ORDER — GABAPENTIN 100 MG PO CAPS: ORAL_CAPSULE | ORAL | 1 refills | Status: DC

## 2021-09-09 MED ORDER — TOLTERODINE TARTRATE ER 4 MG PO CP24: 4.0000 mg | ORAL_CAPSULE | Freq: Every day | ORAL | 1 refills | Status: DC

## 2021-09-09 NOTE — Patient Instructions (Signed)

## 2021-09-09 NOTE — Progress Notes (Signed)
Subjective:    Patient ID: Alexandra Hunt, female    DOB: 1946-02-10, 76 y.o.   MRN: [id number]   Chief Complaint: Medical Management of Chronic Issues    HPI:  Alexandra Hunt is a 76 y.o. who identifies as a female who was assigned female at birth.   Social history: Lives with: her children and grandchildren Work history: retired Stage manager   Comes in today for follow up of the following chronic medical issues:  1. Type 2 diabetes mellitus with diabetic polyneuropathy, with long-term current use of insulin (HCC) Fasting blood sugars are running 130->200. She doe sot really watch her diet at all. Lab Results  Component Value Date   HGBA1C 8.2 (H) 05/08/2021     2. Essential hypertension, malignant No c/o chest pain, sob or headache. Does not check blood pressure at home. BP Readings from Last 3 Encounters:  09/09/21 127/70  05/21/21 126/68  05/19/21 (!) 147/51     3. Mixed hyperlipidemia Does not watch diet and does no exercise. Refuses statin therapy due to myalgia. Lab Results  Component Value Date   CHOL 140 05/08/2021   HDL 28 (L) 05/08/2021   LDLCALC 75 05/08/2021   TRIG 223 (H) 05/08/2021   CHOLHDL 5.0 (H) 05/08/2021     4. Uncontrolled type 2 diabetes mellitus with hyperglycemia ([place]) Does not watch diet. See above note  5. Diabetic polyneuropathy associated with type 2 diabetes mellitus (HCC) Has numbness in tingling in bil feet  6. Stress incontinence of urine Is on detrol LA f=daily which helps with frequency  7. Episode of recurrent major depressive disorder, unspecified depression episode severity ([place]) Has been very depressed since her husband died several years ago. She is on zoloft. Depression stays fairly stable.    05/13/2021   10:24 AM 05/08/2021   10:14 AM 03/18/2021    2:19 PM  Depression screen PHQ 2/9  Decreased Interest 1 2 2   Down, Depressed, Hopeless 1 2 2   PHQ - 2 Score 2 4 4   Altered sleeping 2 2 2   Tired, decreased  energy 1 2 2   Change in appetite 1 0 0  Feeling bad or failure about yourself  1 1 1   Trouble concentrating 1 0 0  Moving slowly or fidgety/restless 1 1 1   Suicidal thoughts 0 0 0  PHQ-9 Score 9 10 10   Difficult doing work/chores Somewhat difficult Somewhat difficult Somewhat difficult     8. GAD (generalized anxiety disorder) Is anxious and cries a lot.takes ativan 3x a day.    05/08/2021   10:15 AM 03/18/2021    2:30 PM 03/03/2021    8:23 AM 06/23/2020    2:57 PM  GAD 7 : Generalized Anxiety Score  Nervous, Anxious, on Edge 2 2 3 3   Control/stop worrying 2 2 3 3   Worry too much - different things 2 2 3 3   Trouble relaxing 1 1 1 1   Restless 1 1 0 0  Easily annoyed or irritable 0 0 0 0  Afraid - awful might happen 1 1 0 3  Total GAD 7 Score 9 9 10 13   Anxiety Difficulty Somewhat difficult Somewhat difficult Not difficult at all Not difficult at all      9. Low serum vitamin D Last vitamin D Lab Results  Component Value Date   VD25OH 33.8 03/04/2020     10. Obstructive sleep apnea syndrome Does not wear cpap at night  11. Osteopenia of lumbar spine Last dexascan  was done in 2017. She has refused to have repeated. She does no weight bearing exercises.  12. BMI 32.0-32.9,adult No recent weight changes Wt Readings from Last 3 Encounters:  09/09/21 203 lb (92.1 kg)  05/21/21 210 lb (95.3 kg)  05/18/21 199 lb 15.3 oz (90.7 kg)   BMI Readings from Last 3 Encounters:  09/09/21 34.84 kg/m  05/21/21 36.05 kg/m  05/18/21 34.32 kg/m      New complaints: Has bil carpal tunnel and is going to have surgery soon. Pain is 5-6/10 daily.   Allergies  Allergen Reactions   Penicillins     SOB,rash   Ivp Dye [Iodinated Contrast Media]     Burning at IV site   Statins     myalgias   Codeine     nauseated   Latex     rash   Sulfa Antibiotics     nauseated   Outpatient Encounter Medications as of 09/09/2021  Medication Sig   albuterol (VENTOLIN HFA) 108 (90 Base)  MCG/ACT inhaler Inhale 2 puffs into the lungs every 6 (six) hours as needed for wheezing or shortness of breath.   Alcohol Swabs (B-D SINGLE USE SWABS REGULAR) PADS TEST BS UP TO FOUR TIMES DAILY Dx E11.9   ALPHAGAN P 0.1 % SOLN Apply 1 drop to eye 2 (two) times daily.   aspirin 81 MG tablet Take 81 mg by mouth daily.   Blood Glucose Calibration (TRUE METRIX LEVEL 1) Low SOLN Use with glucose machine Dx E11.9,   Blood Glucose Monitoring Suppl (TRUE METRIX METER) w/Device KIT TEST BS UP TO FOUR TIMES DAILY Dx E11.9   Continuous Blood Gluc Receiver (FREESTYLE LIBRE 2 READER) DEVI Use to test blood sugar 4-6 times daily as directed DX: E11.9   Continuous Blood Gluc Sensor (FREESTYLE LIBRE 14 DAY SENSOR) MISC Change every 14 days as directed Dx E11.9   CVS LUBRICANT EYE DROPS 0.6 % SOLN Apply 1 drop to eye 2 (two) times daily.   dapagliflozin propanediol (FARXIGA) 5 MG TABS tablet Take 1 tablet (5 mg total) by mouth daily.   diclofenac (VOLTAREN) 75 MG EC tablet TAKE 1 TABLET BY MOUTH TWICE A DAY AS NEEDED   dorzolamide (TRUSOPT) 2 % ophthalmic solution Place 2 drops into both eyes 2 (two) times daily.   fenofibrate 160 MG tablet Take 1 tablet (160 mg total) by mouth daily.   fluticasone (FLONASE) 50 MCG/ACT nasal spray USE 2 SPRAYS IN EACH NOSTRIL ONCE DAILY   furosemide (LASIX) 20 MG tablet TAKE 1 TABLET BY MOUTH EVERY DAY   gabapentin (NEURONTIN) 100 MG capsule Take 1 capsule ($RemoveBe'100mg'wXLrgEgIL$ ) by mouth in the morning, 1 capsule ($RemoveBe'100mg'lDGtMjGoS$ ) in the afternoon, & 2 capsules ($RemoveBef'200mg'LXcGdqEwzn$ ) in the evening   glucose blood (TRUE METRIX BLOOD GLUCOSE TEST) test strip TEST BS UP TO FOUR TIMES DAILY Dx E11.9   ibuprofen (ADVIL,MOTRIN) 800 MG tablet TAKE 1 TABLET BY MOUTH EVERY 8 HOURS AS NEEDED   insulin glargine, 1 Unit Dial, (TOUJEO SOLOSTAR) 300 UNIT/ML Solostar Pen Inject 90 Units into the skin in the morning and at bedtime.   Insulin Pen Needle (B-D UF III MINI PEN NEEDLES) 31G X 5 MM MISC USE WITH SLIDING SCALE HUMALOG 4  TIMES DAILY DX: E11.9   insulin regular (NOVOLIN R) 100 units/mL injection INJECT 22UNITS 4 TIMES A DAY PER SLIDING SCALE   Insulin Syringe-Needle U-100 (TRUEPLUS INSULIN SYRINGE) 31G X 5/16" 1 ML MISC Use daily with Lantus & Humalog insulin sliding scales as instructed Dx  E11.9   Lancet Devices (TRUEDRAW LANCING DEVICE) MISC TEST BS UP TO FOUR TIMES DAILY Dx E11.9   lidocaine (LIDODERM) 5 % Place 1 patch onto the skin daily. Remove & Discard patch within 12 hours or as directed by MD   lisinopril (ZESTRIL) 20 MG tablet TAKE 1 TABLET BY MOUTH ONCE DAILY   LORazepam (ATIVAN) 1 MG tablet Take 1 tablet (1 mg total) by mouth 3 (three) times daily.   magic mouthwash SOLN Take 5 mLs by mouth 4 (four) times daily.   meclizine (ANTIVERT) 25 MG tablet Take 1 tablet (25 mg total) by mouth 3 (three) times daily as needed for dizziness.   meloxicam (MOBIC) 15 MG tablet Take 15 mg by mouth daily.   mupirocin cream (BACTROBAN) 2 % Apply 1 application topically 2 (two) times daily.   ondansetron (ZOFRAN) 4 MG tablet    sertraline (ZOLOFT) 100 MG tablet Take 2 tablets (200 mg total) by mouth daily.   silver sulfADIAZINE (SILVADENE) 1 % cream Apply 1 application topically 2 (two) times daily.   tolterodine (DETROL LA) 4 MG 24 hr capsule Take 1 capsule (4 mg total) by mouth daily.   traMADol (ULTRAM) 50 MG tablet Take 50 mg by mouth.   TRUEplus Lancets 33G MISC TEST BS UP TO FOUR TIMES DAILY Dx E11.9   Vitamin D, Ergocalciferol, (DRISDOL) 1.25 MG (50000 UNIT) CAPS capsule TAKE 1 CAPSULE EVERY 7 DAYS   [DISCONTINUED] metFORMIN (GLUCOPHAGE) 500 MG tablet Take 1 tablet (500 mg total) by mouth 2 (two) times daily with a meal.   No facility-administered encounter medications on file as of 09/09/2021.    Past Surgical History:  Procedure Laterality Date   ABDOMINAL HYSTERECTOMY     due to uterine cancer   BREAST SURGERY     left breast biopsy/benign   TUBAL LIGATION      Family History  Problem Relation Age  of Onset   Diabetes Mother    Blindness Mother        related to diabetes   Stroke Mother    Heart disease Father        MI   Heart attack Father    Heart defect Father    Breast cancer Sister    Diabetes Brother        diet controlled   Colon cancer Neg Hx    Esophageal cancer Neg Hx    Rectal cancer Neg Hx    Stomach cancer Neg Hx       Controlled substance contract: 03/10/21     Review of Systems  Constitutional:  Negative for diaphoresis.  Eyes:  Negative for pain.  Respiratory:  Negative for shortness of breath.   Cardiovascular:  Negative for chest pain, palpitations and leg swelling.  Gastrointestinal:  Negative for abdominal pain.  Endocrine: Negative for polydipsia.  Skin:  Negative for rash.  Neurological:  Negative for dizziness, weakness and headaches.  Hematological:  Does not bruise/bleed easily.  All other systems reviewed and are negative.      Objective:   Physical Exam Vitals and nursing note reviewed.  Constitutional:      General: She is not in acute distress.    Appearance: Normal appearance. She is well-developed.  HENT:     Head: Normocephalic.     Right Ear: Tympanic membrane normal.     Left Ear: Tympanic membrane normal.     Nose: Nose normal.     Mouth/Throat:     Mouth: Mucous membranes are  moist.  Eyes:     Pupils: Pupils are equal, round, and reactive to light.  Neck:     Vascular: No carotid bruit or JVD.  Cardiovascular:     Rate and Rhythm: Normal rate and regular rhythm.     Heart sounds: Normal heart sounds.  Pulmonary:     Effort: Pulmonary effort is normal. No respiratory distress.     Breath sounds: Normal breath sounds. No wheezing or rales.  Chest:     Chest wall: No tenderness.  Abdominal:     General: Bowel sounds are normal. There is no distension or abdominal bruit.     Palpations: Abdomen is soft. There is no hepatomegaly, splenomegaly, mass or pulsatile mass.     Tenderness: There is no abdominal  tenderness.  Musculoskeletal:        General: Normal range of motion.     Cervical back: Normal range of motion and neck supple.  Lymphadenopathy:     Cervical: No cervical adenopathy.  Skin:    General: Skin is warm and dry.  Neurological:     Mental Status: She is alert and oriented to person, place, and time.     Deep Tendon Reflexes: Reflexes are normal and symmetric.  Psychiatric:        Behavior: Behavior normal.        Thought Content: Thought content normal.        Judgment: Judgment normal.      BP 127/70   Pulse 83   Temp (!) 97.2 F (36.2 C) (Temporal)   Resp 20   Ht $R'5\' 4"'DC$  (1.626 m)   Wt 203 lb (92.1 kg)   SpO2 97%   BMI 34.84 kg/m   Hgba1c 9.0%     Assessment & Plan:   ANYSSA SHARPLESS comes in today with chief complaint of Medical Management of Chronic Issues   Diagnosis and orders addressed:  1. Essential hypertension, malignant Low sodium diet - CBC with Differential/Platelet - CMP14+EGFR - lisinopril (ZESTRIL) 20 MG tablet; Take 1 tablet (20 mg total) by mouth daily.  Dispense: 90 tablet; Refill: 1 - furosemide (LASIX) 20 MG tablet; TAKE 1 TABLET BY MOUTH EVERY DAY  Dispense: 90 tablet; Refill: 1  2. Mixed hyperlipidemia Low fat diet - Lipid panel - fenofibrate 160 MG tablet; Take 1 tablet (160 mg total) by mouth daily.  Dispense: 90 tablet; Refill: 1  3. Uncontrolled type 2 diabetes mellitus with hyperglycemia ([place]) Watch carbs in diet Will make appointment to see clinical pharmacist - [facility] DCA Hb A1c Waived  4. Diabetic polyneuropathy associated with type 2 diabetes mellitus ([place]) Do not go bare footed Check feet daily - gabapentin (NEURONTIN) 100 MG capsule; Take 1 capsule ($RemoveBe'100mg'tTgsIzwGX$ ) by mouth in the morning, 1 capsule ($RemoveBe'100mg'BfbZSQEtu$ ) in the afternoon, & 2 capsules ($RemoveBef'200mg'rfmcsaMuTv$ ) in the evening  Dispense: 360 capsule; Refill: 1  5. Stress incontinence of urine - tolterodine (DETROL LA) 4 MG 24 hr capsule; Take 1 capsule (4 mg total) by mouth daily.   Dispense: 90 capsule; Refill: 1  6. Episode of recurrent major depressive disorder, unspecified depression episode severity ([place]) Stress management - sertraline (ZOLOFT) 100 MG tablet; Take 2 tablets (200 mg total) by mouth daily.  Dispense: 180 tablet; Refill: 1  7. GAD (generalized anxiety disorder) - LORazepam (ATIVAN) 1 MG tablet; Take 1 tablet (1 mg total) by mouth 3 (three) times daily.  Dispense: 90 tablet; Refill: 2  8. Low serum vitamin D Daily vitamind supplement  9. Obstructive  sleep apnea syndrome Ordered a new CPAP - For home use only DME continuous positive airway pressure (CPAP)  10. Osteopenia of lumbar spine Weight bearing exercises  11. BMI 32.0-32.9,adult Discussed diet and exercise for person with BMI >25 Will recheck weight in 3-6 months   12. Bilateral carpal tunnel syndrome Keep appointment for surgery - ibuprofen (ADVIL) 800 MG tablet; Take 1 tablet (800 mg total) by mouth every 8 (eight) hours as needed.  Dispense: 30 tablet; Refill: 1   Labs pending Health Maintenance reviewed Diet and exercise encouraged  Follow up plan: 3 months   Mary-Margaret Hassell Done, FNP

## 2021-09-10 DIAGNOSIS — E1165 Type 2 diabetes mellitus with hyperglycemia: Secondary | ICD-10-CM | POA: Diagnosis not present

## 2021-09-10 LAB — CMP14+EGFR
ALT: 16 IU/L (ref 0–32)
AST: 17 IU/L (ref 0–40)
Albumin/Globulin Ratio: 1.6 (ref 1.2–2.2)
Albumin: 4.2 g/dL (ref 3.8–4.8)
Alkaline Phosphatase: 81 IU/L (ref 44–121)
BUN/Creatinine Ratio: 20 (ref 12–28)
BUN: 21 mg/dL (ref 8–27)
Bilirubin Total: <0.2 mg/dL (ref 0.0–1.2)
CO2: 25 mmol/L (ref 20–29)
Calcium: 9 mg/dL (ref 8.7–10.3)
Chloride: 102 mmol/L (ref 96–106)
Creatinine, Ser: 1.04 mg/dL — ABNORMAL HIGH (ref 0.57–1.00)
Globulin, Total: 2.7 g/dL (ref 1.5–4.5)
Glucose: 118 mg/dL — ABNORMAL HIGH (ref 70–99)
Potassium: 4.2 mmol/L (ref 3.5–5.2)
Sodium: 141 mmol/L (ref 134–144)
Total Protein: 6.9 g/dL (ref 6.0–8.5)
eGFR: 56 mL/min/{1.73_m2} — ABNORMAL LOW (ref >59)

## 2021-09-10 LAB — LIPID PANEL
Chol/HDL Ratio: 6.1 ratio — ABNORMAL HIGH (ref 0.0–4.4)
Cholesterol, Total: 158 mg/dL (ref 100–199)
HDL: 26 mg/dL — ABNORMAL LOW (ref >39)
LDL Chol Calc (NIH): 77 mg/dL (ref 0–99)
Triglycerides: 341 mg/dL — ABNORMAL HIGH (ref 0–149)
VLDL Cholesterol Cal: 55 mg/dL — ABNORMAL HIGH (ref 5–40)

## 2021-09-10 LAB — CBC WITH DIFFERENTIAL/PLATELET
Basophils Absolute: 0.1 10*3/uL (ref 0.0–0.2)
Basos: 1 %
EOS (ABSOLUTE): 0.1 10*3/uL (ref 0.0–0.4)
Eos: 1 %
Hematocrit: 36.9 % (ref 34.0–46.6)
Hemoglobin: 11.5 g/dL (ref 11.1–15.9)
Immature Grans (Abs): 0 10*3/uL (ref 0.0–0.1)
Immature Granulocytes: 0 %
Lymphocytes Absolute: 2.9 10*3/uL (ref 0.7–3.1)
Lymphs: 27 %
MCH: 23.4 pg — ABNORMAL LOW (ref 26.6–33.0)
MCHC: 31.2 g/dL — ABNORMAL LOW (ref 31.5–35.7)
MCV: 75 fL — ABNORMAL LOW (ref 79–97)
Monocytes Absolute: 0.7 10*3/uL (ref 0.1–0.9)
Monocytes: 6 %
Neutrophils Absolute: 6.8 10*3/uL (ref 1.4–7.0)
Neutrophils: 65 %
Platelets: 346 10*3/uL (ref 150–450)
RBC: 4.92 x10E6/uL (ref 3.77–5.28)
RDW: 15.4 % (ref 11.7–15.4)
WBC: 10.5 10*3/uL (ref 3.4–10.8)

## 2021-09-11 ENCOUNTER — Telehealth: Payer: Self-pay

## 2021-09-11 NOTE — Chronic Care Management (AMB) (Signed)
  Chronic Care Management Note  09/11/2021 Name: [name] MRN: [id number] DOB: Aug 21, 1945  Alexandra Hunt is a 76 y.o. year old female who is a primary care patient of Chevis Pretty, [place] and is actively engaged with the care management team. I reached out to [name] by phone today to assist with scheduling a follow up visit with the Pharmacist  Follow up plan: Unsuccessful telephone outreach attempt made. A HIPAA compliant phone message was left for the patient providing contact information and requesting a return call.  The care management team will reach out to the patient again over the next 7 days.  If patient returns call to provider office, please advise to call [facility]  at [place], [place], [place] [address] Direct Dial: [phone] [email or web address]

## 2021-09-14 NOTE — Chronic Care Management (AMB) (Signed)
  Chronic Care Management Note  09/14/2021 Name: [name] MRN: [id number] DOB: 03-28-1945  Alexandra Hunt is a 76 y.o. year old female who is a primary care patient of Chevis Pretty, [place] and is actively engaged with the care management team. I reached out to [name] by phone today to assist with scheduling a follow up visit with the Pharmacist  Follow up plan: Telephone appointment with care management team member scheduled for:10/16/2021  Noreene Larsson, [place], [place] [address] Direct Dial: [phone] [email or web address]

## 2021-09-22 ENCOUNTER — Ambulatory Visit: Payer: Medicare HMO | Admitting: *Deleted

## 2021-09-23 ENCOUNTER — Encounter: Payer: Self-pay | Admitting: *Deleted

## 2021-09-23 NOTE — Patient Instructions (Signed)
Visit Information  Thank you for taking time to visit with me today. Please don't hesitate to contact me if I can be of assistance to you before our next scheduled telephone appointment.  Please call the care guide team at [phone] if you need to cancel or reschedule your appointment.   If you are experiencing a Mental Health or [place] or need someone to talk to, please call the Suicide and Crisis Lifeline: 988 call the [place] National Suicide Prevention Lifeline: 217-674-7718 or TTY: 425-689-8981 TTY 412-228-6066) to talk to a trained counselor call 1-800-273-TALK (toll free, 24 hour hotline) go to [facility] [address] [place] [phone]) call the [place]: [phone] call 911   Ms. Slovacek was given information about Care Management services by the embedded care coordination team including:  Care Management services include personalized support from designated clinical staff supervised by her physician, including individualized plan of care and coordination with other care providers 24/7 contact phone numbers for assistance for urgent and routine care needs. The patient may stop CCM services at any time (effective at the end of the month) by phone call to the office staff.  Patient agreed to services and verbal consent obtained.   Patient verbalizes understanding of instructions and care plan provided today and agrees to view in [place]. Active MyChart status and patient understanding of how to access instructions and care plan via MyChart confirmed with patient.     No further follow up required.  Nat Christen, BSW, MSW, LCSW  Licensed Education officer, [facility]  Mailing [place] [address], [place], [place] [address] Physical Address-[address] [place],  [address] Toll Free Main # [phone] Fax # [phone] Cell #  [phone] [email or web address]

## 2021-09-23 NOTE — Patient Outreach (Signed)
  Care Coordination   Initial Visit Note   09/23/2021 Name: Alexandra Hunt MRN: [id number] DOB: Jun 30, 1945  Alexandra Hunt is a 76 y.o. year old female who sees Chevis Pretty, FNP for primary care. I spoke with  [name] by phone today.  What matters to the patients health and wellness today?  No Interventions Identified.   Goals Addressed   None     SDOH assessments and interventions completed:   Yes SDOH Interventions Today    Flowsheet Row Most Recent Value  SDOH Interventions   Food Insecurity Interventions Intervention Not Indicated  Financial Strain Interventions Intervention Not Indicated  Housing Interventions Intervention Not Indicated  Physical Activity Interventions Patient Refused  Stress Interventions Intervention Not Indicated  Social Connections Interventions Patient Refused  Transportation Interventions Intervention Not Indicated       Care Coordination Interventions Activated:  No  Care Coordination Interventions:  No, not indicated.  Follow up plan: No further intervention required.  Encounter Outcome:  Pt. Visit Completed.  Nat Christen, BSW, MSW, LCSW  Licensed Education officer, environmental [facility]  Mailing [place] [address], [place], [place] [address] Physical Address-[address] [place], [place] [address] Toll Free Main # [phone] Fax # [phone] Cell # [phone] [email or web address]

## 2021-09-25 ENCOUNTER — Other Ambulatory Visit: Payer: Self-pay | Admitting: Nurse Practitioner

## 2021-09-30 ENCOUNTER — Telehealth: Payer: Self-pay | Admitting: Nurse Practitioner

## 2021-10-02 ENCOUNTER — Other Ambulatory Visit: Payer: Self-pay | Admitting: *Deleted

## 2021-10-02 NOTE — Patient Outreach (Signed)
  Care Coordination   10/02/2021  Name: HEIDIE KRALL MRN: [id number] DOB: May 27, 1945   Care Coordination Outreach Attempts:  A second unsuccessful outreach was attempted today to offer the patient with information about available care coordination services as a benefit of their health plan.   A HIPAA compliant message was left on voicemail, providing contact information for CSW, encouraging patient to return CSW's call at her earliest convenience.    Follow Up Plan:  Additional outreach attempts will be made to offer the patient care coordination information and services.   Encounter Outcome:  No Answer.   Care Coordination Interventions Activated:  No    Care Coordination Interventions:  No, not indicated.    Nat Christen, BSW, MSW, LCSW  Licensed Education officer, environmental [facility]  Mailing [place] [address], [place], [place] [address] Physical Address-[address], [place], [place] [address] Toll Free Main # [phone] Fax # [phone] Cell # [phone] [email or web address]

## 2021-10-09 ENCOUNTER — Other Ambulatory Visit: Payer: Self-pay | Admitting: Nurse Practitioner

## 2021-10-09 DIAGNOSIS — F339 Major depressive disorder, recurrent, unspecified: Secondary | ICD-10-CM

## 2021-10-13 ENCOUNTER — Telehealth: Payer: Self-pay | Admitting: Nurse Practitioner

## 2021-10-13 ENCOUNTER — Other Ambulatory Visit: Payer: Self-pay | Admitting: Nurse Practitioner

## 2021-10-13 ENCOUNTER — Telehealth: Payer: Medicare HMO | Admitting: Physician Assistant

## 2021-10-13 DIAGNOSIS — R059 Cough, unspecified: Secondary | ICD-10-CM

## 2021-10-13 DIAGNOSIS — G5603 Carpal tunnel syndrome, bilateral upper limbs: Secondary | ICD-10-CM

## 2021-10-13 MED ORDER — DOXYCYCLINE HYCLATE 100 MG PO CAPS: 100.0000 mg | ORAL_CAPSULE | Freq: Two times a day (BID) | ORAL | 0 refills | Status: AC

## 2021-10-13 MED ORDER — BENZONATATE 100 MG PO CAPS: 100.0000 mg | ORAL_CAPSULE | Freq: Three times a day (TID) | ORAL | 0 refills | Status: AC

## 2021-10-13 NOTE — Telephone Encounter (Signed)
Flonase OTC

## 2021-10-13 NOTE — Progress Notes (Signed)
We are sorry that you are not feeling well.  Here is how we plan to help!  Based on your presentation I believe you most likely have A cough due to bacteria.  When patients have a fever and a productive cough with a change in color or increased sputum production, we are concerned about bacterial bronchitis.  If left untreated it can progress to pneumonia.  If your symptoms do not improve with your treatment plan it is important that you contact your provider.   I have prescribed Doxycycline 100 mg twice a day for 7 days     In addition you may use A prescription cough medication called Tessalon Perles '100mg'$ . You may take 1-2 capsules every 8 hours as needed for your cough.  With regards to your surgery, you will need to discuss surgical clearance with your primary care provider or your surgeon.  From your responses in the eVisit questionnaire you describe inflammation in the upper respiratory tract which is causing a significant cough.  This is commonly called Bronchitis and has four common causes:   Allergies Viral Infections Acid Reflux Bacterial Infection Allergies, viruses and acid reflux are treated by controlling symptoms or eliminating the cause. An example might be a cough caused by taking certain blood pressure medications. You stop the cough by changing the medication. Another example might be a cough caused by acid reflux. Controlling the reflux helps control the cough.  USE OF BRONCHODILATOR ("RESCUE") INHALERS: There is a risk from using your bronchodilator too frequently.  The risk is that over-reliance on a medication which only relaxes the muscles surrounding the breathing tubes can reduce the effectiveness of medications prescribed to reduce swelling and congestion of the tubes themselves.  Although you feel brief relief from the bronchodilator inhaler, your asthma may actually be worsening with the tubes becoming more swollen and filled with mucus.  This can delay other crucial  treatments, such as oral steroid medications. If you need to use a bronchodilator inhaler daily, several times per day, you should discuss this with your provider.  There are probably better treatments that could be used to keep your asthma under control.     HOME CARE Only take medications as instructed by your medical team. Complete the entire course of an antibiotic. Drink plenty of fluids and get plenty of rest. Avoid close contacts especially the very young and the elderly Cover your mouth if you cough or cough into your sleeve. Always remember to wash your hands A steam or ultrasonic humidifier can help congestion.   GET HELP RIGHT AWAY IF: You develop worsening fever. You become short of breath You cough up blood. Your symptoms persist after you have completed your treatment plan MAKE SURE YOU  Understand these instructions. Will watch your condition. Will get help right away if you are not doing well or get worse.    Thank you for choosing an e-visit.  Your e-visit answers were reviewed by a board certified advanced clinical practitioner to complete your personal care plan. Depending upon the condition, your plan could have included both over the counter or prescription medications.  Please review your pharmacy choice. Make sure the pharmacy is open so you can pick up prescription now. If there is a problem, you may contact your provider through [employer] and have the prescription routed to another pharmacy.  Your safety is important to [place]. If you have drug allergies check your prescription carefully.   For the next 24 hours you can  use MyChart to ask questions about today's visit, request a non-urgent call back, or ask for a work or school excuse. You will get an email in the next two days asking about your experience. I hope that your e-visit has been valuable and will speed your recovery.   Approximately 5 minutes was spent documenting and reviewing patient's  chart.

## 2021-10-13 NOTE — Telephone Encounter (Signed)
Patient states that she has surgery this Friday on her hand and was rx'd meds today but she doesn't feel like she needs them. She was prescribed an antibiotic and cough med from an evisit. Patient states that she doesn't have a fever, no SOB, no congestion, just a hacky cough. She thinks its just allergies. I advised to take allergy medication and flonase. Patient agreed and verbalized understanding

## 2021-10-13 NOTE — Telephone Encounter (Signed)
Attempted to call pt , left vm

## 2021-10-15 ENCOUNTER — Telehealth: Payer: Self-pay | Admitting: Nurse Practitioner

## 2021-10-15 NOTE — Telephone Encounter (Signed)
Returned call and patient states she has already taken care of this.

## 2021-10-16 ENCOUNTER — Telehealth: Payer: Medicare HMO

## 2021-10-22 ENCOUNTER — Ambulatory Visit (INDEPENDENT_AMBULATORY_CARE_PROVIDER_SITE_OTHER): Payer: Medicare HMO | Admitting: Pharmacist

## 2021-10-22 DIAGNOSIS — G720 Drug-induced myopathy: Secondary | ICD-10-CM

## 2021-10-22 DIAGNOSIS — I10 Essential (primary) hypertension: Secondary | ICD-10-CM

## 2021-10-22 DIAGNOSIS — E1165 Type 2 diabetes mellitus with hyperglycemia: Secondary | ICD-10-CM

## 2021-10-22 MED ORDER — SEMAGLUTIDE(0.25 OR 0.5MG/DOS) 2 MG/3ML ~~LOC~~ SOPN: 0.2500 mg | PEN_INJECTOR | SUBCUTANEOUS | 5 refills | Status: DC

## 2021-10-22 NOTE — Patient Instructions (Addendum)
Visit Information  Following are the goals we discussed today:  Current Barriers:  Unable to independently afford treatment regimen Unable to achieve control of T2DM  Suboptimal therapeutic regimen for T2DM  Pharmacist Clinical Goal(s):  patient will verbalize ability to afford treatment regimen achieve control of T2DM as evidenced by GOAL A1C, IMPROVED TIME IN TARGET RANGE through collaboration with PharmD and provider.   Interventions: 1:1 collaboration with Chevis Pretty, FNP regarding development and update of comprehensive plan of care as evidenced by provider attestation and co-signature Inter-disciplinary care team collaboration (see longitudinal plan of care) Comprehensive medication review performed; medication list updated in electronic medical record  Diabetes: Goal on track: NO. 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  Current treatment: toujeo, novolin R, farxiga, metformin Need to optimize regimen START Ozempic 0.'25mg'$  sq weekly after surgery on 9/6 Will need to do PAP--awaiting patient forms, patient can't afford Denies personal and family history of Medullary thyroid cancer (MTC) Current glucose readings: fasting glucose: 200s, post prandial glucose: >200 Discussed meal planning options and Plate method for healthy eating Avoid/limit sugary drinks and desserts Incorporate balanced protein, non starchy veggies, 1 serving of carbohydrate with each meal Increase water intake Increase physical activity as able Current exercise: n/a; encouraged Recommended add GLP1-new start ozempic Assessed patient finances. Awaiting patient finances to enroll in patient assistance   Patient Goals/Self-Care Activities patient will:  - take medications as prescribed as evidenced by patient report and record review check glucose using CGM, document, and provide at future appointments collaborate with provider on medication access solutions target a minimum of 150  minutes of moderate intensity exercise weekly engage in dietary modifications by  FOLLOWING A HEART HEALTHY DIET/HEALTHY PLATE METHOD (will also help in lowering cholesterol)    Plan: Telephone follow up appointment with care management team member scheduled for:  3 weeks  Signature Regina Eck, PharmD, BCPS Clinical Pharmacist, [place]  II Phone [phone]   Please call the care guide team at [phone] if you need to cancel or reschedule your appointment.   The patient verbalized understanding of instructions, educational materials, and care plan provided today and DECLINED offer to receive copy of patient instructions, educational materials, and care plan.

## 2021-10-22 NOTE — Progress Notes (Signed)
Chronic Care Management Pharmacy Note  10/22/2021 Name:  Alexandra Hunt MRN:  [id number] DOB:  February 03, 1946  Summary:  Diabetes: Goal on track: NO. 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  Current treatment: toujeo, novolin R, farxiga, metformin Need to optimize regimen START Ozempic 0.$RemoveBeforeD'25mg'DjDkBspXGCIEWJ$  sq weekly after surgery on 9/6 Will need to do PAP--awaiting patient forms, patient can't afford Denies personal and family history of Medullary thyroid cancer (MTC) Current glucose readings: fasting glucose: 200s, post prandial glucose: >200 Discussed meal planning options and Plate method for healthy eating Avoid/limit sugary drinks and desserts Incorporate balanced protein, non starchy veggies, 1 serving of carbohydrate with each meal Increase water intake Increase physical activity as able Current exercise: n/a; encouraged Recommended add GLP1-new start ozempic Assessed patient finances. Awaiting patient finances to enroll in patient assistance  Subjective: Alexandra Hunt is an 76 y.o. year old female who is a primary patient of Chevis Pretty, [place].  The CCM team was consulted for assistance with disease management and care coordination needs.    Engaged with patient by telephone for initial visit in response to provider referral for pharmacy case management and/or care coordination services.   Consent to Services:  The patient was given information about Chronic Care Management services, agreed to services, and gave verbal consent prior to initiation of services.  Please see initial visit note for detailed documentation.   Patient Care Team: Chevis Pretty, FNP as PCP - General (Family Medicine) Maisie Fus, MD (Inactive) as Consulting Physician (Obstetrics and Gynecology) Michael Boston, MD as Consulting Physician (General Surgery) Gatha Mayer, MD as Consulting Physician (Gastroenterology) Lavera Guise, [facility] (Pharmacist) Gaynelle Arabian, MD as Consulting  Physician (Orthopedic Surgery) Monna Fam, MD as Consulting Physician (Ophthalmology) Katha Cabal, LCSW as [place] [facility] (Licensed Clinical Social Worker)  Objective:  Lab Results  Component Value Date   CREATININE 1.04 (H) 09/09/2021   CREATININE 1.20 (H) 05/08/2021   CREATININE 1.33 (H) 03/03/2021    Lab Results  Component Value Date   HGBA1C 9.0 (H) 09/09/2021   Last diabetic Eye exam:  Lab Results  Component Value Date/Time   HMDIABEYEEXA Retinopathy (A) 06/15/2021 12:00 AM    Last diabetic Foot exam: No results found for: "HMDIABFOOTEX"      Component Value Date/Time   CHOL 158 09/09/2021 1548   CHOL 145 08/30/2012 1422   TRIG 341 (H) 09/09/2021 1548   TRIG 314 (H) 10/05/2013 1526   TRIG 184 (H) 08/30/2012 1422   HDL 26 (L) 09/09/2021 1548   HDL 30 (L) 10/05/2013 1526   HDL 26 (L) 08/30/2012 1422   CHOLHDL 6.1 (H) 09/09/2021 1548   LDLCALC 77 09/09/2021 1548   [place] 68 10/05/2013 1526   LDLCALC 82 08/30/2012 1422       Latest Ref Rng & Units 09/09/2021    3:48 PM 05/08/2021   10:08 AM 03/03/2021    8:13 AM  Hepatic Function  Total Protein 6.0 - 8.5 g/dL 6.9  6.5  6.7   Albumin 3.8 - 4.8 g/dL 4.2  3.9  4.2   AST 0 - 40 IU/L $Remov'17  17  17   'Spdphq$ ALT 0 - 32 IU/L $Remov'16  11  9   'HmYBEE$ Alk Phosphatase 44 - 121 IU/L 81  62  75   Total Bilirubin 0.0 - 1.2 mg/dL <0.2  <0.2  <0.2     No results found for: "TSH", "FREET4"     Latest Ref Rng & Units 09/09/2021  3:48 PM 05/08/2021   10:08 AM 03/03/2021    8:13 AM  CBC  WBC 3.4 - 10.8 x10E3/uL 10.5  12.4  12.4   Hemoglobin 11.1 - 15.9 g/dL 11.5  11.7  11.4   Hematocrit 34.0 - 46.6 % 36.9  39.5  36.7   Platelets 150 - 450 x10E3/uL 346  375  379     Lab Results  Component Value Date/Time   VD25OH 33.8 03/04/2020 03:10 PM   VD25OH 17.1 (L) 07/09/2015 04:23 PM    Clinical ASCVD: No  The 10-year ASCVD risk score (Arnett DK, et al., 2019) is: 32.8%   Values used to calculate the score:      Age: 34 years     Sex: Female     Is Non-Hispanic African American: No     Diabetic: Yes     Tobacco smoker: No     Systolic Blood Pressure: 623 mmHg     Is BP treated: Yes     HDL Cholesterol: 26 mg/dL     Total Cholesterol: 158 mg/dL    Other: (CHADS2VASc if Afib, PHQ9 if depression, MMRC or CAT for COPD, ACT, DEXA)  Social History   Tobacco Use  Smoking Status Never   Passive exposure: Never  Smokeless Tobacco Never   BP Readings from Last 3 Encounters:  09/09/21 127/70  05/21/21 126/68  05/19/21 (!) 147/51   Pulse Readings from Last 3 Encounters:  09/09/21 83  05/21/21 94  05/19/21 80   Wt Readings from Last 3 Encounters:  09/09/21 203 lb (92.1 kg)  05/21/21 210 lb (95.3 kg)  05/18/21 199 lb 15.3 oz (90.7 kg)    Assessment: Review of patient past medical history, allergies, medications, health status, including review of consultants reports, laboratory and other test data, was performed as part of comprehensive evaluation and provision of chronic care management services.   SDOH:  (Social Determinants of Health) assessments and interventions performed:    CCM Care Plan  Allergies  Allergen Reactions   Penicillins     SOB,rash   Ivp Dye [Iodinated Contrast Media]     Burning at IV site   Statins     myalgias   Codeine     nauseated   Latex     rash   Sulfa Antibiotics     nauseated    Medications Reviewed Today     Reviewed by Lavera Guise, [facility] (Pharmacist) on 10/22/21 at 1138  Med List Status: <None>   Medication Order Taking? Sig Documenting Provider Last Dose Status Informant  albuterol (VENTOLIN HFA) 108 (90 Base) MCG/ACT inhaler [id number] No Inhale 2 puffs into the lungs every 6 (six) hours as needed for wheezing or shortness of breath. Hassell Done Mary-Margaret, FNP Taking Active   Alcohol Swabs (B-D SINGLE USE SWABS REGULAR) PADS [id number] No TEST BS UP TO FOUR TIMES DAILY Dx E11.9 Hassell Done, Mary-Margaret, FNP Taking Active   ALPHAGAN P 0.1 %  SOLN [id number] No Apply 1 drop to eye 2 (two) times daily. [provider] Taking Active   aspirin 81 MG tablet [id number] No Take 81 mg by mouth daily. [provider] Taking Active   Blood Glucose Calibration (TRUE METRIX LEVEL 1) Low SOLN [id number] No Use with glucose machine Dx E11.9, Chevis Pretty, FNP Taking Active   Blood Glucose Monitoring Suppl (TRUE METRIX METER) w/Device KIT [id number] No TEST BS UP TO FOUR TIMES DAILY Dx E11.9 Chevis Pretty, FNP Taking Active   Continuous Blood Gluc Receiver (FREESTYLE Archie  2 READER) DEVI [id number] No Use to test blood sugar 4-6 times daily as directed DX: E11.9 Chevis Pretty, FNP Taking Active   Continuous Blood Gluc Sensor (FREESTYLE LIBRE 14 DAY SENSOR) [place] [id number] No Change every 14 days as directed Dx E11.9 Chevis Pretty, FNP Taking Active   [facility] LUBRICANT EYE DROPS 0.6 % SOLN [id number] No Apply 1 drop to eye 2 (two) times daily. [provider] Taking Active   dapagliflozin propanediol (FARXIGA) 5 MG TABS tablet [id number] No Take 1 tablet (5 mg total) by mouth daily. Hassell Done, Mary-Margaret, FNP Taking Active   diclofenac (VOLTAREN) 75 MG EC tablet [id number] No TAKE 1 TABLET BY MOUTH TWICE A DAY AS NEEDED Hassell Done, Mary-Margaret, FNP Taking Active   dorzolamide (TRUSOPT) 2 % ophthalmic solution [id number] No Place 2 drops into both eyes 2 (two) times daily. [provider] Taking Active   fenofibrate 160 MG tablet [id number]  Take 1 tablet (160 mg total) by mouth daily. Hassell Done, Mary-Margaret, FNP  Active   fluticasone [facility]) 50 MCG/ACT nasal spray [id number]  SPRAY 2 SPRAYS INTO EACH NOSTRIL EVERY DAY Hassell Done, Mary-Margaret, FNP  Active   furosemide (LASIX) 20 MG tablet [id number]  TAKE 1 TABLET BY MOUTH EVERY DAY Hassell Done, Mary-Margaret, FNP  Active   gabapentin (NEURONTIN) 100 MG capsule [id number]  Take 1 capsule ($RemoveBe'100mg'rnKijlWIR$ ) by mouth in the morning, 1 capsule ($RemoveBe'100mg'LwEWJQmvE$ ) in the afternoon, & 2  capsules ($RemoveBe'200mg'NSvRvZTde$ ) in the evening Hassell Done, Mary-Margaret, FNP  Active   glucose blood (TRUE METRIX BLOOD GLUCOSE TEST) test strip [id number] No TEST BS UP TO FOUR TIMES DAILY Dx E11.9 Chevis Pretty, FNP Taking Active   ibuprofen (ADVIL) 800 MG tablet [id number]  TAKE 1 TABLET BY MOUTH EVERY 8 HOURS AS NEEDED Hassell Done, Mary-Margaret, FNP  Active   insulin glargine, 1 Unit Dial, (TOUJEO SOLOSTAR) 300 UNIT/ML Solostar Pen [id number] No Inject 90 Units into the skin in the morning and at bedtime. Chevis Pretty, FNP Taking Active   Insulin Pen Needle (B-D UF III MINI PEN NEEDLES) 31G X 5 MM MISC [id number] No USE WITH SLIDING SCALE HUMALOG 4 TIMES DAILY DX: E11.9 Chevis Pretty, FNP Taking Active   insulin regular (NOVOLIN R) 100 units/mL injection [id number] No INJECT 22UNITS 4 TIMES A DAY PER SLIDING SCALE Hassell Done, Mary-Margaret, FNP Taking Active   Insulin Syringe-Needle U-100 (TRUEPLUS INSULIN SYRINGE) 31G X 5/16" 1 ML MISC [id number] No Use daily with Lantus & Humalog insulin sliding scales as instructed Dx E11.9 Chevis Pretty, FNP Taking Active   Lancet Devices (TRUEDRAW LANCING DEVICE) MISC [id number] No TEST BS UP TO FOUR TIMES DAILY Dx E11.9 Chevis Pretty, FNP Taking Active   lidocaine (LIDODERM) 5 % [id number] No Place 1 patch onto the skin daily. Remove & Discard patch within 12 hours or as directed by MD Evalee Jefferson, PA-C Taking Active   lisinopril (ZESTRIL) 20 MG tablet [id number]  Take 1 tablet (20 mg total) by mouth daily. Hassell Done, Mary-Margaret, FNP  Active   LORazepam (ATIVAN) 1 MG tablet [id number]  Take 1 tablet (1 mg total) by mouth 3 (three) times daily. Hassell Done, Mary-Margaret, FNP  Active   magic mouthwash SOLN [id number] No Take 5 mLs by mouth 4 (four) times daily. Hassell Done Mary-Margaret, FNP Taking Active   meclizine (ANTIVERT) 25 MG tablet [id number] No Take 1 tablet (25 mg total) by mouth 3 (three) times daily as needed for dizziness. Hassell Done, Mary-Margaret, FNP  Taking Active   meloxicam (MOBIC) 15 MG tablet [id number] No Take 15 mg by mouth daily. [provider] Taking Active   mupirocin cream (BACTROBAN) 2 % [id number] No Apply 1 application topically 2 (two) times daily. Chevis Pretty, FNP Taking Active   ondansetron [facility]) 4 MG tablet [id number] No  [provider] Taking Active            Med Note Rolena Infante   Fri Dec 20, 2014  4:36 PM) Received from: External Pharmacy  sertraline (ZOLOFT) 100 MG tablet [id number]  Take 2 tablets (200 mg total) by mouth daily. Hassell Done, Mary-Margaret, FNP  Active   silver sulfADIAZINE (SILVADENE) 1 % cream [id number] No Apply 1 application topically 2 (two) times daily. [provider] Taking Active   tolterodine (DETROL LA) 4 MG 24 hr capsule [id number]  Take 1 capsule (4 mg total) by mouth daily. Hassell Done, Mary-Margaret, FNP  Active   traMADol (ULTRAM) 50 MG tablet [id number] No Take 50 mg by mouth. [provider] Taking Active            Med Note [place], AMY E   Fri Mar 06, 2021  9:56 AM) Taking once every 4-6hours prn  TRUEplus Lancets 33G MISC [id number] No TEST BS UP TO FOUR TIMES DAILY Dx E11.9 Chevis Pretty, FNP Taking Active   Vitamin D, Ergocalciferol, (DRISDOL) 1.25 MG (50000 UNIT) CAPS capsule [id number] No TAKE 1 CAPSULE EVERY 7 DAYS Chevis Pretty, FNP Taking Active             Patient Active Problem List   Diagnosis Date Noted   Stress incontinence of urine 03/03/2021   Uncontrolled type 2 diabetes mellitus with hyperglycemia ([place]) 11/28/2020   Bilateral carpal tunnel syndrome 11/24/2020   Drug-induced myopathy 09/04/2019   Nasal turbinate hypertrophy 05/18/2019   Deviated septum 04/27/2019   Rhinitis, chronic 04/27/2019   Hx of adenomatous colonic polyps 03/20/2018   Osteopenia 07/09/2015   Low serum vitamin D 07/09/2015   BMI 32.0-32.9,adult 12/20/2014   Diabetic polyneuropathy associated with type 2 diabetes mellitus ([place])  12/20/2014   Depression 10/11/2013   GAD (generalized anxiety disorder) 10/11/2013   CANCER, ENDOMETRIUM 05/25/2008   Hyperlipemia 05/25/2008   Essential hypertension, malignant 05/25/2008   Sleep apnea 05/25/2008    Immunization History  Administered Date(s) Administered   Fluad Quad(high Dose 65+) 03/04/2020, 11/28/2020   Influenza, High Dose Seasonal PF 12/27/2016, 01/27/2018   Influenza,inj,Quad PF,6+ Mos 02/07/2013, 12/20/2014, 12/02/2015   Influenza,inj,quad, With Preservative 12/30/2016   Influenza-Unspecified 05/30/2014   Pneumococcal Conjugate-13 12/20/2014   Pneumococcal Polysaccharide-23 09/29/2012   Tdap 03/03/2012    Conditions to be addressed/monitored: DMII and CKD Stage 3a  Care Plan : PHARMD MEDICATION MANAGEMENT  Updates made by Lavera Guise, RPH since 10/22/2021 12:00 AM     Problem: DISEASE PROGRESSION PREVENTION      Long-Range Goal: T2DM PHARMD GOAL   Recent Progress: Not on track  Priority: High  Note:   Current Barriers:  Unable to independently afford treatment regimen Unable to achieve control of T2DM  Suboptimal therapeutic regimen for T2DM  Pharmacist Clinical Goal(s):  patient will verbalize ability to afford treatment regimen achieve control of T2DM as evidenced by GOAL A1C, IMPROVED TIME IN TARGET RANGE  through collaboration with PharmD and provider.   Interventions: 1:1 collaboration with Chevis Pretty, FNP regarding development and update of comprehensive plan of care as evidenced by provider attestation and co-signature Inter-disciplinary care team collaboration (see longitudinal plan of care) Comprehensive medication review performed; medication list updated in electronic medical record  Diabetes: Goal on track: NO. Uncontrolled T2DM-A1C  8.2%-->9%, GFR 56-CKD 3a  Current treatment: toujeo, novolin R, farxiga, metformin Need to optimize regimen START Ozempic 0.$RemoveBeforeD'25mg'NskEyfCgLVrrMc$  sq weekly after surgery on 9/6 Will need to do  PAP--awaiting patient forms, patient can't afford Denies personal and family history of Medullary thyroid cancer (MTC) Current glucose readings: fasting glucose: 200s, post prandial glucose: >200 Discussed meal planning options and Plate method for healthy eating Avoid/limit sugary drinks and desserts Incorporate balanced protein, non starchy veggies, 1 serving of carbohydrate with each meal Increase water intake Increase physical activity as able Current exercise: n/a; encouraged Recommended add GLP1-new start ozempic Assessed patient finances. Awaiting patient finances to enroll in patient assistance   Patient Goals/Self-Care Activities patient will:  - take medications as prescribed as evidenced by patient report and record review check glucose using CGM, document, and provide at future appointments collaborate with provider on medication access solutions target a minimum of 150 minutes of moderate intensity exercise weekly engage in dietary modifications by    FOLLOWING A HEART HEALTHY DIET/HEALTHY PLATE METHOD (will also help in lowering cholesterol)       Medication Assistance: Application for GEEATVVL/RTJ4/WZLYTSS pending until patient brings me her forms. Anticipated assistance start date TBD.  See plan of care for additional detail.  Follow Up:  Patient agrees to Care Plan and Follow-up.  Plan: Telephone follow up appointment with care management team member scheduled for:  10/2021    Regina Eck, PharmD, BCPS Clinical Pharmacist, [place]  II Phone [phone]

## 2021-10-29 DIAGNOSIS — I10 Essential (primary) hypertension: Secondary | ICD-10-CM

## 2021-10-29 DIAGNOSIS — E1165 Type 2 diabetes mellitus with hyperglycemia: Secondary | ICD-10-CM

## 2021-11-03 DIAGNOSIS — M65331 Trigger finger, right middle finger: Secondary | ICD-10-CM | POA: Diagnosis not present

## 2021-11-03 DIAGNOSIS — G5621 Lesion of ulnar nerve, right upper limb: Secondary | ICD-10-CM | POA: Diagnosis not present

## 2021-11-03 DIAGNOSIS — G5601 Carpal tunnel syndrome, right upper limb: Secondary | ICD-10-CM | POA: Diagnosis not present

## 2021-11-17 ENCOUNTER — Ambulatory Visit: Payer: Medicare HMO | Admitting: Pharmacist

## 2021-11-17 DIAGNOSIS — M25641 Stiffness of right hand, not elsewhere classified: Secondary | ICD-10-CM | POA: Diagnosis not present

## 2021-12-08 ENCOUNTER — Ambulatory Visit (INDEPENDENT_AMBULATORY_CARE_PROVIDER_SITE_OTHER): Payer: Medicare HMO | Admitting: Pharmacist

## 2021-12-08 DIAGNOSIS — G720 Drug-induced myopathy: Secondary | ICD-10-CM

## 2021-12-08 DIAGNOSIS — E1165 Type 2 diabetes mellitus with hyperglycemia: Secondary | ICD-10-CM | POA: Diagnosis not present

## 2021-12-15 ENCOUNTER — Encounter: Payer: Self-pay | Admitting: Nurse Practitioner

## 2021-12-15 ENCOUNTER — Ambulatory Visit (INDEPENDENT_AMBULATORY_CARE_PROVIDER_SITE_OTHER): Payer: Medicare HMO | Admitting: Nurse Practitioner

## 2021-12-15 DIAGNOSIS — J069 Acute upper respiratory infection, unspecified: Secondary | ICD-10-CM | POA: Diagnosis not present

## 2021-12-15 MED ORDER — PROMETHAZINE-DM 6.25-15 MG/5ML PO SYRP: 5.0000 mL | ORAL_SOLUTION | Freq: Four times a day (QID) | ORAL | 0 refills | Status: DC | PRN

## 2021-12-15 MED ORDER — AZITHROMYCIN 250 MG PO TABS: ORAL_TABLET | ORAL | 0 refills | Status: DC

## 2021-12-15 NOTE — Progress Notes (Signed)
Virtual Visit  Note Due to COVID-19 pandemic this visit was conducted virtually. This visit type was conducted due to national recommendations for restrictions regarding the COVID-19 Pandemic (e.g. social distancing, sheltering in place) in an effort to limit this patient's exposure and mitigate transmission in our community. All issues noted in this document were discussed and addressed.  A physical exam was not performed with this format.  I connected with Alexandra Hunt on 12/15/21 at 2:00 by telephone and verified that I am speaking with the correct person using two identifiers. Alexandra Hunt is currently located at home and no one is currently with her during visit. The provider, Mary-Margaret Hassell Done, FNP is located in their office at time of visit.  I discussed the limitations, risks, security and privacy concerns of performing an evaluation and management service by telephone and the availability of in person appointments. I also discussed with the patient that there may be a patient responsible charge related to this service. The patient expressed understanding and agreed to proceed.   History and Present Illness:  Cough This is a new problem. The current episode started in the past 7 days. The problem has been gradually worsening. The problem occurs constantly. The cough is Productive of sputum. Associated symptoms include a fever, nasal congestion, postnasal drip and a sore throat. Associated symptoms comments: Temp 100.1. Nothing aggravates the symptoms. She has tried OTC cough suppressant for the symptoms. The treatment provided mild relief.      Review of Systems  Constitutional:  Positive for fever.  HENT:  Positive for postnasal drip and sore throat.   Respiratory:  Positive for cough.      Observations/Objective: Alert and oriented- answers all questions appropriately No distress [place] voice Deep dry cough throughout visit  Assessment and Plan: Alexandra Hunt in today  with chief complaint of Cough   1. URI with cough and congestion 1. Take meds as prescribed 2. Use a cool mist humidifier especially during the winter months and when heat has been humid. 3. Use saline nose sprays frequently 4. Saline irrigations of the nose can be very helpful if done frequently.  * 4X daily for 1 week*  * Use of a nettie pot can be helpful with this. Follow directions with this* 5. Drink plenty of fluids 6. Keep thermostat turn down low 7.For any cough or congestion- promethazine DM 8. For fever or aces or pains- take tylenol or ibuprofen appropriate for age and weight.  * for fevers greater than 101 orally you may alternate ibuprofen and tylenol every  3 hours.    - azithromycin (ZITHROMAX Z-PAK) 250 MG tablet; As directed  Dispense: 6 tablet; Refill: 0 - promethazine-dextromethorphan (PROMETHAZINE-DM) 6.25-15 MG/5ML syrup; Take 5 mLs by mouth 4 (four) times daily as needed for cough.  Dispense: 118 mL; Refill: 0   Patient will call back if home covid test is positive  Follow Up Instructions: prn    I discussed the assessment and treatment plan with the patient. The patient was provided an opportunity to ask questions and all were answered. The patient agreed with the plan and demonstrated an understanding of the instructions.   The patient was advised to call back or seek an in-person evaluation if the symptoms worsen or if the condition fails to improve as anticipated.  The above assessment and management plan was discussed with the patient. The patient verbalized understanding of and has agreed to the management plan. Patient is aware to call the  clinic if symptoms persist or worsen. Patient is aware when to return to the clinic for a follow-up visit. Patient educated on when it is appropriate to go to the emergency department.   Time call ended:  2:12  I provided 12 minutes of  non face-to-face time during this encounter.    Mary-Margaret Hassell Done,  FNP

## 2021-12-15 NOTE — Patient Instructions (Signed)

## 2021-12-17 DIAGNOSIS — G5760 Lesion of plantar nerve, unspecified lower limb: Secondary | ICD-10-CM | POA: Diagnosis not present

## 2021-12-17 DIAGNOSIS — G5761 Lesion of plantar nerve, right lower limb: Secondary | ICD-10-CM | POA: Diagnosis not present

## 2021-12-17 DIAGNOSIS — M79673 Pain in unspecified foot: Secondary | ICD-10-CM | POA: Diagnosis not present

## 2021-12-17 DIAGNOSIS — G5762 Lesion of plantar nerve, left lower limb: Secondary | ICD-10-CM | POA: Diagnosis not present

## 2021-12-22 ENCOUNTER — Other Ambulatory Visit: Payer: Self-pay | Admitting: Nurse Practitioner

## 2021-12-22 DIAGNOSIS — G5603 Carpal tunnel syndrome, bilateral upper limbs: Secondary | ICD-10-CM

## 2021-12-22 DIAGNOSIS — I10 Essential (primary) hypertension: Secondary | ICD-10-CM

## 2021-12-22 DIAGNOSIS — E113212 Type 2 diabetes mellitus with mild nonproliferative diabetic retinopathy with macular edema, left eye: Secondary | ICD-10-CM | POA: Diagnosis not present

## 2021-12-22 DIAGNOSIS — H2513 Age-related nuclear cataract, bilateral: Secondary | ICD-10-CM | POA: Diagnosis not present

## 2021-12-22 DIAGNOSIS — H35372 Puckering of macula, left eye: Secondary | ICD-10-CM | POA: Diagnosis not present

## 2021-12-22 DIAGNOSIS — E113291 Type 2 diabetes mellitus with mild nonproliferative diabetic retinopathy without macular edema, right eye: Secondary | ICD-10-CM | POA: Diagnosis not present

## 2021-12-22 NOTE — Progress Notes (Signed)
    12/08/2021 Name: Alexandra Hunt MRN: [id number] DOB: November 14, 1945   S:  76 yoF Presents for diabetes evaluation, education, and management.  Patient is status post recent ortho surgery and is recovering well.  Her A1c remains uncontrolled and we will work to optimize her medication regimen. Insurance coverage/medication affordability: [employer] medicare  Patient reports adherence with medications. Current diabetes medications include:  toujeo, novolin R, farxiga, metformin Current hypertension medications include: lisinopril Goal 130/80 Current hyperlipidemia medications include: n/a  STATIN ALLERGY/INTOLERANCE DOCUMENTED IN EMR yes G72    Patient denies hypoglycemic events.   Patient reported dietary habits: Eats 3 meals/day Discussed meal planning options and Plate method for healthy eating Avoid sugary drinks and desserts Incorporate balanced protein, non starchy veggies, 1 serving of carbohydrate with each meal Increase water intake Increase physical activity as able  Patient-reported exercise habits: n/a, recent surgery   O:  Lab Results  Component Value Date   HGBA1C 9.0 (H) 09/09/2021   Lipid Panel     Component Value Date/Time   CHOL 158 09/09/2021 1548   CHOL 145 08/30/2012 1422   TRIG 341 (H) 09/09/2021 1548   TRIG 314 (H) 10/05/2013 1526   TRIG 184 (H) 08/30/2012 1422   HDL 26 (L) 09/09/2021 1548   HDL 30 (L) 10/05/2013 1526   HDL 26 (L) 08/30/2012 1422   CHOLHDL 6.1 (H) 09/09/2021 1548   LDLCALC 77 09/09/2021 1548   LDLCALC 68 10/05/2013 1526   LDLCALC 82 08/30/2012 1422     Home fasting blood sugars: n/a  2 hour post-meal/random blood sugars: n/a.    Clinical Atherosclerotic Cardiovascular Disease (ASCVD): No   The 10-year ASCVD risk score (Arnett DK, et al., 2019) is: 32.8%   Values used to calculate the score:     Age: 76 years     Sex: Female     Is Non-Hispanic African American: No     Diabetic: Yes     Tobacco smoker: No     Systolic  Blood Pressure: 127 mmHg     Is BP treated: Yes     HDL Cholesterol: 26 mg/dL     Total Cholesterol: 158 mg/dL    A/P:   Diabetes: Goal on track: NO. 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  Current treatment: toujeo, novolin R, farxiga, metformin Need to optimize regimen START Ozempic 0.'25mg'$  sq weekly  Will need to do PAP--awaiting patient forms, patient can't afford Sample given today Denies personal and family history of Medullary thyroid cancer (MTC) Current glucose readings: fasting glucose: 200s, post prandial glucose: >200 Discussed meal planning options and Plate method for healthy eating Avoid/limit sugary drinks and desserts Incorporate balanced protein, non starchy veggies, 1 serving of carbohydrate with each meal Increase water intake Increase physical activity as able Current exercise: n/a; encouraged Recommended add GLP1-new start ozempic Assessed patient finances. Awaiting patient finances to enroll in patient assistance  -Extensively discussed pathophysiology of diabetes, recommended lifestyle interventions, dietary effects on blood sugar control  -Counseled on s/sx of and management of hypoglycemia  -Next A1C anticipated 3 months.    Written patient instructions provided.  Total time in face to face counseling 25 minutes.    Regina Eck, PharmD, BCPS Clinical Pharmacist, [place]  II Phone [phone]

## 2021-12-28 ENCOUNTER — Ambulatory Visit (INDEPENDENT_AMBULATORY_CARE_PROVIDER_SITE_OTHER): Payer: Medicare HMO | Admitting: Nurse Practitioner

## 2021-12-28 ENCOUNTER — Encounter: Payer: Self-pay | Admitting: Nurse Practitioner

## 2021-12-28 VITALS — BP 113/63 | HR 100 | Temp 97.9°F | Resp 20 | Ht 64.0 in | Wt 199.0 lb

## 2021-12-28 DIAGNOSIS — E782 Mixed hyperlipidemia: Secondary | ICD-10-CM | POA: Diagnosis not present

## 2021-12-28 DIAGNOSIS — I10 Essential (primary) hypertension: Secondary | ICD-10-CM

## 2021-12-28 DIAGNOSIS — N393 Stress incontinence (female) (male): Secondary | ICD-10-CM

## 2021-12-28 DIAGNOSIS — Z6832 Body mass index (BMI) 32.0-32.9, adult: Secondary | ICD-10-CM

## 2021-12-28 DIAGNOSIS — E1165 Type 2 diabetes mellitus with hyperglycemia: Secondary | ICD-10-CM

## 2021-12-28 DIAGNOSIS — M8588 Other specified disorders of bone density and structure, other site: Secondary | ICD-10-CM | POA: Diagnosis not present

## 2021-12-28 DIAGNOSIS — E1142 Type 2 diabetes mellitus with diabetic polyneuropathy: Secondary | ICD-10-CM | POA: Diagnosis not present

## 2021-12-28 DIAGNOSIS — Z23 Encounter for immunization: Secondary | ICD-10-CM

## 2021-12-28 DIAGNOSIS — F339 Major depressive disorder, recurrent, unspecified: Secondary | ICD-10-CM

## 2021-12-28 DIAGNOSIS — G4733 Obstructive sleep apnea (adult) (pediatric): Secondary | ICD-10-CM | POA: Diagnosis not present

## 2021-12-28 DIAGNOSIS — F411 Generalized anxiety disorder: Secondary | ICD-10-CM

## 2021-12-28 DIAGNOSIS — R7989 Other specified abnormal findings of blood chemistry: Secondary | ICD-10-CM

## 2021-12-28 LAB — BAYER DCA HB A1C WAIVED: HB A1C (BAYER DCA - WAIVED): 7.8 % — ABNORMAL HIGH (ref 4.8–5.6)

## 2021-12-28 MED ORDER — TOLTERODINE TARTRATE ER 4 MG PO CP24: 4.0000 mg | ORAL_CAPSULE | Freq: Every day | ORAL | 1 refills | Status: DC

## 2021-12-28 MED ORDER — FUROSEMIDE 20 MG PO TABS: ORAL_TABLET | ORAL | 1 refills | Status: DC

## 2021-12-28 MED ORDER — LORAZEPAM 1 MG PO TABS: 1.0000 mg | ORAL_TABLET | Freq: Three times a day (TID) | ORAL | 2 refills | Status: DC

## 2021-12-28 MED ORDER — LISINOPRIL 20 MG PO TABS: 20.0000 mg | ORAL_TABLET | Freq: Every day | ORAL | 1 refills | Status: DC

## 2021-12-28 MED ORDER — GABAPENTIN 100 MG PO CAPS: ORAL_CAPSULE | ORAL | 1 refills | Status: DC

## 2021-12-28 MED ORDER — DAPAGLIFLOZIN PROPANEDIOL 5 MG PO TABS: 5.0000 mg | ORAL_TABLET | Freq: Every day | ORAL | 3 refills | Status: DC

## 2021-12-28 MED ORDER — SERTRALINE HCL 100 MG PO TABS: 200.0000 mg | ORAL_TABLET | Freq: Every day | ORAL | 1 refills | Status: DC

## 2021-12-28 MED ORDER — TOUJEO SOLOSTAR 300 UNIT/ML ~~LOC~~ SOPN: 90.0000 [IU] | PEN_INJECTOR | Freq: Two times a day (BID) | SUBCUTANEOUS | 5 refills | Status: DC

## 2021-12-28 MED ORDER — FENOFIBRATE 160 MG PO TABS: 160.0000 mg | ORAL_TABLET | Freq: Every day | ORAL | 1 refills | Status: DC

## 2021-12-28 NOTE — Patient Instructions (Signed)

## 2021-12-28 NOTE — Progress Notes (Addendum)
Subjective:    Patient ID: Alexandra Hunt, female    DOB: [birth year], 76 y.o.   MRN: [id number]   Chief Complaint: medical management of chronic issues     HPI:  Alexandra Hunt is a 76 y.o. who identifies as a female who was assigned female at birth.   Social history: Lives with: children and grand children Work history: disability   Comes in today for follow up of the following chronic medical issues:  1. Uncontrolled type 2 diabetes mellitus with hyperglycemia (HCC) Fasting blood sugars are all over the place. Does not watch diet at all. She saw clinical pharmacist in August and was suppose to start ozempic. She has not started yet it yet. Lab Results  Component Value Date   HGBA1C 9.0 (H) 09/09/2021     2. Essential hypertension, malignant No c/o chest pain, sob or headache. Doe snot check blood pressure at home. BP Readings from Last 3 Encounters:  09/09/21 127/70  05/21/21 126/68  05/19/21 (!) 147/51    3. Mixed hyperlipidemia Does not watch diet and does no dedicated exercise Lab Results  Component Value Date   CHOL 158 09/09/2021   HDL 26 (L) 09/09/2021   LDLCALC 77 09/09/2021   TRIG 341 (H) 09/09/2021   CHOLHDL 6.1 (H) 09/09/2021     4. Diabetic polyneuropathy associated with type 2 diabetes mellitus (HCC) Has numbness and burning in bil feet.  5. Obstructive sleep apnea syndrome Does not wear cpap at all. Sleeps sittiig up.  6. Osteopenia of lumbar spine Last dexascan was done in 2017. We will schedule a repeat.  7. Stress incontinence of urine Is on detrol LA which helps some.  8. Episode of recurrent major depressive disorder, unspecified depression episode severity [facility]) Patient stays depressed since her husband died.    10/09/21    6:21 PM May 29, 2021   10:24 AM 05/08/2021   10:14 AM  Depression screen PHQ 2/9  Decreased Interest 0 1 2  Down, Depressed, Hopeless _0 PHQ - 2 Score _1 Altered sleeping  2 2  Tired, decreased energy   1 2  Change in appetite  1 0  Feeling bad or failure about yourself   1 1  Trouble concentrating  1 0  Moving slowly or fidgety/restless  1 1  Suicidal thoughts  0 0  PHQ-9 Score  9 10  Difficult doing work/chores  Somewhat difficult Somewhat difficult    9. GAD (generalized anxiety disorder) Ison ativan TID- stays stressed even on meds.    05/08/2021   10:15 AM 03/18/2021    2:30 PM 03/03/2021    8:23 AM 06/23/2020    2:57 PM  GAD 7 : Generalized Anxiety Score  Nervous, Anxious, on Edge _2 Control/stop worrying _3 Worry too much - different things _4 Trouble relaxing _5 Restless 1 1 0 0  Easily annoyed or irritable 0 0 0 0  Afraid - awful might happen 1 1 0 3  Total GAD 7 Score _6 Anxiety Difficulty Somewhat difficult Somewhat difficult Not difficult at all Not difficult at all      10. Low serum vitamin D Is suppose to be on daily vitamin d supplement but does not take every day Last vitamin D Lab Results  Component Value Date   VD25OH 33.8 03/04/2020     11. BMI 32.0-32.9,adult  Weight is down 4 lbs Wt Readings from Last 3 Encounters:  12/28/21 199 lb (90.3 kg)  09/09/21 203 lb (92.1 kg)  05/21/21 210 lb (95.3 kg)   BMI Readings from Last 3 Encounters:  12/28/21 34.16 kg/m  09/09/21 34.84 kg/m  05/21/21 36.05 kg/m       New complaints: None today  Allergies  Allergen Reactions   Penicillins     SOB,rash   Ivp Dye [Iodinated Contrast Media]     Burning at IV site   Statins     myalgias   Codeine     nauseated   Latex     rash   Sulfa Antibiotics     nauseated   Outpatient Encounter Medications as of 12/28/2021  Medication Sig   albuterol (VENTOLIN HFA) 108 (90 Base) MCG/ACT inhaler Inhale 2 puffs into the lungs every 6 (six) hours as needed for wheezing or shortness of breath.   Alcohol Swabs (B-D SINGLE USE SWABS REGULAR) PADS TEST BS UP TO FOUR TIMES DAILY Dx E11.9   ALPHAGAN P 0.1 % SOLN Apply 1 drop to  eye 2 (two) times daily.   aspirin 81 MG tablet Take 81 mg by mouth daily.   azithromycin (ZITHROMAX Z-PAK) 250 MG tablet As directed   Blood Glucose Calibration (TRUE METRIX LEVEL 1) Low SOLN Use with glucose machine Dx E11.9,   Blood Glucose Monitoring Suppl (TRUE METRIX METER) w/Device KIT TEST BS UP TO FOUR TIMES DAILY Dx E11.9   Continuous Blood Gluc Receiver (FREESTYLE LIBRE 2 READER) DEVI Use to test blood sugar 4-6 times daily as directed DX: E11.9   Continuous Blood Gluc Sensor (FREESTYLE LIBRE 14 DAY SENSOR) MISC Change every 14 days as directed Dx E11.9   [facility] LUBRICANT EYE DROPS 0.6 % SOLN Apply 1 drop to eye 2 (two) times daily.   dapagliflozin propanediol (FARXIGA) 5 MG TABS tablet Take 1 tablet (5 mg total) by mouth daily.   diclofenac (VOLTAREN) 75 MG EC tablet TAKE 1 TABLET BY MOUTH TWICE A DAY AS NEEDED   dorzolamide (TRUSOPT) 2 % ophthalmic solution Place 2 drops into both eyes 2 (two) times daily.   fenofibrate 160 MG tablet Take 1 tablet (160 mg total) by mouth daily.   fluticasone (FLONASE) 50 MCG/ACT nasal spray SPRAY 2 SPRAYS INTO EACH NOSTRIL EVERY DAY   furosemide (LASIX) 20 MG tablet TAKE 1 TABLET BY MOUTH EVERY DAY   gabapentin (NEURONTIN) 100 MG capsule Take 1 capsule (146m) by mouth in the morning, 1 capsule (1024m in the afternoon, & 2 capsules (20067min the evening   glucose blood (TRUE METRIX BLOOD GLUCOSE TEST) test strip TEST BS UP TO FOUR TIMES DAILY Dx E11.9   ibuprofen (ADVIL) 800 MG tablet TAKE 1 TABLET BY MOUTH EVERY 8 HOURS AS NEEDED   insulin glargine, 1 Unit Dial, (TOUJEO SOLOSTAR) 300 UNIT/ML Solostar Pen Inject 90 Units into the skin in the morning and at bedtime.   Insulin Pen Needle (B-D UF III MINI PEN NEEDLES) 31G X 5 MM MISC USE WITH SLIDING SCALE HUMALOG 4 TIMES DAILY DX: E11.9   insulin regular (NOVOLIN R) 100 units/mL injection INJECT 22UNITS 4 TIMES A DAY PER SLIDING SCALE   Insulin Syringe-Needle U-100 (TRUEPLUS INSULIN SYRINGE) 31G X 5/16"  1 ML MISC Use daily with Lantus & Humalog insulin sliding scales as instructed Dx E11.9   Lancet Devices (TRUEDRAW LANCING DEVICE) MISC TEST BS UP TO FOUR TIMES DAILY Dx E11.9   lidocaine (LIDODERM) 5 %  Place 1 patch onto the skin daily. Remove & Discard patch within 12 hours or as directed by MD   lisinopril (ZESTRIL) 20 MG tablet TAKE 1 TABLET BY MOUTH EVERY DAY   LORazepam (ATIVAN) 1 MG tablet Take 1 tablet (1 mg total) by mouth 3 (three) times daily.   magic mouthwash SOLN Take 5 mLs by mouth 4 (four) times daily.   meclizine (ANTIVERT) 25 MG tablet Take 1 tablet (25 mg total) by mouth 3 (three) times daily as needed for dizziness.   meloxicam (MOBIC) 15 MG tablet Take 15 mg by mouth daily.   mupirocin cream (BACTROBAN) 2 % Apply 1 application topically 2 (two) times daily.   ondansetron (ZOFRAN) 4 MG tablet    promethazine-dextromethorphan (PROMETHAZINE-DM) 6.25-15 MG/5ML syrup Take 5 mLs by mouth 4 (four) times daily as needed for cough.   Semaglutide,0.25 or 0.5MG/DOS, 2 MG/3ML SOPN Inject 0.25 mg into the skin once a week. X4 weeks then increase to 0.31m sq weekly.  Start after surgery on 11/04/21. DX: E11.65   sertraline (ZOLOFT) 100 MG tablet Take 2 tablets (200 mg total) by mouth daily.   silver sulfADIAZINE (SILVADENE) 1 % cream Apply 1 application topically 2 (two) times daily.   tolterodine (DETROL LA) 4 MG 24 hr capsule Take 1 capsule (4 mg total) by mouth daily.   traMADol (ULTRAM) 50 MG tablet Take 50 mg by mouth.   TRUEplus Lancets 33G MISC TEST BS UP TO FOUR TIMES DAILY Dx E11.9   Vitamin D, Ergocalciferol, (DRISDOL) 1.25 MG (50000 UNIT) CAPS capsule TAKE 1 CAPSULE EVERY 7 DAYS   No facility-administered encounter medications on file as of 12/28/2021.    Past Surgical History:  Procedure Laterality Date   ABDOMINAL HYSTERECTOMY     due to uterine cancer   BREAST SURGERY     left breast biopsy/benign   TUBAL LIGATION      Family History  Problem Relation Age of Onset    Diabetes Mother    Blindness Mother        related to diabetes   Stroke Mother    Heart disease Father        MI   Heart attack Father    Heart defect Father    Breast cancer Sister    Diabetes Brother        diet controlled   Colon cancer Neg Hx    Esophageal cancer Neg Hx    Rectal cancer Neg Hx    Stomach cancer Neg Hx       Controlled substance contract: n/a     Review of Systems  Constitutional:  Negative for diaphoresis.  Eyes:  Negative for pain.  Respiratory:  Negative for shortness of breath.   Cardiovascular:  Negative for chest pain, palpitations and leg swelling.  Gastrointestinal:  Negative for abdominal pain.  Endocrine: Negative for polydipsia.  Skin:  Negative for rash.  Neurological:  Negative for dizziness, weakness and headaches.  Hematological:  Does not bruise/bleed easily.  All other systems reviewed and are negative.      Objective:   Physical Exam Vitals and nursing note reviewed.  Constitutional:      General: She is not in acute distress.    Appearance: Normal appearance. She is well-developed.  HENT:     Head: Normocephalic.     Right Ear: Tympanic membrane normal.     Left Ear: Tympanic membrane normal.     Nose: Nose normal.     Mouth/Throat:  Mouth: Mucous membranes are moist.  Eyes:     Pupils: Pupils are equal, round, and reactive to light.  Neck:     Vascular: No carotid bruit or JVD.  Cardiovascular:     Rate and Rhythm: Normal rate and regular rhythm.     Heart sounds: Normal heart sounds.  Pulmonary:     Effort: Pulmonary effort is normal. No respiratory distress.     Breath sounds: Normal breath sounds. No wheezing or rales.  Chest:     Chest wall: No tenderness.  Abdominal:     General: Bowel sounds are normal. There is no distension or abdominal bruit.     Palpations: Abdomen is soft. There is no hepatomegaly, splenomegaly, mass or pulsatile mass.     Tenderness: There is no abdominal tenderness.   Musculoskeletal:        General: Normal range of motion.     Cervical back: Normal range of motion and neck supple.  Lymphadenopathy:     Cervical: No cervical adenopathy.  Skin:    General: Skin is warm and dry.  Neurological:     Mental Status: She is alert and oriented to person, place, and time.     Deep Tendon Reflexes: Reflexes are normal and symmetric.  Psychiatric:        Behavior: Behavior normal.        Thought Content: Thought content normal.        Judgment: Judgment normal.     BP 113/63   Pulse 100   Temp 97.9 F (36.6 C) (Temporal)   Resp 20   Ht _0  (1.626 m)   Wt 199 lb (90.3 kg)   SpO2 95%   BMI 34.16 kg/m   HGBA1c 7.8%     Assessment & Plan:   Alexandra Hunt comes in today with chief complaint of Medical Management of Chronic Issues   Diagnosis and orders addressed:  1. Uncontrolled type 2 diabetes mellitus with hyperglycemia (HCC) Continue to wtahc carbs in diet Please start ozempic as prescribed - Bayer DCA Hb A1c Waived - Microalbumin / creatinine urine ratio - dapagliflozin propanediol (FARXIGA) 5 MG TABS tablet; Take 1 tablet (5 mg total) by mouth daily.  Dispense: 90 tablet; Refill: 3 - insulin glargine, 1 Unit Dial, (TOUJEO SOLOSTAR) 300 UNIT/ML Solostar Pen; Inject 90 Units into the skin in the morning and at bedtime.  Dispense: 12 mL; Refill: 5  2. Essential hypertension, malignant Low sodium diet - CMP14+EGFR - CBC with Differential/Platelet - lisinopril (ZESTRIL) 20 MG tablet; Take 1 tablet (20 mg total) by mouth daily.  Dispense: 90 tablet; Refill: 1 - furosemide (LASIX) 20 MG tablet; TAKE 1 TABLET BY MOUTH EVERY DAY  Dispense: 90 tablet; Refill: 1  3. Mixed hyperlipidemia Low fta diet - Lipid panel - fenofibrate 160 MG tablet; Take 1 tablet (160 mg total) by mouth daily.  Dispense: 90 tablet; Refill: 1  4. Diabetic polyneuropathy associated with type 2 diabetes mellitus ([place]) Do not go bare footed Keep follow up with DR.  Drake - gabapentin (NEURONTIN) 100 MG capsule; Take 1 capsule (177m) by mouth in the morning, 1 capsule (1089m in the afternoon, & 2 capsules (20074min the evening  Dispense: 360 capsule; Refill: 1  5. Obstructive sleep apnea syndrome WEAR CPAP  6. Osteopenia of lumbar spine Weight bearing exercises  7. Stress incontinence of urine Referrla to urology - tolterodine (DETROL LA) 4 MG 24 hr capsule; Take 1 capsule (4 mg total) by mouth daily.  Dispense: 90 capsule; Refill: 1 - Ambulatory referral to Urology  8. Episode of recurrent major depressive disorder, unspecified depression episode severity ([place]) Stress management - sertraline (ZOLOFT) 100 MG tablet; Take 2 tablets (200 mg total) by mouth daily.  Dispense: 180 tablet; Refill: 1  9. GAD (generalized anxiety disorder) - LORazepam (ATIVAN) 1 MG tablet; Take 1 tablet (1 mg total) by mouth 3 (three) times daily.  Dispense: 90 tablet; Refill: 2  10. Low serum vitamin D Low sodium diet  11. BMI 32.0-32.9,adult Discussed diet and exercise for person with BMI >25 Will recheck weight in 3-6 months    Labs pending Health Maintenance reviewed Diet and exercise encouraged  Follow up plan: 3 months   Mary-Margaret Hassell Done, FNP

## 2021-12-29 LAB — CMP14+EGFR
ALT: 13 IU/L (ref 0–32)
AST: 19 IU/L (ref 0–40)
Albumin/Globulin Ratio: 1.3 (ref 1.2–2.2)
Albumin: 4.4 g/dL (ref 3.8–4.8)
Alkaline Phosphatase: 73 IU/L (ref 44–121)
BUN/Creatinine Ratio: 20 (ref 12–28)
BUN: 22 mg/dL (ref 8–27)
Bilirubin Total: 0.2 mg/dL (ref 0.0–1.2)
CO2: 23 mmol/L (ref 20–29)
Calcium: 9.6 mg/dL (ref 8.7–10.3)
Chloride: 103 mmol/L (ref 96–106)
Creatinine, Ser: 1.1 mg/dL — ABNORMAL HIGH (ref 0.57–1.00)
Globulin, Total: 3.3 g/dL (ref 1.5–4.5)
Glucose: 148 mg/dL — ABNORMAL HIGH (ref 70–99)
Potassium: 4.5 mmol/L (ref 3.5–5.2)
Sodium: 144 mmol/L (ref 134–144)
Total Protein: 7.7 g/dL (ref 6.0–8.5)
eGFR: 53 mL/min/{1.73_m2} — ABNORMAL LOW (ref >59)

## 2021-12-29 LAB — CBC WITH DIFFERENTIAL/PLATELET
Basophils Absolute: 0.1 10*3/uL (ref 0.0–0.2)
Basos: 1 %
EOS (ABSOLUTE): 0.2 10*3/uL (ref 0.0–0.4)
Eos: 1 %
Hematocrit: 41.4 % (ref 34.0–46.6)
Hemoglobin: 12.6 g/dL (ref 11.1–15.9)
Immature Grans (Abs): 0 10*3/uL (ref 0.0–0.1)
Immature Granulocytes: 0 %
Lymphocytes Absolute: 4.5 10*3/uL — ABNORMAL HIGH (ref 0.7–3.1)
Lymphs: 31 %
MCH: 23.3 pg — ABNORMAL LOW (ref 26.6–33.0)
MCHC: 30.4 g/dL — ABNORMAL LOW (ref 31.5–35.7)
MCV: 77 fL — ABNORMAL LOW (ref 79–97)
Monocytes Absolute: 0.8 10*3/uL (ref 0.1–0.9)
Monocytes: 5 %
Neutrophils Absolute: 9.2 10*3/uL — ABNORMAL HIGH (ref 1.4–7.0)
Neutrophils: 62 %
Platelets: 423 10*3/uL (ref 150–450)
RBC: 5.41 x10E6/uL — ABNORMAL HIGH (ref 3.77–5.28)
RDW: 14.7 % (ref 11.7–15.4)
WBC: 14.8 10*3/uL — ABNORMAL HIGH (ref 3.4–10.8)

## 2021-12-29 LAB — LIPID PANEL
Chol/HDL Ratio: 4.4 ratio (ref 0.0–4.4)
Cholesterol, Total: 140 mg/dL (ref 100–199)
HDL: 32 mg/dL — ABNORMAL LOW (ref >39)
LDL Chol Calc (NIH): 69 mg/dL (ref 0–99)
Triglycerides: 235 mg/dL — ABNORMAL HIGH (ref 0–149)
VLDL Cholesterol Cal: 39 mg/dL (ref 5–40)

## 2021-12-29 LAB — MICROALBUMIN / CREATININE URINE RATIO
Creatinine, Urine: 107.6 mg/dL
Microalb/Creat Ratio: 12 mg/g creat (ref 0–29)
Microalbumin, Urine: 12.9 ug/mL

## 2022-01-14 DIAGNOSIS — G5790 Unspecified mononeuropathy of unspecified lower limb: Secondary | ICD-10-CM | POA: Diagnosis not present

## 2022-01-14 DIAGNOSIS — M79673 Pain in unspecified foot: Secondary | ICD-10-CM | POA: Diagnosis not present

## 2022-01-15 ENCOUNTER — Other Ambulatory Visit: Payer: Self-pay | Admitting: Nurse Practitioner

## 2022-01-15 DIAGNOSIS — F411 Generalized anxiety disorder: Secondary | ICD-10-CM

## 2022-01-20 ENCOUNTER — Telehealth: Payer: Self-pay | Admitting: Nurse Practitioner

## 2022-01-27 NOTE — Telephone Encounter (Signed)
[employer] application submitted electronically.  AZ&ME application mailed to pt's home

## 2022-01-29 NOTE — Telephone Encounter (Signed)
Patient is aware of the application being submitted but she would like to speak to [place].

## 2022-02-03 NOTE — Telephone Encounter (Signed)
Can you check on patient assistance status in the coming week & reach out to patient? Thank you!

## 2022-02-04 NOTE — Telephone Encounter (Signed)
Left message informing patient of re-enrollment status. Informed patient that application processes during re-enrollment season can take 4-6 weeks to get a decision and have medication shipped to office. Gave pt [employer] phone number [phone]) to use automated prompts to follow up on enrollment status. Also informed patient to keep an eye out for mail from the company.

## 2022-02-05 DIAGNOSIS — H52209 Unspecified astigmatism, unspecified eye: Secondary | ICD-10-CM | POA: Diagnosis not present

## 2022-02-05 DIAGNOSIS — H524 Presbyopia: Secondary | ICD-10-CM | POA: Diagnosis not present

## 2022-02-05 DIAGNOSIS — H5203 Hypermetropia, bilateral: Secondary | ICD-10-CM | POA: Diagnosis not present

## 2022-02-17 ENCOUNTER — Telehealth: Payer: Self-pay | Admitting: *Deleted

## 2022-02-17 NOTE — Patient Outreach (Signed)
  Care Coordination   02/17/2022 Name: Alexandra Hunt MRN: [id number] DOB: [birth year]   Care Coordination Outreach Attempts:  An unsuccessful telephone outreach was attempted today to offer the patient information about available care coordination services as a benefit of their health plan.   Follow Up Plan:  Additional outreach attempts will be made to offer the patient care coordination information and services.   Encounter Outcome:  No Answer   Care Coordination Interventions:  No, not indicated    SIG Ashling Roane C. Myrtie Neither, MSN, [facility] Gerontological Nurse Practitioner [facility] Care Management [phone]

## 2022-02-26 ENCOUNTER — Telehealth: Payer: Self-pay | Admitting: Pharmacist

## 2022-02-26 NOTE — Telephone Encounter (Signed)
At your convenience, can you please reach out to patient regarding her PAP status? Insulin, ozempic, farxiga I have let her know there are delays and we are doing the best we can Also, I told her we are unable to get inhaler (albuterol) assistance

## 2022-03-04 NOTE — Telephone Encounter (Signed)
Patient called requesting to speak with [facility].  Rosendo Gros, can you call patient?

## 2022-03-06 ENCOUNTER — Other Ambulatory Visit: Payer: Self-pay | Admitting: Nurse Practitioner

## 2022-03-06 DIAGNOSIS — G5603 Carpal tunnel syndrome, bilateral upper limbs: Secondary | ICD-10-CM

## 2022-03-07 ENCOUNTER — Other Ambulatory Visit: Payer: Self-pay | Admitting: Nurse Practitioner

## 2022-03-10 ENCOUNTER — Encounter

## 2022-03-11 ENCOUNTER — Telehealth: Payer: Self-pay | Admitting: Nurse Practitioner

## 2022-03-11 DIAGNOSIS — E1165 Type 2 diabetes mellitus with hyperglycemia: Secondary | ICD-10-CM

## 2022-03-11 MED ORDER — SEMAGLUTIDE(0.25 OR 0.5MG/DOS) 2 MG/3ML ~~LOC~~ SOPN: 0.2500 mg | PEN_INJECTOR | SUBCUTANEOUS | 2 refills | Status: DC

## 2022-03-11 MED ORDER — TOUJEO SOLOSTAR 300 UNIT/ML ~~LOC~~ SOPN: 90.0000 [IU] | PEN_INJECTOR | Freq: Two times a day (BID) | SUBCUTANEOUS | 2 refills | Status: DC

## 2022-03-11 NOTE — Telephone Encounter (Signed)
Refills sent to pharmacy per patients request. Patient notified and verbalized understanding

## 2022-03-12 ENCOUNTER — Other Ambulatory Visit: Payer: Self-pay

## 2022-03-12 DIAGNOSIS — E1165 Type 2 diabetes mellitus with hyperglycemia: Secondary | ICD-10-CM

## 2022-03-12 MED ORDER — SEMAGLUTIDE(0.25 OR 0.5MG/DOS) 2 MG/3ML ~~LOC~~ SOPN: 0.2500 mg | PEN_INJECTOR | SUBCUTANEOUS | 2 refills | Status: DC

## 2022-03-12 NOTE — Telephone Encounter (Signed)
[facility] and [facility] notified

## 2022-03-12 NOTE — Telephone Encounter (Signed)
[facility]. Need to resend.

## 2022-03-16 DIAGNOSIS — E1165 Type 2 diabetes mellitus with hyperglycemia: Secondary | ICD-10-CM | POA: Diagnosis not present

## 2022-03-18 DIAGNOSIS — M65332 Trigger finger, left middle finger: Secondary | ICD-10-CM | POA: Diagnosis not present

## 2022-03-18 DIAGNOSIS — G5603 Carpal tunnel syndrome, bilateral upper limbs: Secondary | ICD-10-CM | POA: Diagnosis not present

## 2022-03-18 DIAGNOSIS — G5621 Lesion of ulnar nerve, right upper limb: Secondary | ICD-10-CM | POA: Diagnosis not present

## 2022-03-24 ENCOUNTER — Ambulatory Visit: Payer: Medicare HMO

## 2022-03-24 VITALS — Ht 64.0 in | Wt 192.0 lb

## 2022-03-24 DIAGNOSIS — Z780 Asymptomatic menopausal state: Secondary | ICD-10-CM

## 2022-03-24 DIAGNOSIS — Z0000 Encounter for general adult medical examination without abnormal findings: Secondary | ICD-10-CM | POA: Diagnosis not present

## 2022-03-24 NOTE — Progress Notes (Signed)
Subjective:   Alexandra Hunt is a 77 y.o. female who presents for Medicare Annual (Subsequent) preventive examination. I connected with  Alexandra Hunt on 03/24/22 by a audio enabled telemedicine application and verified that I am speaking with the correct person using two identifiers.  Patient Location: Home  Provider Location: Home Office  I discussed the limitations of evaluation and management by telemedicine. The patient expressed understanding and agreed to proceed.  Review of Systems     Cardiac Risk Factors include: advanced age (>58mn, >>25women);diabetes mellitus;dyslipidemia;hypertension     Objective:    Today's Vitals   03/24/22 1545  Weight: 192 lb (87.1 kg)  Height: '5\' 4"'$  (1.626 m)   Body mass index is 32.96 kg/m.     03/24/2022    3:58 PM 09/23/2021    6:27 PM 05/18/2021   10:27 PM 05/15/2021    2:50 PM 03/06/2021   10:21 AM 03/05/2020   10:52 AM 07/03/2018    2:40 PM  Advanced Directives  Does Patient Have a Medical Advance Directive? Yes Yes No No No No No  [place] of [place]       Does patient want to make changes to medical advance directive?  No - Patient declined       Copy of HBelle [place]ainein Chart? No - copy requested No - copy requested       Would patient like information on creating a medical advance directive?    No - Patient declined No - Patient declined No - Patient declined Yes (MAU/Ambulatory/Procedural Areas - Information given)    Current Medications (verified) Outpatient Encounter Medications as of 03/24/2022  Medication Sig   albuterol (VENTOLIN HFA) 108 (90 Base) MCG/ACT inhaler Inhale 2 puffs into the lungs every 6 (six) hours as needed for wheezing or shortness of breath.   Alcohol Swabs (B-D SINGLE USE SWABS REGULAR) PADS TEST BS UP TO FOUR TIMES DAILY Dx E11.9   ALPHAGAN P 0.1 % SOLN Apply 1 drop to eye 2 (two) times daily.   aspirin  81 MG tablet Take 81 mg by mouth daily.   Blood Glucose Calibration (TRUE METRIX LEVEL 1) Low SOLN Use with glucose machine Dx E11.9,   Blood Glucose Monitoring Suppl (TRUE METRIX METER) w/Device KIT TEST BS UP TO FOUR TIMES DAILY Dx E11.9   Continuous Blood Gluc Receiver (FREESTYLE LIBRE 2 READER) DEVI Use to test blood sugar 4-6 times daily as directed DX: E11.9   Continuous Blood Gluc Sensor (FREESTYLE LIBRE 14 DAY SENSOR) MISC Change every 14 days as directed Dx E11.9   [employer] LUBRICANT EYE DROPS 0.6 % SOLN Apply 1 drop to eye 2 (two) times daily.   dapagliflozin propanediol (FARXIGA) 5 MG TABS tablet Take 1 tablet (5 mg total) by mouth daily.   diclofenac (VOLTAREN) 75 MG EC tablet TAKE 1 TABLET BY MOUTH TWICE A DAY AS NEEDED   dorzolamide (TRUSOPT) 2 % ophthalmic solution Place 2 drops into both eyes 2 (two) times daily.   fenofibrate 160 MG tablet Take 1 tablet (160 mg total) by mouth daily.   fluticasone (FLONASE) 50 MCG/ACT nasal spray SPRAY 2 SPRAYS INTO EACH NOSTRIL EVERY DAY   furosemide (LASIX) 20 MG tablet TAKE 1 TABLET BY MOUTH EVERY DAY   gabapentin (NEURONTIN) 100 MG capsule Take 1 capsule ('100mg'$ ) by mouth in the morning, 1 capsule ('100mg'$ ) in the afternoon, & 2 capsules ('200mg'$ ) in the  evening   glucose blood (TRUE METRIX BLOOD GLUCOSE TEST) test strip TEST BS UP TO FOUR TIMES DAILY Dx E11.9   ibuprofen (ADVIL) 800 MG tablet TAKE 1 TABLET BY MOUTH EVERY 8 HOURS AS NEEDED   insulin glargine, 1 Unit Dial, (TOUJEO SOLOSTAR) 300 UNIT/ML Solostar Pen Inject 90 Units into the skin in the morning and at bedtime.   Insulin Pen Needle (B-D UF III MINI PEN NEEDLES) 31G X 5 MM MISC USE WITH SLIDING SCALE HUMALOG 4 TIMES DAILY DX: E11.9   insulin regular (NOVOLIN R) 100 units/mL injection INJECT 22UNITS 4 TIMES A DAY PER SLIDING SCALE   Insulin Syringe-Needle U-100 (TRUEPLUS INSULIN SYRINGE) 31G X 5/16" 1 ML MISC Use daily with Lantus & Humalog insulin sliding scales as instructed Dx E11.9    Lancet Devices (TRUEDRAW LANCING DEVICE) MISC TEST BS UP TO FOUR TIMES DAILY Dx E11.9   lidocaine (LIDODERM) 5 % Place 1 patch onto the skin daily. Remove & Discard patch within 12 hours or as directed by MD   lisinopril (ZESTRIL) 20 MG tablet Take 1 tablet (20 mg total) by mouth daily.   LORazepam (ATIVAN) 1 MG tablet Take 1 tablet (1 mg total) by mouth 3 (three) times daily.   magic mouthwash SOLN Take 5 mLs by mouth 4 (four) times daily.   meclizine (ANTIVERT) 25 MG tablet Take 1 tablet (25 mg total) by mouth 3 (three) times daily as needed for dizziness.   meloxicam (MOBIC) 15 MG tablet Take 15 mg by mouth daily.   mupirocin cream (BACTROBAN) 2 % Apply 1 application topically 2 (two) times daily.   ondansetron (ZOFRAN) 4 MG tablet    promethazine-dextromethorphan (PROMETHAZINE-DM) 6.25-15 MG/5ML syrup Take 5 mLs by mouth 4 (four) times daily as needed for cough.   Semaglutide,0.25 or 0.'5MG'$ /DOS, 2 MG/3ML SOPN Inject 0.25 mg into the skin once a week. X4 weeks then increase to 0.'5mg'$  sq weekly.  Start after surgery on 11/04/21. DX: E11.65   sertraline (ZOLOFT) 100 MG tablet Take 2 tablets (200 mg total) by mouth daily.   silver sulfADIAZINE (SILVADENE) 1 % cream Apply 1 application topically 2 (two) times daily.   tolterodine (DETROL LA) 4 MG 24 hr capsule Take 1 capsule (4 mg total) by mouth daily.   traMADol (ULTRAM) 50 MG tablet Take 50 mg by mouth.   TRUEplus Lancets 33G MISC TEST BS UP TO FOUR TIMES DAILY Dx E11.9   Vitamin D, Ergocalciferol, (DRISDOL) 1.25 MG (50000 UNIT) CAPS capsule TAKE 1 CAPSULE EVERY 7 DAYS   No facility-administered encounter medications on file as of 03/24/2022.    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[place])    pt not taking her meds  (pt states this is an inaccurate diagnosis and wants it removed) 03-15-2018   Skin cancer    on arms/right hand   Sleep apnea    Past Surgical History:  Procedure Laterality Date   ABDOMINAL HYSTERECTOMY     due to uterine cancer   BREAST SURGERY     left breast biopsy/benign   TUBAL LIGATION     Family History  Problem Relation Age of Onset   Diabetes Mother    Blindness Mother        related to diabetes   Stroke Mother    Heart disease Father        MI   Heart attack Father    Heart defect Father    Breast cancer Sister    Diabetes Brother        diet controlled   Colon cancer Neg Hx    Esophageal cancer Neg Hx    Rectal cancer Neg Hx    Stomach cancer Neg Hx    Social History   Socioeconomic History   Marital status: Widowed    Spouse name: Not on file   Number of children: 5   Years of education: 17   Highest education level: Associate degree: occupational, Hotel manager, or vocational program  Occupational History   Occupation: retired  Tobacco Use   Smoking status: Never    Passive exposure: Never   Smokeless tobacco: Never  Vaping Use   Vaping Use: Never used  Substance and Sexual Activity   Alcohol use: No   Drug use: No   Sexual activity: Not Currently    Birth control/protection: Post-menopausal  Other Topics Concern   Not on file  Social History Narrative   Son, Daughter, and granddaughter live with her   Her daughter is going through drug addiction treatment   Lives in one level home with basement - she doesn't use stairs at all.   She has been very withdrawn and depressed since her husband passed.   Social Determinants of Health   Financial Resource  Strain: Low Risk  (03/24/2022)   Overall Financial Resource Strain (CARDIA)    Difficulty of Paying Living Expenses: Not hard at all  Food Insecurity: No Food Insecurity (03/24/2022)   Hunger Vital Sign    Worried About Running Out of Food in the Last Year: Never true    Ran Out of Food in the Last Year: Never true  Transportation Needs: No Transportation Needs (03/24/2022)   PRAPARE - Hydrologist (Medical): No    Lack of Transportation (Non-Medical): No  Physical Activity: Inactive (03/24/2022)   Exercise Vital Sign    Days of Exercise per Week: 0 days    Minutes of Exercise per Session: 0 min  Stress: No Stress Concern Present (03/24/2022)   [place]    Feeling of Stress : Only a little  Social Connections: Moderately Isolated (03/24/2022)   Social Connection and Isolation Panel [NHANES]    Frequency of Communication with Friends and Family: More than three times a week    Frequency of Social Gatherings with Friends and Family: More than three times a week    Attends Religious Services: 1 to 4 times per year    Active Member of [employer] or Organizations: No    Attends Archivist Meetings: Never    Marital Status: Widowed    Tobacco Counseling Counseling given: Not Answered   Clinical Intake:  Pre-visit preparation completed: Yes  Pain : No/denies pain     Nutritional Risks: None Diabetes: Yes CBG done?: No Did pt. bring in CBG monitor from home?: No  How often do you  need to have someone help you when you read instructions, pamphlets, or other written materials from your doctor or pharmacy?: 1 - Never  Diabetic?yes  Interpreter Needed?: No Nutrition Risk Assessment:  Has the patient had any N/V/D within the last 2 months?  No  Does the patient have any non-healing wounds?  Yes  Has the patient had any unintentional weight loss or weight gain?  No    Diabetes:  Is the patient diabetic?  Yes  If diabetic, was a CBG obtained today?  No  Did the patient bring in their glucometer from home?  No  How often do you monitor your CBG's? Libre.   Financial Strains and Diabetes Management:  Are you having any financial strains with the device, your supplies or your medication? No .  Does the patient want to be seen by Chronic Care Management for management of their diabetes?  No  Would the patient like to be referred to a Nutritionist or for Diabetic Management?  No   Diabetic Exams:  Diabetic Eye Exam: Completed 03/2022 Diabetic Foot Exam: Overdue, Pt has been advised about the importance in completing this exam. Pt is scheduled for diabetic foot exam on next office visit .  Information entered by :: Jadene Pierini, LPN   Activities of Daily Living    03/24/2022    3:58 PM 09/23/2021    6:26 PM  In your present state of health, do you have any difficulty performing the following activities:  Hearing? 0 0  Vision? 0 0  Difficulty concentrating or making decisions? 0 0  Walking or climbing stairs? 0 0  Dressing or bathing? 0 0  Doing errands, shopping? 0 0  Preparing Food and eating ? N N  Using the Toilet? N N  In the past six months, have you accidently leaked urine? N Y  Comment  Occasional Urinary Incontinence  Do you have problems with loss of bowel control? N N  Managing your Medications? N N  Managing your Finances? N N  Housekeeping or managing your Housekeeping? N N    Patient Care Team: Chevis Pretty, FNP as PCP - General (Family Medicine) Maisie Fus, MD (Inactive) as Consulting Physician (Obstetrics and Gynecology) Michael Boston, MD as Consulting Physician (General Surgery) Gatha Mayer, MD as Consulting Physician (Gastroenterology) Lavera Guise, [facility] (Pharmacist) Gaynelle Arabian, MD as Consulting Physician (Orthopedic Surgery) Monna Fam, MD as Consulting Physician (Ophthalmology) Shea Evans  Norva Riffle, LCSW as [place] [employer] Management (Licensed Clinical Social Worker)  Indicate any recent [place] you may have received from other than [facility] providers in the past year (date may be approximate).     Assessment:   This is a routine wellness examination for Alexandra Hunt.  Hearing/Vision screen Vision Screening - Comments:: Wears rx glasses - up to date with routine eye exams with  Dr.Hecker  Dietary issues and exercise activities discussed: Current Exercise Habits: The patient does not participate in regular exercise at present, Exercise limited by: orthopedic condition(s)   Goals Addressed             This Visit's Progress    DIET - INCREASE WATER INTAKE   On track      Depression Screen    03/24/2022    3:55 PM 12/28/2021    3:48 PM 09/23/2021    6:21 PM 05/13/2021   10:24 AM 05/08/2021   10:14 AM 03/18/2021    2:19 PM 03/06/2021   10:15 AM  PHQ 2/9 Scores  PHQ - 2 Score 0 '1 1 2 4 4 4  '$ PHQ- 9 Score  '7  9 10 10 10    '$ Fall Risk    03/24/2022    3:46 PM 12/28/2021    3:48 PM 09/23/2021    6:26 PM 05/21/2021    8:06 AM 05/08/2021   10:14 AM  Fall Risk   Falls in the past year? 1 0 0 1 0  Number falls in past yr: 1  0 0   Injury with Fall? 1  0 1   Risk for fall due to : History of fall(s);Impaired balance/gait;Orthopedic patient  No Fall Risks History of fall(s)   Follow up Education provided;Falls prevention discussed  Falls evaluation completed;Education provided;Falls prevention discussed Education provided     FALL RISK PREVENTION PERTAINING TO THE HOME:  Any stairs in or around the home? No  If so, are there any without handrails? No  Home free of loose throw rugs in walkways, pet beds, electrical cords, etc? Yes  Adequate lighting in your home to reduce risk of falls? Yes   ASSISTIVE DEVICES UTILIZED TO PREVENT FALLS:  Life alert? No  Use of a cane, walker or w/c? Yes  Grab bars in the bathroom? No  Shower chair or bench in shower?  Yes  Elevated toilet seat or a handicapped toilet? No       03/14/2015    4:03 PM  MMSE - Mini Mental State Exam  Orientation to time 5  Orientation to Place 5  Registration 3  Attention/ Calculation 5  Recall 3  Language- name 2 objects 2  Language- repeat 1  Language- follow 3 step command 3  Language- read & follow direction 1  Write a sentence 1  Copy design 1  Total score 30        03/24/2022    3:58 PM 03/05/2020   10:59 AM 07/03/2018    2:59 PM  6CIT Screen  What Year? 0 points 0 points 0 points  What month? 0 points 0 points 0 points  What time? 0 points 0 points 0 points  Count back from 20 0 points 0 points 0 points  Months in reverse 0 points 2 points 2 points  Repeat phrase 0 points 0 points 0 points  Total Score 0 points 2 points 2 points    Immunizations Immunization History  Administered Date(s) Administered   Fluad Quad(high Dose 65+) 03/04/2020, 11/28/2020, 12/28/2021   Influenza, High Dose Seasonal PF 12/27/2016, 01/27/2018   Influenza,inj,Quad PF,6+ Mos 02/07/2013, 12/20/2014, 12/02/2015   Influenza,inj,quad, With Preservative 12/30/2016   Influenza-Unspecified 05/30/2014   Pneumococcal Conjugate-13 12/20/2014   Pneumococcal Polysaccharide-23 09/29/2012   Tdap 03/03/2012    TDAP status: Due, Education has been provided regarding the importance of this vaccine. Advised may receive this vaccine at local pharmacy or Health Dept. Aware to provide a copy of the vaccination record if obtained from local pharmacy or Health Dept. Verbalized acceptance and understanding.  Flu Vaccine status: Up to date  Pneumococcal vaccine status: Up to date  Covid-19 vaccine status: Completed vaccines  Qualifies for Shingles Vaccine? Yes   Zostavax completed No   Shingrix Completed?: No.    Education has been provided regarding the importance of this vaccine. Patient has been advised to call insurance company to determine out of pocket expense if they have not yet  received this vaccine. Advised may also receive vaccine at local pharmacy or Health Dept. Verbalized acceptance and understanding.  Screening Tests Health  Maintenance  Topic Date Due   COVID-19 Vaccine (1) Never done   DEXA SCAN  05/05/2017   DTaP/Tdap/Td (2 - Td or Tdap) 03/03/2022   Zoster Vaccines- Shingrix (1 of 2) 03/30/2022 (Originally 08/24/1964)   LIPID PANEL  03/30/2022   HEMOGLOBIN A1C  03/30/2022   MAMMOGRAM  05/08/2022   OPHTHALMOLOGY EXAM  12/23/2022   Diabetic kidney evaluation - eGFR measurement  12/29/2022   Diabetic kidney evaluation - Urine ACR  12/29/2022   FOOT EXAM  12/29/2022   COLONOSCOPY (Pts 45-29yr Insurance coverage will need to be confirmed)  03/15/2023   Medicare Annual Wellness (AWV)  03/25/2023   Pneumonia Vaccine 75 Years old  Completed   INFLUENZA VACCINE  Completed   Hepatitis C Screening  Completed   HPV VACCINES  Aged Out    Health Maintenance  Health Maintenance Due  Topic Date Due   COVID-19 Vaccine (1) Never done   DEXA SCAN  05/05/2017   DTaP/Tdap/Td (2 - Td or Tdap) 03/03/2022    Colorectal cancer screening: Type of screening: Colonoscopy. Completed 03/14/2018. Repeat every 10 years  Mammogram status: Completed 05/07/2021. Repeat every year  Bone Density status: Ordered 03/24/2022. Pt provided with contact info and advised to call to schedule appt.  Lung Cancer Screening: (Low Dose CT Chest recommended if Age 75-80years, 30 pack-year currently smoking OR have quit w/in 15years.) does not qualify.   Lung Cancer Screening Referral: n/a  Additional Screening:  Hepatitis C Screening: does not qualify;   Vision Screening: Recommended annual ophthalmology exams for early detection of glaucoma and other disorders of the eye. Is the patient up to date with their annual eye exam?  Yes  Who is the provider or what is the name of the office in which the patient attends annual eye exams? Dr.Hecker  If pt is not established with a  provider, would they like to be referred to a provider to establish care? No .   Dental Screening: Recommended annual dental exams for proper oral hygiene  Community Resource Referral / Chronic Care Management: CRR required this visit?  No   CCM required this visit?  No      Plan:     I have personally reviewed and noted the following in the patient's chart:   Medical and social history Use of alcohol, tobacco or illicit drugs  Current medications and supplements including opioid prescriptions. Patient is not currently taking opioid prescriptions. Functional ability and status Nutritional status Physical activity Advanced directives List of other physicians Hospitalizations, surgeries, and ER visits in previous 12 months Vitals Screenings to include cognitive, depression, and falls Referrals and appointments  In addition, I have reviewed and discussed with patient certain preventive protocols, quality metrics, and best practice recommendations. A written personalized care plan for preventive services as well as general preventive health recommendations were provided to patient.     LDaphane Shepherd LPN   16/76/1948  Nurse Notes: Due TDAP Vaccine

## 2022-03-24 NOTE — Patient Instructions (Signed)
Alexandra Hunt , Thank you for taking time to come for your Medicare Wellness Visit. I appreciate your ongoing commitment to your health goals. Please review the following plan we discussed and let me know if I can assist you in the future.   These are the goals we discussed:  Goals       Coping Skills Enhanced.Manage depression; Manage anxiety. Manage food needs (pt-stated)      Time Frame:  Short Term Goal Priority:  Medium  Progress:  Not On Track  Start Date:  03/18/21 Expected End Date:  08/12/21    Follow Up Date:  07/06/21 at 3:00 PM  Coping Skills Enhanced. Manage depression issues. Manage anxiety issues. Manage food needs of client  Patient Coping Skills:  Takes medications as prescribed Attends scheduled medical appointments  Patient Deficits:  Financial needs Depression issues; anxiety issues Mobility issues Food needs.  Patient Goals: In next 30 days, client will Attend scheduled medical appointments Communicate as needed with RNCM about nursing needs of client Communicate as needed with SW about depression issues of client and about food needs of client  Follow Up Plan: LCSW to call client on 07/06/21 at 3:00 PM  to assess client needs       DIET - INCREASE WATER INTAKE      Exercise 3x per week (30 min per time)      Walking or riding stationary bicycle are great options.      Patient Stated (pt-stated)      " I would like to get healthier"      T2DM PHARMD GOAL (pt-stated)      Current Barriers:  Unable to independently afford treatment regimen Unable to achieve control of T2DM  Suboptimal therapeutic regimen for T2DM  Pharmacist Clinical Goal(s):  patient will verbalize ability to afford treatment regimen achieve control of T2DM as evidenced by GOAL A1C, IMPROVED TIME IN TARGET RANGE through collaboration with PharmD and provider.   Interventions: 1:1 collaboration with Chevis Pretty, FNP regarding development and update of comprehensive plan  of care as evidenced by provider attestation and co-signature Inter-disciplinary care team collaboration (see longitudinal plan of care) Comprehensive medication review performed; medication list updated in electronic medical record  Diabetes: Goal on track: NO. 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  Current treatment: toujeo, novolin R, farxiga, metformin Need to optimize regimen START Ozempic 0.'25mg'$  sq weekly after surgery on 9/6 Will need to do PAP--awaiting patient forms, patient can't afford Denies personal and family history of Medullary thyroid cancer (MTC) Current glucose readings: fasting glucose: 200s, post prandial glucose: >200 Discussed meal planning options and Plate method for healthy eating Avoid/limit sugary drinks and desserts Incorporate balanced protein, non starchy veggies, 1 serving of carbohydrate with each meal Increase water intake Increase physical activity as able Current exercise: n/a; encouraged Recommended add GLP1-new start ozempic Assessed patient finances. Awaiting patient finances to enroll in patient assistance   Patient Goals/Self-Care Activities patient will:  - take medications as prescribed as evidenced by patient report and record review check glucose using CGM, document, and provide at future appointments collaborate with provider on medication access solutions target a minimum of 150 minutes of moderate intensity exercise weekly engage in dietary modifications by  FOLLOWING A HEART HEALTHY DIET/HEALTHY PLATE METHOD (will also help in lowering cholesterol)          This is a list of the screening recommended for you and due dates:  Health Maintenance  Topic Date Due   COVID-19 Vaccine (  1) Never done   DEXA scan (bone density measurement)  05/05/2017   DTaP/Tdap/Td vaccine (2 - Td or Tdap) 03/03/2022   Zoster (Shingles) Vaccine (1 of 2) 03/30/2022*   Lipid (cholesterol) test  03/30/2022   Hemoglobin A1C  03/30/2022    Mammogram  05/08/2022   Eye exam for diabetics  12/23/2022   Yearly kidney function blood test for diabetes  12/29/2022   Yearly kidney health urinalysis for diabetes  12/29/2022   Complete foot exam   12/29/2022   Colon Cancer Screening  03/15/2023   Medicare Annual Wellness Visit  03/25/2023   Pneumonia Vaccine  Completed   Flu Shot  Completed   Hepatitis C Screening: USPSTF Recommendation to screen - Ages 18-79 yo.  Completed   HPV Vaccine  Aged Out  *Topic was postponed. The date shown is not the original due date.    Advanced directives: Advance directive discussed with you today. I have provided a copy for you to complete at home and have notarized. Once this is complete please bring a copy in to our office so we can scan it into your chart.   Conditions/risks identified: Aim for 30 minutes of exercise or brisk walking, 6-8 glasses of water, and 5 servings of fruits and vegetables each day.   Next appointment: Follow up in one year for your annual wellness visit    Preventive Care 65 Years and Older, Female Preventive care refers to lifestyle choices and visits with your health care provider that can promote health and wellness. What does preventive care include? A yearly physical exam. This is also called an annual well check. Dental exams once or twice a year. Routine eye exams. Ask your health care provider how often you should have your eyes checked. Personal lifestyle choices, including: Daily care of your teeth and gums. Regular physical activity. Eating a healthy diet. Avoiding tobacco and drug use. Limiting alcohol use. Practicing safe sex. Taking low-dose aspirin every day. Taking vitamin and mineral supplements as recommended by your health care provider. What happens during an annual well check? The services and screenings done by your health care provider during your annual well check will depend on your age, overall health, lifestyle risk factors, and family  history of disease. Counseling  Your health care provider may ask you questions about your: Alcohol use. Tobacco use. Drug use. Emotional well-being. Home and relationship well-being. Sexual activity. Eating habits. History of falls. Memory and ability to understand (cognition). Work and work Statistician. Reproductive health. Screening  You may have the following tests or measurements: Height, weight, and BMI. Blood pressure. Lipid and cholesterol levels. These may be checked every 5 years, or more frequently if you are over 70 years old. Skin check. Lung cancer screening. You may have this screening every year starting at age 81 if you have a 30-pack-year history of smoking and currently smoke or have quit within the past 15 years. Fecal occult blood test (FOBT) of the stool. You may have this test every year starting at age 40. Flexible sigmoidoscopy or colonoscopy. You may have a sigmoidoscopy every 5 years or a colonoscopy every 10 years starting at age 35. Hepatitis C blood test. Hepatitis B blood test. Sexually transmitted disease (STD) testing. Diabetes screening. This is done by checking your blood sugar (glucose) after you have not eaten for a while (fasting). You may have this done every 1-3 years. Bone density scan. This is done to screen for osteoporosis. You may have this done starting  at age 64. Mammogram. This may be done every 1-2 years. Talk to your health care provider about how often you should have regular mammograms. Talk with your health care provider about your test results, treatment options, and if necessary, the need for more tests. Vaccines  Your health care provider may recommend certain vaccines, such as: Influenza vaccine. This is recommended every year. Tetanus, diphtheria, and acellular pertussis (Tdap, Td) vaccine. You may need a Td booster every 10 years. Zoster vaccine. You may need this after age 65. Pneumococcal 13-valent conjugate (PCV13)  vaccine. One dose is recommended after age 20. Pneumococcal polysaccharide (PPSV23) vaccine. One dose is recommended after age 17. Talk to your health care provider about which screenings and vaccines you need and how often you need them. This information is not intended to replace advice given to you by your health care provider. Make sure you discuss any questions you have with your health care provider. Document Released: 03/14/2015 Document Revised: 11/05/2015 Document Reviewed: 12/17/2014 Elsevier Interactive Patient Education  2017 [place] Prevention in the Home Falls can cause injuries. They can happen to people of all ages. There are many things you can do to make your home safe and to help prevent falls. What can I do on the outside of my home? Regularly fix the edges of walkways and driveways and fix any cracks. Remove anything that might make you trip as you walk through a door, such as a raised step or threshold. Trim any bushes or trees on the path to your home. Use bright outdoor lighting. Clear any walking paths of anything that might make someone trip, such as rocks or tools. Regularly check to see if handrails are loose or broken. Make sure that both sides of any steps have handrails. Any raised decks and porches should have guardrails on the edges. Have any leaves, snow, or ice cleared regularly. Use sand or salt on walking paths during winter. Clean up any spills in your garage right away. This includes oil or grease spills. What can I do in the bathroom? Use night lights. Install grab bars by the toilet and in the tub and shower. Do not use towel bars as grab bars. Use non-skid mats or decals in the tub or shower. If you need to sit down in the shower, use a plastic, non-slip stool. Keep the floor dry. Clean up any water that spills on the floor as soon as it happens. Remove soap buildup in the tub or shower regularly. Attach bath mats securely with  double-sided non-slip rug tape. Do not have throw rugs and other things on the floor that can make you trip. What can I do in the bedroom? Use night lights. Make sure that you have a light by your bed that is easy to reach. Do not use any sheets or blankets that are too big for your bed. They should not hang down onto the floor. Have a firm chair that has side arms. You can use this for support while you get dressed. Do not have throw rugs and other things on the floor that can make you trip. What can I do in the kitchen? Clean up any spills right away. Avoid walking on wet floors. Keep items that you use a lot in easy-to-reach places. If you need to reach something above you, use a strong step stool that has a grab bar. Keep electrical cords out of the way. Do not use floor polish or wax that makes  floors slippery. If you must use wax, use non-skid floor wax. Do not have throw rugs and other things on the floor that can make you trip. What can I do with my stairs? Do not leave any items on the stairs. Make sure that there are handrails on both sides of the stairs and use them. Fix handrails that are broken or loose. Make sure that handrails are as long as the stairways. Check any carpeting to make sure that it is firmly attached to the stairs. Fix any carpet that is loose or worn. Avoid having throw rugs at the top or bottom of the stairs. If you do have throw rugs, attach them to the floor with carpet tape. Make sure that you have a light switch at the top of the stairs and the bottom of the stairs. If you do not have them, ask someone to add them for you. What else can I do to help prevent falls? Wear shoes that: Do not have high heels. Have rubber bottoms. Are comfortable and fit you well. Are closed at the toe. Do not wear sandals. If you use a stepladder: Make sure that it is fully opened. Do not climb a closed stepladder. Make sure that both sides of the stepladder are locked  into place. Ask someone to hold it for you, if possible. Clearly mark and make sure that you can see: Any grab bars or handrails. First and last steps. Where the edge of each step is. Use tools that help you move around (mobility aids) if they are needed. These include: Canes. Walkers. Scooters. Crutches. Turn on the lights when you go into a dark area. Replace any light bulbs as soon as they burn out. Set up your furniture so you have a clear path. Avoid moving your furniture around. If any of your floors are uneven, fix them. If there are any pets around you, be aware of where they are. Review your medicines with your doctor. Some medicines can make you feel dizzy. This can increase your chance of falling. Ask your doctor what other things that you can do to help prevent falls. This information is not intended to replace advice given to you by your health care provider. Make sure you discuss any questions you have with your health care provider. Document Released: 12/12/2008 Document Revised: 07/24/2015 Document Reviewed: 03/22/2014 Elsevier Interactive Patient Education  2017 [employer].

## 2022-03-29 DIAGNOSIS — Z794 Long term (current) use of insulin: Secondary | ICD-10-CM | POA: Diagnosis not present

## 2022-03-29 DIAGNOSIS — H40023 Open angle with borderline findings, high risk, bilateral: Secondary | ICD-10-CM | POA: Diagnosis not present

## 2022-03-29 DIAGNOSIS — H353132 Nonexudative age-related macular degeneration, bilateral, intermediate dry stage: Secondary | ICD-10-CM | POA: Diagnosis not present

## 2022-03-29 LAB — HM DIABETES EYE EXAM

## 2022-03-30 ENCOUNTER — Ambulatory Visit: Payer: Medicare HMO | Admitting: Nurse Practitioner

## 2022-04-07 ENCOUNTER — Other Ambulatory Visit: Payer: Self-pay

## 2022-04-07 DIAGNOSIS — Z780 Asymptomatic menopausal state: Secondary | ICD-10-CM

## 2022-04-08 DIAGNOSIS — G720 Drug-induced myopathy: Secondary | ICD-10-CM

## 2022-04-08 NOTE — Progress Notes (Signed)
[facility]) [place] Team Statin Quality Measure Assessment  04/08/2022  Alexandra Hunt Jan 17, 1946 [id number]   I am a [facility] clinical pharmacist that reviews patients for statin quality initiatives.     Per review of chart and payor information, patient has a diagnosis of Diabetes. This places patient into the Statin Use in Patients with Diabetes (SUPD) measure for CMS. The patient is not currently filling a statin prescription as a result of previous statin intolerance myopathy. There is no antihyperlipidemic medication on file.  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 Next appointment is on 04/09/2022. If deemed therapeutically appropriate, please consider associating exclusion code (see options below) to remove the patient from this year's measure.  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  Reed Breech, PharmD Clinical Pharmacist  [place] [phone]

## 2022-04-09 ENCOUNTER — Encounter: Payer: Self-pay | Admitting: Nurse Practitioner

## 2022-04-09 ENCOUNTER — Ambulatory Visit (INDEPENDENT_AMBULATORY_CARE_PROVIDER_SITE_OTHER): Payer: Medicare HMO | Admitting: Nurse Practitioner

## 2022-04-09 VITALS — BP 114/61 | HR 98 | Temp 97.4°F | Resp 20 | Ht 64.0 in | Wt 198.0 lb

## 2022-04-09 DIAGNOSIS — F339 Major depressive disorder, recurrent, unspecified: Secondary | ICD-10-CM

## 2022-04-09 DIAGNOSIS — E782 Mixed hyperlipidemia: Secondary | ICD-10-CM | POA: Diagnosis not present

## 2022-04-09 DIAGNOSIS — M25562 Pain in left knee: Secondary | ICD-10-CM

## 2022-04-09 DIAGNOSIS — R6889 Other general symptoms and signs: Secondary | ICD-10-CM | POA: Diagnosis not present

## 2022-04-09 DIAGNOSIS — N393 Stress incontinence (female) (male): Secondary | ICD-10-CM

## 2022-04-09 DIAGNOSIS — M8588 Other specified disorders of bone density and structure, other site: Secondary | ICD-10-CM

## 2022-04-09 DIAGNOSIS — F411 Generalized anxiety disorder: Secondary | ICD-10-CM | POA: Diagnosis not present

## 2022-04-09 DIAGNOSIS — E1142 Type 2 diabetes mellitus with diabetic polyneuropathy: Secondary | ICD-10-CM

## 2022-04-09 DIAGNOSIS — R7989 Other specified abnormal findings of blood chemistry: Secondary | ICD-10-CM | POA: Diagnosis not present

## 2022-04-09 DIAGNOSIS — G8929 Other chronic pain: Secondary | ICD-10-CM

## 2022-04-09 DIAGNOSIS — Z6832 Body mass index (BMI) 32.0-32.9, adult: Secondary | ICD-10-CM

## 2022-04-09 DIAGNOSIS — E1165 Type 2 diabetes mellitus with hyperglycemia: Secondary | ICD-10-CM | POA: Diagnosis not present

## 2022-04-09 DIAGNOSIS — I10 Essential (primary) hypertension: Secondary | ICD-10-CM

## 2022-04-09 DIAGNOSIS — L989 Disorder of the skin and subcutaneous tissue, unspecified: Secondary | ICD-10-CM

## 2022-04-09 LAB — BAYER DCA HB A1C WAIVED: HB A1C (BAYER DCA - WAIVED): 7.9 % — ABNORMAL HIGH (ref 4.8–5.6)

## 2022-04-09 LAB — LIPID PANEL

## 2022-04-09 MED ORDER — LORAZEPAM 1 MG PO TABS: 1.0000 mg | ORAL_TABLET | Freq: Three times a day (TID) | ORAL | 2 refills | Status: DC

## 2022-04-09 MED ORDER — FUROSEMIDE 20 MG PO TABS: ORAL_TABLET | ORAL | 1 refills | Status: DC

## 2022-04-09 MED ORDER — MUPIROCIN CALCIUM 2 % EX CREA: 1.0000 | TOPICAL_CREAM | Freq: Two times a day (BID) | CUTANEOUS | 0 refills | Status: DC

## 2022-04-09 MED ORDER — SERTRALINE HCL 100 MG PO TABS: 200.0000 mg | ORAL_TABLET | Freq: Every day | ORAL | 1 refills | Status: DC

## 2022-04-09 MED ORDER — LISINOPRIL 20 MG PO TABS: 20.0000 mg | ORAL_TABLET | Freq: Every day | ORAL | 1 refills | Status: DC

## 2022-04-09 MED ORDER — FENOFIBRATE 160 MG PO TABS: 160.0000 mg | ORAL_TABLET | Freq: Every day | ORAL | 1 refills | Status: DC

## 2022-04-09 MED ORDER — TOLTERODINE TARTRATE ER 4 MG PO CP24: 4.0000 mg | ORAL_CAPSULE | Freq: Every day | ORAL | 1 refills | Status: DC

## 2022-04-09 NOTE — Progress Notes (Signed)
++  Subjective:    Patient ID: Alexandra Hunt, female    DOB: 08-Dec-1945, 77 y.o.   MRN: [id number]   Chief Complaint: medical management of chronic issues     HPI:  Alexandra Hunt is a 77 y.o. who identifies as a female who was assigned female at birth.   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 Essential hypertension, malignant No c/o chest pain, sob or headache. She occasionally checks her blood pressure at home. BP Readings from Last 3 Encounters:  12/28/21 113/63  09/09/21 127/70  05/21/21 126/68     2. Mixed hyperlipidemia Does not watch diet and does no exercises at all. Lab Results  Component Value Date   CHOL 140 12/28/2021   HDL 32 (L) 12/28/2021   LDLCALC 69 12/28/2021   TRIG 235 (H) 12/28/2021   CHOLHDL 4.4 12/28/2021     3. Uncontrolled type 2 diabetes mellitus with hyperglycemia (HCC) Fasting blood sugars are running around 120-160. A few readings in 200. Patient is never compliant with her diabetes.. her last hgba1c was 7.8 which was an improvement from 9.0. Lab Results  Component Value Date   HGBA1C 7.9 (H) 04/09/2022     4. Diabetic polyneuropathy associated with type 2 diabetes mellitus ([place]) Her feet burn and hurt all of the time. Denies any sores on feet. Sheis not taking her neurontin daily. Sees Steffanie Rainwater  5. Stress incontinence of urine Is on detrol La and says it helps keep her from having  to go  to the restroom as often, especially at night. But she is having a lot of accidents in mornings  6. Episode of recurrent major depressive disorder, unspecified depression episode severity ([place]) Patient has been on zoloft for awhile now. She is always depressed, because of her living situation she says.     04/09/2022    3:58 PM 03/24/2022    3:55 PM 12/28/2021    3:48 PM  Depression screen PHQ  2/9  Decreased Interest 3 0 0  Down, Depressed, Hopeless 2 0 1  PHQ - 2 Score 5 0 1  Altered sleeping 2  2  Tired, decreased energy 2  1  Change in appetite 2  1  Feeling bad or failure about yourself  2  1  Trouble concentrating 0  1  Moving slowly or fidgety/restless 0  0  Suicidal thoughts 0  0  PHQ-9 Score 13  7  Difficult doing work/chores Somewhat difficult  Not difficult at all     7. GAD (generalized anxiety disorder) Is on ativan TID and that helps her manage in her living situation.    04/09/2022    3:58 PM 12/28/2021    3:49 PM 05/08/2021   10:15 AM 03/18/2021    2:30 PM  GAD 7 : Generalized Anxiety Score  Nervous, Anxious, on Edge 2 2 2 2  $ Control/stop worrying 2 2 2 2  $ Worry too much - different things 2 2 2 2  $ Trouble relaxing 1 1 1 1  $ Restless 1 1 1 1  $ Easily annoyed or irritable 0 0 0 0  Afraid - awful might happen 1 1 1 1  $ Total GAD 7 Score 9 9 9 9  $ Anxiety Difficulty Somewhat difficult Somewhat difficult Somewhat difficult Somewhat difficult      8. Osteopenia of  lumbar spine Last dexascan was done in 2017.   9. Low serum vitamin D She is not on daily vitamin d supplement Last vitamin D Lab Results  Component Value Date   VD25OH 33.8 03/04/2020     10. BMI 32.0-32.9,adult Weight is up 6lbs Wt Readings from Last 3 Encounters:  04/09/22 198 lb (89.8 kg)  03/24/22 192 lb (87.1 kg)  12/28/21 199 lb (90.3 kg)   BMI Readings from Last 3 Encounters:  04/09/22 33.99 kg/m  03/24/22 32.96 kg/m  12/28/21 34.16 kg/m     New complaints: None today  Allergies  Allergen Reactions   Penicillins     SOB,rash   Ivp Dye [Iodinated Contrast Media]     Burning at IV site   Statins     myalgias   Codeine     nauseated   Latex     rash   Sulfa Antibiotics     nauseated   Outpatient Encounter Medications as of 04/09/2022  Medication Sig   albuterol (VENTOLIN HFA) 108 (90 Base) MCG/ACT inhaler Inhale 2 puffs into the lungs every 6 (six) hours  as needed for wheezing or shortness of breath.   Alcohol Swabs (B-D SINGLE USE SWABS REGULAR) PADS TEST BS UP TO FOUR TIMES DAILY Dx E11.9   ALPHAGAN P 0.1 % SOLN Apply 1 drop to eye 2 (two) times daily.   aspirin 81 MG tablet Take 81 mg by mouth daily.   Blood Glucose Calibration (TRUE METRIX LEVEL 1) Low SOLN Use with glucose machine Dx E11.9,   Blood Glucose Monitoring Suppl (TRUE METRIX METER) w/Device KIT TEST BS UP TO FOUR TIMES DAILY Dx E11.9   Continuous Blood Gluc Receiver (FREESTYLE LIBRE 2 READER) DEVI Use to test blood sugar 4-6 times daily as directed DX: E11.9   Continuous Blood Gluc Sensor (FREESTYLE LIBRE 14 DAY SENSOR) MISC Change every 14 days as directed Dx E11.9   [employer] LUBRICANT EYE DROPS 0.6 % SOLN Apply 1 drop to eye 2 (two) times daily.   dapagliflozin propanediol (FARXIGA) 5 MG TABS tablet Take 1 tablet (5 mg total) by mouth daily.   diclofenac (VOLTAREN) 75 MG EC tablet TAKE 1 TABLET BY MOUTH TWICE A DAY AS NEEDED   dorzolamide (TRUSOPT) 2 % ophthalmic solution Place 2 drops into both eyes 2 (two) times daily.   fenofibrate 160 MG tablet Take 1 tablet (160 mg total) by mouth daily.   fluticasone (FLONASE) 50 MCG/ACT nasal spray SPRAY 2 SPRAYS INTO EACH NOSTRIL EVERY DAY   furosemide (LASIX) 20 MG tablet TAKE 1 TABLET BY MOUTH EVERY DAY   gabapentin (NEURONTIN) 100 MG capsule Take 1 capsule (162m) by mouth in the morning, 1 capsule (1088m in the afternoon, & 2 capsules (20071min the evening   glucose blood (TRUE METRIX BLOOD GLUCOSE TEST) test strip TEST BS UP TO FOUR TIMES DAILY Dx E11.9   ibuprofen (ADVIL) 800 MG tablet TAKE 1 TABLET BY MOUTH EVERY 8 HOURS AS NEEDED   insulin glargine, 1 Unit Dial, (TOUJEO SOLOSTAR) 300 UNIT/ML Solostar Pen Inject 90 Units into the skin in the morning and at bedtime.   Insulin Pen Needle (B-D UF III MINI PEN NEEDLES) 31G X 5 MM MISC USE WITH SLIDING SCALE HUMALOG 4 TIMES DAILY DX: E11.9   insulin regular (NOVOLIN R) 100 units/mL  injection INJECT 22UNITS 4 TIMES A DAY PER SLIDING SCALE   Insulin Syringe-Needle U-100 (TRUEPLUS INSULIN SYRINGE) 31G X 5/16" 1 ML MISC Use daily with Lantus &  Humalog insulin sliding scales as instructed Dx E11.9   Lancet Devices (TRUEDRAW LANCING DEVICE) MISC TEST BS UP TO FOUR TIMES DAILY Dx E11.9   lidocaine (LIDODERM) 5 % Place 1 patch onto the skin daily. Remove & Discard patch within 12 hours or as directed by MD   lisinopril (ZESTRIL) 20 MG tablet Take 1 tablet (20 mg total) by mouth daily.   LORazepam (ATIVAN) 1 MG tablet Take 1 tablet (1 mg total) by mouth 3 (three) times daily.   magic mouthwash SOLN Take 5 mLs by mouth 4 (four) times daily.   meclizine (ANTIVERT) 25 MG tablet Take 1 tablet (25 mg total) by mouth 3 (three) times daily as needed for dizziness.   meloxicam (MOBIC) 15 MG tablet Take 15 mg by mouth daily.   mupirocin cream (BACTROBAN) 2 % Apply 1 application topically 2 (two) times daily.   ondansetron (ZOFRAN) 4 MG tablet    promethazine-dextromethorphan (PROMETHAZINE-DM) 6.25-15 MG/5ML syrup Take 5 mLs by mouth 4 (four) times daily as needed for cough.   Semaglutide,0.25 or 0.5MG/DOS, 2 MG/3ML SOPN Inject 0.25 mg into the skin once a week. X4 weeks then increase to 0.44m sq weekly.  Start after surgery on 11/04/21. DX: E11.65   sertraline (ZOLOFT) 100 MG tablet Take 2 tablets (200 mg total) by mouth daily.   silver sulfADIAZINE (SILVADENE) 1 % cream Apply 1 application topically 2 (two) times daily.   tolterodine (DETROL LA) 4 MG 24 hr capsule Take 1 capsule (4 mg total) by mouth daily.   traMADol (ULTRAM) 50 MG tablet Take 50 mg by mouth.   TRUEplus Lancets 33G MISC TEST BS UP TO FOUR TIMES DAILY Dx E11.9   Vitamin D, Ergocalciferol, (DRISDOL) 1.25 MG (50000 UNIT) CAPS capsule TAKE 1 CAPSULE EVERY 7 DAYS   No facility-administered encounter medications on file as of 04/09/2022.    Past Surgical History:  Procedure Laterality Date   ABDOMINAL HYSTERECTOMY     due to  uterine cancer   BREAST SURGERY     left breast biopsy/benign   TUBAL LIGATION      Family History  Problem Relation Age of Onset   Diabetes Mother    Blindness Mother        related to diabetes   Stroke Mother    Heart disease Father        MI   Heart attack Father    Heart defect Father    Breast cancer Sister    Diabetes Brother        diet controlled   Colon cancer Neg Hx    Esophageal cancer Neg Hx    Rectal cancer Neg Hx    Stomach cancer Neg Hx       Controlled substance contract: 04/09/22 drug screen doen today     Review of Systems  Constitutional:  Negative for diaphoresis.  Eyes:  Negative for pain.  Respiratory:  Negative for shortness of breath.   Cardiovascular:  Negative for chest pain, palpitations and leg swelling.  Gastrointestinal:  Negative for abdominal pain.  Endocrine: Negative for polydipsia.  Skin:  Negative for rash.  Neurological:  Negative for dizziness, weakness and headaches.  Hematological:  Does not bruise/bleed easily.  All other systems reviewed and are negative.      Objective:   Physical Exam Vitals and nursing note reviewed.  Constitutional:      General: She is not in acute distress.    Appearance: Normal appearance. She is well-developed.  HENT:  Head: Normocephalic.     Right Ear: Tympanic membrane normal.     Left Ear: Tympanic membrane normal.     Nose: Nose normal.     Mouth/Throat:     Mouth: Mucous membranes are moist.  Eyes:     Pupils: Pupils are equal, round, and reactive to light.  Neck:     Vascular: No carotid bruit or JVD.  Cardiovascular:     Rate and Rhythm: Normal rate and regular rhythm.     Heart sounds: Normal heart sounds.  Pulmonary:     Effort: Pulmonary effort is normal. No respiratory distress.     Breath sounds: Normal breath sounds. No wheezing or rales.  Chest:     Chest wall: No tenderness.  Abdominal:     General: Bowel sounds are normal. There is no distension or abdominal  bruit.     Palpations: Abdomen is soft. There is no hepatomegaly, splenomegaly, mass or pulsatile mass.     Tenderness: There is no abdominal tenderness.  Musculoskeletal:        General: Normal range of motion.     Cervical back: Normal range of motion and neck supple.     Comments: Walking with a cane Crepitus of left knee on flexion and extension  Lymphadenopathy:     Cervical: No cervical adenopathy.  Skin:    General: Skin is warm and dry.  Neurological:     Mental Status: She is alert and oriented to person, place, and time.     Deep Tendon Reflexes: Reflexes are normal and symmetric.  Psychiatric:        Behavior: Behavior normal.        Thought Content: Thought content normal.        Judgment: Judgment normal.    BP 114/61   Pulse 98   Temp (!) 97.4 F (36.3 C) (Temporal)   Resp 20   Ht 5' 4"$  (1.626 m)   Wt 198 lb (89.8 kg)   SpO2 97%   BMI 33.99 kg/m   Hgba1c 7.9      Assessment & Plan:   Alexandra Hunt comes in today with chief complaint of Medical Management of Chronic Issues   Diagnosis and orders addressed:  1. Essential hypertension, malignant Low sodium diet - Bayer DCA Hb A1c Waived - CBC with Differential/Platelet - CMP14+EGFR - furosemide (LASIX) 20 MG tablet; TAKE 1 TABLET BY MOUTH EVERY DAY  Dispense: 90 tablet; Refill: 1 - lisinopril (ZESTRIL) 20 MG tablet; Take 1 tablet (20 mg total) by mouth daily.  Dispense: 90 tablet; Refill: 1  2. Mixed hyperlipidemia Low fat diet - Lipid panel - fenofibrate 160 MG tablet; Take 1 tablet (160 mg total) by mouth daily.  Dispense: 90 tablet; Refill: 1  3. Uncontrolled type 2 diabetes mellitus with hyperglycemia (HCC) Continue to watch carbs in diet  4. Diabetic polyneuropathy associated with type 2 diabetes mellitus ([place]) Continue to see DR. Drake  5. Stress incontinence of urine Ref to urology - Ambulatory referral to Urology - tolterodine (DETROL LA) 4 MG 24 hr capsule; Take 1 capsule (4 mg  total) by mouth daily.  Dispense: 90 capsule; Refill: 1  6. Episode of recurrent major depressive disorder, unspecified depression episode severity ([employer]) Stress management - sertraline (ZOLOFT) 100 MG tablet; Take 2 tablets (200 mg total) by mouth daily.  Dispense: 180 tablet; Refill: 1  7. GAD (generalized anxiety disorder) - ToxASSURE Select 13 (MW), Urine - Drug Screen 10 W/Conf, Se - LORazepam (  ATIVAN) 1 MG tablet; Take 1 tablet (1 mg total) by mouth 3 (three) times daily.  Dispense: 90 tablet; Refill: 2  8. Osteopenia of lumbar spine Will do dexascan when can schedule  9. Low serum vitamin D Continue vitamin d supplement - VITAMIN D 25 Hydroxy (Vit-D Deficiency, Fractures)  10. BMI 32.0-32.9,adult Discussed diet and exercise for person with BMI >25 Will recheck weight in 3-6 months   11. Chronic pain of left knee - Ambulatory referral to Orthopedic Surgery  12. Skin lesion of chest wall - mupirocin cream (BACTROBAN) 2 %; Apply 1 Application topically 2 (two) times daily.  Dispense: 15 g; Refill: 0   Labs pending Health Maintenance reviewed Diet and exercise encouraged  Follow up plan: 3 months    Mary-Margaret Hassell Done, FNP

## 2022-04-10 LAB — CMP14+EGFR
ALT: 10 IU/L (ref 0–32)
AST: 17 IU/L (ref 0–40)
Albumin/Globulin Ratio: 1.5 (ref 1.2–2.2)
Albumin: 4.3 g/dL (ref 3.8–4.8)
Alkaline Phosphatase: 77 IU/L (ref 44–121)
BUN/Creatinine Ratio: 14 (ref 12–28)
BUN: 16 mg/dL (ref 8–27)
Bilirubin Total: 0.2 mg/dL (ref 0.0–1.2)
CO2: 22 mmol/L (ref 20–29)
Calcium: 9.4 mg/dL (ref 8.7–10.3)
Chloride: 101 mmol/L (ref 96–106)
Creatinine, Ser: 1.18 mg/dL — ABNORMAL HIGH (ref 0.57–1.00)
Globulin, Total: 2.8 g/dL (ref 1.5–4.5)
Glucose: 128 mg/dL — ABNORMAL HIGH (ref 70–99)
Potassium: 4.3 mmol/L (ref 3.5–5.2)
Sodium: 141 mmol/L (ref 134–144)
Total Protein: 7.1 g/dL (ref 6.0–8.5)
eGFR: 48 mL/min/{1.73_m2} — ABNORMAL LOW (ref >59)

## 2022-04-10 LAB — CBC WITH DIFFERENTIAL/PLATELET
Basophils Absolute: 0.1 10*3/uL (ref 0.0–0.2)
Basos: 0 %
EOS (ABSOLUTE): 0.2 10*3/uL (ref 0.0–0.4)
Eos: 1 %
Hematocrit: 40.3 % (ref 34.0–46.6)
Hemoglobin: 12.9 g/dL (ref 11.1–15.9)
Immature Grans (Abs): 0 10*3/uL (ref 0.0–0.1)
Immature Granulocytes: 0 %
Lymphocytes Absolute: 4.1 10*3/uL — ABNORMAL HIGH (ref 0.7–3.1)
Lymphs: 24 %
MCH: 24.7 pg — ABNORMAL LOW (ref 26.6–33.0)
MCHC: 32 g/dL (ref 31.5–35.7)
MCV: 77 fL — ABNORMAL LOW (ref 79–97)
Monocytes Absolute: 0.8 10*3/uL (ref 0.1–0.9)
Monocytes: 5 %
Neutrophils Absolute: 11.9 10*3/uL — ABNORMAL HIGH (ref 1.4–7.0)
Neutrophils: 70 %
Platelets: 456 10*3/uL — ABNORMAL HIGH (ref 150–450)
RBC: 5.22 x10E6/uL (ref 3.77–5.28)
RDW: 14.9 % (ref 11.7–15.4)
WBC: 17.1 10*3/uL — ABNORMAL HIGH (ref 3.4–10.8)

## 2022-04-10 LAB — LIPID PANEL
Chol/HDL Ratio: 3.9 ratio (ref 0.0–4.4)
Cholesterol, Total: 149 mg/dL (ref 100–199)
HDL: 38 mg/dL — ABNORMAL LOW (ref >39)
LDL Chol Calc (NIH): 79 mg/dL (ref 0–99)
Triglycerides: 189 mg/dL — ABNORMAL HIGH (ref 0–149)
VLDL Cholesterol Cal: 32 mg/dL (ref 5–40)

## 2022-04-10 LAB — VITAMIN D 25 HYDROXY (VIT D DEFICIENCY, FRACTURES): Vit D, 25-Hydroxy: 40.3 ng/mL (ref 30.0–100.0)

## 2022-04-12 ENCOUNTER — Telehealth: Payer: Self-pay

## 2022-04-12 NOTE — Telephone Encounter (Signed)
Patient in office for a visit and asked about assistance with medication. You last spoke with her on 02/04/22 and looks like application was submitted.  Patient wants to know if she can get an update. Please review and advise

## 2022-04-13 NOTE — Telephone Encounter (Signed)
Received notification from [place] regarding approval for [place]. Patient assistance approved until 03/01/23.  Medication will ship to office  Phone: [phone]

## 2022-04-15 NOTE — Telephone Encounter (Signed)
Left message informing patient her novo nordisk application was approved and medication will ship to office. Office staff will reach out to her once medication is ready for pickup.

## 2022-04-19 LAB — BENZODIAZEPINES,MS,WB/SP RFX
7-Aminoclonazepam: NEGATIVE ng/mL
Alprazolam: NEGATIVE ng/mL
Benzodiazepines Confirm: POSITIVE
Chlordiazepoxide: NEGATIVE
Clonazepam: NEGATIVE ng/mL
Desalkylflurazepam: NEGATIVE ng/mL
Desmethylchlordiazepoxide: NEGATIVE
Desmethyldiazepam: NEGATIVE ng/mL
Diazepam: NEGATIVE ng/mL
Flurazepam: NEGATIVE ng/mL
Lorazepam: 31 ng/mL
Midazolam: NEGATIVE ng/mL
Oxazepam: NEGATIVE ng/mL
Temazepam: NEGATIVE ng/mL
Triazolam: NEGATIVE ng/mL

## 2022-04-19 LAB — DRUG SCREEN 10 W/CONF, SERUM
Amphetamines, IA: NEGATIVE ng/mL
Barbiturates, IA: NEGATIVE ug/mL
Benzodiazepines, IA: POSITIVE ng/mL — AB
Cocaine & Metabolite, IA: NEGATIVE ng/mL
Methadone, IA: NEGATIVE ng/mL
Opiates, IA: NEGATIVE ng/mL
Oxycodones, IA: NEGATIVE ng/mL
Phencyclidine, IA: NEGATIVE ng/mL
Propoxyphene, IA: NEGATIVE ng/mL
THC(Marijuana) Metabolite, IA: NEGATIVE ng/mL

## 2022-04-29 ENCOUNTER — Telehealth: Payer: Self-pay

## 2022-04-29 NOTE — Telephone Encounter (Signed)
Received notification from [place] regarding approval for [place] Patient assistance approved from 04/13/22 to 03/01/23.  MEDICATION WILL SHIP TO OFFICE  Phone: [phone]

## 2022-04-30 DIAGNOSIS — M25562 Pain in left knee: Secondary | ICD-10-CM | POA: Diagnosis not present

## 2022-05-01 ENCOUNTER — Telehealth: Payer: Medicare HMO | Admitting: Nurse Practitioner

## 2022-05-01 ENCOUNTER — Other Ambulatory Visit: Payer: Self-pay | Admitting: Nurse Practitioner

## 2022-05-01 DIAGNOSIS — G5603 Carpal tunnel syndrome, bilateral upper limbs: Secondary | ICD-10-CM

## 2022-05-01 DIAGNOSIS — J0100 Acute maxillary sinusitis, unspecified: Secondary | ICD-10-CM

## 2022-05-01 DIAGNOSIS — B370 Candidal stomatitis: Secondary | ICD-10-CM

## 2022-05-01 MED ORDER — DOXYCYCLINE HYCLATE 100 MG PO TABS: 100.0000 mg | ORAL_TABLET | Freq: Two times a day (BID) | ORAL | 0 refills | Status: DC

## 2022-05-01 MED ORDER — NYSTATIN 100000 UNIT/ML MT SUSP: 5.0000 mL | Freq: Four times a day (QID) | OROMUCOSAL | 0 refills | Status: DC

## 2022-05-01 NOTE — Progress Notes (Signed)
E-Visit for Mouth Ulcers  We are sorry that you are not feeling well.  Here is how we plan to help!  Based on what you have shared with me, it appears that you do have mouth ulcer(s).     The following medications should decrease the discomfort and help with healing.    Ritta Slot which is a fungal  infection in the mouth.  I have prescribed nystatin liquid to swish and swallow 4x a day.  While the exact causes are unknown, some common causes and factors that may aggravate mouth ulcers include: Genetics - Sometimes mouth ulcers run in families High alcohol intake Acidic foods such as citrus fruits like pineapple, grapefruit, orange fruits/juices, may aggravate mouth ulcers Other foods high in acidity or spice such as coffee, chocolate, chips, pretzels, eggs, nuts, cheese Quitting smoking Injury caused by biting the tongue or inside of the cheek Diet lacking in 0000000, zinc, folic acid or iron Female hormone shifts with menstruation Excessive fatigue, emotional stress or anxiety Prevention: Talk to your doctor if you are taking meds that are known to cause mouth ulcers such as:   Anti-inflammatory drugs (for example Ibuprofen, Naproxen sodium), pain killers, Beta blockers, Oral nicotine replacement drugs, Some street drugs (heroin).   Avoid allowing any tablets to dissolve in your mouth that are meant to swallowed whole Avoid foods/drinks that trigger or worsen symptoms Keep your mouth clean with daily brushing and flossing  Home Care: The goal with treatment is to ease the pain where ulcers occur and help them heal as quickly as possible.  There is no medical treatment to prevent mouth ulcers from coming back or recurring.  Avoid spicy and acidic foods Eat soft foods and avoid rough, crunchy foods Avoid chewing gum Do not use toothpaste that contains sodium lauryl sulphite Use a straw to drink which helps avoid liquids toughing the ulcers near the front of your mouth Use a very soft  toothbrush If you have dentures or dental hardware that you feel is not fitting well or contributing to his, please see your dentist. Use saltwater mouthwash which helps healing. Dissolve a  teaspoon of salt in a glass of warm water. Swish around your mouth and spit it out. This can be used as needed if it is soothing.   GET HELP RIGHT AWAY IF: Persistent ulcers require checking IN PERSON (face to face). Any mouth lesion lasting longer than a month should be seen by your DENTIST as soon as possible for evaluation for possible oral cancer. If you have a non-painful ulcer in 1 or more areas of your mouth Ulcers that are spreading, are very large or particularly painful Ulcers last longer than one week without improving on treatment If you develop a fever, swollen glands and begin to feel unwell Ulcers that developed after starting a new medication MAKE SURE YOU: Understand these instructions. Will watch your condition. Will get help right away if you are not doing well or get worse.  Thank you for choosing an e-visit.  Your e-visit answers were reviewed by a board certified advanced clinical practitioner to complete your personal care plan. Depending upon the condition, your plan could have included both over the counter or prescription medications.  Please review your pharmacy choice. Make sure the pharmacy is open so you can pick up prescription now. If there is a problem, you may contact your provider through [employer] and have the prescription routed to another pharmacy.  Your safety is important to [place]. If  you have drug allergies check your prescription carefully.   For the next 24 hours you can use MyChart to ask questions about today's visit, request a non-urgent call back, or ask for a work or school excuse. You will get an email in the next two days asking about your experience. I hope that your e-visit has been valuable and will speed your recovery.  Mary-Margaret Hassell Done,  FNP   5-10 minutes spent reviewing and documenting in chart.

## 2022-05-01 NOTE — Progress Notes (Signed)
E-Visit for Sinus Problems  We are sorry that you are not feeling well.  Here is how we plan to help!  Based on what you have shared with me it looks like you have sinusitis.  Sinusitis is inflammation and infection in the sinus cavities of the head.  Based on your presentation I believe you most likely have Acute Bacterial Sinusitis.  This is an infection caused by bacteria and is treated with antibiotics. I have prescribed Doxycycline '100mg'$  by mouth twice a day for 10 days. You may use an oral decongestant such as Mucinex D or if you have glaucoma or high blood pressure use plain Mucinex. Saline nasal spray help and can safely be used as often as needed for congestion.  If you develop worsening sinus pain, fever or notice severe headache and vision changes, or if symptoms are not better after completion of antibiotic, please schedule an appointment with a health care provider.    Sinus infections are not as easily transmitted as other respiratory infection, however we still recommend that you avoid close contact with loved ones, especially the very young and elderly.  Remember to wash your hands thoroughly throughout the day as this is the number one way to prevent the spread of infection!  Home Care: Only take medications as instructed by your medical team. Complete the entire course of an antibiotic. Do not take these medications with alcohol. A steam or ultrasonic humidifier can help congestion.  You can place a towel over your head and breathe in the steam from hot water coming from a faucet. Avoid close contacts especially the very young and the elderly. Cover your mouth when you cough or sneeze. Always remember to wash your hands.  Get Help Right Away If: You develop worsening fever or sinus pain. You develop a severe head ache or visual changes. Your symptoms persist after you have completed your treatment plan.  Make sure you Understand these instructions. Will watch your  condition. Will get help right away if you are not doing well or get worse.  Thank you for choosing an e-visit.  Your e-visit answers were reviewed by a board certified advanced clinical practitioner to complete your personal care plan. Depending upon the condition, your plan could have included both over the counter or prescription medications.  Please review your pharmacy choice. Make sure the pharmacy is open so you can pick up prescription now. If there is a problem, you may contact your provider through [employer] and have the prescription routed to another pharmacy.  Your safety is important to [place]. If you have drug allergies check your prescription carefully.   For the next 24 hours you can use MyChart to ask questions about today's visit, request a non-urgent call back, or ask for a work or school excuse. You will get an email in the next two days asking about your experience. I hope that your e-visit has been valuable and will speed your recovery.  Alexandra Hunt Done, FNP   5-10 minutes spent reviewing and documenting in chart.

## 2022-05-07 DIAGNOSIS — M25562 Pain in left knee: Secondary | ICD-10-CM | POA: Diagnosis not present

## 2022-05-21 ENCOUNTER — Other Ambulatory Visit: Payer: Self-pay

## 2022-05-21 ENCOUNTER — Other Ambulatory Visit: Payer: Self-pay | Admitting: Nurse Practitioner

## 2022-05-21 MED ORDER — ALPHAGAN P 0.1 % OP SOLN
1.0000 [drp] | Freq: Two times a day (BID) | OPHTHALMIC | 0 refills | Status: DC
  Filled 2022-05-21: qty 15, 150d supply, fill #0

## 2022-05-24 ENCOUNTER — Ambulatory Visit (INDEPENDENT_AMBULATORY_CARE_PROVIDER_SITE_OTHER): Payer: Medicare HMO | Admitting: Urology

## 2022-05-24 ENCOUNTER — Encounter: Payer: Self-pay | Admitting: Urology

## 2022-05-24 VITALS — BP 102/59 | HR 73

## 2022-05-24 DIAGNOSIS — R32 Unspecified urinary incontinence: Secondary | ICD-10-CM | POA: Diagnosis not present

## 2022-05-24 DIAGNOSIS — N3000 Acute cystitis without hematuria: Secondary | ICD-10-CM

## 2022-05-24 MED ORDER — MIRABEGRON ER 25 MG PO TB24: 25.0000 mg | ORAL_TABLET | Freq: Every day | ORAL | 0 refills | Status: DC

## 2022-05-24 NOTE — Progress Notes (Signed)
05/24/2022 4:04 PM   Alexandra Hunt [birth year] [id number]  Referring provider: Chevis Pretty, [place],  [place] [address]  Urinary incontinence   HPI: Alexandra Hunt is a 77yo here for evaluation of urinary incontinence. For the past 2 years she has had worsening urinary incontinence. She has urinary urgency and daily urge incontinence. She uses 5+ pads per day.  She has new dysuria and pelvic pain. She has DMII and A1c is 7.1. She is on farxiga.  She has peripheral neuropathy. No history of pelvic radiation. She previously tried detrol which failed to improve her LUTS.    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[place])    pt not taking her meds  (pt states this is an inaccurate diagnosis and wants it removed) 03-15-2018   Skin cancer    on arms/right hand   Sleep apnea     Surgical History: Past Surgical History:  Procedure Laterality Date   ABDOMINAL HYSTERECTOMY     due to uterine cancer   BREAST SURGERY     left breast biopsy/benign   TUBAL LIGATION      Home Medications:  Allergies as of 05/24/2022       Reactions   Penicillins    SOB,rash   Ivp Dye [iodinated Contrast Media]    Burning at IV site   Statins    myalgias   Codeine    nauseated   Latex    rash   Sulfa Antibiotics    nauseated        Medication List        Accurate as of May 24, 2022  4:04 PM. If you have any questions, ask your nurse or doctor.          albuterol 108 (90 Base) MCG/ACT inhaler Commonly known as: VENTOLIN HFA Inhale 2 puffs into the lungs every 6  (six) hours as needed for wheezing or shortness of breath.   Alphagan P 0.1 % Soln Generic drug: brimonidine Apply 1 drop to eye 2 (two) times daily.   aspirin 81 MG tablet Take 81 mg by mouth daily.   B-D SINGLE USE SWABS REGULAR Pads TEST BS UP TO FOUR TIMES DAILY Dx E11.9   B-D UF III MINI PEN NEEDLES 31G X 5 MM Misc Generic drug: Insulin Pen Needle USE WITH SLIDING SCALE HUMALOG 4 TIMES DAILY DX: E11.9   [facility] Lubricant Eye Drops 0.6 % Soln Generic drug: Propylene Glycol Apply 1 drop to eye 2 (two) times daily.   dapagliflozin propanediol 5 MG Tabs tablet Commonly known as: Farxiga Take 1 tablet (5 mg total) by mouth daily.   diclofenac 75 MG EC tablet Commonly known as: VOLTAREN TAKE 1 TABLET BY MOUTH TWICE A DAY AS NEEDED   dorzolamide 2 % ophthalmic solution Commonly known as: TRUSOPT Place 2 drops into both eyes 2 (two) times daily.   doxycycline 100 MG tablet Commonly known as: VIBRA-TABS Take 1 tablet (100 mg total) by mouth  2 (two) times daily. 1 po bid   fenofibrate 160 MG tablet Take 1 tablet (160 mg total) by mouth daily.   fluticasone 50 MCG/ACT nasal spray Commonly known as: FLONASE SPRAY 2 SPRAYS INTO EACH NOSTRIL EVERY DAY   FreeStyle Libre 14 Day Sensor Misc Change every 14 days as directed Dx E11.9   FreeStyle Libre 2 Reader Devi Use to test blood sugar 4-6 times daily as directed DX: E11.9   furosemide 20 MG tablet Commonly known as: LASIX TAKE 1 TABLET BY MOUTH EVERY DAY   gabapentin 100 MG capsule Commonly known as: NEURONTIN Take 1 capsule (100mg ) by mouth in the morning, 1 capsule (100mg ) in the afternoon, & 2 capsules (200mg ) in the evening   ibuprofen 800 MG tablet Commonly known as: ADVIL TAKE 1 TABLET BY MOUTH EVERY 8 HOURS AS NEEDED   insulin regular 100 units/mL injection Commonly known as: NovoLIN R INJECT 22UNITS 4 TIMES A DAY PER SLIDING SCALE   Insulin Syringe-Needle U-100 31G X 5/16" 1 ML Misc Commonly known as:  TRUEplus Insulin Syringe Use daily with Lantus & Humalog insulin sliding scales as instructed Dx E11.9   lidocaine 5 % Commonly known as: Lidoderm Place 1 patch onto the skin daily. Remove & Discard patch within 12 hours or as directed by MD   lisinopril 20 MG tablet Commonly known as: ZESTRIL Take 1 tablet (20 mg total) by mouth daily.   LORazepam 1 MG tablet Commonly known as: ATIVAN Take 1 tablet (1 mg total) by mouth 3 (three) times daily.   magic mouthwash Soln Take 5 mLs by mouth 4 (four) times daily.   meclizine 25 MG tablet Commonly known as: ANTIVERT Take 1 tablet (25 mg total) by mouth 3 (three) times daily as needed for dizziness.   meloxicam 15 MG tablet Commonly known as: MOBIC Take 15 mg by mouth daily.   mupirocin cream 2 % Commonly known as: Bactroban Apply 1 Application topically 2 (two) times daily.   nystatin 100000 UNIT/ML suspension Commonly known as: MYCOSTATIN Take 5 mLs (500,000 Units total) by mouth 4 (four) times daily.   ondansetron 4 MG tablet Commonly known as: ZOFRAN   promethazine-dextromethorphan 6.25-15 MG/5ML syrup Commonly known as: PROMETHAZINE-DM Take 5 mLs by mouth 4 (four) times daily as needed for cough.   Semaglutide(0.25 or 0.5MG /DOS) 2 MG/3ML Sopn Inject 0.25 mg into the skin once a week. X4 weeks then increase to 0.5mg  sq weekly.  Start after surgery on 11/04/21. DX: E11.65   sertraline 100 MG tablet Commonly known as: ZOLOFT Take 2 tablets (200 mg total) by mouth daily.   silver sulfADIAZINE 1 % cream Commonly known as: SILVADENE Apply 1 application topically 2 (two) times daily.   tolterodine 4 MG 24 hr capsule Commonly known as: Detrol LA Take 1 capsule (4 mg total) by mouth daily.   Toujeo SoloStar 300 UNIT/ML Solostar Pen Generic drug: insulin glargine (1 Unit Dial) Inject 90 Units into the skin in the morning and at bedtime.   traMADol 50 MG tablet Commonly known as: ULTRAM Take 50 mg by mouth.   True  Metrix Blood Glucose Test test strip Generic drug: glucose blood TEST BS UP TO FOUR TIMES DAILY Dx E11.9   True Metrix Level 1 Low Soln Use with glucose machine Dx E11.9,   True Metrix Meter w/Device Kit TEST BS UP TO FOUR TIMES DAILY Dx E11.9   TRUEdraw Lancing Device Misc TEST BS UP TO FOUR TIMES DAILY Dx E11.9  TRUEplus Lancets 33G Misc TEST BS UP TO FOUR TIMES DAILY Dx E11.9   Vitamin D (Ergocalciferol) 1.25 MG (50000 UNIT) Caps capsule Commonly known as: DRISDOL TAKE 1 CAPSULE EVERY 7 DAYS        Allergies:  Allergies  Allergen Reactions   Penicillins     SOB,rash   Ivp Dye [Iodinated Contrast Media]     Burning at IV site   Statins     myalgias   Codeine     nauseated   Latex     rash   Sulfa Antibiotics     nauseated    Family History: Family History  Problem Relation Age of Onset   Diabetes Mother    Blindness Mother        related to diabetes   Stroke Mother    Heart disease Father        MI   Heart attack Father    Heart defect Father    Breast cancer Sister    Diabetes Brother        diet controlled   Colon cancer Neg Hx    Esophageal cancer Neg Hx    Rectal cancer Neg Hx    Stomach cancer Neg Hx     Social History:  reports that she has never smoked. She has never been exposed to tobacco smoke. She has never used smokeless tobacco. She reports that she does not drink alcohol and does not use drugs.  ROS: All other review of systems were reviewed and are negative except what is noted above in HPI  Physical Exam: BP (!) 102/59   Pulse 73   Constitutional:  Alert and oriented, No acute distress. HEENT: [place] AT, moist mucus membranes.  Trachea midline, no masses. Cardiovascular: No clubbing, cyanosis, or edema. Respiratory: Normal respiratory effort, no increased work of breathing. GI: Abdomen is soft, nontender, nondistended, no abdominal masses GU: No CVA tenderness.  Lymph: No cervical or inguinal lymphadenopathy. Skin: No rashes,  bruises or suspicious lesions. Neurologic: Grossly intact, no focal deficits, moving all 4 extremities. Psychiatric: Normal mood and affect.  Laboratory Data: Lab Results  Component Value Date   WBC 17.1 (H) 04/09/2022   HGB 12.9 04/09/2022   HCT 40.3 04/09/2022   MCV 77 (L) 04/09/2022   PLT 456 (H) 04/09/2022    Lab Results  Component Value Date   CREATININE 1.18 (H) 04/09/2022    No results found for: "PSA"  No results found for: "TESTOSTERONE"  Lab Results  Component Value Date   HGBA1C 7.9 (H) 04/09/2022    Urinalysis    Component Value Date/Time   APPEARANCEUR Clear 05/21/2021 0846   GLUCOSEU 3+ (A) 05/21/2021 0846   BILIRUBINUR Negative 05/21/2021 0846   PROTEINUR Negative 05/21/2021 0846   UROBILINOGEN negative 03/14/2015 1647   NITRITE Negative 05/21/2021 0846   LEUKOCYTESUR Negative 05/21/2021 0846    Lab Results  Component Value Date   LABMICR 12.9 12/28/2021   MUCUS neg 03/14/2015   BACTERIA neg 03/14/2015    Pertinent Imaging:  No results found for this or any previous visit.  No results found for this or any previous visit.  No results found for this or any previous visit.  No results found for this or any previous visit.  No results found for this or any previous visit.  No valid procedures specified. No results found for this or any previous visit.  No results found for this or any previous visit.   Assessment & Plan:  1. Urinary incontinence, unspecified type -We will trial mirabegron 25mg  daily. If this fails to improve her incontinence we will trial gemtesa.  - Urinalysis, Routine w reflex microscopic - BLADDER SCAN AMB NON-IMAGING   No follow-ups on file.  Nicolette Bang, MD  [facility]

## 2022-05-24 NOTE — Progress Notes (Signed)
post void residual=0 ?

## 2022-05-24 NOTE — Patient Instructions (Signed)

## 2022-05-24 NOTE — Addendum Note (Signed)
Addended by: Cleon Gustin on: 05/24/2022 04:12 PM   Modules accepted: Orders

## 2022-05-25 ENCOUNTER — Other Ambulatory Visit: Payer: Self-pay | Admitting: Nurse Practitioner

## 2022-05-25 LAB — MICROSCOPIC EXAMINATION: Epithelial Cells (non renal): >10 /hpf — AB (ref 0–10)

## 2022-05-25 LAB — URINALYSIS, ROUTINE W REFLEX MICROSCOPIC
Bilirubin, UA: NEGATIVE
Ketones, UA: NEGATIVE
Leukocytes,UA: NEGATIVE
Nitrite, UA: NEGATIVE
Protein,UA: NEGATIVE
Specific Gravity, UA: <1.005 — ABNORMAL LOW (ref 1.005–1.030)
Urobilinogen, Ur: 0.2 mg/dL (ref 0.2–1.0)
pH, UA: 5 (ref 5.0–7.5)

## 2022-05-27 ENCOUNTER — Telehealth: Payer: Self-pay

## 2022-05-27 LAB — URINE CULTURE

## 2022-05-27 MED ORDER — NITROFURANTOIN MONOHYD MACRO 100 MG PO CAPS: 100.0000 mg | ORAL_CAPSULE | Freq: Two times a day (BID) | ORAL | 0 refills | Status: DC

## 2022-05-27 NOTE — Telephone Encounter (Signed)
Rx sent. Patient called and made aware. 

## 2022-05-28 ENCOUNTER — Other Ambulatory Visit: Payer: Self-pay

## 2022-05-31 ENCOUNTER — Other Ambulatory Visit: Payer: Self-pay | Admitting: Nurse Practitioner

## 2022-05-31 DIAGNOSIS — M25562 Pain in left knee: Secondary | ICD-10-CM | POA: Diagnosis not present

## 2022-05-31 MED ORDER — ALPHAGAN P 0.1 % OP SOLN: 1.0000 [drp] | Freq: Two times a day (BID) | OPHTHALMIC | 0 refills | Status: DC

## 2022-05-31 MED ORDER — NYSTATIN 100000 UNIT/ML MT SUSP: 5.0000 mL | Freq: Four times a day (QID) | OROMUCOSAL | 0 refills | Status: DC

## 2022-05-31 NOTE — Telephone Encounter (Signed)
From: [name] To: Office of [facility] [place], [place] Sent: 05/28/2022 2:45 PM EDT Subject: Medication Renewal Request  Refills have been requested for the following medications:   nystatin (MYCOSTATIN) 100000 UNIT/ML suspension [Mary-Margaret Jermani Pund]  [facility]: [id number] [place], [place] Delivery method: [employer]

## 2022-06-07 ENCOUNTER — Other Ambulatory Visit: Payer: Self-pay | Admitting: Nurse Practitioner

## 2022-06-07 DIAGNOSIS — E1165 Type 2 diabetes mellitus with hyperglycemia: Secondary | ICD-10-CM

## 2022-06-07 NOTE — Telephone Encounter (Signed)
Patient would like to speak to Alexandra Hunt about her medications. She is confused about her insulin. Please call back.

## 2022-06-07 NOTE — Telephone Encounter (Signed)
Attempted to contact patient. Please review and advise

## 2022-06-08 NOTE — Telephone Encounter (Signed)
Can we check on tresiba and ozempic shipment? Samples given to bridge patient

## 2022-06-11 ENCOUNTER — Other Ambulatory Visit: Payer: Self-pay | Admitting: Nurse Practitioner

## 2022-06-11 DIAGNOSIS — L989 Disorder of the skin and subcutaneous tissue, unspecified: Secondary | ICD-10-CM

## 2022-06-14 DIAGNOSIS — E1165 Type 2 diabetes mellitus with hyperglycemia: Secondary | ICD-10-CM | POA: Diagnosis not present

## 2022-06-16 DIAGNOSIS — M25562 Pain in left knee: Secondary | ICD-10-CM | POA: Diagnosis not present

## 2022-06-23 ENCOUNTER — Telehealth: Payer: Self-pay | Admitting: Family Medicine

## 2022-06-25 ENCOUNTER — Other Ambulatory Visit: Payer: Self-pay | Admitting: Urology

## 2022-06-25 ENCOUNTER — Other Ambulatory Visit: Payer: Self-pay

## 2022-06-25 DIAGNOSIS — R32 Unspecified urinary incontinence: Secondary | ICD-10-CM

## 2022-06-25 MED ORDER — MIRABEGRON ER 25 MG PO TB24
25.0000 mg | ORAL_TABLET | Freq: Every day | ORAL | 0 refills | Status: DC
Start: 2022-06-25 — End: 2022-06-28

## 2022-06-25 NOTE — Telephone Encounter (Addendum)
Left voice message for patient to return call. Samples up front.

## 2022-06-28 ENCOUNTER — Telehealth: Payer: Self-pay

## 2022-06-28 ENCOUNTER — Other Ambulatory Visit: Payer: Self-pay

## 2022-06-28 DIAGNOSIS — R32 Unspecified urinary incontinence: Secondary | ICD-10-CM

## 2022-06-28 MED ORDER — MIRABEGRON ER 25 MG PO TB24
25.0000 mg | ORAL_TABLET | Freq: Every day | ORAL | 0 refills | Status: DC
Start: 2022-06-28 — End: 2022-07-07

## 2022-06-28 NOTE — Telephone Encounter (Signed)
Return call to patient. Patient states that she wants a Rx for mirabegron ER 25 mg  sent ot her pharmacy. Patient is aware that Rx mirabegron was sent to her pharmacy. Patient voiced understanding

## 2022-06-28 NOTE — Telephone Encounter (Signed)
Patient is out of mirabegron ER 25 MG Tb24 tablet. Rx was sent to wrong pharmacy.  Medication needs to be called into:  [id number] [place], [place] - [address] Phone: [phone]  Fax: [phone]

## 2022-07-06 ENCOUNTER — Telehealth: Payer: Self-pay | Admitting: Nurse Practitioner

## 2022-07-07 ENCOUNTER — Telehealth: Payer: Self-pay

## 2022-07-07 DIAGNOSIS — R32 Unspecified urinary incontinence: Secondary | ICD-10-CM

## 2022-07-07 MED ORDER — MIRABEGRON ER 50 MG PO TB24
50.0000 mg | ORAL_TABLET | Freq: Every day | ORAL | 4 refills | Status: DC
Start: 2022-07-07 — End: 2022-07-20

## 2022-07-07 NOTE — Telephone Encounter (Signed)
Return call to patient. Patient states that she is using 8 pad a day and would like for her Myrbetriq to be increase due to urine frequency. Verbal given from Dr. Ronne Binning to increase Myrebetriq from 25 mg to 50 mg. Rx sent to pharmacy. Patient voiced understanding

## 2022-07-07 NOTE — Telephone Encounter (Signed)
Appt has been rescheduled

## 2022-07-08 ENCOUNTER — Other Ambulatory Visit: Payer: Medicare HMO

## 2022-07-08 ENCOUNTER — Ambulatory Visit: Payer: Medicare HMO | Admitting: Nurse Practitioner

## 2022-07-08 DIAGNOSIS — M1712 Unilateral primary osteoarthritis, left knee: Secondary | ICD-10-CM | POA: Diagnosis not present

## 2022-07-08 DIAGNOSIS — M1711 Unilateral primary osteoarthritis, right knee: Secondary | ICD-10-CM | POA: Diagnosis not present

## 2022-07-12 ENCOUNTER — Ambulatory Visit: Payer: Medicare HMO | Admitting: Urology

## 2022-07-19 NOTE — Telephone Encounter (Signed)
Erroneous encounter will close.

## 2022-07-20 ENCOUNTER — Ambulatory Visit (INDEPENDENT_AMBULATORY_CARE_PROVIDER_SITE_OTHER): Payer: Medicare HMO

## 2022-07-20 ENCOUNTER — Encounter: Payer: Self-pay | Admitting: Nurse Practitioner

## 2022-07-20 ENCOUNTER — Ambulatory Visit (INDEPENDENT_AMBULATORY_CARE_PROVIDER_SITE_OTHER): Payer: Medicare HMO | Admitting: Nurse Practitioner

## 2022-07-20 VITALS — BP 127/73 | HR 73 | Temp 97.2°F | Resp 20 | Ht 64.0 in | Wt 195.0 lb

## 2022-07-20 DIAGNOSIS — G4733 Obstructive sleep apnea (adult) (pediatric): Secondary | ICD-10-CM

## 2022-07-20 DIAGNOSIS — I10 Essential (primary) hypertension: Secondary | ICD-10-CM | POA: Diagnosis not present

## 2022-07-20 DIAGNOSIS — E1165 Type 2 diabetes mellitus with hyperglycemia: Secondary | ICD-10-CM

## 2022-07-20 DIAGNOSIS — F339 Major depressive disorder, recurrent, unspecified: Secondary | ICD-10-CM

## 2022-07-20 DIAGNOSIS — M8588 Other specified disorders of bone density and structure, other site: Secondary | ICD-10-CM

## 2022-07-20 DIAGNOSIS — F411 Generalized anxiety disorder: Secondary | ICD-10-CM

## 2022-07-20 DIAGNOSIS — N393 Stress incontinence (female) (male): Secondary | ICD-10-CM | POA: Diagnosis not present

## 2022-07-20 DIAGNOSIS — E782 Mixed hyperlipidemia: Secondary | ICD-10-CM

## 2022-07-20 DIAGNOSIS — Z780 Asymptomatic menopausal state: Secondary | ICD-10-CM | POA: Diagnosis not present

## 2022-07-20 DIAGNOSIS — E1169 Type 2 diabetes mellitus with other specified complication: Secondary | ICD-10-CM | POA: Diagnosis not present

## 2022-07-20 DIAGNOSIS — E785 Hyperlipidemia, unspecified: Secondary | ICD-10-CM | POA: Diagnosis not present

## 2022-07-20 DIAGNOSIS — R7989 Other specified abnormal findings of blood chemistry: Secondary | ICD-10-CM | POA: Diagnosis not present

## 2022-07-20 DIAGNOSIS — Z6832 Body mass index (BMI) 32.0-32.9, adult: Secondary | ICD-10-CM

## 2022-07-20 DIAGNOSIS — R32 Unspecified urinary incontinence: Secondary | ICD-10-CM

## 2022-07-20 DIAGNOSIS — E1142 Type 2 diabetes mellitus with diabetic polyneuropathy: Secondary | ICD-10-CM | POA: Diagnosis not present

## 2022-07-20 LAB — BAYER DCA HB A1C WAIVED: HB A1C (BAYER DCA - WAIVED): 6.6 % — ABNORMAL HIGH (ref 4.8–5.6)

## 2022-07-20 MED ORDER — MIRABEGRON ER 50 MG PO TB24
50.0000 mg | ORAL_TABLET | Freq: Every day | ORAL | 4 refills | Status: DC
Start: 2022-07-20 — End: 2022-11-11

## 2022-07-20 MED ORDER — LISINOPRIL 20 MG PO TABS: 20.0000 mg | ORAL_TABLET | Freq: Every day | ORAL | 1 refills | Status: DC

## 2022-07-20 MED ORDER — SERTRALINE HCL 100 MG PO TABS
200.0000 mg | ORAL_TABLET | Freq: Every day | ORAL | 1 refills | Status: DC
Start: 2022-07-20 — End: 2022-11-11

## 2022-07-20 MED ORDER — LORAZEPAM 1 MG PO TABS: 1.0000 mg | ORAL_TABLET | Freq: Three times a day (TID) | ORAL | 2 refills | Status: DC

## 2022-07-20 MED ORDER — GABAPENTIN 100 MG PO CAPS
ORAL_CAPSULE | ORAL | 1 refills | Status: DC
Start: 2022-07-20 — End: 2022-11-11

## 2022-07-20 MED ORDER — DAPAGLIFLOZIN PROPANEDIOL 5 MG PO TABS
5.0000 mg | ORAL_TABLET | Freq: Every day | ORAL | 1 refills | Status: DC
Start: 2022-07-20 — End: 2022-11-11

## 2022-07-20 MED ORDER — SEMAGLUTIDE(0.25 OR 0.5MG/DOS) 2 MG/3ML ~~LOC~~ SOPN
0.2500 mg | PEN_INJECTOR | SUBCUTANEOUS | 2 refills | Status: DC
Start: 2022-07-20 — End: 2022-11-11

## 2022-07-20 MED ORDER — FUROSEMIDE 20 MG PO TABS
ORAL_TABLET | ORAL | 1 refills | Status: DC
Start: 2022-07-20 — End: 2022-11-11

## 2022-07-20 MED ORDER — FENOFIBRATE 160 MG PO TABS
160.0000 mg | ORAL_TABLET | Freq: Every day | ORAL | 1 refills | Status: DC
Start: 2022-07-20 — End: 2022-11-11

## 2022-07-20 NOTE — Patient Instructions (Signed)

## 2022-07-20 NOTE — Progress Notes (Signed)
Subjective:    Patient ID: Alexandra Hunt, female    DOB: 01/04/1946, 77 y.o.   MRN: [id number]   Chief Complaint: medical management of chronic issues     HPI:  Alexandra Hunt is a 77 y.o. who identifies as a female who was assigned female at birth.   Social history: Lives with: childeren and grandchildren Work history: disability   Comes in today for follow up of the following chronic medical issues:  1. Essential hypertension, malignant No c/o chest pain, sob or headache. Does not check blood pressure at home. BP Readings from Last 3 Encounters:  05/24/22 (!) 102/59  04/09/22 114/61  12/28/21 113/63     2. Hyperlipidemia associated with type 2 diabetes mellitus (HCC) Does not watch diet and does no exercise. Lab Results  Component Value Date   CHOL 149 04/09/2022   HDL 38 (L) 04/09/2022   LDLCALC 79 04/09/2022   TRIG 189 (H) 04/09/2022   CHOLHDL 3.9 04/09/2022      3. Uncontrolled type 2 diabetes mellitus with hyperglycemia (HCC) Fasting blood sugars are all over the place. They are always going up and down. Lab Results  Component Value Date   HGBA1C 7.9 (H) 04/09/2022     4. Diabetic polyneuropathy associated with type 2 diabetes mellitus (HCC) No sores on feet but does have some pain and burning in bil feet. Is on neurontin and is doing well.  5. Obstructive sleep apnea syndrome Does not wear cpap. Says she needs to pick it up  6. Episode of recurrent major depressive disorder, unspecified depression episode severity (HCC) Has been on zoloft for years. Is doing ok. Her living situation really affects her mood. She wants her kids and grandkids to find their own place to live but she will not tell them that.     04/09/2022    3:58 PM 03/24/2022    3:55 PM 12/28/2021    3:48 PM  Depression screen PHQ 2/9  Decreased Interest 3 0 0  Down, Depressed, Hopeless 2 0 1  PHQ - 2 Score 5 0 1  Altered sleeping 2  2  Tired, decreased energy 2  1  Change in  appetite 2  1  Feeling bad or failure about yourself  2  1  Trouble concentrating 0  1  Moving slowly or fidgety/restless 0  0  Suicidal thoughts 0  0  PHQ-9 Score 13  7  Difficult doing work/chores Somewhat difficult  Not difficult at all     7. GAD (generalized anxiety disorder) Is on ativan bind and still stays stressed. Again do to her living situation.    04/09/2022    3:58 PM 12/28/2021    3:49 PM 05/08/2021   10:15 AM 03/18/2021    2:30 PM  GAD 7 : Generalized Anxiety Score  Nervous, Anxious, on Edge 2 2 2 2   Control/stop worrying 2 2 2 2   Worry too much - different things 2 2 2 2   Trouble relaxing 1 1 1 1   Restless 1 1 1 1   Easily annoyed or irritable 0 0 0 0  Afraid - awful might happen 1 1 1 1   Total GAD 7 Score 9 9 9 9   Anxiety Difficulty Somewhat difficult Somewhat difficult Somewhat difficult Somewhat difficult     8. Low serum vitamin D Takes vitamin d supplement when she remebers to take it. Last vitamin D Lab Results  Component Value Date   VD25OH 40.3 04/09/2022  9. Stress incontinence of urine On myrbetriq daily and is doing well.  10. Osteopenia of lumbar spine Last dexascan was done on . Is scheduled for repeat dexscan today at 430  11. BMI 32.0-32.9,adult No recent weight changes Wt Readings from Last 3 Encounters:  07/20/22 195 lb (88.5 kg)  04/09/22 198 lb (89.8 kg)  03/24/22 192 lb (87.1 kg)   BMI Readings from Last 3 Encounters:  07/20/22 33.47 kg/m  04/09/22 33.99 kg/m  03/24/22 32.96 kg/m     New complaints: N/a  Allergies  Allergen Reactions   Penicillins     SOB,rash   Ivp Dye [Iodinated Contrast Media]     Burning at IV site   Statins     myalgias   Codeine     nauseated   Latex     rash   Sulfa Antibiotics     nauseated   Outpatient Encounter Medications as of 07/20/2022  Medication Sig   albuterol (VENTOLIN HFA) 108 (90 Base) MCG/ACT inhaler Inhale 2 puffs into the lungs every 6 (six) hours as needed for  wheezing or shortness of breath.   Alcohol Swabs (B-D SINGLE USE SWABS REGULAR) PADS TEST BS UP TO FOUR TIMES DAILY Dx E11.9   ALPHAGAN P 0.1 % SOLN Apply 1 drop to eye 2 (two) times daily.   aspirin 81 MG tablet Take 81 mg by mouth daily.   Blood Glucose Calibration (TRUE METRIX LEVEL 1) Low SOLN Use with glucose machine Dx E11.9,   Blood Glucose Monitoring Suppl (TRUE METRIX METER) w/Device KIT TEST BS UP TO FOUR TIMES DAILY Dx E11.9   Continuous Blood Gluc Receiver (FREESTYLE LIBRE 2 READER) DEVI Use to test blood sugar 4-6 times daily as directed DX: E11.9   [facility] LUBRICANT EYE DROPS 0.6 % SOLN Apply 1 drop to eye 2 (two) times daily.   dapagliflozin propanediol (FARXIGA) 5 MG TABS tablet Take 1 tablet (5 mg total) by mouth daily.   diclofenac (VOLTAREN) 75 MG EC tablet TAKE 1 TABLET BY MOUTH TWICE A DAY AS NEEDED   dorzolamide (TRUSOPT) 2 % ophthalmic solution Place 2 drops into both eyes 2 (two) times daily.   fenofibrate 160 MG tablet Take 1 tablet (160 mg total) by mouth daily.   fluticasone (FLONASE) 50 MCG/ACT nasal spray SPRAY 2 SPRAYS INTO EACH NOSTRIL EVERY DAY   furosemide (LASIX) 20 MG tablet TAKE 1 TABLET BY MOUTH EVERY DAY   gabapentin (NEURONTIN) 100 MG capsule Take 1 capsule (100mg ) by mouth in the morning, 1 capsule (100mg ) in the afternoon, & 2 capsules (200mg ) in the evening   glucose blood (TRUE METRIX BLOOD GLUCOSE TEST) test strip TEST BS UP TO FOUR TIMES DAILY Dx E11.9   ibuprofen (ADVIL) 800 MG tablet TAKE 1 TABLET BY MOUTH EVERY 8 HOURS AS NEEDED   insulin degludec (TRESIBA FLEXTOUCH) 200 UNIT/ML FlexTouch Pen Inject 90 Units into the skin 2 (two) times daily. Gets via novo nordisk patient assistance   Insulin Pen Needle (B-D UF III MINI PEN NEEDLES) 31G X 5 MM MISC USE WITH SLIDING SCALE HUMALOG 4 TIMES DAILY Dx E11.9   insulin regular (NOVOLIN R) 100 units/mL injection INJECT 22UNITS 4 TIMES A DAY PER SLIDING SCALE   Insulin Syringe-Needle U-100 (BD INSULIN SYRINGE  U/F) 31G X 5/16" 1 ML MISC UAD w/ Lantus and Humalog insulin sliding scale Dx E11.9   Lancet Devices (TRUEDRAW LANCING DEVICE) MISC TEST BS UP TO FOUR TIMES DAILY Dx E11.9   lisinopril (ZESTRIL) 20  MG tablet Take 1 tablet (20 mg total) by mouth daily.   LORazepam (ATIVAN) 1 MG tablet Take 1 tablet (1 mg total) by mouth 3 (three) times daily.   magic mouthwash SOLN Take 5 mLs by mouth 4 (four) times daily.   meclizine (ANTIVERT) 25 MG tablet Take 1 tablet (25 mg total) by mouth 3 (three) times daily as needed for dizziness.   mirabegron ER (MYRBETRIQ) 50 MG TB24 tablet Take 1 tablet (50 mg total) by mouth daily.   mupirocin cream (BACTROBAN) 2 % APPLY TO AFFECTED AREA TWICE A DAY   nitrofurantoin, macrocrystal-monohydrate, (MACROBID) 100 MG capsule Take 1 capsule (100 mg total) by mouth 2 (two) times daily.   nystatin (MYCOSTATIN) 100000 UNIT/ML suspension Take 5 mLs (500,000 Units total) by mouth 4 (four) times daily.   ondansetron (ZOFRAN) 4 MG tablet    Semaglutide,0.25 or 0.5MG /DOS, 2 MG/3ML SOPN Inject 0.25 mg into the skin once a week. X4 weeks then increase to 0.5mg  sq weekly.  Start after surgery on 11/04/21. DX: E11.65   sertraline (ZOLOFT) 100 MG tablet Take 2 tablets (200 mg total) by mouth daily.   TRUEplus Lancets 33G MISC TEST BS UP TO FOUR TIMES DAILY Dx E11.9   Vitamin D, Ergocalciferol, (DRISDOL) 1.25 MG (50000 UNIT) CAPS capsule TAKE 1 CAPSULE EVERY 7 DAYS   No facility-administered encounter medications on file as of 07/20/2022.    Past Surgical History:  Procedure Laterality Date   ABDOMINAL HYSTERECTOMY     due to uterine cancer   BREAST SURGERY     left breast biopsy/benign   TUBAL LIGATION      Family History  Problem Relation Age of Onset   Diabetes Mother    Blindness Mother        related to diabetes   Stroke Mother    Heart disease Father        MI   Heart attack Father    Heart defect Father    Breast cancer Sister    Diabetes Brother        diet  controlled   Colon cancer Neg Hx    Esophageal cancer Neg Hx    Rectal cancer Neg Hx    Stomach cancer Neg Hx        Review of Systems  Constitutional:  Negative for diaphoresis.  Eyes:  Negative for pain.  Respiratory:  Negative for shortness of breath.   Cardiovascular:  Negative for chest pain, palpitations and leg swelling.  Gastrointestinal:  Negative for abdominal pain.  Endocrine: Negative for polydipsia.  Skin:  Negative for rash.  Neurological:  Negative for dizziness, weakness and headaches.  Hematological:  Does not bruise/bleed easily.  All other systems reviewed and are negative.      Objective:   Physical Exam Vitals and nursing note reviewed.  Constitutional:      General: She is not in acute distress.    Appearance: Normal appearance. She is well-developed.  HENT:     Head: Normocephalic.     Right Ear: Tympanic membrane normal.     Left Ear: Tympanic membrane normal.     Nose: Nose normal.     Mouth/Throat:     Mouth: Mucous membranes are moist.  Eyes:     Pupils: Pupils are equal, round, and reactive to light.  Neck:     Vascular: No carotid bruit or JVD.  Cardiovascular:     Rate and Rhythm: Normal rate and regular rhythm.     Heart  sounds: Normal heart sounds.  Pulmonary:     Effort: Pulmonary effort is normal. No respiratory distress.     Breath sounds: Normal breath sounds. No wheezing or rales.  Chest:     Chest wall: No tenderness.  Abdominal:     General: Bowel sounds are normal. There is no distension or abdominal bruit.     Palpations: Abdomen is soft. There is no hepatomegaly, splenomegaly, mass or pulsatile mass.     Tenderness: There is no abdominal tenderness.  Musculoskeletal:        General: Normal range of motion.     Cervical back: Normal range of motion and neck supple.  Lymphadenopathy:     Cervical: No cervical adenopathy.  Skin:    General: Skin is warm and dry.  Neurological:     Mental Status: She is alert and  oriented to person, place, and time.     Deep Tendon Reflexes: Reflexes are normal and symmetric.  Psychiatric:        Behavior: Behavior normal.        Thought Content: Thought content normal.        Judgment: Judgment normal.    BP 127/73   Pulse 73   Temp (!) 97.2 F (36.2 C) (Temporal)   Resp 20   Ht 5\' 4"  (1.626 m)   Wt 195 lb (88.5 kg)   SpO2 95%   BMI 33.47 kg/m   HGBA1c 6.6       Assessment & Plan:   Alexandra Hunt comes in today with chief complaint of Medical Management of Chronic Issues   Diagnosis and orders addressed:  1. Essential hypertension, malignant Low sodium diet - CBC with Differential/Platelet - CMP14+EGFR - furosemide (LASIX) 20 MG tablet; TAKE 1 TABLET BY MOUTH EVERY DAY  Dispense: 90 tablet; Refill: 1 - lisinopril (ZESTRIL) 20 MG tablet; Take 1 tablet (20 mg total) by mouth daily.  Dispense: 90 tablet; Refill: 1  2. Hyperlipidemia associated with type 2 diabetes mellitus (HCC) Low fat diet - Lipid panel - fenofibrate 160 MG tablet; Take 1 tablet (160 mg total) by mouth daily.  Dispense: 90 tablet; Refill: 1  3. Uncontrolled type 2 diabetes mellitus with hyperglycemia (HCC) Continue to watch carbs in diet - Bayer DCA Hb A1c Waived - dapagliflozin propanediol (FARXIGA) 5 MG TABS tablet; Take 1 tablet (5 mg total) by mouth daily.  Dispense: 90 tablet; Refill: 1 - Semaglutide,0.25 or 0.5MG /DOS, 2 MG/3ML SOPN; Inject 0.25 mg into the skin once a week. X4 weeks then increase to 0.5mg  sq weekly.  Start after surgery on 11/04/21. DX: E11.65  Dispense: 3 mL; Refill: 2  4. Diabetic polyneuropathy associated with type 2 diabetes mellitus (HCC) Check feet daily - gabapentin (NEURONTIN) 100 MG capsule; Take 1 capsule (100mg ) by mouth in the morning, 1 capsule (100mg ) in the afternoon, & 2 capsules (200mg ) in the evening  Dispense: 360 capsule; Refill: 1  5. Obstructive sleep apnea syndrome Need to get CPAP machine  6. Episode of recurrent major  depressive disorder, unspecified depression episode severity (HCC) Stress management - sertraline (ZOLOFT) 100 MG tablet; Take 2 tablets (200 mg total) by mouth daily.  Dispense: 180 tablet; Refill: 1  7. GAD (generalized anxiety disorder) - LORazepam (ATIVAN) 1 MG tablet; Take 1 tablet (1 mg total) by mouth 3 (three) times daily.  Dispense: 90 tablet; Refill: 2  8. Low serum vitamin D Continue vitamind supplement  9. Stress incontinence of urine  10. Osteopenia of lumbar spine  Weight bearing exercises  11. BMI 32.0-32.9,adult Discussed diet and exercise for person with BMI >25 Will recheck weight in 3-6 months    12. Urinary incontinence, unspecified type - mirabegron ER (MYRBETRIQ) 50 MG TB24 tablet; Take 1 tablet (50 mg total) by mouth daily.  Dispense: 30 tablet; Refill: 4   Labs pending Health Maintenance reviewed Diet and exercise encouraged  Follow up plan: 6 months   Mary-Margaret Daphine Deutscher, FNP

## 2022-07-21 DIAGNOSIS — M8589 Other specified disorders of bone density and structure, multiple sites: Secondary | ICD-10-CM | POA: Diagnosis not present

## 2022-07-21 DIAGNOSIS — Z780 Asymptomatic menopausal state: Secondary | ICD-10-CM | POA: Diagnosis not present

## 2022-07-21 LAB — CBC WITH DIFFERENTIAL/PLATELET
Basophils Absolute: 0.1 10*3/uL (ref 0.0–0.2)
Basos: 1 %
EOS (ABSOLUTE): 0.1 10*3/uL (ref 0.0–0.4)
Eos: 1 %
Hematocrit: 38.5 % (ref 34.0–46.6)
Hemoglobin: 12 g/dL (ref 11.1–15.9)
Immature Grans (Abs): 0 10*3/uL (ref 0.0–0.1)
Immature Granulocytes: 0 %
Lymphocytes Absolute: 2.2 10*3/uL (ref 0.7–3.1)
Lymphs: 20 %
MCH: 24.9 pg — ABNORMAL LOW (ref 26.6–33.0)
MCHC: 31.2 g/dL — ABNORMAL LOW (ref 31.5–35.7)
MCV: 80 fL (ref 79–97)
Monocytes Absolute: 0.6 10*3/uL (ref 0.1–0.9)
Monocytes: 6 %
Neutrophils Absolute: 7.6 10*3/uL — ABNORMAL HIGH (ref 1.4–7.0)
Neutrophils: 72 %
Platelets: 349 10*3/uL (ref 150–450)
RBC: 4.81 x10E6/uL (ref 3.77–5.28)
RDW: 14.3 % (ref 11.7–15.4)
WBC: 10.6 10*3/uL (ref 3.4–10.8)

## 2022-07-21 LAB — CMP14+EGFR
ALT: 12 IU/L (ref 0–32)
AST: 19 IU/L (ref 0–40)
Albumin/Globulin Ratio: 1.6 (ref 1.2–2.2)
Albumin: 3.9 g/dL (ref 3.8–4.8)
Alkaline Phosphatase: 66 IU/L (ref 44–121)
BUN/Creatinine Ratio: 17 (ref 12–28)
BUN: 15 mg/dL (ref 8–27)
Bilirubin Total: <0.2 mg/dL (ref 0.0–1.2)
CO2: 25 mmol/L (ref 20–29)
Calcium: 9 mg/dL (ref 8.7–10.3)
Chloride: 107 mmol/L — ABNORMAL HIGH (ref 96–106)
Creatinine, Ser: 0.88 mg/dL (ref 0.57–1.00)
Globulin, Total: 2.5 g/dL (ref 1.5–4.5)
Glucose: 85 mg/dL (ref 70–99)
Potassium: 4.4 mmol/L (ref 3.5–5.2)
Sodium: 144 mmol/L (ref 134–144)
Total Protein: 6.4 g/dL (ref 6.0–8.5)
eGFR: 69 mL/min/{1.73_m2} (ref >59)

## 2022-07-21 LAB — LIPID PANEL
Chol/HDL Ratio: 4.2 ratio (ref 0.0–4.4)
Cholesterol, Total: 146 mg/dL (ref 100–199)
HDL: 35 mg/dL — ABNORMAL LOW (ref >39)
LDL Chol Calc (NIH): 85 mg/dL (ref 0–99)
Triglycerides: 149 mg/dL (ref 0–149)
VLDL Cholesterol Cal: 26 mg/dL (ref 5–40)

## 2022-07-22 DIAGNOSIS — M1711 Unilateral primary osteoarthritis, right knee: Secondary | ICD-10-CM | POA: Diagnosis not present

## 2022-07-22 DIAGNOSIS — M170 Bilateral primary osteoarthritis of knee: Secondary | ICD-10-CM | POA: Diagnosis not present

## 2022-07-22 DIAGNOSIS — M1712 Unilateral primary osteoarthritis, left knee: Secondary | ICD-10-CM | POA: Diagnosis not present

## 2022-07-29 DIAGNOSIS — M25531 Pain in right wrist: Secondary | ICD-10-CM | POA: Diagnosis not present

## 2022-07-29 DIAGNOSIS — M170 Bilateral primary osteoarthritis of knee: Secondary | ICD-10-CM | POA: Diagnosis not present

## 2022-08-05 DIAGNOSIS — M1711 Unilateral primary osteoarthritis, right knee: Secondary | ICD-10-CM | POA: Diagnosis not present

## 2022-08-09 ENCOUNTER — Telehealth: Payer: Self-pay | Admitting: Nurse Practitioner

## 2022-08-09 NOTE — Telephone Encounter (Signed)
Pt says that Raynelle Fanning was going to inhaler to another one that was cheaper for pt. Pt says that she does not remember what julie was going to change inhaler to.  Pt needs a refill on insulin regular (NOVOLIN R) 100 units/mL injection. Use [facility]. Pt has been getting rx from pt assistance. Pt aware Raynelle Fanning is off today.

## 2022-08-11 ENCOUNTER — Encounter: Payer: Self-pay | Admitting: Pharmacist

## 2022-08-11 NOTE — Telephone Encounter (Signed)
I sent her a message to let her know there is no patient assistance for albuterol inhalers as they are generic.  I encouraged her to call her insurance to see which one is the cheapest and we can call in.  She is now getting all expensive meds free (diabetes) so this will hopefully offset inhaler.  Durward Mallard, can you novolin R to her meds for novo PAP? I'll route new encounter

## 2022-08-11 NOTE — Telephone Encounter (Signed)
Not sure exactly what you were going to do

## 2022-08-13 NOTE — Progress Notes (Deleted)
History of Present Illness: Alexandra Hunt is a 77 y.o. female who presents today for follow up visit at [facility]. - GU history: 1. OAB with urinary frequency, urgency, and urge incontinence. - Likely exacerbated by her OSA, T2DM with peripheral neuropathy, diuretic use (Lasix), and Farxiga use (causing glucosuria). - Failed Detrol.  2. Stress urinary incontinence.  At last visit with Dr. Ronne Binning on 05/24/2022: - Patient reported using 5+ pads per day.  - The plan was "We will trial mirabegron 25mg  daily. If this fails to improve her incontinence we will trial gemtesa."   Since last visit: ***  ***CPAP use?  Today: She reports ***  She reports urinary ***frequency and ***urgency. She reports voiding *** times per day and *** times at night. She {Actions; denies-reports:120008} urge incontinence with {DESC; SMALL/MODERATE/LARGE:19669} volume urine leakage *** times per ***day. Wears *** ***pads/***diapers per day.  She {Actions; denies-reports:120008} dysuria, gross hematuria, straining to void, or sensations of incomplete emptying.   Fall Screening: Do you usually have a device to assist in your mobility? {yes/no:20286} ***cane / ***walker / ***wheelchair   Medications: Current Outpatient Medications  Medication Sig Dispense Refill   albuterol (VENTOLIN HFA) 108 (90 Base) MCG/ACT inhaler Inhale 2 puffs into the lungs every 6 (six) hours as needed for wheezing or shortness of breath. 8 g 2   Alcohol Swabs (B-D SINGLE USE SWABS REGULAR) PADS TEST BS UP TO FOUR TIMES DAILY Dx E11.9 400 each 3   ALPHAGAN P 0.1 % SOLN Apply 1 drop to eye 2 (two) times daily. 15 mL 0   aspirin 81 MG tablet Take 81 mg by mouth daily.     Blood Glucose Calibration (TRUE METRIX LEVEL 1) Low SOLN Use with glucose machine Dx E11.9, 3 each 1   Blood Glucose Monitoring Suppl (TRUE METRIX METER) w/Device KIT TEST BS UP TO FOUR TIMES DAILY Dx E11.9 1 kit 0   Continuous Blood Gluc  Receiver (FREESTYLE LIBRE 2 READER) DEVI Use to test blood sugar 4-6 times daily as directed DX: E11.9 1 each 0   [facility] LUBRICANT EYE DROPS 0.6 % SOLN Apply 1 drop to eye 2 (two) times daily.     dapagliflozin propanediol (FARXIGA) 5 MG TABS tablet Take 1 tablet (5 mg total) by mouth daily. 90 tablet 1   diclofenac (VOLTAREN) 75 MG EC tablet TAKE 1 TABLET BY MOUTH TWICE A DAY AS NEEDED (Patient not taking: Reported on 07/20/2022) 20 tablet 0   dorzolamide (TRUSOPT) 2 % ophthalmic solution Place 2 drops into both eyes 2 (two) times daily.     fenofibrate 160 MG tablet Take 1 tablet (160 mg total) by mouth daily. 90 tablet 1   fluticasone (FLONASE) 50 MCG/ACT nasal spray SPRAY 2 SPRAYS INTO EACH NOSTRIL EVERY DAY 48 mL 1   furosemide (LASIX) 20 MG tablet TAKE 1 TABLET BY MOUTH EVERY DAY 90 tablet 1   gabapentin (NEURONTIN) 100 MG capsule Take 1 capsule (100mg ) by mouth in the morning, 1 capsule (100mg ) in the afternoon, & 2 capsules (200mg ) in the evening 360 capsule 1   glucose blood (TRUE METRIX BLOOD GLUCOSE TEST) test strip TEST BS UP TO FOUR TIMES DAILY Dx E11.9 400 each 3   ibuprofen (ADVIL) 800 MG tablet TAKE 1 TABLET BY MOUTH EVERY 8 HOURS AS NEEDED 30 tablet 2   insulin degludec (TRESIBA FLEXTOUCH) 200 UNIT/ML FlexTouch Pen Inject 90 Units into the skin 2 (two) times daily. Gets via [facility] patient  assistance     Insulin Pen Needle (B-D UF III MINI PEN NEEDLES) 31G X 5 MM MISC USE WITH SLIDING SCALE HUMALOG 4 TIMES DAILY Dx E11.9 400 each 2   insulin regular (NOVOLIN R) 100 units/mL injection INJECT 22UNITS 4 TIMES A DAY PER SLIDING SCALE 30 mL 2   Insulin Syringe-Needle U-100 (BD INSULIN SYRINGE U/F) 31G X 5/16" 1 ML MISC UAD w/ Lantus and Humalog insulin sliding scale Dx E11.9 400 each 3   Lancet Devices (TRUEDRAW LANCING DEVICE) MISC TEST BS UP TO FOUR TIMES DAILY Dx E11.9 1 each 1   lisinopril (ZESTRIL) 20 MG tablet Take 1 tablet (20 mg total) by mouth daily. 90 tablet 1   LORazepam  (ATIVAN) 1 MG tablet Take 1 tablet (1 mg total) by mouth 3 (three) times daily. 90 tablet 2   magic mouthwash SOLN Take 5 mLs by mouth 4 (four) times daily. 200 mL 0   meclizine (ANTIVERT) 25 MG tablet Take 1 tablet (25 mg total) by mouth 3 (three) times daily as needed for dizziness. 30 tablet 0   mirabegron ER (MYRBETRIQ) 50 MG TB24 tablet Take 1 tablet (50 mg total) by mouth daily. 30 tablet 4   mupirocin cream (BACTROBAN) 2 % APPLY TO AFFECTED AREA TWICE A DAY 15 g 0   nystatin (MYCOSTATIN) 100000 UNIT/ML suspension Take 5 mLs (500,000 Units total) by mouth 4 (four) times daily. 60 mL 0   ondansetron (ZOFRAN) 4 MG tablet      Semaglutide,0.25 or 0.5MG /DOS, 2 MG/3ML SOPN Inject 0.25 mg into the skin once a week. X4 weeks then increase to 0.5mg  sq weekly.  Start after surgery on 11/04/21. DX: E11.65 3 mL 2   sertraline (ZOLOFT) 100 MG tablet Take 2 tablets (200 mg total) by mouth daily. 180 tablet 1   TRUEplus Lancets 33G MISC TEST BS UP TO FOUR TIMES DAILY Dx E11.9 400 each 3   Vitamin D, Ergocalciferol, (DRISDOL) 1.25 MG (50000 UNIT) CAPS capsule TAKE 1 CAPSULE EVERY 7 DAYS 12 capsule 0   No current facility-administered medications for this visit.    Allergies: Allergies  Allergen Reactions   Penicillins     SOB,rash   Ivp Dye [Iodinated Contrast Media]     Burning at IV site   Statins     myalgias   Codeine     nauseated   Latex     rash   Sulfa Antibiotics     nauseated    Past Medical History:  Diagnosis Date   Benign breast lumps    Cancer (HCC) 2007   uterine   Cervical radiculopathy    Cervical radiculopathy    Change in bowel habits    soft stools   Depression    Diabetes mellitus without complication (HCC)    type 2 diabetic   History of anal fissures    Hx of adenomatous colonic polyps 03/20/2018   Hx of gallstones    Hyperlipidemia    Hypertension    Schizoid personality disorder (HCC)    pt not taking her meds  (pt states this is an inaccurate diagnosis  and wants it removed) 03-15-2018   Skin cancer    on arms/right hand   Sleep apnea    Past Surgical History:  Procedure Laterality Date   ABDOMINAL HYSTERECTOMY     due to uterine cancer   BREAST SURGERY     left breast biopsy/benign   TUBAL LIGATION     Family History  Problem  Relation Age of Onset   Diabetes Mother    Blindness Mother        related to diabetes   Stroke Mother    Heart disease Father        MI   Heart attack Father    Heart defect Father    Breast cancer Sister    Diabetes Brother        diet controlled   Colon cancer Neg Hx    Esophageal cancer Neg Hx    Rectal cancer Neg Hx    Stomach cancer Neg Hx    Social History   Socioeconomic History   Marital status: Widowed    Spouse name: Not on file   Number of children: 5   Years of education: 61   Highest education level: Associate degree: occupational, Scientist, product/process development, or vocational program  Occupational History   Occupation: retired  Tobacco Use   Smoking status: Never    Passive exposure: Never   Smokeless tobacco: Never  Vaping Use   Vaping Use: Never used  Substance and Sexual Activity   Alcohol use: No   Drug use: No   Sexual activity: Not Currently    Birth control/protection: Post-menopausal  Other Topics Concern   Not on file  Social History Narrative   Son, Daughter, and granddaughter live with her   Her daughter is going through drug addiction treatment   Lives in one level home with basement - she doesn't use stairs at all.   She has been very withdrawn and depressed since her husband passed.   Social Determinants of Health   Financial Resource Strain: Low Risk  (03/24/2022)   Overall Financial Resource Strain (CARDIA)    Difficulty of Paying Living Expenses: Not hard at all  Food Insecurity: No Food Insecurity (03/24/2022)   Hunger Vital Sign    Worried About Running Out of Food in the Last Year: Never true    Ran Out of Food in the Last Year: Never true  Transportation Needs:  No Transportation Needs (03/24/2022)   PRAPARE - Administrator, Civil Service (Medical): No    Lack of Transportation (Non-Medical): No  Physical Activity: Inactive (03/24/2022)   Exercise Vital Sign    Days of Exercise per Week: 0 days    Minutes of Exercise per Session: 0 min  Stress: No Stress Concern Present (03/24/2022)   [employer] of Occupational Health - Occupational Stress Questionnaire    Feeling of Stress : Only a little  Social Connections: Moderately Isolated (03/24/2022)   Social Connection and Isolation Panel [NHANES]    Frequency of Communication with Friends and Family: More than three times a week    Frequency of Social Gatherings with Friends and Family: More than three times a week    Attends Religious Services: 1 to 4 times per year    Active Member of [employer] or Organizations: No    Attends Banker Meetings: Never    Marital Status: Widowed  Intimate Partner Violence: Not At Risk (03/24/2022)   Humiliation, Afraid, Rape, and Kick questionnaire    Fear of Current or Ex-Partner: No    Emotionally Abused: No    Physically Abused: No    Sexually Abused: No    Review of Systems Constitutional: Patient ***denies any unintentional weight loss or change in strength lntegumentary: Patient ***denies any rashes or pruritus Eyes: Patient denies ***dry eyes ENT: Patient ***denies dry mouth Cardiovascular: Patient ***denies chest pain or syncope Respiratory: Patient ***denies  shortness of breath Gastrointestinal: Patient ***denies nausea, vomiting, constipation, or diarrhea Musculoskeletal: Patient ***denies muscle cramps or weakness Neurologic: Patient ***denies convulsions or seizures Psychiatric: Patient ***denies memory problems Allergic/Immunologic: Patient ***denies recent allergic reaction(s) Hematologic/Lymphatic: Patient denies bleeding tendencies Endocrine: Patient ***denies heat/cold intolerance  GU: As per HPI.  OBJECTIVE There  were no vitals filed for this visit. There is no height or weight on file to calculate BMI.  Physical Examination  Constitutional: ***No obvious distress; patient is ***non-toxic appearing  Cardiovascular: ***No visible lower extremity edema.  Respiratory: The patient does ***not have audible wheezing/stridor; respirations do ***not appear labored  Gastrointestinal: Abdomen ***non-distended Musculoskeletal: ***Normal ROM of UEs  Skin: ***No obvious rashes/open sores  Neurologic: CN 2-12 grossly ***intact Psychiatric: Answered questions ***appropriately with ***normal affect  Hematologic/Lymphatic/Immunologic: ***No obvious bruises or sites of spontaneous bleeding  UA: {Desc; negative/positive:13464} *** WBC/hpf, *** RBC/hpf, bacteria (***) *** nitrites, *** leukocytes, *** blood PVR: *** ml  ASSESSMENT No diagnosis found. ***  Will plan for follow up in ***months / ***1 year or sooner if needed. Pt verbalized understanding and agreement. All questions were answered.  PLAN Advised the following: 1. *** 2. ***No follow-ups on file.  No orders of the defined types were placed in this encounter.   It has been explained that the patient is to follow regularly with their PCP in addition to all other providers involved in their care and to follow instructions provided by these respective offices. Patient advised to contact urology clinic if any urologic-pertaining questions, concerns, new symptoms or problems arise in the interim period.  There are no Patient Instructions on file for this visit.  Electronically signed by:  Donnita Falls, FNP   08/13/22    3:19 PM

## 2022-08-17 ENCOUNTER — Ambulatory Visit: Payer: Medicare HMO | Admitting: Urology

## 2022-08-24 ENCOUNTER — Encounter: Payer: Self-pay | Admitting: Pharmacist

## 2022-08-24 ENCOUNTER — Telehealth: Payer: Self-pay | Admitting: Nurse Practitioner

## 2022-08-24 NOTE — Telephone Encounter (Signed)
Left message making pt aware that our office received a call from [facility] from [facility] stating that pts Ozempic, Tresiba, and Needles should be delivered to our office today and that our office would call and notify her once order is here.

## 2022-08-25 ENCOUNTER — Other Ambulatory Visit: Payer: Self-pay | Admitting: Nurse Practitioner

## 2022-08-25 DIAGNOSIS — G5603 Carpal tunnel syndrome, bilateral upper limbs: Secondary | ICD-10-CM

## 2022-08-27 DIAGNOSIS — M1711 Unilateral primary osteoarthritis, right knee: Secondary | ICD-10-CM | POA: Diagnosis not present

## 2022-08-30 ENCOUNTER — Other Ambulatory Visit: Payer: Self-pay | Admitting: Nurse Practitioner

## 2022-09-03 NOTE — Telephone Encounter (Signed)
Received notification from [employer] regarding approval for NOVOLIN R. Patient assistance approved from 09/02/22 to 03/01/23.  Medication will ship to office along with other medications from this company.   Phone: [phone]

## 2022-09-12 DIAGNOSIS — E1165 Type 2 diabetes mellitus with hyperglycemia: Secondary | ICD-10-CM | POA: Diagnosis not present

## 2022-09-14 ENCOUNTER — Encounter: Payer: Self-pay | Admitting: Pharmacist

## 2022-09-23 ENCOUNTER — Other Ambulatory Visit: Payer: Self-pay | Admitting: Nurse Practitioner

## 2022-09-30 ENCOUNTER — Other Ambulatory Visit: Payer: Self-pay | Admitting: Nurse Practitioner

## 2022-09-30 DIAGNOSIS — L989 Disorder of the skin and subcutaneous tissue, unspecified: Secondary | ICD-10-CM

## 2022-10-01 ENCOUNTER — Telehealth: Payer: Self-pay

## 2022-10-01 NOTE — Telephone Encounter (Signed)
Medication was refilled and picked up on 7/31

## 2022-10-01 NOTE — Telephone Encounter (Signed)
Patient left a voice message.  Needing refill (s)  on mirabegron ER (MYRBETRIQ) 50 MG TB24 tablet

## 2022-10-05 ENCOUNTER — Ambulatory Visit: Payer: Medicare HMO | Admitting: Nurse Practitioner

## 2022-10-10 ENCOUNTER — Other Ambulatory Visit: Payer: Self-pay | Admitting: Nurse Practitioner

## 2022-10-12 DIAGNOSIS — E113293 Type 2 diabetes mellitus with mild nonproliferative diabetic retinopathy without macular edema, bilateral: Secondary | ICD-10-CM | POA: Diagnosis not present

## 2022-10-12 DIAGNOSIS — H40023 Open angle with borderline findings, high risk, bilateral: Secondary | ICD-10-CM | POA: Diagnosis not present

## 2022-10-12 DIAGNOSIS — H524 Presbyopia: Secondary | ICD-10-CM | POA: Diagnosis not present

## 2022-10-12 DIAGNOSIS — H35372 Puckering of macula, left eye: Secondary | ICD-10-CM | POA: Diagnosis not present

## 2022-10-12 DIAGNOSIS — H353132 Nonexudative age-related macular degeneration, bilateral, intermediate dry stage: Secondary | ICD-10-CM | POA: Diagnosis not present

## 2022-11-11 ENCOUNTER — Ambulatory Visit (INDEPENDENT_AMBULATORY_CARE_PROVIDER_SITE_OTHER): Payer: Medicare HMO | Admitting: Nurse Practitioner

## 2022-11-11 ENCOUNTER — Encounter: Payer: Self-pay | Admitting: Nurse Practitioner

## 2022-11-11 VITALS — BP 118/61 | HR 80 | Temp 97.3°F | Resp 20 | Ht 64.0 in | Wt 200.0 lb

## 2022-11-11 DIAGNOSIS — Z6832 Body mass index (BMI) 32.0-32.9, adult: Secondary | ICD-10-CM

## 2022-11-11 DIAGNOSIS — I10 Essential (primary) hypertension: Secondary | ICD-10-CM | POA: Diagnosis not present

## 2022-11-11 DIAGNOSIS — E1142 Type 2 diabetes mellitus with diabetic polyneuropathy: Secondary | ICD-10-CM | POA: Diagnosis not present

## 2022-11-11 DIAGNOSIS — E1165 Type 2 diabetes mellitus with hyperglycemia: Secondary | ICD-10-CM | POA: Diagnosis not present

## 2022-11-11 DIAGNOSIS — F339 Major depressive disorder, recurrent, unspecified: Secondary | ICD-10-CM | POA: Diagnosis not present

## 2022-11-11 DIAGNOSIS — F411 Generalized anxiety disorder: Secondary | ICD-10-CM

## 2022-11-11 DIAGNOSIS — E785 Hyperlipidemia, unspecified: Secondary | ICD-10-CM

## 2022-11-11 DIAGNOSIS — R7989 Other specified abnormal findings of blood chemistry: Secondary | ICD-10-CM | POA: Diagnosis not present

## 2022-11-11 DIAGNOSIS — L409 Psoriasis, unspecified: Secondary | ICD-10-CM

## 2022-11-11 DIAGNOSIS — Z23 Encounter for immunization: Secondary | ICD-10-CM

## 2022-11-11 DIAGNOSIS — E1169 Type 2 diabetes mellitus with other specified complication: Secondary | ICD-10-CM | POA: Diagnosis not present

## 2022-11-11 DIAGNOSIS — E782 Mixed hyperlipidemia: Secondary | ICD-10-CM

## 2022-11-11 DIAGNOSIS — R32 Unspecified urinary incontinence: Secondary | ICD-10-CM

## 2022-11-11 LAB — BAYER DCA HB A1C WAIVED: HB A1C (BAYER DCA - WAIVED): 6.9 % — ABNORMAL HIGH (ref 4.8–5.6)

## 2022-11-11 MED ORDER — FUROSEMIDE 20 MG PO TABS
ORAL_TABLET | ORAL | 1 refills | Status: DC
Start: 2022-11-11 — End: 2023-06-20

## 2022-11-11 MED ORDER — MIRABEGRON ER 50 MG PO TB24
50.0000 mg | ORAL_TABLET | Freq: Every day | ORAL | 1 refills | Status: DC
Start: 2022-11-11 — End: 2023-06-20

## 2022-11-11 MED ORDER — LORAZEPAM 1 MG PO TABS
1.0000 mg | ORAL_TABLET | Freq: Three times a day (TID) | ORAL | 2 refills | Status: DC
Start: 2022-11-11 — End: 2023-01-31

## 2022-11-11 MED ORDER — LISINOPRIL 20 MG PO TABS
20.0000 mg | ORAL_TABLET | Freq: Every day | ORAL | 1 refills | Status: DC
Start: 2022-11-11 — End: 2023-06-20

## 2022-11-11 MED ORDER — GABAPENTIN 100 MG PO CAPS
ORAL_CAPSULE | ORAL | 1 refills | Status: DC
Start: 2022-11-11 — End: 2023-06-20

## 2022-11-11 MED ORDER — FENOFIBRATE 160 MG PO TABS: 160.0000 mg | ORAL_TABLET | Freq: Every day | ORAL | 1 refills | Status: DC

## 2022-11-11 MED ORDER — SEMAGLUTIDE(0.25 OR 0.5MG/DOS) 2 MG/3ML ~~LOC~~ SOPN
0.2500 mg | PEN_INJECTOR | SUBCUTANEOUS | 2 refills | Status: DC
Start: 2022-11-11 — End: 2023-01-06

## 2022-11-11 MED ORDER — DAPAGLIFLOZIN PROPANEDIOL 5 MG PO TABS: 5.0000 mg | ORAL_TABLET | Freq: Every day | ORAL | 1 refills | Status: DC

## 2022-11-11 MED ORDER — SERTRALINE HCL 100 MG PO TABS: 200.0000 mg | ORAL_TABLET | Freq: Every day | ORAL | 1 refills | Status: DC

## 2022-11-11 NOTE — Progress Notes (Signed)
Subjective:    Patient ID: Alexandra Hunt, female    DOB: [birth year], 77 y.o.   MRN: [id number]   Chief Complaint: medical management of chronic issues     HPI:  Alexandra Hunt is a 77 y.o. who identifies as a female who was assigned female at birth.   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 Essential hypertension, malignant No c/o chest pain, sob or headache. Does not check blood pressure at home. BP Readings from Last 3 Encounters:  07/20/22 127/73  05/24/22 (!) 102/59  04/09/22 114/61     2. Diabetic polyneuropathy associated with type 2 diabetes mellitus (HCC) Blood sugars are all over the place. She does not watch her diet. Lab Results  Component Value Date   HGBA1C 6.6 (H) 07/20/2022     3. Hyperlipidemia associated with type 2 diabetes mellitus (HCC) Doe nsot wtahc diet. Has poor appeteite. Does not do any exercise. Lab Results  Component Value Date   CHOL 146 07/20/2022   HDL 35 (L) 07/20/2022   LDLCALC 85 07/20/2022   TRIG 149 07/20/2022   CHOLHDL 4.2 07/20/2022     4. Episode of recurrent major depressive disorder, unspecified depression episode severity (HCC) Ison zoloft and is maintaining. She has been depressed since her husband passed away.    November 13, 2022    3:28 PM 04/09/2022    3:58 PM 03/24/2022    3:55 PM  Depression screen PHQ 2/9  Decreased Interest 0 3 0  Down, Depressed, Hopeless 0 2 0  PHQ - 2 Score 0 5 0  Altered sleeping 0 2   Tired, decreased energy 0 2   Change in appetite 0 2   Feeling bad or failure about yourself  0 2   Trouble concentrating 0 0   Moving slowly or fidgety/restless 0 0   Suicidal thoughts 0 0   PHQ-9 Score 0 13   Difficult doing work/chores Not difficult at all Somewhat difficult       5. GAD (generalized anxiety disorder) Is on ativan 3x a day. Has been on this  for awhile. .    2022-11-13    3:28 PM 04/09/2022    3:58 PM 12/28/2021    3:49 PM 05/08/2021   10:15 AM  GAD 7 : Generalized Anxiety Score  Nervous, Anxious, on Edge 0 2 2 2   Control/stop worrying 0 2 2 2   Worry too much - different things 0 2 2 2   Trouble relaxing 0 1 1 1   Restless 0 1 1 1   Easily annoyed or irritable 0 0 0 0  Afraid - awful might happen 0 1 1 1   Total GAD 7 Score 0 9 9 9   Anxiety Difficulty Not difficult at all Somewhat difficult Somewhat difficult Somewhat difficult      6. Low serum vitamin D Does not always take vitamin d supplement  7. BMI 32.0-32.9,adult Weight is up 5lbs  Wt Readings from Last 3 Encounters:  Nov 13, 2022 200 lb (90.7 kg)  07/20/22 195 lb (88.5 kg)  04/09/22 198 lb (89.8 kg)   BMI Readings from Last 3 Encounters:  2022-11-13 34.33 kg/m  07/20/22 33.47 kg/m  04/09/22 33.99 kg/m      New complaints: None today  Allergies  Allergen Reactions   Penicillins     SOB,rash   Ivp Dye [Iodinated Contrast Media]  Burning at IV site   Statins     myalgias   Codeine     nauseated   Latex     rash   Sulfa Antibiotics     nauseated   Outpatient Encounter Medications as of 11/11/2022  Medication Sig   albuterol (VENTOLIN HFA) 108 (90 Base) MCG/ACT inhaler Inhale 2 puffs into the lungs every 6 (six) hours as needed for wheezing or shortness of breath.   Alcohol Swabs (B-D SINGLE USE SWABS REGULAR) PADS TEST BS UP TO FOUR TIMES DAILY Dx E11.9   ALPHAGAN P 0.1 % SOLN APPLY 1 DROP TO EYE 2 TIMES DAILY.   aspirin 81 MG tablet Take 81 mg by mouth daily.   Blood Glucose Calibration (TRUE METRIX LEVEL 1) Low SOLN Use with glucose machine Dx E11.9,   Blood Glucose Monitoring Suppl (TRUE METRIX METER) w/Device KIT TEST BS UP TO FOUR TIMES DAILY Dx E11.9   Continuous Blood Gluc Receiver (FREESTYLE LIBRE 2 READER) DEVI Use to test blood sugar 4-6 times daily as directed DX: E11.9   [employer] LUBRICANT EYE DROPS 0.6 % SOLN Apply 1 drop to eye 2  (two) times daily.   dapagliflozin propanediol (FARXIGA) 5 MG TABS tablet Take 1 tablet (5 mg total) by mouth daily.   diclofenac (VOLTAREN) 75 MG EC tablet TAKE 1 TABLET BY MOUTH TWICE A DAY AS NEEDED (Patient not taking: Reported on 07/20/2022)   dorzolamide (TRUSOPT) 2 % ophthalmic solution Place 2 drops into both eyes 2 (two) times daily.   fenofibrate 160 MG tablet Take 1 tablet (160 mg total) by mouth daily.   fluticasone (FLONASE) 50 MCG/ACT nasal spray SPRAY 2 SPRAYS INTO EACH NOSTRIL EVERY DAY   furosemide (LASIX) 20 MG tablet TAKE 1 TABLET BY MOUTH EVERY DAY   gabapentin (NEURONTIN) 100 MG capsule Take 1 capsule (100mg ) by mouth in the morning, 1 capsule (100mg ) in the afternoon, & 2 capsules (200mg ) in the evening   glucose blood (TRUE METRIX BLOOD GLUCOSE TEST) test strip TEST BS UP TO FOUR TIMES DAILY Dx E11.9   ibuprofen (ADVIL) 800 MG tablet TAKE 1 TABLET BY MOUTH EVERY 8 HOURS AS NEEDED   insulin degludec (TRESIBA FLEXTOUCH) 200 UNIT/ML FlexTouch Pen Inject 90 Units into the skin 2 (two) times daily. Gets via [employer] patient assistance   Insulin Pen Needle (B-D UF III MINI PEN NEEDLES) 31G X 5 MM MISC USE WITH SLIDING SCALE HUMALOG 4 TIMES DAILY Dx E11.9   insulin regular (NOVOLIN R) 100 units/mL injection INJECT 22UNITS 4 TIMES A DAY PER SLIDING SCALE   Insulin Syringe-Needle U-100 (BD INSULIN SYRINGE U/F) 31G X 5/16" 1 ML MISC UAD w/ Lantus and Humalog insulin sliding scale Dx E11.9   Lancet Devices (TRUEDRAW LANCING DEVICE) MISC TEST BS UP TO FOUR TIMES DAILY Dx E11.9   lisinopril (ZESTRIL) 20 MG tablet Take 1 tablet (20 mg total) by mouth daily.   LORazepam (ATIVAN) 1 MG tablet Take 1 tablet (1 mg total) by mouth 3 (three) times daily.   magic mouthwash SOLN Take 5 mLs by mouth 4 (four) times daily.   meclizine (ANTIVERT) 25 MG tablet Take 1 tablet (25 mg total) by mouth 3 (three) times daily as needed for dizziness.   mirabegron ER (MYRBETRIQ) 50 MG TB24 tablet Take 1  tablet (50 mg total) by mouth daily.   mupirocin cream (BACTROBAN) 2 % APPLY TO AFFECTED AREA TWICE A DAY   nystatin (MYCOSTATIN) 100000 UNIT/ML suspension Take 5 mLs (500,000  Units total) by mouth 4 (four) times daily.   ondansetron (ZOFRAN) 4 MG tablet    Semaglutide,0.25 or 0.5MG /DOS, 2 MG/3ML SOPN Inject 0.25 mg into the skin once a week. X4 weeks then increase to 0.5mg  sq weekly.  Start after surgery on 11/04/21. DX: E11.65   sertraline (ZOLOFT) 100 MG tablet Take 2 tablets (200 mg total) by mouth daily.   TRUEplus Lancets 33G MISC TEST BS UP TO FOUR TIMES DAILY Dx E11.9   Vitamin D, Ergocalciferol, (DRISDOL) 1.25 MG (50000 UNIT) CAPS capsule TAKE 1 CAPSULE EVERY 7 DAYS   No facility-administered encounter medications on file as of 11/11/2022.    Past Surgical History:  Procedure Laterality Date   ABDOMINAL HYSTERECTOMY     due to uterine cancer   BREAST SURGERY     left breast biopsy/benign   TUBAL LIGATION      Family History  Problem Relation Age of Onset   Diabetes Mother    Blindness Mother        related to diabetes   Stroke Mother    Heart disease Father        MI   Heart attack Father    Heart defect Father    Breast cancer Sister    Diabetes Brother        diet controlled   Colon cancer Neg Hx    Esophageal cancer Neg Hx    Rectal cancer Neg Hx    Stomach cancer Neg Hx       Controlled substance contract: 04/14/22    Review of Systems  Constitutional:  Negative for diaphoresis.  Eyes:  Negative for pain.  Respiratory:  Negative for shortness of breath.   Cardiovascular:  Negative for chest pain, palpitations and leg swelling.  Gastrointestinal:  Negative for abdominal pain.  Endocrine: Negative for polydipsia.  Skin:  Negative for rash.  Neurological:  Negative for dizziness, weakness and headaches.  Hematological:  Does not bruise/bleed easily.  All other systems reviewed and are negative.      Objective:   Physical Exam Vitals and nursing note  reviewed.  Constitutional:      General: She is not in acute distress.    Appearance: Normal appearance. She is well-developed.  HENT:     Head: Normocephalic.     Right Ear: Tympanic membrane normal.     Left Ear: Tympanic membrane normal.     Nose: Nose normal.     Mouth/Throat:     Mouth: Mucous membranes are moist.  Eyes:     Pupils: Pupils are equal, round, and reactive to light.  Neck:     Vascular: No carotid bruit or JVD.  Cardiovascular:     Rate and Rhythm: Normal rate and regular rhythm.     Heart sounds: Normal heart sounds.  Pulmonary:     Effort: Pulmonary effort is normal. No respiratory distress.     Breath sounds: Normal breath sounds. No wheezing or rales.  Chest:     Chest wall: No tenderness.  Abdominal:     General: Bowel sounds are normal. There is no distension or abdominal bruit.     Palpations: Abdomen is soft. There is no hepatomegaly, splenomegaly, mass or pulsatile mass.     Tenderness: There is no abdominal tenderness.  Musculoskeletal:        General: Normal range of motion.     Cervical back: Normal range of motion and neck supple.  Lymphadenopathy:     Cervical: No cervical adenopathy.  Skin:    General: Skin is warm and dry.  Neurological:     Mental Status: She is alert and oriented to person, place, and time.     Deep Tendon Reflexes: Reflexes are normal and symmetric.  Psychiatric:        Behavior: Behavior normal.        Thought Content: Thought content normal.        Judgment: Judgment normal.     BP 118/61   Pulse 80   Temp (!) 97.3 F (36.3 C) (Temporal)   Resp 20   Ht 5\' 4"  (1.626 m)   Wt 200 lb (90.7 kg)   SpO2 98%   BMI 34.33 kg/m   Hgba1c 6.9%     Assessment & Plan:   Alexandra Hunt comes in today with chief complaint of Medical Management of Chronic Issues (Right leg pain/)   Diagnosis and orders addressed:  1. Essential hypertension, malignant Low sodium diet - CBC with Differential/Platelet -  CMP14+EGFR - furosemide (LASIX) 20 MG tablet; TAKE 1 TABLET BY MOUTH EVERY DAY  Dispense: 90 tablet; Refill: 1 - lisinopril (ZESTRIL) 20 MG tablet; Take 1 tablet (20 mg total) by mouth daily.  Dispense: 90 tablet; Refill: 1  2. Diabetic polyneuropathy associated with type 2 diabetes mellitus (HCC) Do not go barefooted Check feet daily - gabapentin (NEURONTIN) 100 MG capsule; Take 1 capsule (100mg ) by mouth in the morning, 1 capsule (100mg ) in the afternoon, & 2 capsules (200mg ) in the evening  Dispense: 360 capsule; Refill: 1  3. Hyperlipidemia associated with type 2 diabetes mellitus (HCC) Low fat diet - Lipid panel - fenofibrate 160 MG tablet; Take 1 tablet (160 mg total) by mouth daily.  Dispense: 90 tablet; Refill: 1  4. Episode of recurrent major depressive disorder, unspecified depression episode severity (HCC) Stress management - sertraline (ZOLOFT) 100 MG tablet; Take 2 tablets (200 mg total) by mouth daily.  Dispense: 180 tablet; Refill: 1  5. GAD (generalized anxiety disorder) - LORazepam (ATIVAN) 1 MG tablet; Take 1 tablet (1 mg total) by mouth 3 (three) times daily.  Dispense: 90 tablet; Refill: 2  6. Low serum vitamin D Continue vitamin d supplement  7. BMI 32.0-32.9,adult Discussed diet and exercise for person with BMI >25 Will recheck weight in 3-6 months   8. Uncontrolled type 2 diabetes mellitus with hyperglycemia (HCC) Continue to watch carbs in diet - Bayer DCA Hb A1c Waived - dapagliflozin propanediol (FARXIGA) 5 MG TABS tablet; Take 1 tablet (5 mg total) by mouth daily.  Dispense: 90 tablet; Refill: 1 - Semaglutide,0.25 or 0.5MG /DOS, 2 MG/3ML SOPN; Inject 0.25 mg into the skin once a week. X4 weeks then increase to 0.5mg  sq weekly.  Start after surgery on 11/04/21. DX: E11.65  Dispense: 3 mL; Refill: 2  9. Mixed hyperlipidemia Low fat diet  10. Urinary incontinence, unspecified type - mirabegron ER (MYRBETRIQ) 50 MG TB24 tablet; Take 1 tablet (50 mg total) by  mouth daily.  Dispense: 90 tablet; Refill: 1   Labs pending Health Maintenance reviewed Diet and exercise encouraged  Follow up plan: 3 months   Mary-Margaret Daphine Deutscher, FNP

## 2022-11-11 NOTE — Patient Instructions (Signed)

## 2022-11-12 LAB — CBC WITH DIFFERENTIAL/PLATELET
Basophils Absolute: 0.1 10*3/uL (ref 0.0–0.2)
Basos: 1 %
EOS (ABSOLUTE): 0.1 10*3/uL (ref 0.0–0.4)
Eos: 1 %
Hematocrit: 40.2 % (ref 34.0–46.6)
Hemoglobin: 12.6 g/dL (ref 11.1–15.9)
Immature Grans (Abs): 0 10*3/uL (ref 0.0–0.1)
Immature Granulocytes: 0 %
Lymphocytes Absolute: 2.9 10*3/uL (ref 0.7–3.1)
Lymphs: 27 %
MCH: 26.6 pg (ref 26.6–33.0)
MCHC: 31.3 g/dL — ABNORMAL LOW (ref 31.5–35.7)
MCV: 85 fL (ref 79–97)
Monocytes Absolute: 0.7 10*3/uL (ref 0.1–0.9)
Monocytes: 6 %
Neutrophils Absolute: 7 10*3/uL (ref 1.4–7.0)
Neutrophils: 65 %
Platelets: 351 10*3/uL (ref 150–450)
RBC: 4.74 x10E6/uL (ref 3.77–5.28)
RDW: 13.5 % (ref 11.7–15.4)
WBC: 10.7 10*3/uL (ref 3.4–10.8)

## 2022-11-12 LAB — CMP14+EGFR
ALT: 16 IU/L (ref 0–32)
AST: 21 IU/L (ref 0–40)
Albumin: 4 g/dL (ref 3.8–4.8)
Alkaline Phosphatase: 60 IU/L (ref 44–121)
BUN/Creatinine Ratio: 12 (ref 12–28)
BUN: 12 mg/dL (ref 8–27)
Bilirubin Total: 0.2 mg/dL (ref 0.0–1.2)
CO2: 25 mmol/L (ref 20–29)
Calcium: 9.2 mg/dL (ref 8.7–10.3)
Chloride: 105 mmol/L (ref 96–106)
Creatinine, Ser: 1.03 mg/dL — ABNORMAL HIGH (ref 0.57–1.00)
Globulin, Total: 2.8 g/dL (ref 1.5–4.5)
Glucose: 82 mg/dL (ref 70–99)
Potassium: 4.4 mmol/L (ref 3.5–5.2)
Sodium: 143 mmol/L (ref 134–144)
Total Protein: 6.8 g/dL (ref 6.0–8.5)
eGFR: 57 mL/min/{1.73_m2} — ABNORMAL LOW (ref >59)

## 2022-11-12 LAB — LIPID PANEL
Chol/HDL Ratio: 3.8 ratio (ref 0.0–4.4)
Cholesterol, Total: 128 mg/dL (ref 100–199)
HDL: 34 mg/dL — ABNORMAL LOW (ref >39)
LDL Chol Calc (NIH): 67 mg/dL (ref 0–99)
Triglycerides: 158 mg/dL — ABNORMAL HIGH (ref 0–149)
VLDL Cholesterol Cal: 27 mg/dL (ref 5–40)

## 2022-11-15 ENCOUNTER — Other Ambulatory Visit: Payer: Self-pay | Admitting: Nurse Practitioner

## 2022-11-15 DIAGNOSIS — L409 Psoriasis, unspecified: Secondary | ICD-10-CM

## 2022-11-24 IMAGING — MR MR CERVICAL SPINE W/O CM
4 of 5 series · 27 of 48 positions shown · non-contrast
Comparison: CT cervical spine dated September 28, 2014.

CLINICAL DATA: Right-sided neck and shoulder pain. Bilateral hand
numbness. No prior surgery.

EXAM:
MRI CERVICAL SPINE WITHOUT CONTRAST
TECHNIQUE: Multiplanar, multisequence MR imaging of the cervical spine was
performed. No intravenous contrast was administered.

[Series 5: T2 · sagittal · 3.0mm · 0.55mm/px · 6 of 15 slices shown (1 of 2)]
[im 1/15]
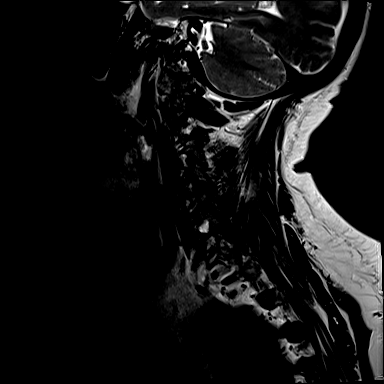
[im 3/15]
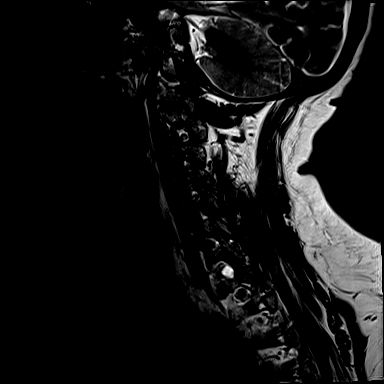
[im 6/15]
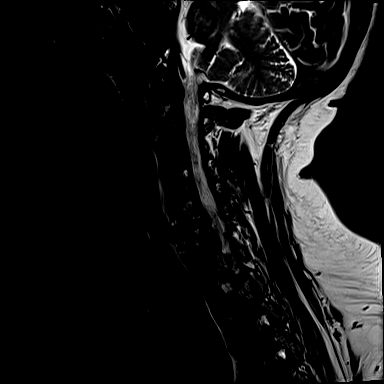
[im 9/15]
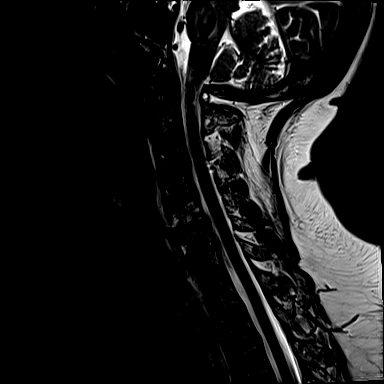
[im 12/15]
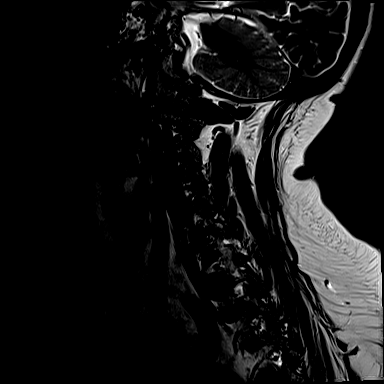
[im 15/15]
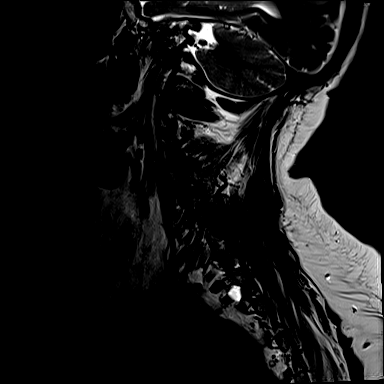

[Series 6: T1 · sagittal · 3.0mm · 0.66mm/px · 7 of 15 slices shown]
[im 1/15]
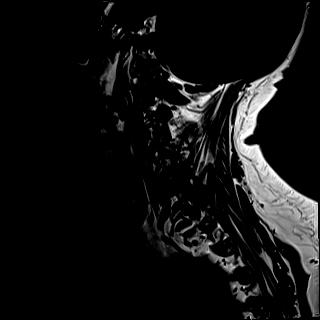
[im 3/15]
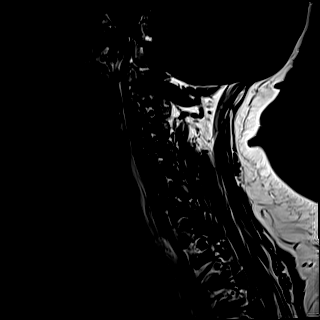
[im 5/15]
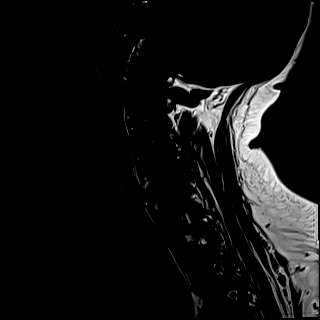
[im 8/15]
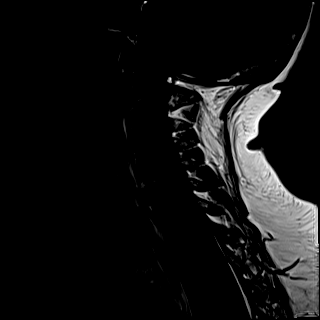
[im 10/15]
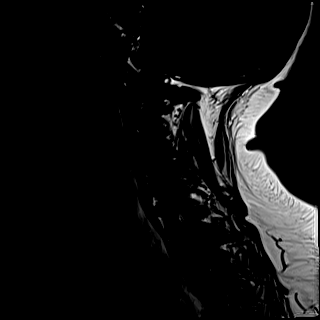
[im 12/15]
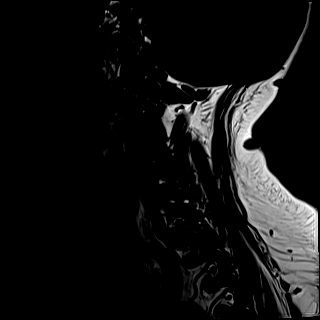
[im 15/15]
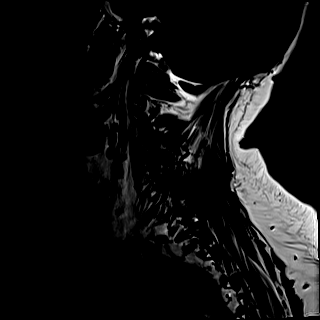

[Series 7: STIR · sagittal · 3.0mm · 0.33mm/px · 6 of 15 slices shown]
[im 1/15]
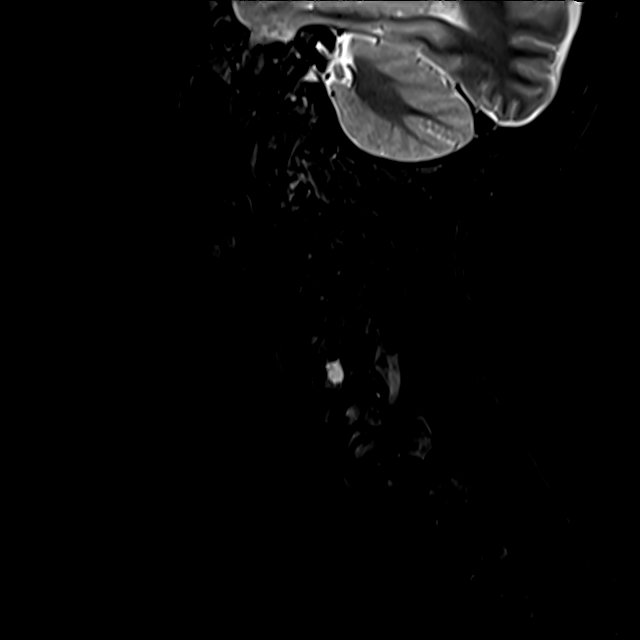
[im 3/15]
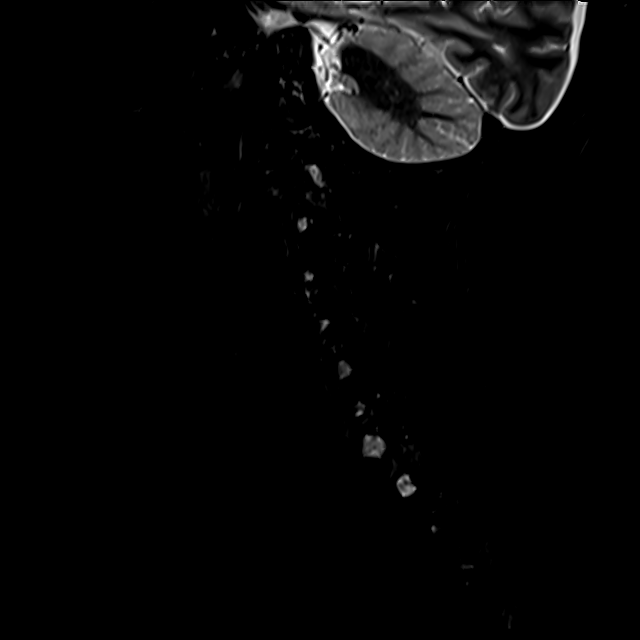
[im 5/15]
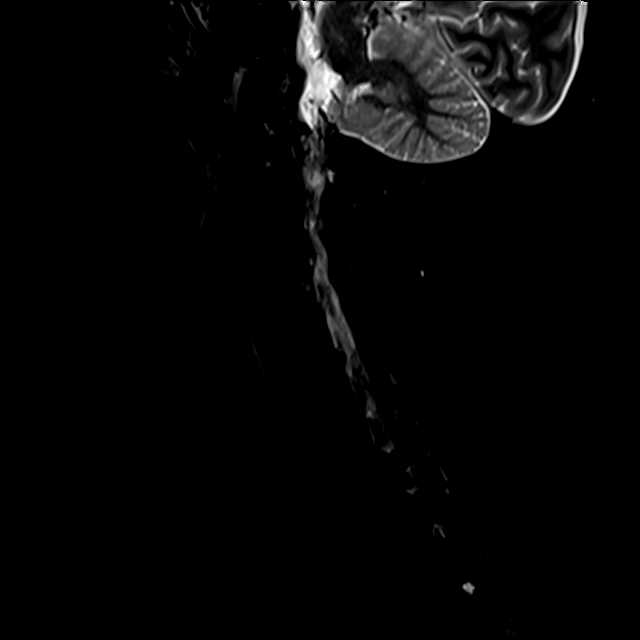
[im 8/15]
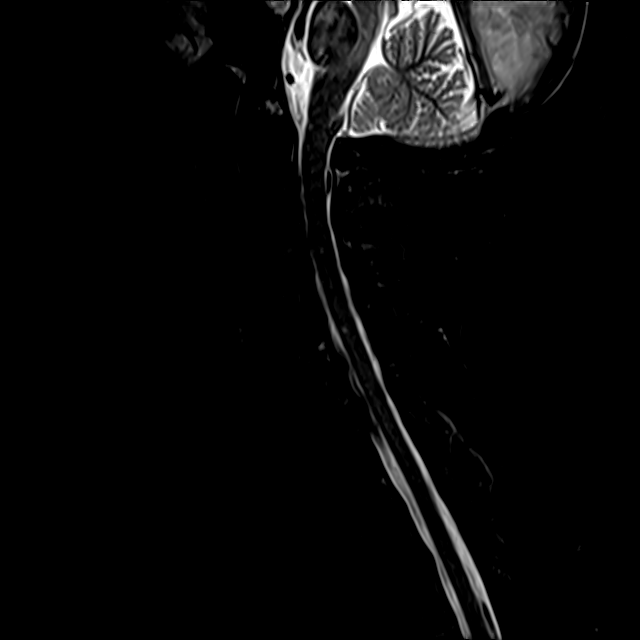
[im 10/15]
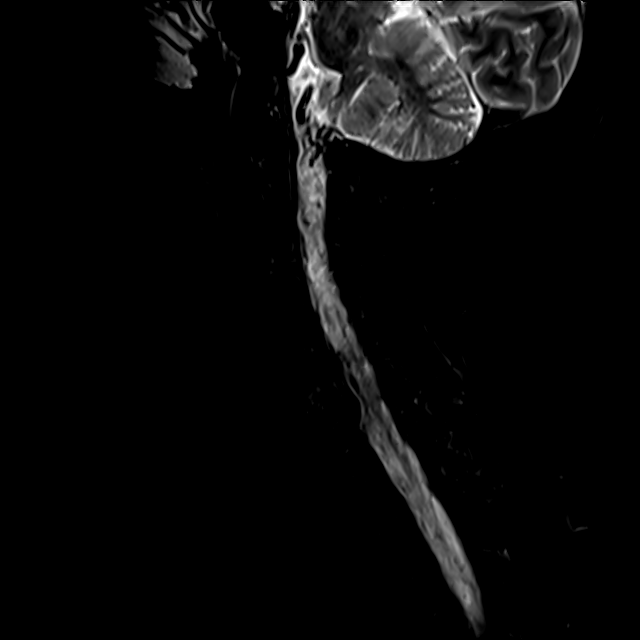
[im 12/15]
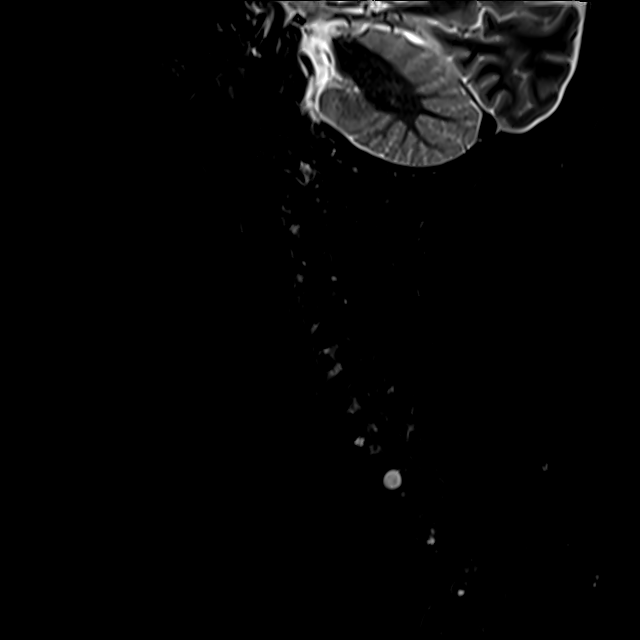

[Series 8: T2 · axial · 3.0mm · 0.50mm/px · z∈[-57,+31]mm · 8 of 30 slices shown (2 of 2)]
[im 1/30]
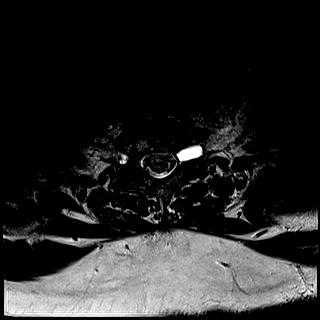
[im 5/30]
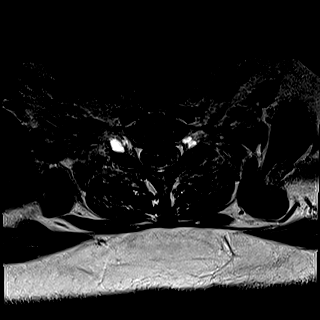
[im 9/30]
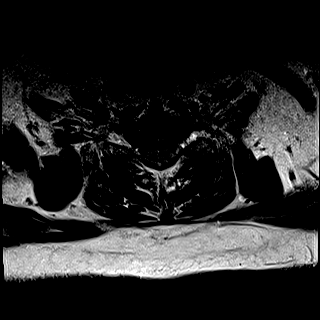
[im 14/30]
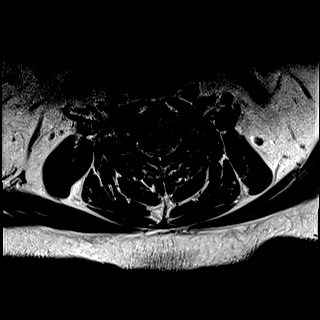
[im 16/30]
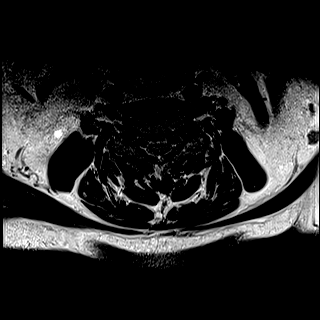
[im 21/30]
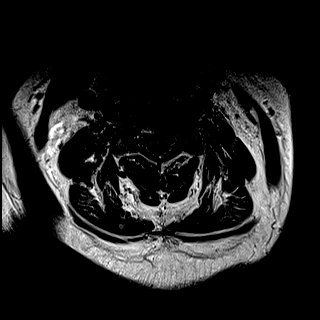
[im 25/30]
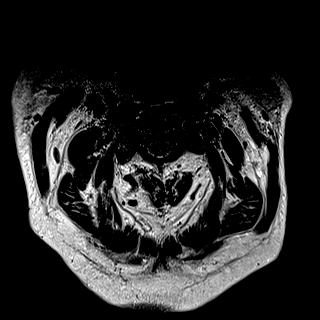
[im 30/30]
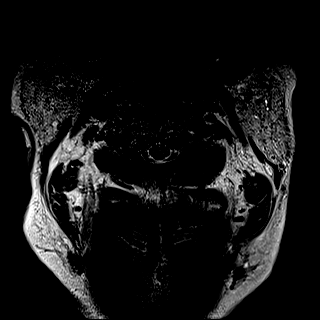

[27 of 48 positions shown; findings below may reference images not displayed]

FINDINGS: Alignment: Straightening of the normal cervical lordosis. No
listhesis.

Vertebrae: Diffuse marrow heterogeneity without focal lesion. No
fracture or evidence of discitis.

Cord: Normal signal and morphology.

Posterior Fossa, vertebral arteries, paraspinal tissues: Multiple
perineural cysts. Otherwise negative.

Disc levels:

C2-C3:  Negative.

C3-C4:  Mild disc bulging.  No stenosis.

C4-C5: Mild disc bulging and bilateral facet arthropathy no
stenosis.

C5-C6:  Mild disc bulging.  No stenosis.

C6-C7: Small posterior disc osteophyte complex. Right uncovertebral
hypertrophy. Mild right neuroforaminal stenosis. No spinal canal or
left neuroforaminal stenosis.

C7-T1:  Negative.

The visualized upper thoracic spine is unremarkable.
IMPRESSION: 1. Mild multilevel cervical spondylosis as described above. No
high-grade stenosis or impingement.

## 2022-11-28 NOTE — Progress Notes (Unsigned)
11/29/22- 19 F never smoker, retired Engineer, civil (consulting),  with remote hx OSA/ CPAP broke, never replaced. Now self referred. Complicated by HTN, Chronic Rhinitis, DM2,  Recurrent Otitis Media, Eustachian Dysfunction, Endometrial Cancer, Hyperlipidemia, Anxiety, Glaucoma,  -Albuterol hfa,  Epworth score-1 Body weight today-200 lbs OSA- She is sure she needs CPAP- tired and snore. Wakes gasping.  Discussed process of getting Home Sleep Test. Dyspnea on exertion- Did have albuterol- some help. Occasional cough or wheeze. Not acutely ill. Has no inhaler now. We can replace, then assess needs.  Prior to Admission medications   Medication Sig Start Date End Date Taking? Authorizing Provider  Alcohol Swabs (B-D SINGLE USE SWABS REGULAR) PADS TEST BS UP TO FOUR TIMES DAILY Dx E11.9 08/25/21  Yes Martin, Mary-Margaret, FNP  ALPHAGAN P 0.1 % SOLN APPLY 1 DROP TO EYE 2 TIMES DAILY. 10/11/22  Yes Daphine Deutscher, Mary-Margaret, FNP  aspirin 81 MG tablet Take 81 mg by mouth daily.   Yes [provider]  Blood Glucose Calibration (TRUE METRIX LEVEL 1) Low SOLN Use with glucose machine Dx E11.9, 08/25/21  Yes Daphine Deutscher, Mary-Margaret, FNP  Blood Glucose Monitoring Suppl (TRUE METRIX METER) w/Device KIT TEST BS UP TO FOUR TIMES DAILY Dx E11.9 11/17/20  Yes Daphine Deutscher, Mary-Margaret, FNP  Continuous Blood Gluc Receiver (FREESTYLE LIBRE 2 READER) DEVI Use to test blood sugar 4-6 times daily as directed DX: E11.9 09/07/19  Yes Martin, Mary-Margaret, FNP  [facility] LUBRICANT EYE DROPS 0.6 % SOLN Apply 1 drop to eye 2 (two) times daily. 02/27/21  Yes [provider]  dapagliflozin propanediol (FARXIGA) 5 MG TABS tablet Take 1 tablet (5 mg total) by mouth daily. 11/11/22  Yes Daphine Deutscher, Mary-Margaret, FNP  diclofenac (VOLTAREN) 75 MG EC tablet TAKE 1 TABLET BY MOUTH TWICE A DAY AS NEEDED 04/28/18  Yes Daphine Deutscher, Mary-Margaret, FNP  dorzolamide (TRUSOPT) 2 % ophthalmic solution Place 2 drops into both eyes 2 (two) times daily.   Yes [provider]  fenofibrate 160 MG tablet Take 1 tablet (160 mg total) by mouth daily. 11/11/22  Yes Daphine Deutscher, Mary-Margaret, FNP  fluticasone (FLONASE) 50 MCG/ACT nasal spray SPRAY 2 SPRAYS INTO EACH NOSTRIL EVERY DAY 09/23/22  Yes Daphine Deutscher, Mary-Margaret, FNP  furosemide (LASIX) 20 MG tablet TAKE 1 TABLET BY MOUTH EVERY DAY 11/11/22  Yes Daphine Deutscher, Mary-Margaret, FNP  gabapentin (NEURONTIN) 100 MG capsule Take 1 capsule (100mg ) by mouth in the morning, 1 capsule (100mg ) in the afternoon, & 2 capsules (200mg ) in the evening 11/11/22  Yes Daphine Deutscher, Mary-Margaret, FNP  glucose blood (TRUE METRIX BLOOD GLUCOSE TEST) test strip TEST BS UP TO FOUR TIMES DAILY Dx E11.9 08/25/21  Yes Daphine Deutscher, Mary-Margaret, FNP  ibuprofen (ADVIL) 800 MG tablet TAKE 1 TABLET BY MOUTH EVERY 8 HOURS AS NEEDED 08/25/22  Yes Daphine Deutscher, Mary-Margaret, FNP  insulin degludec (TRESIBA FLEXTOUCH) 200 UNIT/ML FlexTouch Pen Inject 90 Units into the skin 2 (two) times daily. Gets via novo nordisk patient assistance   Yes [provider]  Insulin Pen Needle (B-D UF III MINI PEN NEEDLES) 31G X 5 MM MISC USE WITH SLIDING SCALE HUMALOG 4 TIMES DAILY Dx E11.9 05/26/22  Yes Daphine Deutscher, Mary-Margaret, FNP  insulin regular (NOVOLIN R) 100 units/mL injection INJECT 22UNITS 4 TIMES A DAY PER SLIDING SCALE 04/09/21  Yes Daphine Deutscher, Mary-Margaret, FNP  Insulin Syringe-Needle U-100 (BD INSULIN SYRINGE U/F) 31G X 5/16" 1 ML MISC UAD w/ Lantus and Humalog insulin sliding scale Dx E11.9 05/26/22  Yes Daphine Deutscher, Mary-Margaret, FNP  Lancet Devices (TRUEDRAW LANCING DEVICE) MISC TEST  BS UP TO FOUR TIMES DAILY Dx E11.9 08/25/21  Yes Daphine Deutscher, Mary-Margaret, FNP  lisinopril (ZESTRIL) 20 MG tablet Take 1 tablet (20 mg total) by mouth daily. 11/11/22  Yes Martin, Mary-Margaret, FNP  LORazepam (ATIVAN) 1 MG tablet Take 1 tablet (1 mg total) by mouth 3 (three) times daily. 11/11/22  Yes Daphine Deutscher, Mary-Margaret, FNP  meclizine (ANTIVERT) 25 MG tablet Take 1 tablet (25 mg total) by mouth 3  (three) times daily as needed for dizziness. 07/31/19  Yes Martin, Mary-Margaret, FNP  mirabegron ER (MYRBETRIQ) 50 MG TB24 tablet Take 1 tablet (50 mg total) by mouth daily. 11/11/22  Yes Daphine Deutscher, Mary-Margaret, FNP  mupirocin cream (BACTROBAN) 2 % APPLY TO AFFECTED AREA TWICE A DAY 06/12/22  Yes Daphine Deutscher, Mary-Margaret, FNP  nystatin (MYCOSTATIN) 100000 UNIT/ML suspension Take 5 mLs (500,000 Units total) by mouth 4 (four) times daily. 05/31/22  Yes Daphine Deutscher, Mary-Margaret, FNP  ondansetron [facility]) 4 MG tablet  09/30/14  Yes [provider]  Semaglutide,0.25 or 0.5MG /DOS, 2 MG/3ML SOPN Inject 0.25 mg into the skin once a week. X4 weeks then increase to 0.5mg  sq weekly.  Start after surgery on 11/04/21. DX: E11.65 11/11/22  Yes Martin, Mary-Margaret, FNP  sertraline (ZOLOFT) 100 MG tablet Take 2 tablets (200 mg total) by mouth daily. 11/11/22  Yes Daphine Deutscher, Mary-Margaret, FNP  TRUEplus Lancets 33G MISC TEST BS UP TO FOUR TIMES DAILY Dx E11.9 08/25/21  Yes Daphine Deutscher, Mary-Margaret, FNP  Vitamin D, Ergocalciferol, (DRISDOL) 1.25 MG (50000 UNIT) CAPS capsule TAKE 1 CAPSULE EVERY 7 DAYS 08/30/22  Yes Daphine Deutscher, Mary-Margaret, FNP  albuterol (VENTOLIN HFA) 108 (90 Base) MCG/ACT inhaler Inhale 2 puffs into the lungs every 6 (six) hours as needed for wheezing or shortness of breath. 11/29/22   Waymon Budge, MD   Past Medical History:  Diagnosis Date   Benign breast lumps    Cancer (HCC) 2007   uterine   Cervical radiculopathy    Cervical radiculopathy    Change in bowel habits    soft stools   Depression    Diabetes mellitus without complication (HCC)    type 2 diabetic   History of anal fissures    Hx of adenomatous colonic polyps 03/20/2018   Hx of gallstones    Hyperlipidemia    Hypertension    Schizoid personality disorder (HCC)    pt not taking her meds  (pt states this is an inaccurate diagnosis and wants it removed) 03-15-2018   Skin cancer    on arms/right hand   Sleep apnea    Past Surgical  History:  Procedure Laterality Date   ABDOMINAL HYSTERECTOMY     due to uterine cancer   BREAST SURGERY     left breast biopsy/benign   TUBAL LIGATION     Family History  Problem Relation Age of Onset   Diabetes Mother    Blindness Mother        related to diabetes   Stroke Mother    Heart disease Father        MI   Heart attack Father    Heart defect Father    Breast cancer Sister    Diabetes Brother        diet controlled   Colon cancer Neg Hx    Esophageal cancer Neg Hx    Rectal cancer Neg Hx    Stomach cancer Neg Hx    Social History   Socioeconomic History   Marital status: Widowed    Spouse name: Not on file  Number of children: 5   Years of education: 13   Highest education level: Associate degree: occupational, Scientist, product/process development, or vocational program  Occupational History   Occupation: retired  Tobacco Use   Smoking status: Never    Passive exposure: Never   Smokeless tobacco: Never  Vaping Use   Vaping status: Never Used  Substance and Sexual Activity   Alcohol use: No   Drug use: No   Sexual activity: Not Currently    Birth control/protection: Post-menopausal  Other Topics Concern   Not on file  Social History Narrative   Son, Daughter, and granddaughter live with her   Her daughter is going through drug addiction treatment   Lives in one level home with basement - she doesn't use stairs at all.   She has been very withdrawn and depressed since her husband passed.   Social Determinants of Health   Financial Resource Strain: Low Risk  (03/24/2022)   Overall Financial Resource Strain (CARDIA)    Difficulty of Paying Living Expenses: Not hard at all  Food Insecurity: No Food Insecurity (03/24/2022)   Hunger Vital Sign    Worried About Running Out of Food in the Last Year: Never true    Ran Out of Food in the Last Year: Never true  Transportation Needs: No Transportation Needs (03/24/2022)   PRAPARE - Administrator, Civil Service (Medical):  No    Lack of Transportation (Non-Medical): No  Physical Activity: Inactive (03/24/2022)   Exercise Vital Sign    Days of Exercise per Week: 0 days    Minutes of Exercise per Session: 0 min  Stress: No Stress Concern Present (03/24/2022)   [employer] of Occupational Health - Occupational Stress Questionnaire    Feeling of Stress : Only a little  Social Connections: Moderately Isolated (03/24/2022)   Social Connection and Isolation Panel [NHANES]    Frequency of Communication with Friends and Family: More than three times a week    Frequency of Social Gatherings with Friends and Family: More than three times a week    Attends Religious Services: 1 to 4 times per year    Active Member of [employer] or Organizations: No    Attends Banker Meetings: Never    Marital Status: Widowed  Intimate Partner Violence: Not At Risk (03/24/2022)   Humiliation, Afraid, Rape, and Kick questionnaire    Fear of Current or Ex-Partner: No    Emotionally Abused: No    Physically Abused: No    Sexually Abused: No   ROS-see HPI   + = positive Constitutional:    weight loss, night sweats, fevers, chills, fatigue, lassitude. HEENT:    headaches, difficulty swallowing, tooth/dental problems, sore throat,       sneezing, itching, ear ache, nasal congestion, post nasal drip, snoring CV:    chest pain, orthopnea, PND, swelling in lower extremities, anasarca,                                  dizziness, palpitations Resp:   shortness of breath with exertion or at rest.                productive cough,   non-productive cough, coughing up of blood.              change in color of mucus.  wheezing.   Skin:    rash or lesions. GI:  No-   heartburn,  indigestion, abdominal pain, nausea, vomiting, diarrhea,                 change in bowel habits, loss of appetite GU: dysuria, change in color of urine, no urgency or frequency.   flank pain. MS:   joint pain, stiffness, decreased range of motion, back  pain. Neuro-     nothing unusual Psych:  change in mood or affect.  depression or anxiety.   memory loss.  OBJ- Physical Exam General- Alert, Oriented, Affect-appropriate, Distress- none acute, +obese Skin- rash-none, lesions- none, excoriation- none Lymphadenopathy- none Head- atraumatic            Eyes- Gross vision intact, PERRLA, conjunctivae and secretions clear            Ears- Hearing, canals-normal            Nose- Clear, no-Septal dev, mucus, polyps, erosion, perforation             Throat- Mallampati III , mucosa clear , drainage- none, tonsils- atrophic, +teeth, Neck- flexible , trachea midline, no stridor , thyroid nl, carotid no bruit Chest - symmetrical excursion, unlabored           Heart/CV- RRR ,  murmur+1/6 S , no gallop  , no rub, nl s1 s2                           - JVD- none , edema- none, stasis changes- none, varices- none           Lung- clear to P&A, wheeze- none, cough- none , dullness-none, rub- none           Chest wall-  Abd-  Br/ Gen/ Rectal- Not done, not indicated Extrem-+cane Neuro- grossly intact to observation

## 2022-11-29 ENCOUNTER — Encounter: Payer: Self-pay | Admitting: Internal Medicine

## 2022-11-29 ENCOUNTER — Ambulatory Visit (INDEPENDENT_AMBULATORY_CARE_PROVIDER_SITE_OTHER): Payer: Medicare HMO | Admitting: Internal Medicine

## 2022-11-29 VITALS — BP 160/65 | HR 101 | Temp 97.9°F | Resp 20 | Ht 64.0 in | Wt 200.0 lb

## 2022-11-29 DIAGNOSIS — J4520 Mild intermittent asthma, uncomplicated: Secondary | ICD-10-CM | POA: Diagnosis not present

## 2022-11-29 DIAGNOSIS — G4733 Obstructive sleep apnea (adult) (pediatric): Secondary | ICD-10-CM | POA: Diagnosis not present

## 2022-11-29 DIAGNOSIS — R0681 Apnea, not elsewhere classified: Secondary | ICD-10-CM

## 2022-11-29 MED ORDER — ALBUTEROL SULFATE HFA 108 (90 BASE) MCG/ACT IN AERS: 2.0000 | INHALATION_SPRAY | Freq: Four times a day (QID) | RESPIRATORY_TRACT | 12 refills | Status: DC | PRN

## 2022-11-29 NOTE — Patient Instructions (Signed)
Order- schedule home sleep test    dx OSA  Script sent for albuterol inhaler    inhale 2 puffs every 6 hours if needed

## 2022-11-29 NOTE — Assessment & Plan Note (Signed)
Mild intermittent uncomplicated Plan- refill albuterol rescue inhaler. We will reassess in future as needed.

## 2022-11-29 NOTE — Assessment & Plan Note (Signed)
Expect her to requalify for CPAP Plan- sleep study

## 2022-12-11 DIAGNOSIS — E1165 Type 2 diabetes mellitus with hyperglycemia: Secondary | ICD-10-CM | POA: Diagnosis not present

## 2022-12-13 ENCOUNTER — Telehealth: Payer: Self-pay | Admitting: Internal Medicine

## 2022-12-13 NOTE — Telephone Encounter (Signed)
Provided pt w Snap sleep study phone number. NFN

## 2022-12-16 ENCOUNTER — Telehealth: Payer: Self-pay

## 2022-12-16 NOTE — Telephone Encounter (Signed)
Received Novolin R samples from patient assistance. Notified patient and left in fridge for pick up.

## 2022-12-19 ENCOUNTER — Encounter (INDEPENDENT_AMBULATORY_CARE_PROVIDER_SITE_OTHER): Payer: Medicare HMO

## 2022-12-19 DIAGNOSIS — G4730 Sleep apnea, unspecified: Secondary | ICD-10-CM | POA: Diagnosis not present

## 2022-12-19 DIAGNOSIS — R0681 Apnea, not elsewhere classified: Secondary | ICD-10-CM

## 2023-01-04 ENCOUNTER — Telehealth: Payer: Self-pay | Admitting: Nurse Practitioner

## 2023-01-04 ENCOUNTER — Telehealth: Payer: Self-pay | Admitting: Pharmacist

## 2023-01-04 NOTE — Telephone Encounter (Signed)
Do you have anything available?

## 2023-01-04 NOTE — Telephone Encounter (Signed)
We are completely out of samples Patient will need to call [phone] to request shipment We will mail her application for re-enrollment for 2025 (check mail for [employer] envelop within the month)

## 2023-01-04 NOTE — Telephone Encounter (Signed)
Can we make sure patient is re-enrolled for novo PAP? Hasn't had ozempic delivered; recently picked up insulin (but ozempic was not shipped) I will give patient the phone number to call

## 2023-01-05 ENCOUNTER — Encounter: Payer: Self-pay | Admitting: Pharmacist

## 2023-01-05 ENCOUNTER — Other Ambulatory Visit: Payer: Self-pay | Admitting: Nurse Practitioner

## 2023-01-05 DIAGNOSIS — E1165 Type 2 diabetes mellitus with hyperglycemia: Secondary | ICD-10-CM

## 2023-01-05 NOTE — Telephone Encounter (Signed)
Attempted to call pt , unable to hear pt she hung up the phone   Please relay message if pt calls back

## 2023-01-11 DIAGNOSIS — G4733 Obstructive sleep apnea (adult) (pediatric): Secondary | ICD-10-CM | POA: Diagnosis not present

## 2023-01-14 NOTE — Telephone Encounter (Signed)
Done

## 2023-01-20 ENCOUNTER — Ambulatory Visit: Payer: Medicare HMO | Admitting: Nurse Practitioner

## 2023-01-25 ENCOUNTER — Telehealth: Payer: Self-pay

## 2023-01-25 NOTE — Telephone Encounter (Signed)
Mailed novo renewal application for ozempic, novolin, and tresiba.

## 2023-01-25 NOTE — Telephone Encounter (Signed)
Mailed novo nordisk renewal application to patients home for Ozempic, Novolin R, and Tresiba U200.

## 2023-01-26 ENCOUNTER — Encounter: Payer: Self-pay | Admitting: Pharmacist

## 2023-01-31 ENCOUNTER — Encounter: Payer: Self-pay | Admitting: Nurse Practitioner

## 2023-01-31 ENCOUNTER — Ambulatory Visit (INDEPENDENT_AMBULATORY_CARE_PROVIDER_SITE_OTHER): Payer: Medicare HMO | Admitting: Nurse Practitioner

## 2023-01-31 VITALS — BP 140/61 | HR 79 | Temp 97.5°F | Resp 20 | Ht 64.0 in | Wt 202.0 lb

## 2023-01-31 DIAGNOSIS — R7989 Other specified abnormal findings of blood chemistry: Secondary | ICD-10-CM

## 2023-01-31 DIAGNOSIS — Z23 Encounter for immunization: Secondary | ICD-10-CM

## 2023-01-31 DIAGNOSIS — E1169 Type 2 diabetes mellitus with other specified complication: Secondary | ICD-10-CM | POA: Diagnosis not present

## 2023-01-31 DIAGNOSIS — E785 Hyperlipidemia, unspecified: Secondary | ICD-10-CM | POA: Diagnosis not present

## 2023-01-31 DIAGNOSIS — F411 Generalized anxiety disorder: Secondary | ICD-10-CM | POA: Diagnosis not present

## 2023-01-31 DIAGNOSIS — M8588 Other specified disorders of bone density and structure, other site: Secondary | ICD-10-CM

## 2023-01-31 DIAGNOSIS — E1165 Type 2 diabetes mellitus with hyperglycemia: Secondary | ICD-10-CM | POA: Diagnosis not present

## 2023-01-31 DIAGNOSIS — E1142 Type 2 diabetes mellitus with diabetic polyneuropathy: Secondary | ICD-10-CM | POA: Diagnosis not present

## 2023-01-31 DIAGNOSIS — F339 Major depressive disorder, recurrent, unspecified: Secondary | ICD-10-CM

## 2023-01-31 DIAGNOSIS — Z7985 Long-term (current) use of injectable non-insulin antidiabetic drugs: Secondary | ICD-10-CM

## 2023-01-31 DIAGNOSIS — N393 Stress incontinence (female) (male): Secondary | ICD-10-CM

## 2023-01-31 DIAGNOSIS — I10 Essential (primary) hypertension: Secondary | ICD-10-CM

## 2023-01-31 DIAGNOSIS — Z6832 Body mass index (BMI) 32.0-32.9, adult: Secondary | ICD-10-CM

## 2023-01-31 LAB — BAYER DCA HB A1C WAIVED: HB A1C (BAYER DCA - WAIVED): 8.3 % — ABNORMAL HIGH (ref 4.8–5.6)

## 2023-01-31 MED ORDER — OZEMPIC (0.25 OR 0.5 MG/DOSE) 2 MG/3ML ~~LOC~~ SOPN: 0.5000 mg | PEN_INJECTOR | SUBCUTANEOUS | 0 refills | Status: DC

## 2023-01-31 MED ORDER — LORAZEPAM 1 MG PO TABS
1.0000 mg | ORAL_TABLET | Freq: Three times a day (TID) | ORAL | 2 refills | Status: DC
Start: 2023-01-31 — End: 2023-06-20

## 2023-01-31 NOTE — Progress Notes (Signed)
Subjective:    Patient ID: Alexandra Hunt, female    DOB: [birth year], 77 y.o.   MRN: [id number]   Chief Complaint: No chief complaint on file.    HPI:  Alexandra Hunt is a 77 y.o. who identifies as a female who was assigned female at birth.   Social history: Lives with: kids and grandkids Work history: retired Scientist, clinical (histocompatibility and immunogenetics) in today for follow up of the following chronic medical issues:  1. Essential hypertension, malignant No c/o chest pain, sob or headache. Does not check blood pressure at home very often. When she does check it it is in the 130's systolic. BP Readings from Last 3 Encounters:  01/31/23 (!) 140/61  11/29/22 (!) 160/65  11/11/22 118/61      2. Hyperlipidemia associated with type 2 diabetes mellitus (HCC) Does not watch diet and does no dedicated exercise. Lab Results  Component Value Date   CHOL 128 11/11/2022   HDL 34 (L) 11/11/2022   LDLCALC 67 11/11/2022   TRIG 158 (H) 11/11/2022   CHOLHDL 3.8 11/11/2022     3. Uncontrolled type 2 diabetes mellitus with hyperglycemia (HCC) Fasting blood sugars are running around 200 most of the time. She has ran out of ozempic and pharmacy has not sent it to her.  Lab Results  Component Value Date   HGBA1C 6.9 (H) 11/11/2022     4. Diabetic polyneuropathy associated with type 2 diabetes mellitus (HCC) Has numbness and tingling in bil feet all the time.   5. Episode of recurrent major depressive disorder, unspecified depression episode severity (HCC) Is on zoloft daily and is doing well.    01/31/2023    3:14 PM 11/11/2022    3:28 PM 04/09/2022    3:58 PM  Depression screen PHQ 2/9  Decreased Interest 2 0 3  Down, Depressed, Hopeless 1 0 2  PHQ - 2 Score 3 0 5  Altered sleeping 3 0 2  Tired, decreased energy 1 0 2  Change in appetite 1 0 2  Feeling bad or failure about yourself  1 0 2  Trouble concentrating 0 0 0  Moving slowly or fidgety/restless 0 0 0  Suicidal thoughts 0 0 0  PHQ-9 Score 9 0 13   Difficult doing work/chores Somewhat difficult Not difficult at all Somewhat difficult      6. GAD (generalized anxiety disorder) Stays anxious. Her living situation makes her nervous all the time.    01/31/2023    3:15 PM 11/11/2022    3:28 PM 04/09/2022    3:58 PM 12/28/2021    3:49 PM  GAD 7 : Generalized Anxiety Score  Nervous, Anxious, on Edge 1 0 2 2  Control/stop worrying 2 0 2 2  Worry too much - different things 2 0 2 2  Trouble relaxing 1 0 1 1  Restless 0 0 1 1  Easily annoyed or irritable 0 0 0 0  Afraid - awful might happen 1 0 1 1  Total GAD 7 Score 7 0 9 9  Anxiety Difficulty Somewhat difficult Not difficult at all Somewhat difficult Somewhat difficult      7. Low serum vitamin D Is on daily vitamin d supplement when she remembers to take  8. Stress incontinence of urine Is on myrbetriq which is working well for her  9. Osteopenia of lumbar spine Last dexascan was done 07/22/22. T score was -1.7  10. BMI 32.0-32.9,adult No recent weight changes Wt Readings from Last 3  Encounters:  01/31/23 202 lb (91.6 kg)  11/29/22 200 lb (90.7 kg)  11/11/22 200 lb (90.7 kg)   BMI Readings from Last 3 Encounters:  01/31/23 34.67 kg/m  11/29/22 34.33 kg/m  11/11/22 34.33 kg/m      New complaints: Patient still c/o right leg pain along shin. She has lost her balance 2 times. Has been using a cane  to walk.  Allergies  Allergen Reactions   Penicillins     SOB,rash   Ivp Dye [Iodinated Contrast Media]     Burning at IV site   Statins     myalgias   Codeine     nauseated   Latex     rash   Sulfa Antibiotics     nauseated   Outpatient Encounter Medications as of 01/31/2023  Medication Sig   albuterol (VENTOLIN HFA) 108 (90 Base) MCG/ACT inhaler Inhale 2 puffs into the lungs every 6 (six) hours as needed for wheezing or shortness of breath.   Alcohol Swabs (B-D SINGLE USE SWABS REGULAR) PADS TEST BS UP TO FOUR TIMES DAILY Dx E11.9   ALPHAGAN P 0.1 %  SOLN APPLY 1 DROP TO EYE 2 TIMES DAILY.   aspirin 81 MG tablet Take 81 mg by mouth daily.   Blood Glucose Calibration (TRUE METRIX LEVEL 1) Low SOLN Use with glucose machine Dx E11.9,   Blood Glucose Monitoring Suppl (TRUE METRIX METER) w/Device KIT TEST BS UP TO FOUR TIMES DAILY Dx E11.9   Continuous Blood Gluc Receiver (FREESTYLE LIBRE 2 READER) DEVI Use to test blood sugar 4-6 times daily as directed DX: E11.9   CVS LUBRICANT EYE DROPS 0.6 % SOLN Apply 1 drop to eye 2 (two) times daily.   dapagliflozin propanediol (FARXIGA) 5 MG TABS tablet Take 1 tablet (5 mg total) by mouth daily.   diclofenac (VOLTAREN) 75 MG EC tablet TAKE 1 TABLET BY MOUTH TWICE A DAY AS NEEDED   dorzolamide (TRUSOPT) 2 % ophthalmic solution Place 2 drops into both eyes 2 (two) times daily.   fenofibrate 160 MG tablet Take 1 tablet (160 mg total) by mouth daily.   fluticasone (FLONASE) 50 MCG/ACT nasal spray SPRAY 2 SPRAYS INTO EACH NOSTRIL EVERY DAY   furosemide (LASIX) 20 MG tablet TAKE 1 TABLET BY MOUTH EVERY DAY   gabapentin (NEURONTIN) 100 MG capsule Take 1 capsule (100mg ) by mouth in the morning, 1 capsule (100mg ) in the afternoon, & 2 capsules (200mg ) in the evening   glucose blood (TRUE METRIX BLOOD GLUCOSE TEST) test strip TEST BS UP TO FOUR TIMES DAILY Dx E11.9   ibuprofen (ADVIL) 800 MG tablet TAKE 1 TABLET BY MOUTH EVERY 8 HOURS AS NEEDED   insulin degludec (TRESIBA FLEXTOUCH) 200 UNIT/ML FlexTouch Pen Inject 90 Units into the skin 2 (two) times daily. Gets via novo nordisk patient assistance   Insulin Pen Needle (B-D UF III MINI PEN NEEDLES) 31G X 5 MM MISC USE WITH SLIDING SCALE HUMALOG 4 TIMES DAILY Dx E11.9   insulin regular (NOVOLIN R) 100 units/mL injection INJECT 22UNITS 4 TIMES A DAY PER SLIDING SCALE   Insulin Syringe-Needle U-100 (BD INSULIN SYRINGE U/F) 31G X 5/16" 1 ML MISC UAD w/ Lantus and Humalog insulin sliding scale Dx E11.9   Lancet Devices (TRUEDRAW LANCING DEVICE) MISC TEST BS UP TO FOUR  TIMES DAILY Dx E11.9   lisinopril (ZESTRIL) 20 MG tablet Take 1 tablet (20 mg total) by mouth daily.   LORazepam (ATIVAN) 1 MG tablet Take 1 tablet (1 mg  total) by mouth 3 (three) times daily.   meclizine (ANTIVERT) 25 MG tablet Take 1 tablet (25 mg total) by mouth 3 (three) times daily as needed for dizziness.   mirabegron ER (MYRBETRIQ) 50 MG TB24 tablet Take 1 tablet (50 mg total) by mouth daily.   mupirocin cream (BACTROBAN) 2 % APPLY TO AFFECTED AREA TWICE A DAY   nystatin (MYCOSTATIN) 100000 UNIT/ML suspension Take 5 mLs (500,000 Units total) by mouth 4 (four) times daily.   ondansetron (ZOFRAN) 4 MG tablet    Semaglutide,0.25 or 0.5MG /DOS, (OZEMPIC, 0.25 OR 0.5 MG/DOSE,) 2 MG/3ML SOPN INJECT 0.25 MG INTO SKIN ONCE A WEEK X4 WEEKS THEN UP TO 0.5MG  WEEKLY DX: E11.65   sertraline (ZOLOFT) 100 MG tablet Take 2 tablets (200 mg total) by mouth daily.   TRUEplus Lancets 33G MISC TEST BS UP TO FOUR TIMES DAILY Dx E11.9   Vitamin D, Ergocalciferol, (DRISDOL) 1.25 MG (50000 UNIT) CAPS capsule TAKE 1 CAPSULE EVERY 7 DAYS   No facility-administered encounter medications on file as of 01/31/2023.    Past Surgical History:  Procedure Laterality Date   ABDOMINAL HYSTERECTOMY     due to uterine cancer   BREAST SURGERY     left breast biopsy/benign   TUBAL LIGATION      Family History  Problem Relation Age of Onset   Diabetes Mother    Blindness Mother        related to diabetes   Stroke Mother    Heart disease Father        MI   Heart attack Father    Heart defect Father    Breast cancer Sister    Diabetes Brother        diet controlled   Colon cancer Neg Hx    Esophageal cancer Neg Hx    Rectal cancer Neg Hx    Stomach cancer Neg Hx       Controlled substance contract: n/a     Review of Systems  Constitutional:  Negative for diaphoresis.  Eyes:  Negative for pain.  Respiratory:  Negative for shortness of breath.   Cardiovascular:  Negative for chest pain, palpitations  and leg swelling.  Gastrointestinal:  Negative for abdominal pain.  Endocrine: Negative for polydipsia.  Skin:  Negative for rash.  Neurological:  Negative for dizziness, weakness and headaches.  Hematological:  Does not bruise/bleed easily.  All other systems reviewed and are negative.      Objective:   Physical Exam Vitals and nursing note reviewed.  Constitutional:      General: She is not in acute distress.    Appearance: Normal appearance. She is well-developed.  HENT:     Head: Normocephalic.     Right Ear: Tympanic membrane normal.     Left Ear: Tympanic membrane normal.     Nose: Nose normal.     Mouth/Throat:     Mouth: Mucous membranes are moist.  Eyes:     Pupils: Pupils are equal, round, and reactive to light.  Neck:     Vascular: No carotid bruit or JVD.  Cardiovascular:     Rate and Rhythm: Normal rate and regular rhythm.     Heart sounds: Normal heart sounds.  Pulmonary:     Effort: Pulmonary effort is normal. No respiratory distress.     Breath sounds: Normal breath sounds. No wheezing or rales.  Chest:     Chest wall: No tenderness.  Abdominal:     General: Bowel sounds are normal. There  is no distension or abdominal bruit.     Palpations: Abdomen is soft. There is no hepatomegaly, splenomegaly, mass or pulsatile mass.     Tenderness: There is no abdominal tenderness.  Musculoskeletal:        General: Normal range of motion.     Cervical back: Normal range of motion and neck supple.  Lymphadenopathy:     Cervical: No cervical adenopathy.  Skin:    General: Skin is warm and dry.  Neurological:     Mental Status: She is alert and oriented to person, place, and time.     Deep Tendon Reflexes: Reflexes are normal and symmetric.  Psychiatric:        Behavior: Behavior normal.        Thought Content: Thought content normal.        Judgment: Judgment normal.     BP (!) 140/61   Pulse 79   Temp (!) 97.5 F (36.4 C) (Temporal)   Resp 20   Ht 5\' 4"   (1.626 m)   Wt 202 lb (91.6 kg)   SpO2 96%   BMI 34.67 kg/m   HGBa1c 8.3%      Assessment & Plan:   AJANAE FUENTE comes in today with chief complaint of Medical Management of Chronic Issues   Diagnosis and orders addressed:  1. Essential hypertension, malignant Low sodium diet - CBC with Differential/Platelet - CMP14+EGFR  2. Hyperlipidemia associated with type 2 diabetes mellitus (HCC) Low fat diet - Lipid panel  3. Uncontrolled type 2 diabetes mellitus with hyperglycemia (HCC) Get back on ozempic - Bayer DCA Hb A1c Waived - Microalbumin / creatinine urine ratio - Semaglutide,0.25 or 0.5MG /DOS, (OZEMPIC, 0.25 OR 0.5 MG/DOSE,) 2 MG/3ML SOPN; Inject 0.5 mg into the skin once a week.  Dispense: 9 mL; Refill: 0  4. Diabetic polyneuropathy associated with type 2 diabetes mellitus (HCC) Check feet daily  5. Episode of recurrent major depressive disorder, unspecified depression episode severity (HCC) Stress management  6. GAD (generalized anxiety disorder) Stress management - LORazepam (ATIVAN) 1 MG tablet; Take 1 tablet (1 mg total) by mouth 3 (three) times daily.  Dispense: 90 tablet; Refill: 2  7. Low serum vitamin D Vitamin d supplement  8. Stress incontinence of urine  9. Osteopenia of lumbar spine Weight bearing exercise  10. BMI 32.0-32.9,adult Discussed diet and exercise for person with BMI >25 Will recheck weight in 3-6 months    Labs pending Health Maintenance reviewed Diet and exercise encouraged  Follow up plan: 3 months   Mary-Margaret Daphine Deutscher, FNP

## 2023-02-01 LAB — CMP14+EGFR
ALT: 11 [IU]/L (ref 0–32)
AST: 19 [IU]/L (ref 0–40)
Albumin: 4.4 g/dL (ref 3.8–4.8)
Alkaline Phosphatase: 94 [IU]/L (ref 44–121)
BUN/Creatinine Ratio: 15 (ref 12–28)
BUN: 18 mg/dL (ref 8–27)
Bilirubin Total: <0.2 mg/dL (ref 0.0–1.2)
CO2: 21 mmol/L (ref 20–29)
Calcium: 10.1 mg/dL (ref 8.7–10.3)
Chloride: 102 mmol/L (ref 96–106)
Creatinine, Ser: 1.2 mg/dL — ABNORMAL HIGH (ref 0.57–1.00)
Globulin, Total: 3.4 g/dL (ref 1.5–4.5)
Glucose: 185 mg/dL — ABNORMAL HIGH (ref 70–99)
Potassium: 4.8 mmol/L (ref 3.5–5.2)
Sodium: 143 mmol/L (ref 134–144)
Total Protein: 7.8 g/dL (ref 6.0–8.5)
eGFR: 47 mL/min/{1.73_m2} — ABNORMAL LOW (ref >59)

## 2023-02-01 LAB — CBC WITH DIFFERENTIAL/PLATELET
Basophils Absolute: 0.1 10*3/uL (ref 0.0–0.2)
Basos: 1 %
EOS (ABSOLUTE): 0.2 10*3/uL (ref 0.0–0.4)
Eos: 1 %
Hematocrit: 47.2 % — ABNORMAL HIGH (ref 34.0–46.6)
Hemoglobin: 14.5 g/dL (ref 11.1–15.9)
Immature Grans (Abs): 0 10*3/uL (ref 0.0–0.1)
Immature Granulocytes: 0 %
Lymphocytes Absolute: 5.4 10*3/uL — ABNORMAL HIGH (ref 0.7–3.1)
Lymphs: 32 %
MCH: 25.8 pg — ABNORMAL LOW (ref 26.6–33.0)
MCHC: 30.7 g/dL — ABNORMAL LOW (ref 31.5–35.7)
MCV: 84 fL (ref 79–97)
Monocytes Absolute: 1 10*3/uL — ABNORMAL HIGH (ref 0.1–0.9)
Monocytes: 6 %
Neutrophils Absolute: 10 10*3/uL — ABNORMAL HIGH (ref 1.4–7.0)
Neutrophils: 60 %
Platelets: 408 10*3/uL (ref 150–450)
RBC: 5.63 x10E6/uL — ABNORMAL HIGH (ref 3.77–5.28)
RDW: 13 % (ref 11.7–15.4)
WBC: 16.8 10*3/uL — ABNORMAL HIGH (ref 3.4–10.8)

## 2023-02-01 LAB — LIPID PANEL
Chol/HDL Ratio: 4.4 {ratio} (ref 0.0–4.4)
Cholesterol, Total: 174 mg/dL (ref 100–199)
HDL: 40 mg/dL (ref >39)
LDL Chol Calc (NIH): 100 mg/dL — ABNORMAL HIGH (ref 0–99)
Triglycerides: 200 mg/dL — ABNORMAL HIGH (ref 0–149)
VLDL Cholesterol Cal: 34 mg/dL (ref 5–40)

## 2023-02-01 NOTE — Addendum Note (Signed)
Addended by: Rolena Infante on: 03/23/2022 10:55 AM   Modules accepted: Orders

## 2023-02-14 ENCOUNTER — Telehealth: Payer: Self-pay | Admitting: Family Medicine

## 2023-02-14 ENCOUNTER — Other Ambulatory Visit (HOSPITAL_COMMUNITY): Payer: Self-pay

## 2023-02-14 DIAGNOSIS — E1165 Type 2 diabetes mellitus with hyperglycemia: Secondary | ICD-10-CM

## 2023-02-14 MED ORDER — TRESIBA FLEXTOUCH 200 UNIT/ML ~~LOC~~ SOPN: 90.0000 [IU] | PEN_INJECTOR | Freq: Two times a day (BID) | SUBCUTANEOUS | 1 refills | Status: DC

## 2023-02-14 MED ORDER — OZEMPIC (0.25 OR 0.5 MG/DOSE) 2 MG/3ML ~~LOC~~ SOPN: 0.5000 mg | PEN_INJECTOR | SUBCUTANEOUS | 0 refills | Status: DC

## 2023-02-14 NOTE — Addendum Note (Signed)
Addended by: Cleda Daub on: 02/14/2023 02:08 PM   Modules accepted: Orders

## 2023-02-14 NOTE — Addendum Note (Signed)
Addended by: Cleda Daub on: 02/14/2023 03:42 PM   Modules accepted: Orders

## 2023-02-14 NOTE — Telephone Encounter (Signed)
Spoke with [facility].   Ozempic processed for shipping 01/25/23, and shipped out today 02/14/23 (4 boxes) - tracking number [id number]. Will provide to patient via mychart.   Evaristo Bury also processed for shipping 01/25/23 but has not gone out yet. Per rep, [facility] is currently having product delays with this medication. Rep said medication should arrive by this Friday hopefully, and if not, they can be called back to provide a voucher for patient to fill at her pharmacy.

## 2023-02-14 NOTE — Telephone Encounter (Signed)
Copied from CRM [id number]. Topic: Clinical - Prescription Issue >> Feb 14, 2023 12:45 PM [name] wrote: Reason for CRM: pt states she needs Ozempic and 90 units of Tresiba TODAY. Please reach out

## 2023-02-14 NOTE — Telephone Encounter (Signed)
Copied from CRM [id number]. Topic: Clinical - Medication Refill >> Feb 14, 2023 12:44 PM [name] wrote: Most Recent Primary Care Visit:  Provider: Bennie Pierini  Department: [facility]  Visit Type: OFFICE VISIT  Date: 01/31/2023  Medication: insulin degludec (TRESIBA FLEXTOUCH) 200 UNIT/ML FlexTouch Pen and OZEMPIC   Has the patient contacted their pharmacy? Yes (Agent: If no, request that the patient contact the pharmacy for the refill. If patient does not wish to contact the pharmacy document the reason why and proceed with request.) (Agent: If yes, when and what did the pharmacy advise?)  Is this the correct pharmacy for this prescription? Yes If no, delete pharmacy and type the correct one.  This is the patient's preferred pharmacy:  [phone] [place] [place] - [address] [place] [address] Phone: [phone] Fax: [phone]  [facility] [place], [place] - [address] [place] [address] Phone: [phone] Fax: [phone]   Has the prescription been filled recently? Yes  Is the patient out of the medication? Yes  Has the patient been seen for an appointment in the last year OR does the patient have an upcoming appointment? Yes  Can we respond through MyChart? Yes  Agent: Please be advised that Rx refills may take up to 3 business days. We ask that you follow-up with your pharmacy.

## 2023-02-14 NOTE — Telephone Encounter (Signed)
Shipments normally take 10-14 business days to arrive at the office. The ozempic should be arriving any day now. Calling now to follow up on the Tresiba. I dont believe she will be getting another shipment for this year.

## 2023-02-14 NOTE — Telephone Encounter (Signed)
Attempted to contact patient and left message.  Per patients message she needs a refill on ozempic and tresiba. [facility] It looks like she gets her tresiba through patient assistance. Sent message to patient assistance to see if this has been ordered by them or what the next step should be.

## 2023-02-14 NOTE — Telephone Encounter (Signed)
Does patient still receive tresiba through patient assistance?

## 2023-02-14 NOTE — Telephone Encounter (Signed)
Called and spoke with patient. [facility] and sent tresiba rx to pharmacy per patients request

## 2023-02-15 ENCOUNTER — Telehealth: Payer: Self-pay

## 2023-02-15 ENCOUNTER — Ambulatory Visit: Payer: Self-pay | Admitting: Nurse Practitioner

## 2023-02-15 NOTE — Telephone Encounter (Signed)
Patient is asking if you will call her.  She feels she may be taking her Evaristo Bury incorrectly or something may be wrong because she does not think she should be running out of medication like she is.  She is aware you are off today.

## 2023-02-15 NOTE — Telephone Encounter (Signed)
Chief Complaint: out of insulin Symptoms: high blood sugar Frequency: 2 days Disposition: [] ED /[] Urgent Care (no appt availability in office) / [] Appointment(In office/virtual)/ []  [facility]/ [] Home Care/ [] Refused Recommended Disposition /[] [facility]/ [x]  Follow-up with PCP Additional Notes: Pt called stating she has been speaking with primary care office r/t refill of triseba. She states that a prescription was called in, however she is unable to fill even if paying out of pocket because she is on a payment assistance program. She states the pharmacist told her that she would need a prescription to be called in for an alternate medication. She states she has been out of [place] for 2 days. States she is taking semaglutide as well as novalin on her "own sliding scale". Per protocol, pt was warm transferred to [facility] and connected with Jan, RN.    Copied from CRM [id number]. Topic: Clinical - Medication Refill >> Feb 15, 2023  4:33 PM Tiffany H wrote: Most Recent Primary Care Visit:  Provider: Bennie Pierini  Department: [facility]  Visit Type: OFFICE VISIT  Date: 01/31/2023  Medication: nsulin degludec (TRESIBA FLEXTOUCH) 200 UNIT/ML FlexTouch Pen - another version?  Has the patient contacted their pharmacy? Yes (Agent: If no, request that the patient contact the pharmacy for the refill. If patient does not wish to contact the pharmacy document the reason why and proceed with request.) (Agent: If yes, when and what did the pharmacy advise?)  Is this the correct pharmacy for this prescription? Yes If no, delete pharmacy and type the correct one.  This is the patient's preferred pharmacy:  [phone] [place] [place] - [address] [place] [address] Phone: [phone] Fax: [phone]  [facility] Dept. [place], [place] - [address] [place] [address] Phone: [phone] Fax:  [phone]   Has the prescription been filled recently? Yes  Is the patient out of the medication? Yes  Has the patient been seen for an appointment in the last year OR does the patient have an upcoming appointment? Yes  Can we respond through MyChart? No  Agent: Please be advised that Rx refills may take up to 3 business days. We ask that you follow-up with your pharmacy. Reason for Disposition  [1] Prescription refill request for ESSENTIAL medicine (i.e., likelihood of harm to patient if not taken) AND [2] triager unable to refill per department policy  Answer Assessment - Initial Assessment Questions 1. DRUG NAME: "What medicine do you need to have refilled?"     Triseba 2. REFILLS REMAINING: "How many refills are remaining?" (Note: The label on the medicine or pill bottle will show how many refills are remaining. If there are no refills remaining, then a renewal may be needed.)     Pt states that she is out of this insulin and due to being on a payment assist program, the pharmacy will not allow her to fill, even paying out of pocket. 3. EXPIRATION DATE: "What is the expiration date?" (Note: The label states when the prescription will expire, and thus can no longer be refilled.)     NA 4. PRESCRIBING HCP: "Who prescribed it?" Reason: If prescribed by specialist, call should be referred to that group.     Mary-Margaret Martin 5. SYMPTOMS: "Do you have any symptoms?"     Blood sugar reading of 563 at 0400 today and 302 at last check.  Protocols used: Medication Refill and Renewal Call-A-AH

## 2023-02-15 NOTE — Telephone Encounter (Signed)
Patient informed we have received four boxes of Ozempic and they have been placed in the refrigerator for her to pick  up.

## 2023-02-16 ENCOUNTER — Telehealth: Payer: Self-pay | Admitting: Pharmacist

## 2023-02-16 ENCOUNTER — Other Ambulatory Visit: Payer: Self-pay | Admitting: Nurse Practitioner

## 2023-02-16 ENCOUNTER — Encounter: Payer: Self-pay | Admitting: Pharmacist

## 2023-02-16 DIAGNOSIS — E1165 Type 2 diabetes mellitus with hyperglycemia: Secondary | ICD-10-CM

## 2023-02-16 MED ORDER — TRESIBA FLEXTOUCH 200 UNIT/ML ~~LOC~~ SOPN: 90.0000 [IU] | PEN_INJECTOR | Freq: Two times a day (BID) | SUBCUTANEOUS | 1 refills | Status: DC

## 2023-02-16 NOTE — Telephone Encounter (Signed)
Spoke with patient at approximately 4:50 pm Tuesday afternoon.  Patient is out of medication and cannot obtain the prescription that was sent to her local pharmacy.  She asked for samples but we do not have any.  I spoke with patient's PCP, Bennie Pierini, about an alternative medication but PCP wants this to wait for pharmacist, Vanice Sarah, to address when she returns on Wednesday.

## 2023-02-16 NOTE — Telephone Encounter (Signed)
  Name from pharmacy: TRESIBA FLEXTOUCH 200 UNIT/ML    Pharmacy comment: Alternative Requested:PATIENTS ASSISTANCE PROGRAM CANT PROVIDE THIS MED FOR SOME REASON AT THIS TIME; INSURANCE WONT COVER; PATIENT IS ASKING FOR AN ALTERNATIVE DRUG THAT MAY BE COVERED.

## 2023-02-16 NOTE — Telephone Encounter (Signed)
Alexandra Hunt refills sent [facility] to [facility] Communicated with patient via mychart re: dosing, etc Attempting to get insulin for patient up front Will continue to follow

## 2023-02-18 ENCOUNTER — Encounter: Payer: Self-pay | Admitting: *Deleted

## 2023-02-18 ENCOUNTER — Ambulatory Visit (INDEPENDENT_AMBULATORY_CARE_PROVIDER_SITE_OTHER): Payer: Medicare HMO

## 2023-02-18 VITALS — Ht 64.0 in | Wt 202.0 lb

## 2023-02-18 DIAGNOSIS — Z0000 Encounter for general adult medical examination without abnormal findings: Secondary | ICD-10-CM

## 2023-02-18 MED ORDER — TRESIBA FLEXTOUCH 200 UNIT/ML ~~LOC~~ SOPN: 90.0000 [IU] | PEN_INJECTOR | Freq: Two times a day (BID) | SUBCUTANEOUS | 1 refills | Status: DC

## 2023-02-18 NOTE — Telephone Encounter (Signed)
Refill failed. resent °

## 2023-02-18 NOTE — Progress Notes (Signed)
Subjective:   Alexandra Hunt is a 77 y.o. female who presents for Medicare Annual (Subsequent) preventive examination.  Visit Complete: Virtual I connected with  Rudy Jew on 02/18/23 by a audio enabled telemedicine application and verified that I am speaking with the correct person using two identifiers.  Patient Location: Home  Provider Location: Home Office  I discussed the limitations of evaluation and management by telemedicine. The patient expressed understanding and agreed to proceed.  Vital Signs: Because this visit was a virtual/telehealth visit, some criteria may be missing or patient reported. Any vitals not documented were not able to be obtained and vitals that have been documented are patient reported.  Cardiac Risk Factors include: advanced age (>43men, >70 women);diabetes mellitus;dyslipidemia;hypertension     Objective:    Today's Vitals   02/18/23 1519  Weight: 202 lb (91.6 kg)  Height: 5\' 4"  (1.626 m)   Body mass index is 34.67 kg/m.     02/18/2023    3:25 PM 03/24/2022    3:58 PM 09/23/2021    6:27 PM 05/18/2021   10:27 PM 05/15/2021    2:50 PM 03/06/2021   10:21 AM 03/05/2020   10:52 AM  Advanced Directives  Does Patient Have a Medical Advance Directive? No Yes Yes No No No No  Type of Special educational needs teacher of [place];Living will Healthcare Power of [place];Living will      Does patient want to make changes to medical advance directive?   No - Patient declined      Copy of Healthcare Power of Attorney in Chart?  No - copy requested No - copy requested      Would patient like information on creating a medical advance directive? Yes (MAU/Ambulatory/Procedural Areas - Information given)    No - Patient declined No - Patient declined No - Patient declined    Current Medications (verified) Outpatient Encounter Medications as of 02/18/2023  Medication Sig   albuterol (VENTOLIN HFA) 108 (90 Base) MCG/ACT inhaler Inhale 2 puffs into the lungs  every 6 (six) hours as needed for wheezing or shortness of breath.   Alcohol Swabs (B-D SINGLE USE SWABS REGULAR) PADS TEST BS UP TO FOUR TIMES DAILY Dx E11.9   ALPHAGAN P 0.1 % SOLN APPLY 1 DROP TO EYE 2 TIMES DAILY.   aspirin 81 MG tablet Take 81 mg by mouth daily.   Blood Glucose Calibration (TRUE METRIX LEVEL 1) Low SOLN Use with glucose machine Dx E11.9,   Blood Glucose Monitoring Suppl (TRUE METRIX METER) w/Device KIT TEST BS UP TO FOUR TIMES DAILY Dx E11.9   Continuous Blood Gluc Receiver (FREESTYLE LIBRE 2 READER) DEVI Use to test blood sugar 4-6 times daily as directed DX: E11.9   [facility] LUBRICANT EYE DROPS 0.6 % SOLN Apply 1 drop to eye 2 (two) times daily.   dapagliflozin propanediol (FARXIGA) 5 MG TABS tablet Take 1 tablet (5 mg total) by mouth daily.   diclofenac (VOLTAREN) 75 MG EC tablet TAKE 1 TABLET BY MOUTH TWICE A DAY AS NEEDED   dorzolamide (TRUSOPT) 2 % ophthalmic solution Place 2 drops into both eyes 2 (two) times daily.   fenofibrate 160 MG tablet Take 1 tablet (160 mg total) by mouth daily.   fluticasone (FLONASE) 50 MCG/ACT nasal spray SPRAY 2 SPRAYS INTO EACH NOSTRIL EVERY DAY   furosemide (LASIX) 20 MG tablet TAKE 1 TABLET BY MOUTH EVERY DAY   gabapentin (NEURONTIN) 100 MG capsule Take 1 capsule (100mg ) by mouth in the  morning, 1 capsule (100mg ) in the afternoon, & 2 capsules (200mg ) in the evening   glucose blood (TRUE METRIX BLOOD GLUCOSE TEST) test strip TEST BS UP TO FOUR TIMES DAILY Dx E11.9   ibuprofen (ADVIL) 800 MG tablet TAKE 1 TABLET BY MOUTH EVERY 8 HOURS AS NEEDED   insulin degludec (TRESIBA FLEXTOUCH) 200 UNIT/ML FlexTouch Pen Inject 90 Units into the skin 2 (two) times daily. Gets via [employer] patient assistance   Insulin Pen Needle (B-D UF III MINI PEN NEEDLES) 31G X 5 MM MISC USE WITH SLIDING SCALE HUMALOG 4 TIMES DAILY Dx E11.9   insulin regular (NOVOLIN R) 100 units/mL injection INJECT 22UNITS 4 TIMES A DAY PER SLIDING SCALE   Insulin Syringe-Needle  U-100 (BD INSULIN SYRINGE U/F) 31G X 5/16" 1 ML MISC UAD w/ Lantus and Humalog insulin sliding scale Dx E11.9   Lancet Devices (TRUEDRAW LANCING DEVICE) MISC TEST BS UP TO FOUR TIMES DAILY Dx E11.9   lisinopril (ZESTRIL) 20 MG tablet Take 1 tablet (20 mg total) by mouth daily.   LORazepam (ATIVAN) 1 MG tablet Take 1 tablet (1 mg total) by mouth 3 (three) times daily.   meclizine (ANTIVERT) 25 MG tablet Take 1 tablet (25 mg total) by mouth 3 (three) times daily as needed for dizziness.   mirabegron ER (MYRBETRIQ) 50 MG TB24 tablet Take 1 tablet (50 mg total) by mouth daily.   mupirocin cream (BACTROBAN) 2 % APPLY TO AFFECTED AREA TWICE A DAY   nystatin (MYCOSTATIN) 100000 UNIT/ML suspension Take 5 mLs (500,000 Units total) by mouth 4 (four) times daily.   ondansetron (ZOFRAN) 4 MG tablet    Semaglutide,0.25 or 0.5MG /DOS, (OZEMPIC, 0.25 OR 0.5 MG/DOSE,) 2 MG/3ML SOPN Inject 0.5 mg into the skin once a week.   sertraline (ZOLOFT) 100 MG tablet Take 2 tablets (200 mg total) by mouth daily.   TRUEplus Lancets 33G MISC TEST BS UP TO FOUR TIMES DAILY Dx E11.9   Vitamin D, Ergocalciferol, (DRISDOL) 1.25 MG (50000 UNIT) CAPS capsule TAKE 1 CAPSULE EVERY 7 DAYS   No facility-administered encounter medications on file as of 02/18/2023.    Allergies (verified) Penicillins, Ivp dye [iodinated contrast media], Statins, Codeine, Latex, and Sulfa antibiotics   History: Past Medical History:  Diagnosis Date   Benign breast lumps    Cancer (HCC) 2007   uterine   Cervical radiculopathy    Cervical radiculopathy    Change in bowel habits    soft stools   Depression    Diabetes mellitus without complication (HCC)    type 2 diabetic   History of anal fissures    Hx of adenomatous colonic polyps 03/20/2018   Hx of gallstones    Hyperlipidemia    Hypertension    Schizoid personality disorder (HCC)    pt not taking her meds  (pt states this is an inaccurate diagnosis and wants it removed) 03-15-2018    Skin cancer    on arms/right hand   Sleep apnea    Past Surgical History:  Procedure Laterality Date   ABDOMINAL HYSTERECTOMY     due to uterine cancer   BREAST SURGERY     left breast biopsy/benign   TUBAL LIGATION     Family History  Problem Relation Age of Onset   Diabetes Mother    Blindness Mother        related to diabetes   Stroke Mother    Heart disease Father        MI  Heart attack Father    Heart defect Father    Breast cancer Sister    Diabetes Brother        diet controlled   Colon cancer Neg Hx    Esophageal cancer Neg Hx    Rectal cancer Neg Hx    Stomach cancer Neg Hx    Social History   Socioeconomic History   Marital status: Widowed    Spouse name: Not on file   Number of children: 5   Years of education: 44   Highest education level: Associate degree: occupational, Scientist, product/process development, or vocational program  Occupational History   Occupation: retired  Tobacco Use   Smoking status: Never    Passive exposure: Never   Smokeless tobacco: Never  Vaping Use   Vaping status: Never Used  Substance and Sexual Activity   Alcohol use: No   Drug use: No   Sexual activity: Not Currently    Birth control/protection: Post-menopausal  Other Topics Concern   Not on file  Social History Narrative   Son, Daughter, and granddaughter live with her   Her daughter is going through drug addiction treatment   Lives in one level home with basement - she doesn't use stairs at all.   She has been very withdrawn and depressed since her husband passed.   Social Drivers of Corporate investment banker Strain: Low Risk  (02/18/2023)   Overall Financial Resource Strain (CARDIA)    Difficulty of Paying Living Expenses: Not hard at all  Food Insecurity: No Food Insecurity (02/18/2023)   Hunger Vital Sign    Worried About Running Out of Food in the Last Year: Never true    Ran Out of Food in the Last Year: Never true  Transportation Needs: No Transportation Needs (02/18/2023)    PRAPARE - Administrator, Civil Service (Medical): No    Lack of Transportation (Non-Medical): No  Physical Activity: Inactive (02/18/2023)   Exercise Vital Sign    Days of Exercise per Week: 0 days    Minutes of Exercise per Session: 0 min  Stress: No Stress Concern Present (02/18/2023)   [employer] of Occupational Health - Occupational Stress Questionnaire    Feeling of Stress : Not at all  Social Connections: Moderately Isolated (02/18/2023)   Social Connection and Isolation Panel [NHANES]    Frequency of Communication with Friends and Family: More than three times a week    Frequency of Social Gatherings with Friends and Family: Three times a week    Attends Religious Services: 1 to 4 times per year    Active Member of Clubs or Organizations: No    Attends Banker Meetings: Never    Marital Status: Widowed    Tobacco Counseling Counseling given: Not Answered   Clinical Intake:  Pre-visit preparation completed: Yes  Pain : No/denies pain     Diabetes: Yes CBG done?: No Did pt. bring in CBG monitor from home?: No  How often do you need to have someone help you when you read instructions, pamphlets, or other written materials from your doctor or pharmacy?: 1 - Never  Interpreter Needed?: No  Information entered by :: Kandis Fantasia LPN   Activities of Daily Living    02/18/2023    3:25 PM 03/24/2022    3:58 PM  In your present state of health, do you have any difficulty performing the following activities:  Hearing? 0 0  Vision? 0 0  Difficulty concentrating or making decisions?  0 0  Walking or climbing stairs? 0 0  Dressing or bathing? 0 0  Doing errands, shopping? 0 0  Preparing Food and eating ? N N  Using the Toilet? N N  In the past six months, have you accidently leaked urine? N N  Do you have problems with loss of bowel control? N N  Managing your Medications? N N  Managing your Finances? N N  Housekeeping or  managing your Housekeeping? N N    Patient Care Team: Bennie Pierini, FNP as PCP - General (Family Medicine) Freddy Finner, MD (Inactive) as Consulting Physician (Obstetrics and Gynecology) Karie Soda, MD as Consulting Physician (General Surgery) Iva Boop, MD as Consulting Physician (Gastroenterology) Danella Maiers, [facility] (Pharmacist) Ollen Gross, MD as Consulting Physician (Orthopedic Surgery) Mateo Flow, MD as Consulting Physician (Ophthalmology) Randa Spike Kelton Pillar, LCSW as [facility] Care Management (Licensed Clinical Social Worker)  Indicate any recent Medical Services you may have received from other than [facility] providers in the past year (date may be approximate).     Assessment:   This is a routine wellness examination for Enas.  Hearing/Vision screen Hearing Screening - Comments:: Denies hearing difficulties   Vision Screening - Comments:: Wears rx glasses - up to date with routine eye exams with [facility]     Goals Addressed   None   Depression Screen    02/18/2023    3:23 PM 01/31/2023    3:14 PM 11/11/2022    3:28 PM 04/09/2022    3:58 PM 03/24/2022    3:55 PM 12/28/2021    3:48 PM 09/23/2021    6:21 PM  PHQ 2/9 Scores  PHQ - 2 Score 3 3 0 5 0 1 1  PHQ- 9 Score 9 9 0 13  7     Fall Risk    02/18/2023    3:25 PM 01/31/2023    3:14 PM 11/11/2022    3:28 PM 04/09/2022    3:58 PM 03/24/2022    3:46 PM  Fall Risk   Falls in the past year? 1 1 0 1 1  Number falls in past yr: 1 0  1 1  Injury with Fall? 0 0  1 1  Risk for fall due to : History of fall(s);Impaired balance/gait;Impaired mobility History of fall(s)  History of fall(s) History of fall(s);Impaired balance/gait;Orthopedic patient  Follow up Education provided;Falls prevention discussed;Falls evaluation completed Education provided  Education provided Education provided;Falls prevention discussed    MEDICARE RISK AT HOME: Medicare Risk at Home Any stairs  in or around the home?: No If so, are there any without handrails?: No Home free of loose throw rugs in walkways, pet beds, electrical cords, etc?: Yes Adequate lighting in your home to reduce risk of falls?: Yes Life alert?: No Use of a cane, walker or w/c?: Yes Grab bars in the bathroom?: Yes Shower chair or bench in shower?: No Elevated toilet seat or a handicapped toilet?: Yes  TIMED UP AND GO:  Was the test performed?  No    Cognitive Function:    03/14/2015    4:03 PM  MMSE - Mini Mental State Exam  Orientation to time 5  Orientation to Place 5  Registration 3  Attention/ Calculation 5  Recall 3  Language- name 2 objects 2  Language- repeat 1  Language- follow 3 step command 3  Language- read & follow direction 1  Write a sentence 1  Copy design 1  Total score  30        02/18/2023    3:25 PM 03/24/2022    3:58 PM 03/05/2020   10:59 AM 07/03/2018    2:59 PM  6CIT Screen  What Year? 0 points 0 points 0 points 0 points  What month? 0 points 0 points 0 points 0 points  What time? 0 points 0 points 0 points 0 points  Count back from 20 0 points 0 points 0 points 0 points  Months in reverse 0 points 0 points 2 points 2 points  Repeat phrase 0 points 0 points 0 points 0 points  Total Score 0 points 0 points 2 points 2 points    Immunizations Immunization History  Administered Date(s) Administered   Fluad Quad(high Dose 65+) 03/04/2020, 11/28/2020, 12/28/2021   Fluad Trivalent(High Dose 65+) 11/11/2022   Influenza, High Dose Seasonal PF 12/27/2016, 01/27/2018   Influenza,inj,Quad PF,6+ Mos 02/07/2013, 12/20/2014, 12/02/2015   Influenza,inj,quad, With Preservative 12/30/2016   Influenza-Unspecified 05/30/2014   Pneumococcal Conjugate-13 12/20/2014   Pneumococcal Polysaccharide-23 09/29/2012   Td (Adult),5 Lf Tetanus Toxid, Preservative Free 03/03/2012   Tdap 03/03/2012, 01/31/2023    TDAP status: Up to date  Flu Vaccine status: Up to date  Pneumococcal  vaccine status: Up to date  Covid-19 vaccine status: Information provided on how to obtain vaccines.   Qualifies for Shingles Vaccine? Yes   Zostavax completed No   Shingrix Completed?: No.    Education has been provided regarding the importance of this vaccine. Patient has been advised to call insurance company to determine out of pocket expense if they have not yet received this vaccine. Advised may also receive vaccine at local pharmacy or Health Dept. Verbalized acceptance and understanding.  Screening Tests Health Maintenance  Topic Date Due   COVID-19 Vaccine (1) Never done   Zoster Vaccines- Shingrix (1 of 2) Never done   Diabetic kidney evaluation - Urine ACR  12/29/2022   Colonoscopy  03/15/2023   MAMMOGRAM  11/11/2023 (Originally 05/08/2022)   OPHTHALMOLOGY EXAM  03/30/2023   LIPID PANEL  05/01/2023   HEMOGLOBIN A1C  05/01/2023   Diabetic kidney evaluation - eGFR measurement  01/31/2024   FOOT EXAM  01/31/2024   Medicare Annual Wellness (AWV)  02/18/2024   DEXA SCAN  07/20/2024   DTaP/Tdap/Td (4 - Td or Tdap) 01/30/2033   Pneumonia Vaccine 30+ Years old  Completed   INFLUENZA VACCINE  Completed   Hepatitis C Screening  Completed   HPV VACCINES  Aged Out    Health Maintenance  Health Maintenance Due  Topic Date Due   COVID-19 Vaccine (1) Never done   Zoster Vaccines- Shingrix (1 of 2) Never done   Diabetic kidney evaluation - Urine ACR  12/29/2022   Colonoscopy  03/15/2023    Colorectal cancer screening: Type of screening: Colonoscopy. Completed 03/14/18. Repeat every 5 years  Mammogram status: Completed 05/07/21. Repeat every year  Bone Density status: Completed 07/21/22. Results reflect: Bone density results: OSTEOPENIA. Repeat every 2 years.  Lung Cancer Screening: (Low Dose CT Chest recommended if Age 47-80 years, 20 pack-year currently smoking OR have quit w/in 15years.) does not qualify.   Lung Cancer Screening Referral: n/a  Additional  Screening:  Hepatitis C Screening: does qualify; Completed 02/20/15  Vision Screening: Recommended annual ophthalmology exams for early detection of glaucoma and other disorders of the eye. Is the patient up to date with their annual eye exam?  Yes  Who is the provider or what is the name of the office  in which the patient attends annual eye exams? [facility]  If pt is not established with a provider, would they like to be referred to a provider to establish care? No .   Dental Screening: Recommended annual dental exams for proper oral hygiene  Diabetic Foot Exam: Diabetic Foot Exam: Completed 01/31/23  Community Resource Referral / Chronic Care Management: CRR required this visit?  No   CCM required this visit?  No     Plan:     I have personally reviewed and noted the following in the patient's chart:   Medical and social history Use of alcohol, tobacco or illicit drugs  Current medications and supplements including opioid prescriptions. Patient is not currently taking opioid prescriptions. Functional ability and status Nutritional status Physical activity Advanced directives List of other physicians Hospitalizations, surgeries, and ER visits in previous 12 months Vitals Screenings to include cognitive, depression, and falls Referrals and appointments  In addition, I have reviewed and discussed with patient certain preventive protocols, quality metrics, and best practice recommendations. A written personalized care plan for preventive services as well as general preventive health recommendations were provided to patient.     Kandis Fantasia [place], [place]   [id number]/11/[id number]   After Visit Summary: (MyChart) Due to this being a telephonic visit, the after visit summary with patients personalized plan was offered to patient via MyChart   Nurse Notes: No concerns at this time

## 2023-02-18 NOTE — Telephone Encounter (Signed)
Please make sure patient got her samples from fridge

## 2023-02-18 NOTE — Patient Instructions (Signed)
Ms. Haymon , Thank you for taking time to come for your Medicare Wellness Visit. I appreciate your ongoing commitment to your health goals. Please review the following plan we discussed and let me know if I can assist you in the future.   Referrals/Orders/Follow-Ups/Clinician Recommendations: Aim for 30 minutes of exercise or brisk walking, 6-8 glasses of water, and 5 servings of fruits and vegetables each day.  This is a list of the screening recommended for you and due dates:  Health Maintenance  Topic Date Due   COVID-19 Vaccine (1) Never done   Zoster (Shingles) Vaccine (1 of 2) Never done   Yearly kidney health urinalysis for diabetes  12/29/2022   Colon Cancer Screening  03/15/2023   Mammogram  11/11/2023*   Eye exam for diabetics  03/30/2023   Lipid (cholesterol) test  05/01/2023   Hemoglobin A1C  05/01/2023   Yearly kidney function blood test for diabetes  01/31/2024   Complete foot exam   01/31/2024   Medicare Annual Wellness Visit  02/18/2024   DEXA scan (bone density measurement)  07/20/2024   DTaP/Tdap/Td vaccine (4 - Td or Tdap) 01/30/2033   Pneumonia Vaccine  Completed   Flu Shot  Completed   Hepatitis C Screening  Completed   HPV Vaccine  Aged Out  *Topic was postponed. The date shown is not the original due date.    Advanced directives: (ACP Link)Information on Advanced Care Planning can be found at [facility] Advance Health Care Directives Advance Health Care Directives ([email or web address]   Next Medicare Annual Wellness Visit scheduled for next year: Yes

## 2023-02-26 ENCOUNTER — Other Ambulatory Visit: Payer: Self-pay | Admitting: Nurse Practitioner

## 2023-03-01 ENCOUNTER — Other Ambulatory Visit: Payer: Self-pay | Admitting: Nurse Practitioner

## 2023-03-01 ENCOUNTER — Other Ambulatory Visit (HOSPITAL_COMMUNITY): Payer: Self-pay

## 2023-03-01 ENCOUNTER — Telehealth: Payer: Self-pay | Admitting: Pharmacist

## 2023-03-01 DIAGNOSIS — E1165 Type 2 diabetes mellitus with hyperglycemia: Secondary | ICD-10-CM

## 2023-03-01 MED ORDER — TRESIBA FLEXTOUCH 200 UNIT/ML ~~LOC~~ SOPN: 90.0000 [IU] | PEN_INJECTOR | Freq: Two times a day (BID) | SUBCUTANEOUS | 5 refills | Status: DC

## 2023-03-01 NOTE — Telephone Encounter (Signed)
 Alexandra Hunt [id number] called in to local pharmacy  Patient awaiting PAP supply CPhT to assist with [employer]

## 2023-03-01 NOTE — Telephone Encounter (Signed)
Voucher from [employer] [facility]:     For 30 day supply of medication while shipment processes. Called into [facility], fill successful.

## 2023-03-02 NOTE — Progress Notes (Signed)
 HPI F never smoker, retired engineer, civil (consulting) followed for OSA, complicated by HTN, Chronic Rhinitis, Asthma, DM2, Recurrent Otitis Media, Eustachian Dysfunction, Endometrial Cancer, Hyperlipidemia, Anxiety, Glaucoma, Psoriasis, HST 12/19/22- AHI 13/hr, desat to 84%, body weight 198 lbs  ============================================================================  11/29/22- [name] never smoker, retired engineer, civil (consulting),  with remote hx OSA/ CPAP broke, never replaced. Now self referred. Complicated by HTN, Chronic Rhinitis, DM2,  Recurrent Otitis Media, Eustachian Dysfunction, Endometrial Cancer, Hyperlipidemia, Anxiety, Glaucoma,  -Albuterol  hfa,  Epworth score-1 Body weight today-200 lbs OSA- She is sure she needs CPAP- tired and snore. Wakes gasping.  Discussed process of getting Home Sleep Test. Dyspnea on exertion- Did have albuterol - some help. Occasional cough or wheeze. Not acutely ill. Has no inhaler now. We can replace, then assess needs.  03/03/23-  56 F never smoker, retired engineer, civil (consulting) followed for [employer],  complicated by HTN, Chronic Rhinitis, DM2, Recurrent Otitis Media, Eustachian Dysfunction, Endometrial Cancer, Hyperlipidemia, Anxiety, Glaucoma, Psoriasis, -Albuterol  hfa,  HST 12/19/22- AHI 13/hr, desat to 84%, body weight 198 lbs Body weight today-203 lbs For treatment decision Discussed the use of AI scribe software for clinical note transcription with the patient, who gave verbal consent to proceed.  History of Present Illness   The patient, with a history of sleep apnea, presents for a follow-up after a recent home sleep study. She reports daytime tiredness and snoring, and the sleep study showed that she stops breathing about thirteen times an hour. She has previously used CPAP therapy, but has been off it for a long time. She expresses interest in resuming CPAP therapy, but also inquires about the possibility of a fitted mouthpiece as an alternative. However, she is currently undergoing dental work,  including the surgical removal of a broken tooth and the fitting of a bridge, which may not be compatible with the mouthpiece. She also mentions a heart murmur, which was not detected during the current examination.     ROS-see HPI   + = positive Constitutional:    weight loss, night sweats, fevers, chills, fatigue, lassitude. HEENT:    headaches, difficulty swallowing, tooth/dental problems, sore throat,       sneezing, itching, ear ache, nasal congestion, post nasal drip, snoring CV:    chest pain, orthopnea, PND, swelling in lower extremities, anasarca,                                   dizziness, palpitations Resp:   shortness of breath with exertion or at rest.                productive cough,   non-productive cough, coughing up of blood.              change in color of mucus.  wheezing.   Skin:    rash or lesions. GI:  No-   heartburn, indigestion, abdominal pain, nausea, vomiting, diarrhea,                 change in bowel habits, loss of appetite GU: dysuria, change in color of urine, no urgency or frequency.   flank pain. MS:   joint pain, stiffness, decreased range of motion, back pain. Neuro-     nothing unusual Psych:  change in mood or affect.  depression or anxiety.   memory loss.  OBJ- Physical Exam General- Alert, Oriented, Affect-appropriate, Distress- none acute, +obese Skin- rash-none, lesions- none, excoriation- none Lymphadenopathy- none Head- atraumatic  Eyes- Gross vision intact, PERRLA, conjunctivae and secretions clear            Ears- Hearing, canals-normal            Nose- Clear, no-Septal dev, mucus, polyps, erosion, perforation             Throat- Mallampati III , mucosa clear , drainage- none, tonsils- atrophic, +teeth, Neck- flexible , trachea midline, no stridor , thyroid  nl, carotid no bruit Chest - symmetrical excursion, unlabored           Heart/CV- RRR ,  murmur+1/6 S , no gallop  , no rub, nl s1 s2                           - JVD- none ,  edema- none, stasis changes- none, varices- none           Lung- clear to P&A, wheeze- none, cough- none , dullness-none, rub- none           Chest wall-  Abd-  Br/ Gen/ Rectal- Not done, not indicated Extrem-+cane Neuro- grossly intact to observation  Assessment and Plan    Obstructive Sleep Apnea Home sleep test shows mild range with 13 events per hour. Daytime tiredness and snoring reported. Discussed options of CPAP and dental appliance. Dental work pending and patient prefers CPAP. -Order auto-adjusting CPAP with pressure range 5-15. -Allow patient to choose mask type with home care company. -If issues with mask fit or other CPAP concerns, patient to contact office.  Trace Heart Murmur No change noted on current examination. -No specific intervention required at this time.  General Health Maintenance / Followup Plans -Follow up as needed for any issues with CPAP use or other concerns.

## 2023-03-03 ENCOUNTER — Encounter: Payer: Self-pay | Admitting: Internal Medicine

## 2023-03-03 ENCOUNTER — Ambulatory Visit: Payer: Medicare HMO | Admitting: Internal Medicine

## 2023-03-03 VITALS — BP 140/60 | HR 96 | Ht 64.0 in | Wt 203.0 lb

## 2023-03-03 DIAGNOSIS — G4733 Obstructive sleep apnea (adult) (pediatric): Secondary | ICD-10-CM

## 2023-03-03 MED ORDER — ALBUTEROL SULFATE HFA 108 (90 BASE) MCG/ACT IN AERS: 2.0000 | INHALATION_SPRAY | Freq: Four times a day (QID) | RESPIRATORY_TRACT | 12 refills | Status: AC | PRN

## 2023-03-03 NOTE — Patient Instructions (Signed)
 Order- new DME, new CPAP auto 5-15, mask of choice, humidifier, supplies, AirView/ card

## 2023-03-03 NOTE — Telephone Encounter (Signed)
 Please check on tresiba for patient. She is suppose to be getting from company

## 2023-03-11 DIAGNOSIS — E1165 Type 2 diabetes mellitus with hyperglycemia: Secondary | ICD-10-CM | POA: Diagnosis not present

## 2023-03-13 ENCOUNTER — Other Ambulatory Visit: Payer: Self-pay | Admitting: Nurse Practitioner

## 2023-03-13 ENCOUNTER — Other Ambulatory Visit: Payer: Self-pay | Admitting: Urology

## 2023-03-13 DIAGNOSIS — R32 Unspecified urinary incontinence: Secondary | ICD-10-CM

## 2023-03-15 DIAGNOSIS — M170 Bilateral primary osteoarthritis of knee: Secondary | ICD-10-CM | POA: Diagnosis not present

## 2023-03-16 ENCOUNTER — Telehealth: Payer: Self-pay

## 2023-03-16 NOTE — Telephone Encounter (Signed)
 Copied from CRM [id number]. Topic: Clinical - Prescription Issue >> Mar 16, 2023  2:45 PM Bridgette Campus T wrote: Reason for CRM: shipment was returned to shipper [employer] [facility] for insulin  degludec (TRESIBA  FLEXTOUCH) 200 UNIT/ML FlexTouch Pen and Novafine needles, they are offering 120 day supply voucher, please call Jocelyn with [employer] at [phone]

## 2023-03-17 NOTE — Telephone Encounter (Signed)
Please review

## 2023-03-18 ENCOUNTER — Other Ambulatory Visit (HOSPITAL_COMMUNITY): Payer: Self-pay

## 2023-03-18 ENCOUNTER — Telehealth: Payer: Self-pay

## 2023-03-18 NOTE — Telephone Encounter (Signed)
PCP e-signed novo nordisk PAP Successfully submitted

## 2023-03-18 NOTE — Progress Notes (Deleted)
Pharmacy Medication Assistance Program Note    03/18/2023  Patient ID: SHAKISHA HERRAN, female   DOB: 1945/04/14, 78 y.o.   MRN: [id number]     01/25/2023 03/18/2023  Outreach Medication One  Manufacturer Medication One [employer] [facility] Ozempic Ozempic  Dose of Ozempic  0.5MG   Type of Radiographer, therapeutic Assistance   Date Application Sent to Patient 01/25/2023   Date Application Submitted to Manufacturer  03/18/2023  Method Application Sent to Manufacturer  Online        03/18/2023  Patient ID: Rudy Jew, female  DOB: 1945/05/27, 78 y.o.  MRN:  [id number]     01/25/2023 03/18/2023  Outreach Medication Two  Manufacturer Medication Two [employer]   [facility] Novolin   Dose of Novolin R   Type of Radiographer, therapeutic Assistance   Date Application Sent to Patient 01/25/2023   Method Application Sent to Manufacturer  Online  Date Application Submitted to Manufacturer  03/18/2023        03/18/2023  Patient ID: Rudy Jew, female  DOB: 09/20/1945, 78 y.o.  MRN:  [id number]     01/25/2023 03/18/2023  Outreach Medication Three  Manufacturer Medication Three Novo Nordisk   [facility] Evaristo Bury   Dose of Evaristo Bury U200   Type of Radiographer, therapeutic Assistance   Date Application Sent to Patient 01/25/2023   Date Application Submitted to Manufacturer  03/18/2023  Method Application Sent to [employer]     Application never returned. Submitted online.

## 2023-03-18 NOTE — Telephone Encounter (Signed)
Not sure what is going on with novo nordisk delays at the moment.  The company provided her with a voucher 03/01/23 for her tresiba that I was able to call in for her. She paid $0 for a 30 day supply and picked it up 03/04/23.  Her enrollment ended 03/01/23. Renewal application was mailed to her 12/2022 but never returned. Re-enrollment via online portal is in progress now for Ozempic, Tresiba, and Novolin R.

## 2023-03-21 ENCOUNTER — Encounter: Payer: Self-pay | Admitting: Internal Medicine

## 2023-03-22 DIAGNOSIS — M170 Bilateral primary osteoarthritis of knee: Secondary | ICD-10-CM | POA: Diagnosis not present

## 2023-03-25 NOTE — Progress Notes (Signed)
Pharmacy Medication Assistance Program Note    03/25/2023  Patient ID: Alexandra Hunt, female   DOB: Sep 26, 1945, 78 y.o.   MRN: [id number]     01/25/2023 03/18/2023  Outreach Medication One  Manufacturer Medication One [facility] Ozempic Ozempic  Dose of Ozempic  0.5MG   Type of Radiographer, therapeutic Assistance   Date Application Sent to Patient 01/25/2023   Date Application Submitted to Manufacturer  03/18/2023  Method Application Sent to Manufacturer  Online  Patient Assistance Determination  Approved  Approval Start Date  03/22/2023  Approval End Date  02/29/2024        03/25/2023  Patient ID: Alexandra Hunt, female  DOB: 08-23-1945, 78 y.o.  MRN:  [id number]     01/25/2023 03/18/2023  Outreach Medication Two  Manufacturer Medication Two [employer] [facility] [employer] [facility] Novolin   Dose of Novolin R   Type of Radiographer, therapeutic Assistance   Date Application Sent to Patient 01/25/2023   Method Application Sent to Manufacturer  Online  Date Application Submitted to Manufacturer  03/18/2023  Patient Assistance Determination  Approved  Approval Start Date  03/22/2023        03/25/2023  Patient ID: Alexandra Hunt, female  DOB: 05/13/1945, 78 y.o.  MRN:  [id number]     01/25/2023 03/18/2023  Outreach Medication Three  Manufacturer Medication Three [employer] [facility] [employer] [facility] Evaristo Bury   Dose of Evaristo Bury U200   Type of Radiographer, therapeutic Assistance   Date Application Sent to Patient 01/25/2023   Date Application Submitted to Manufacturer  03/18/2023  Method Application Sent to Manufacturer  Online  Patient Assistance Determination  Approved  Approval Start Date  03/22/2023  Approval End Date  02/29/2024

## 2023-03-29 DIAGNOSIS — M1712 Unilateral primary osteoarthritis, left knee: Secondary | ICD-10-CM | POA: Diagnosis not present

## 2023-03-29 DIAGNOSIS — M1711 Unilateral primary osteoarthritis, right knee: Secondary | ICD-10-CM | POA: Diagnosis not present

## 2023-03-29 DIAGNOSIS — M170 Bilateral primary osteoarthritis of knee: Secondary | ICD-10-CM | POA: Diagnosis not present

## 2023-04-22 ENCOUNTER — Other Ambulatory Visit: Payer: Self-pay | Admitting: Nurse Practitioner

## 2023-04-22 DIAGNOSIS — N393 Stress incontinence (female) (male): Secondary | ICD-10-CM

## 2023-05-02 ENCOUNTER — Other Ambulatory Visit: Payer: Self-pay | Admitting: Nurse Practitioner

## 2023-05-02 DIAGNOSIS — E1165 Type 2 diabetes mellitus with hyperglycemia: Secondary | ICD-10-CM

## 2023-05-02 NOTE — Telephone Encounter (Signed)
 Insulin Degludec FlexTouch 200 UNIT/ML SOPN        Changed from: insulin degludec (TRESIBA FLEXTOUCH) 200 UNIT/ML FlexTouch Pen   Pharmacy comment: Alternative Requested:BRAND AND GENERIC TRESIBA NOT COVERED.

## 2023-05-03 ENCOUNTER — Ambulatory Visit: Payer: Medicare HMO | Admitting: Nurse Practitioner

## 2023-05-03 DIAGNOSIS — M170 Bilateral primary osteoarthritis of knee: Secondary | ICD-10-CM | POA: Diagnosis not present

## 2023-05-03 NOTE — Telephone Encounter (Signed)
 Gets through patient assistance

## 2023-05-05 ENCOUNTER — Other Ambulatory Visit: Payer: Self-pay | Admitting: Nurse Practitioner

## 2023-05-05 DIAGNOSIS — E1165 Type 2 diabetes mellitus with hyperglycemia: Secondary | ICD-10-CM

## 2023-05-05 NOTE — Telephone Encounter (Signed)
 Name from pharmacy: TRESIBA FLEXTOUCH 200 UNIT/ML      Pharmacy comment: Alternative Requested:BRAND AND GENERIC NOT COVERED BY INSURANCE.

## 2023-05-05 NOTE — Telephone Encounter (Signed)
 Alexandra Hunt was changed to Alexandra Hunt due to insurance coverage  Meds ordered this encounter  Medications   insulin glargine (Alexandra Hunt SOLOSTAR) 100 UNIT/ML Solostar Pen    Sig: Inject 90 Units into the skin 2 (two) times daily.    Dispense:  30 mL    Refill:  PRN   Mary-Margaret Daphine Deutscher, FNP

## 2023-05-06 ENCOUNTER — Ambulatory Visit: Payer: Medicare HMO | Admitting: Nurse Practitioner

## 2023-05-10 ENCOUNTER — Telehealth: Payer: Self-pay

## 2023-05-10 NOTE — Telephone Encounter (Signed)
 Left message informing that insulin has arrived and is ready for pick up. [facility] 05/10/23

## 2023-05-18 ENCOUNTER — Telehealth: Payer: Self-pay

## 2023-05-18 NOTE — Telephone Encounter (Signed)
 noted

## 2023-05-18 NOTE — Telephone Encounter (Signed)
 Copied from CRM [id number]. Topic: General - Call Back - No Documentation >> May 18, 2023  1:21 PM Shardie S wrote: Reason for CRM: Patient states that she has a missed call regarding medication pickup. Reviewed her chart notes and informed her of insulin pickup. However patient states she has already picked up her insulin.Contacted [facility], unreachable at this time.  Please contact patient if there is additional medication she needs to pickup.

## 2023-05-23 DIAGNOSIS — H40023 Open angle with borderline findings, high risk, bilateral: Secondary | ICD-10-CM | POA: Diagnosis not present

## 2023-05-23 LAB — HM DIABETES EYE EXAM

## 2023-05-26 ENCOUNTER — Telehealth: Payer: Self-pay | Admitting: Internal Medicine

## 2023-05-26 NOTE — Telephone Encounter (Signed)
 PT is out of compliance because she is waiting for another mask. States current mask makes her feel like she can not breath. I see no new mask was ordered. I wanted to note chart because of the compliance issue and PT may need guidance from the nurse.

## 2023-05-27 NOTE — Telephone Encounter (Addendum)
 Left message on VM for patient to call back.  Patients last OV 03/03/2023 with Dr. Maple Hudson.  Patient had an OV scheduled for 06/02/2023 (which would have been 91 days) but canceled this appt. Patient rescheduled OV with Dr. Maple Hudson on 08/29/2023. Need to try to get patient in next week if opening available with Dr. Maple Hudson.  Order was just placed as a new start with new CPAP on 03/04/2023.  Will place another order for fitting of another mask that is more comfortable.  Per Dr. Maple Hudson, he placed the order as "mask of choice", so patient should be able to call [facility] and get an appointment with them to go to their office and choose another CPAP mask that is more comfortable.  (We do not have to place another DME order).  Will wait for patient to return call.

## 2023-05-28 DIAGNOSIS — M25562 Pain in left knee: Secondary | ICD-10-CM | POA: Diagnosis not present

## 2023-05-30 NOTE — Telephone Encounter (Addendum)
 Called patient.  Patient states she has contacted [facility] and ordered another CPAP mask.  She should get this mask one day this week.  [facility] told patient she should be fine to get the mask, start wearing the CPAP and keep her OV for 08/29/2023.  Patient did not want to reschedule her appointment with Dr. Maple Hudson at this time.

## 2023-05-31 ENCOUNTER — Telehealth: Payer: Self-pay

## 2023-05-31 NOTE — Telephone Encounter (Signed)
 Called patient to inform that NovoFine disposable needles have arrived and are ready for pick up. Patient verberlized understanding. [facility] 05/31/23

## 2023-06-02 ENCOUNTER — Ambulatory Visit: Payer: Medicare HMO | Admitting: Internal Medicine

## 2023-06-09 DIAGNOSIS — E1165 Type 2 diabetes mellitus with hyperglycemia: Secondary | ICD-10-CM | POA: Diagnosis not present

## 2023-06-10 ENCOUNTER — Ambulatory Visit: Admitting: Nurse Practitioner

## 2023-06-10 ENCOUNTER — Other Ambulatory Visit: Payer: Self-pay | Admitting: *Deleted

## 2023-06-10 DIAGNOSIS — E1165 Type 2 diabetes mellitus with hyperglycemia: Secondary | ICD-10-CM

## 2023-06-10 MED ORDER — OZEMPIC (0.25 OR 0.5 MG/DOSE) 2 MG/3ML ~~LOC~~ SOPN: 0.5000 mg | PEN_INJECTOR | SUBCUTANEOUS | 0 refills | Status: DC

## 2023-06-14 ENCOUNTER — Telehealth: Admitting: Physician Assistant

## 2023-06-14 ENCOUNTER — Ambulatory Visit: Payer: Self-pay | Admitting: Nurse Practitioner

## 2023-06-14 DIAGNOSIS — N390 Urinary tract infection, site not specified: Secondary | ICD-10-CM

## 2023-06-14 NOTE — Telephone Encounter (Unsigned)
 Copied from CRM [id number]. Topic: Clinical - Prescription Issue >> Jun 14, 2023  4:28 PM Baldomero Bone wrote: Reason for CRM: Patient was on patient assistance for Semaglutide,0.25 or 0.5MG /DOS, (OZEMPIC, 0.25 OR 0.5 MG/DOSE,) 2 MG/3ML SOPN and Tresiba. [facility] and Patient assistance is unable to help. She was told the paperwork would be taken care of but patient still has not received the medication. Callback number is [phone]

## 2023-06-14 NOTE — Telephone Encounter (Signed)
 Chief Complaint: Burning with urination since yesterday  Symptoms: Dark urine with odor, lower back pain, nausea, feels warm, urinary frequency Pertinent Negatives: Patient denies blood in urine, fever  Disposition: [x] Urgent Care (no appt availability in office)  Additional Notes: This RN advised pt to go to urgent care as no appointment availability in office. Pt states she is going to call her son to see if he can take her. This RN educated pt on when to call back/seek emergent care. Pt verbalized understanding and agrees to plan.     Copied from CRM [id number]. Topic: Clinical - Red Word Triage >> Jun 14, 2023  4:26 PM Baldomero Bone wrote: Red Word that prompted transfer to Nurse Triage: Patient is having symptoms of a bladder infection that seems to be getting worse. Burning, really bad pain, back pain. does not feel like bladder is empty. Callback number is [phone] Reason for Disposition  [1] SEVERE pain with urination (e.g., excruciating) AND [2] not improved after 2 hours of pain medicine and Sitz bath  Answer Assessment - Initial Assessment Questions Chief Complaint: Burning with urination since yesterday- occurs every time pt urinates 10/10 pain Symptoms: Dark urine with odor, lower back pain, nausea, feels warm, urinary frequency Pertinent Negatives: Patient denies blood in urine, fever  Protocols used: Urination Pain - Female-A-AH

## 2023-06-16 DIAGNOSIS — M1712 Unilateral primary osteoarthritis, left knee: Secondary | ICD-10-CM | POA: Diagnosis not present

## 2023-06-16 DIAGNOSIS — S83242A Other tear of medial meniscus, current injury, left knee, initial encounter: Secondary | ICD-10-CM | POA: Diagnosis not present

## 2023-06-16 DIAGNOSIS — M25562 Pain in left knee: Secondary | ICD-10-CM | POA: Diagnosis not present

## 2023-06-16 NOTE — Progress Notes (Signed)
  Because of increased risk of complicated or antibiotic-resistant UTI in those over 65, the standard of care is for an examination and for a urine culture to be obtained. As such, I feel your condition warrants further evaluation and I recommend that you be seen in a face-to-face visit.   NOTE: There will be NO CHARGE for this E-Visit   If you are having a true medical emergency, please call 911.     For an urgent face to face visit, [facility] has multiple urgent care centers for your convenience.  Click the link below for the full list of locations and hours, walk-in wait times, appointment scheduling options and driving directions:  Urgent Care - [place], [place], [place], [place], [place], [place]  [place]     Your MyChart E-visit questionnaire answers were reviewed by a board certified advanced clinical practitioner to complete your personal care plan based on your specific symptoms.    Thank you for using e-Visits.

## 2023-06-17 ENCOUNTER — Ambulatory Visit: Payer: Self-pay | Admitting: Nurse Practitioner

## 2023-06-17 NOTE — Telephone Encounter (Signed)
 Chief Complaint: Elevated blood sugar  Symptoms: Denies any symptoms Frequency: Ongoing since yesterday steroid injection Pertinent Negatives: Patient denies fever, frequent urination, difficulty breathing, dizziness, weakness, vomiting  Disposition: [x] ED [x] Refused Recommended Disposition   Additional Notes: Pt states she received another steroid injection in knee yesterday morning and her blood glucose has been elevated since then. Current blood glucose level is 545. This RN advised pt to go to ED but pt refused and states she might go to urgent care today. This RN unable to notify [facility] as office is closed today. This RN will send a high priority message.   Copied from CRM [id number]. Topic: Clinical - Red Word Triage >> Jun 17, 2023 10:54 AM Alexandra Hunt wrote: [facility] that prompted transfer to Nurse Triage: Patient called in stated that her blood sugar is 545 Reason for Disposition  Blood glucose > 500 mg/dL (21.3 mmol/L)  Answer Assessment - Initial Assessment Questions Chief Complaint: Elevated blood sugar  Symptoms: Denies any symptoms  Frequency: Ongoing since yesterday steroid injection  Pertinent Negatives: Patient denies fever, frequent urination, difficulty breathing, dizziness, weakness, vomiting  Protocols used: Diabetes - High Blood Sugar-A-AH

## 2023-06-20 ENCOUNTER — Encounter: Payer: Self-pay | Admitting: Nurse Practitioner

## 2023-06-20 ENCOUNTER — Ambulatory Visit: Admitting: Nurse Practitioner

## 2023-06-20 VITALS — BP 115/68 | HR 86 | Temp 97.5°F | Ht 64.0 in | Wt 200.0 lb

## 2023-06-20 DIAGNOSIS — E1165 Type 2 diabetes mellitus with hyperglycemia: Secondary | ICD-10-CM | POA: Diagnosis not present

## 2023-06-20 DIAGNOSIS — E785 Hyperlipidemia, unspecified: Secondary | ICD-10-CM | POA: Diagnosis not present

## 2023-06-20 DIAGNOSIS — E1169 Type 2 diabetes mellitus with other specified complication: Secondary | ICD-10-CM | POA: Diagnosis not present

## 2023-06-20 DIAGNOSIS — Z6834 Body mass index (BMI) 34.0-34.9, adult: Secondary | ICD-10-CM

## 2023-06-20 DIAGNOSIS — M8588 Other specified disorders of bone density and structure, other site: Secondary | ICD-10-CM | POA: Diagnosis not present

## 2023-06-20 DIAGNOSIS — Z0289 Encounter for other administrative examinations: Secondary | ICD-10-CM

## 2023-06-20 DIAGNOSIS — F411 Generalized anxiety disorder: Secondary | ICD-10-CM

## 2023-06-20 DIAGNOSIS — E1142 Type 2 diabetes mellitus with diabetic polyneuropathy: Secondary | ICD-10-CM | POA: Diagnosis not present

## 2023-06-20 DIAGNOSIS — I10 Essential (primary) hypertension: Secondary | ICD-10-CM

## 2023-06-20 DIAGNOSIS — F339 Major depressive disorder, recurrent, unspecified: Secondary | ICD-10-CM | POA: Diagnosis not present

## 2023-06-20 DIAGNOSIS — R7989 Other specified abnormal findings of blood chemistry: Secondary | ICD-10-CM

## 2023-06-20 DIAGNOSIS — G8929 Other chronic pain: Secondary | ICD-10-CM

## 2023-06-20 DIAGNOSIS — M25562 Pain in left knee: Secondary | ICD-10-CM

## 2023-06-20 LAB — LIPID PANEL

## 2023-06-20 LAB — BAYER DCA HB A1C WAIVED: HB A1C (BAYER DCA - WAIVED): 8.5 % — ABNORMAL HIGH (ref 4.8–5.6)

## 2023-06-20 MED ORDER — DAPAGLIFLOZIN PROPANEDIOL 5 MG PO TABS: 5.0000 mg | ORAL_TABLET | Freq: Every day | ORAL | 1 refills | Status: DC

## 2023-06-20 MED ORDER — INSULIN DEGLUDEC 100 UNIT/ML ~~LOC~~ SOLN: 100.0000 [IU] | Freq: Two times a day (BID) | SUBCUTANEOUS | Status: DC

## 2023-06-20 MED ORDER — LORAZEPAM 1 MG PO TABS: 1.0000 mg | ORAL_TABLET | Freq: Three times a day (TID) | ORAL | 2 refills | Status: DC

## 2023-06-20 MED ORDER — FENOFIBRATE 160 MG PO TABS: 160.0000 mg | ORAL_TABLET | Freq: Every day | ORAL | 1 refills | Status: DC

## 2023-06-20 MED ORDER — TRAMADOL HCL 50 MG PO TABS: 50.0000 mg | ORAL_TABLET | Freq: Three times a day (TID) | ORAL | 2 refills | Status: AC | PRN

## 2023-06-20 MED ORDER — OZEMPIC (0.25 OR 0.5 MG/DOSE) 2 MG/3ML ~~LOC~~ SOPN
0.5000 mg | PEN_INJECTOR | SUBCUTANEOUS | 0 refills | Status: AC
Start: 2023-06-20 — End: ?

## 2023-06-20 MED ORDER — SERTRALINE HCL 100 MG PO TABS
200.0000 mg | ORAL_TABLET | Freq: Every day | ORAL | 1 refills | Status: DC
Start: 2023-06-20 — End: 2023-09-15

## 2023-06-20 MED ORDER — FUROSEMIDE 20 MG PO TABS: ORAL_TABLET | ORAL | 1 refills | Status: DC

## 2023-06-20 MED ORDER — GABAPENTIN 100 MG PO CAPS: ORAL_CAPSULE | ORAL | 1 refills | Status: DC

## 2023-06-20 MED ORDER — LISINOPRIL 20 MG PO TABS: 20.0000 mg | ORAL_TABLET | Freq: Every day | ORAL | 1 refills | Status: DC

## 2023-06-20 NOTE — Patient Instructions (Signed)
 Opioid Pain Medicine Management Opioid pain medicines are strong medicines that are used to treat bad or very bad pain. When you take them for a short time, they can help you: Sleep better. Do better in physical therapy. Feel better during the first few days after you get hurt. Recover from surgery. Only take these medicines if a doctor says that you can. You should only take them for a short time. This is because opioids can be very addictive. This means that they are hard to stop taking. The longer you take opioids, the harder it may be to stop taking them. What are the risks? Opioids can cause problems (side effects). Taking them for more than 3 days raises your chance of problems, such as: Trouble pooping (constipation). Feeling sick to your stomach (nausea). Vomiting. Feeling very sleepy. Confusion. Not being able to stop taking the medicine. Breathing problems. Taking opioids for a long time can make it hard for you to do daily tasks. It can also put you at risk for: Car accidents. Depression. Suicide. Heart attack. Taking too much of the medicine (overdose). This can lead to death. What is a pain treatment plan? A pain treatment plan is a plan made by you and your doctor. Work with your doctor to make a plan for treating your pain. To help you do this: Talk about the goals of your treatment, including: How much pain you might expect to have. How you will manage the pain. Talk about the risks and benefits of taking these medicines for your condition. Remember that a good treatment plan uses more than one approach and lowers the risks of side effects. Tell your doctor about the amount of medicines you take and about any drug or alcohol  use. Get your pain medicine prescriptions from only one doctor. Pain can be managed with other treatments. Work with your doctor to find other ways to help your pain, such as: Physical therapy or doing gentle exercises. Counseling. Eating healthy  foods. Massage. Meditation. Other pain medicines. How to use opioid pain medicine safely Taking medicine Take your pain medicine exactly as told by your doctor. Take it only when you need it. If your pain is not too bad, you may take less medicine if your doctor allows. If you have no pain, do not take the medicine unless your doctor tells you to take it. If your pain is very bad, do not take more medicine than your doctor told you to take. Call your doctor to know what to do. Write down the times when you take your pain medicine. Look at the times before you take your next dose. Take other over-the-counter or prescription medicines only as told by your doctor. Keeping yourself and others safe  While you are taking opioids: Do not drive, use machines, or power tools. Do not sign important papers (legal documents). Do not drink alcohol . Do not take sleeping pills. Do not take care of children by yourself. Do not do activities where you need to climb or be in high places, like working on a ladder. Do not go to a lake, river, ocean, swimming pool, or hot tub. Keep your opioids locked up or in a place where children cannot reach them. Do not share your pain medicine with anyone. Stopping your use of opioids If you have been taking opioids for more than a few weeks, you may need to slowly decrease (taper) how much you take until you stop taking them. Doing this can lower your chance  of having symptoms.  Symptoms that come from suddenly stopping the use of opioids include: Pain and cramping in your belly (abdomen). Feeling sick to your stomach (nausea).z Sweating. Feeling very sleepy. Feeling restless. Shaking you cannot control (tremors). Cravings for the medicine. Do not try to stop taking them by yourself. Work with your doctor to stop. Your doctor will help you take less until you are not taking the medicine at all. Getting rid of unused pills Do not save any pills that you did not  use. Get rid of the pills by: Taking them to a take-back program in your area. Bringing them to a pharmacy that receives unused pills. Flushing them down the toilet. Check the label or package insert of your medicine to see whether this is safe to do. Throwing them in the trash. Check the label or package insert of your medicine to see whether this is safe to do. If it is safe to throw them out: Take the pills out of their container. Put the pills into a container you can seal. Mix the pills with used coffee grounds, food scraps, dirt, or cat litter. Put this in the trash. Follow these instructions at home: Activity Do exercises as told by your doctor. Avoid doing things that make your pain worse. Return to your normal activities as told by your doctor. Ask your doctor what activities are safe for you. General instructions You may need to take these actions to prevent or treat constipation: Drink enough fluid to keep your pee (urine) pale yellow. Take over-the-counter or prescription medicines. Eat foods that are high in fiber. These include beans, whole grains, and fresh fruits and vegetables. Limit foods that are high in fat and sugar. These include fried or sweet foods. Keep all follow-up visits. Where to find support If you have been taking opioids for a long time, get help from a local support group or counselor. Ask your doctor about this. Where to find more information Centers for Disease Control and Prevention (CDC): FootballExhibition.com.br [place] Food and Drug Administration (FDA): PumpkinSearch.com.ee Get help right away if: You may have taken too much of an opioid (overdosed). Common symptoms of an overdose: Your breathing is slower or more shallow than normal. You have a very slow heartbeat. Your speech is not normal. You vomit or you feel as if you may vomit. The black centers of your eyes (pupils) are smaller than normal. You have other potential symptoms: You feel very confused. You  faint. You are very sleepy. You have cold skin. You have blue lips or fingernails. You have thoughts of harming yourself or harming others. These symptoms may be an emergency. Get help right away. Call your local emergency services (911 in the [place] Do not wait to see if the symptoms will go away. Do not drive yourself to the hospital. Get help right away if you feel like you may hurt yourself or others, or have thoughts about taking your own life. Go to your nearest emergency room or: Call your local emergency services (911 in the [place] Call the [facility] at [phone]. Call a suicide crisis helpline, such as the National Suicide Prevention Lifeline at 757 553 7664 or 988 in the [place] This is open 24 hours a day. If you're a Veteran: Call 988 and press 1. This is open 24 hours a day. Text the [employer] at (857) 409-0537. Summary Opioid are strong medicines that are used to treat bad or very bad pain. A pain treatment plan  is a plan made by you and your doctor. Work with your doctor to make a plan for treating your pain. If you think that you or someone else may have taken too much of an opioid, get help right away. This information is not intended to replace advice given to you by your health care provider. Make sure you discuss any questions you have with your health care provider. Document Revised: 09/30/2022 Document Reviewed: 05/28/2020 Elsevier Patient Education  2024 [employer].

## 2023-06-20 NOTE — Progress Notes (Signed)
 Subjective:    Patient ID: Alexandra Hunt, female    DOB: 1945/05/31, 78 y.o.   MRN: [id number]   Chief Complaint: Medical Management of Chronic Issues    HPI:  Alexandra Hunt is a 78 y.o. who identifies as a female who was assigned female at birth.   Social history: Lives with: kids and grandkids Work history: retired Scientist, clinical (histocompatibility and immunogenetics) in today for follow up of the following chronic medical issues:  1. Essential hypertension, malignant No c/o chest pain, sob or headache. Does not check blood pressure at home very often. When she does check it it is in the 130's systolic. BP Readings from Last 3 Encounters:  03/03/23 (!) 140/60  01/31/23 (!) 140/61  11/29/22 (!) 160/65      2. Hyperlipidemia associated with type 2 diabetes mellitus (HCC) Does not watch diet and does no dedicated exercise. Lab Results  Component Value Date   CHOL 174 01/31/2023   HDL 40 01/31/2023   LDLCALC 100 (H) 01/31/2023   TRIG 200 (H) 01/31/2023   CHOLHDL 4.4 01/31/2023     3. Uncontrolled type 2 diabetes mellitus with hyperglycemia (HCC) Fasting blood sugars are running 65-200. She has ran out of ozempic  and pharmacy has not sent it to her. She had asteroid injection several days ago and bood sugars have really been high since then. Lab Results  Component Value Date   HGBA1C 8.3 (H) 01/31/2023     4. Diabetic polyneuropathy associated with type 2 diabetes mellitus (HCC) Has numbness and tingling in bil feet all the time.   5. Episode of recurrent major depressive disorder, unspecified depression episode severity (HCC) Is on zoloft  daily and is doing well.    06/20/2023    3:14 PM 02/18/2023    3:23 PM 01/31/2023    3:14 PM  Depression screen PHQ 2/9  Decreased Interest 2 2 2   Down, Depressed, Hopeless 1 1 1   PHQ - 2 Score 3 3 3   Altered sleeping 3 3 3   Tired, decreased energy 3 1 1   Change in appetite 2 1 1   Feeling bad or failure about yourself  1 1 1   Trouble concentrating 0 0 0   Moving slowly or fidgety/restless 0 0 0  Suicidal thoughts 0 0 0  PHQ-9 Score 12 9 9   Difficult doing work/chores Somewhat difficult Not difficult at all Somewhat difficult       6. GAD (generalized anxiety disorder) Stays anxious. Her living situation makes her nervous all the time.    06/20/2023    3:15 PM 01/31/2023    3:15 PM 11/11/2022    3:28 PM 04/09/2022    3:58 PM  GAD 7 : Generalized Anxiety Score  Nervous, Anxious, on Edge 1 1 0 2  Control/stop worrying 2 2 0 2  Worry too much - different things 2 2 0 2  Trouble relaxing 2 1 0 1  Restless 1 0 0 1  Easily annoyed or irritable 1 0 0 0  Afraid - awful might happen 1 1 0 1  Total GAD 7 Score 10 7 0 9  Anxiety Difficulty Somewhat difficult Somewhat difficult Not difficult at all Somewhat difficult        7. Low serum vitamin D  Is on daily vitamin d  supplement when she remembers to take  8. Stress incontinence of urine Is on myrbetriq  which is working well for her  9. Osteopenia of lumbar spine Last dexascan was done 07/22/22. T score was -  1.7  10. BMI 34.0-34.9,adult No recent weight changes Wt Readings from Last 3 Encounters:  06/20/23 200 lb (90.7 kg)  03/03/23 203 lb (92.1 kg)  02/18/23 202 lb (91.6 kg)   BMI Readings from Last 3 Encounters:  06/20/23 34.33 kg/m  03/03/23 34.84 kg/m  02/18/23 34.67 kg/m      New complaints: Chronic pain- her eft knee has been hurting for several years. She fell after she had a steroid injection and she says pain is stabbing. Rates 10/10. Worse when walking or standing. Cannot keep getting steroid injections because it increases her blood sugar.  [place]  Controlled Substance Abuse database reviewed- Yes If yes- were their any concerning findings : no     06/20/2023    3:14 PM 02/18/2023    3:23 PM 01/31/2023    3:14 PM 11/11/2022    3:28 PM 04/09/2022    3:58 PM  Depression screen PHQ 2/9  Decreased Interest 2 2 2  0 3  Down, Depressed, Hopeless 1 1 1  0 2   PHQ - 2 Score 3 3 3  0 5  Altered sleeping 3 3 3  0 2  Tired, decreased energy 3 1 1  0 2  Change in appetite 2 1 1  0 2  Feeling bad or failure about yourself  1 1 1  0 2  Trouble concentrating 0 0 0 0 0  Moving slowly or fidgety/restless 0 0 0 0 0  Suicidal thoughts 0 0 0 0 0  PHQ-9 Score 12 9 9  0 13  Difficult doing work/chores Somewhat difficult Not difficult at all Somewhat difficult Not difficult at all Somewhat difficult       06/20/2023    3:15 PM 01/31/2023    3:15 PM 11/11/2022    3:28 PM 04/09/2022    3:58 PM  GAD 7 : Generalized Anxiety Score  Nervous, Anxious, on Edge 1 1 0 2  Control/stop worrying 2 2 0 2  Worry too much - different things 2 2 0 2  Trouble relaxing 2 1 0 1  Restless 1 0 0 1  Easily annoyed or irritable 1 0 0 0  Afraid - awful might happen 1 1 0 1  Total GAD 7 Score 10 7 0 9  Anxiety Difficulty Somewhat difficult Somewhat difficult Not difficult at all Somewhat difficult       [facility] drug screen performed- Yes  SOAPP  0= never  1= seldom  2=sometimes  3= often  4= very often  How often do you have mood swings? 0 How often do you smoke a cigarette within an hour after waling up? 0 How often have you taken medication other than the way that it was prescribed?0 How often have you used illegal drugs in the past 5 years? 0 How often, in your lifetime, have you had legal problems or been arrested? 0  Score 0  Alcohol  Audit - How often during the last year have found that you: 0-Never   1- Less than monthly   2- Monthly     3-Weekly     4-daily or almost daily  - found that you were not able to stop drinking once you started- 0 -failed to do what was normally expected of you because of drinking- 0 -needed a first drink in the morning- 0 -had a feeling of guilt or remorse after drinking- 0 -are/were unable to remember what happened the night before because of your drinking- 0  0- NO   2- yes but not in  last year  4- yes during last year -Have  you or someone else been injured because of your drinking- 0 - Has anyone been concerned about your drinking or suggested you cut down- 0        TOTAL- 0  ( 0-7- alcohol  education, 8-15- simple advice, 16-19 simple advice plus counseling, 20-40 referral for evaluation and treatment )     Designated Pharmacy- [facility]  Pain assessment: Cause of pain- left knee degenerative changes Pain location- left knee Pain on scale of 1-10- 10/10 Frequency- daily What increases pain-standing and walking What makes pain Better-rest helps some Effects on ADL - none  Prior treatments tried and failed- OC meds, steroid injections and ice Current opioids rx- none # prescribed- 0 Morphine  mg equivalent- 0  Pain management agreement reviewed and signed- Yes   Allergies  Allergen Reactions   Penicillins     SOB,rash   Ivp Dye [Iodinated Contrast Media]     Burning at IV site   Statins     myalgias   Codeine     nauseated   Latex     rash   Sulfa Antibiotics     nauseated   Outpatient Encounter Medications as of 06/20/2023  Medication Sig   albuterol  (VENTOLIN  HFA) 108 (90 Base) MCG/ACT inhaler Inhale 2 puffs into the lungs every 6 (six) hours as needed for wheezing or shortness of breath.   Alcohol  Swabs (B-D SINGLE USE SWABS REGULAR) PADS TEST BS UP TO FOUR TIMES DAILY Dx E11.9   ALPHAGAN  P 0.1 % SOLN APPLY 1 DROP TO EYE 2 TIMES DAILY.   aspirin 81 MG tablet Take 81 mg by mouth daily.   B-D UF III MINI PEN NEEDLES 31G X 5 MM MISC USE WITH SLIDING SCALE HUMALOG 4 TIMES DAILY DX E11.9   Blood Glucose Calibration (TRUE METRIX LEVEL 1) Low SOLN Use with glucose machine Dx E11.9,   Blood Glucose Monitoring Suppl (TRUE METRIX METER) w/Device KIT TEST BS UP TO FOUR TIMES DAILY Dx E11.9   Continuous Blood Gluc Receiver (FREESTYLE LIBRE 2 READER) DEVI Use to test blood sugar 4-6 times daily as directed DX: E11.9   [facility] LUBRICANT EYE DROPS 0.6 % SOLN Apply 1 drop to eye 2 (two) times daily.    dapagliflozin  propanediol (FARXIGA ) 5 MG TABS tablet Take 1 tablet (5 mg total) by mouth daily.   diclofenac (VOLTAREN) 75 MG EC tablet TAKE 1 TABLET BY MOUTH TWICE A DAY AS NEEDED   dorzolamide (TRUSOPT) 2 % ophthalmic solution Place 2 drops into both eyes 2 (two) times daily.   fenofibrate  160 MG tablet Take 1 tablet (160 mg total) by mouth daily.   fluticasone  (FLONASE ) 50 MCG/ACT nasal spray SPRAY 2 SPRAYS INTO EACH NOSTRIL EVERY DAY   furosemide  (LASIX ) 20 MG tablet TAKE 1 TABLET BY MOUTH EVERY DAY   gabapentin  (NEURONTIN ) 100 MG capsule Take 1 capsule (100mg ) by mouth in the morning, 1 capsule (100mg ) in the afternoon, & 2 capsules (200mg ) in the evening   glucose blood (TRUE METRIX BLOOD GLUCOSE TEST) test strip TEST BS UP TO FOUR TIMES DAILY Dx E11.9   ibuprofen  (ADVIL ) 800 MG tablet TAKE 1 TABLET BY MOUTH EVERY 8 HOURS AS NEEDED   insulin  glargine (LANTUS  SOLOSTAR) 100 UNIT/ML Solostar Pen Inject 90 Units into the skin 2 (two) times daily.   insulin  regular (NOVOLIN R) 100 units/mL injection INJECT 22UNITS 4 TIMES A DAY PER SLIDING SCALE   Insulin  Syringe-Needle U-100 (BD INSULIN   SYRINGE U/F) 31G X 5/16" 1 ML MISC UAD w/ Lantus  and Humalog insulin  sliding scale Dx E11.9   Lancet Devices (TRUEDRAW LANCING DEVICE) MISC TEST BS UP TO FOUR TIMES DAILY Dx E11.9   lisinopril  (ZESTRIL ) 20 MG tablet Take 1 tablet (20 mg total) by mouth daily.   LORazepam  (ATIVAN ) 1 MG tablet Take 1 tablet (1 mg total) by mouth 3 (three) times daily.   meclizine  (ANTIVERT ) 25 MG tablet Take 1 tablet (25 mg total) by mouth 3 (three) times daily as needed for dizziness.   mirabegron  ER (MYRBETRIQ ) 50 MG TB24 tablet Take 1 tablet (50 mg total) by mouth daily.   mupirocin  cream (BACTROBAN ) 2 % APPLY TO AFFECTED AREA TWICE A DAY   MYRBETRIQ  25 MG TB24 tablet TAKE 1 TABLET (25 MG TOTAL) BY MOUTH DAILY.   nystatin  (MYCOSTATIN ) 100000 UNIT/ML suspension Take 5 mLs (500,000 Units total) by mouth 4 (four) times daily.    ondansetron  (ZOFRAN ) 4 MG tablet    Semaglutide ,0.25 or 0.5MG /DOS, (OZEMPIC , 0.25 OR 0.5 MG/DOSE,) 2 MG/3ML SOPN Inject 0.5 mg into the skin once a week.   sertraline  (ZOLOFT ) 100 MG tablet Take 2 tablets (200 mg total) by mouth daily.   TRUEplus Lancets 33G MISC TEST BS UP TO FOUR TIMES DAILY Dx E11.9   Vitamin D , Ergocalciferol , (DRISDOL ) 1.25 MG (50000 UNIT) CAPS capsule TAKE 1 CAPSULE EVERY 7 DAYS   No facility-administered encounter medications on file as of 06/20/2023.    Past Surgical History:  Procedure Laterality Date   ABDOMINAL HYSTERECTOMY     due to uterine cancer   BREAST SURGERY     left breast biopsy/benign   TUBAL LIGATION      Family History  Problem Relation Age of Onset   Diabetes Mother    Blindness Mother        related to diabetes   Stroke Mother    Heart disease Father        MI   Heart attack Father    Heart defect Father    Breast cancer Sister    Diabetes Brother        diet controlled   Colon cancer Neg Hx    Esophageal cancer Neg Hx    Rectal cancer Neg Hx    Stomach cancer Neg Hx       Controlled substance contract: n/a     Review of Systems  Constitutional:  Negative for diaphoresis.  Eyes:  Negative for pain.  Respiratory:  Negative for shortness of breath.   Cardiovascular:  Negative for chest pain, palpitations and leg swelling.  Gastrointestinal:  Negative for abdominal pain.  Endocrine: Negative for polydipsia.  Skin:  Negative for rash.  Neurological:  Negative for dizziness, weakness and headaches.  Hematological:  Does not bruise/bleed easily.  All other systems reviewed and are negative.      Objective:   Physical Exam Vitals and nursing note reviewed.  Constitutional:      General: She is not in acute distress.    Appearance: Normal appearance. She is well-developed.  HENT:     Head: Normocephalic.     Right Ear: Tympanic membrane normal.     Left Ear: Tympanic membrane normal.     Nose: Nose normal.      Mouth/Throat:     Mouth: Mucous membranes are moist.  Eyes:     Pupils: Pupils are equal, round, and reactive to light.  Neck:     Vascular: No carotid bruit or JVD.  Cardiovascular:     Rate and Rhythm: Normal rate and regular rhythm.     Heart sounds: Normal heart sounds.  Pulmonary:     Effort: Pulmonary effort is normal. No respiratory distress.     Breath sounds: Normal breath sounds. No wheezing or rales.  Chest:     Chest wall: No tenderness.  Abdominal:     General: Bowel sounds are normal. There is no distension or abdominal bruit.     Palpations: Abdomen is soft. There is no hepatomegaly, splenomegaly, mass or pulsatile mass.     Tenderness: There is no abdominal tenderness.  Musculoskeletal:        General: Normal range of motion.     Cervical back: Normal range of motion and neck supple.  Lymphadenopathy:     Cervical: No cervical adenopathy.  Skin:    General: Skin is warm and dry.  Neurological:     Mental Status: She is alert and oriented to person, place, and time.     Deep Tendon Reflexes: Reflexes are normal and symmetric.  Psychiatric:        Behavior: Behavior normal.        Thought Content: Thought content normal.        Judgment: Judgment normal.     BP 115/68   Pulse 86   Temp (!) 97.5 F (36.4 C) (Temporal)   Ht 5\' 4"  (1.626 m)   Wt 200 lb (90.7 kg)   SpO2 97%   BMI 34.33 kg/m    HGBa1c 8.5%      Assessment & Plan:   BESSIE BOYTE comes in today with chief complaint of Medical Management of Chronic Issues   Diagnosis and orders addressed:  1. Essential hypertension, malignant Low sodium diet - CBC with Differential/Platelet - CMP14+EGFR  2. Hyperlipidemia associated with type 2 diabetes mellitus (HCC) Low fat diet - Lipid panel  3. Uncontrolled type 2 diabetes mellitus with hyperglycemia (HCC) Get back on ozempic  weekly Increase tresiba  to 100 u bid - Bayer DCA Hb A1c Waived - Microalbumin / creatinine urine ratio -  Semaglutide ,0.25 or 0.5MG /DOS, (OZEMPIC , 0.25 OR 0.5 MG/DOSE,) 2 MG/3ML SOPN; Inject 0.5 mg into the skin once a week.  Dispense: 9 mL; Refill: 0 -tresiba  samples from company 4. Diabetic polyneuropathy associated with type 2 diabetes mellitus (HCC) Check feet daily  5. Episode of recurrent major depressive disorder, unspecified depression episode severity (HCC) Stress management  6. GAD (generalized anxiety disorder) Stress management - LORazepam  (ATIVAN ) 1 MG tablet; Take 1 tablet (1 mg total) by mouth 3 (three) times daily.  Dispense: 90 tablet; Refill: 2  7. Low serum vitamin D  Vitamin d  supplement  8. Stress incontinence of urine Keep appt with urology  Thursday this week  9. Osteopenia of lumbar spine Weight bearing exercise  10. BMI 32.0-32.9,adult Discussed diet and exercise for person with BMI >25 Will recheck weight in 3-6 months  11. Left knee pain ultram  50mg  BID #60 2 refills Keep follow up appt with ortho  Labs pending Health Maintenance reviewed Diet and exercise encouraged  Follow up plan: 3 months   Mary-Margaret Gaylyn Keas, FNP

## 2023-06-21 LAB — CMP14+EGFR
ALT: 13 IU/L (ref 0–32)
AST: 21 IU/L (ref 0–40)
Albumin: 4.1 g/dL (ref 3.8–4.8)
Alkaline Phosphatase: 70 IU/L (ref 44–121)
BUN/Creatinine Ratio: 25 (ref 12–28)
BUN: 27 mg/dL (ref 8–27)
Bilirubin Total: 0.2 mg/dL (ref 0.0–1.2)
CO2: 25 mmol/L (ref 20–29)
Calcium: 9.4 mg/dL (ref 8.7–10.3)
Chloride: 103 mmol/L (ref 96–106)
Creatinine, Ser: 1.07 mg/dL — ABNORMAL HIGH (ref 0.57–1.00)
Globulin, Total: 2.7 g/dL (ref 1.5–4.5)
Glucose: 70 mg/dL (ref 70–99)
Potassium: 5.2 mmol/L (ref 3.5–5.2)
Sodium: 141 mmol/L (ref 134–144)
Total Protein: 6.8 g/dL (ref 6.0–8.5)
eGFR: 54 mL/min/{1.73_m2} — ABNORMAL LOW (ref >59)

## 2023-06-21 LAB — CBC WITH DIFFERENTIAL/PLATELET
Basophils Absolute: 0 10*3/uL (ref 0.0–0.2)
Basos: 0 %
EOS (ABSOLUTE): 0.2 10*3/uL (ref 0.0–0.4)
Eos: 1 %
Hematocrit: 42 % (ref 34.0–46.6)
Hemoglobin: 13.1 g/dL (ref 11.1–15.9)
Immature Grans (Abs): 0 10*3/uL (ref 0.0–0.1)
Immature Granulocytes: 0 %
Lymphocytes Absolute: 3.6 10*3/uL — ABNORMAL HIGH (ref 0.7–3.1)
Lymphs: 24 %
MCH: 24.3 pg — ABNORMAL LOW (ref 26.6–33.0)
MCHC: 31.2 g/dL — ABNORMAL LOW (ref 31.5–35.7)
MCV: 78 fL — ABNORMAL LOW (ref 79–97)
Monocytes Absolute: 1 10*3/uL — ABNORMAL HIGH (ref 0.1–0.9)
Monocytes: 7 %
Neutrophils Absolute: 10 10*3/uL — ABNORMAL HIGH (ref 1.4–7.0)
Neutrophils: 68 %
Platelets: 368 10*3/uL (ref 150–450)
RBC: 5.38 x10E6/uL — ABNORMAL HIGH (ref 3.77–5.28)
RDW: 14.5 % (ref 11.7–15.4)
WBC: 14.8 10*3/uL — ABNORMAL HIGH (ref 3.4–10.8)

## 2023-06-21 LAB — LIPID PANEL
Cholesterol, Total: 134 mg/dL (ref 100–199)
HDL: 39 mg/dL — ABNORMAL LOW (ref >39)
LDL CALC COMMENT:: 3.4 ratio (ref 0.0–4.4)
LDL Chol Calc (NIH): 66 mg/dL (ref 0–99)
Triglycerides: 169 mg/dL — ABNORMAL HIGH (ref 0–149)
VLDL Cholesterol Cal: 29 mg/dL (ref 5–40)

## 2023-06-21 LAB — MICROALBUMIN / CREATININE URINE RATIO
Creatinine, Urine: 55.8 mg/dL
Microalb/Creat Ratio: 40 mg/g{creat} — ABNORMAL HIGH (ref 0–29)
Microalbumin, Urine: 22.3 ug/mL

## 2023-06-22 ENCOUNTER — Ambulatory Visit: Payer: Medicare HMO | Admitting: Urology

## 2023-06-22 VITALS — BP 150/62 | HR 98

## 2023-06-22 DIAGNOSIS — R32 Unspecified urinary incontinence: Secondary | ICD-10-CM | POA: Diagnosis not present

## 2023-06-22 LAB — URINALYSIS, ROUTINE W REFLEX MICROSCOPIC
Bilirubin, UA: NEGATIVE
Ketones, UA: NEGATIVE
Leukocytes,UA: NEGATIVE
Nitrite, UA: NEGATIVE
Protein,UA: NEGATIVE
RBC, UA: NEGATIVE
Specific Gravity, UA: 1.01 (ref 1.005–1.030)
Urobilinogen, Ur: 0.2 mg/dL (ref 0.2–1.0)
pH, UA: 6 (ref 5.0–7.5)

## 2023-06-22 MED ORDER — SOLIFENACIN SUCCINATE 10 MG PO TABS: 10.0000 mg | ORAL_TABLET | Freq: Every day | ORAL | 11 refills | Status: AC

## 2023-06-22 NOTE — Progress Notes (Signed)
 06/22/2023 3:22 PM   Alexandra Hunt 11/10/1945 [id number]  Referring provider: Delfina Feller, FNP [address] [place],  [place] [address]  Followup urinary incontinence   HPI: Alexandra Hunt is a 77yo here for followup for urinary incontinence. She has nocturnal enuresis nightly. She multiple episodes of urge incontinence. She uses a walker. She is currently on mirabegron  50mg  daily. She was previously on detrol  LA which failed to improve her urge incontinence.   PMH: Past Medical History:  Diagnosis Date   Benign breast lumps    Cancer (HCC) 2007   uterine   Cervical radiculopathy    Cervical radiculopathy    Change in bowel habits    soft stools   Depression    Diabetes mellitus without complication (HCC)    type 2 diabetic   History of anal fissures    Hx of adenomatous colonic polyps 03/20/2018   Hx of gallstones    Hyperlipidemia    Hypertension    Schizoid personality disorder (HCC)    pt not taking her meds  (pt states this is an inaccurate diagnosis and wants it removed) 03-15-2018   Skin cancer    on arms/right hand   Sleep apnea     Surgical History: Past Surgical History:  Procedure Laterality Date   ABDOMINAL HYSTERECTOMY     due to uterine cancer   BREAST SURGERY     left breast biopsy/benign   TUBAL LIGATION      Home Medications:  Allergies as of 06/22/2023       Reactions   Penicillins    SOB,rash   Ivp Dye [iodinated Contrast Media]    Burning at IV site   Statins    myalgias   Codeine    nauseated   Latex    rash   Sulfa Antibiotics    nauseated        Medication List        Accurate as of June 22, 2023  3:22 PM. If you have any questions, ask your nurse or doctor.          albuterol  108 (90 Base) MCG/ACT inhaler Commonly known as: VENTOLIN  HFA Inhale 2 puffs into the lungs every 6 (six) hours as needed for wheezing or shortness of breath.   Alphagan  P 0.1 % Soln Generic drug: brimonidine  APPLY 1 DROP TO  EYE 2 TIMES DAILY.   aspirin 81 MG tablet Take 81 mg by mouth daily.   B-D SINGLE USE SWABS REGULAR Pads TEST BS UP TO FOUR TIMES DAILY Dx E11.9   B-D UF III MINI PEN NEEDLES 31G X 5 MM Misc Generic drug: Insulin  Pen Needle USE WITH SLIDING SCALE HUMALOG 4 TIMES DAILY DX E11.9   [facility] Lubricant Eye Drops 0.6 % Soln Generic drug: Propylene Glycol Apply 1 drop to eye 2 (two) times daily.   dapagliflozin  propanediol 5 MG Tabs tablet Commonly known as: Farxiga  Take 1 tablet (5 mg total) by mouth daily.   diclofenac 75 MG EC tablet Commonly known as: VOLTAREN TAKE 1 TABLET BY MOUTH TWICE A DAY AS NEEDED   fenofibrate  160 MG tablet Take 1 tablet (160 mg total) by mouth daily.   fluticasone  50 MCG/ACT nasal spray Commonly known as: FLONASE  SPRAY 2 SPRAYS INTO EACH NOSTRIL EVERY DAY   FreeStyle Libre 2 Reader Devi Use to test blood sugar 4-6 times daily as directed DX: E11.9   furosemide  20 MG tablet Commonly known as: LASIX  TAKE 1 TABLET BY MOUTH EVERY  DAY   gabapentin  100 MG capsule Commonly known as: NEURONTIN  Take 1 capsule (100mg ) by mouth in the morning, 1 capsule (100mg ) in the afternoon, & 2 capsules (200mg ) in the evening   ibuprofen  800 MG tablet Commonly known as: ADVIL  TAKE 1 TABLET BY MOUTH EVERY 8 HOURS AS NEEDED   Insulin  Degludec 100 UNIT/ML Soln Commonly known as: Tresiba  Inject 100 Units into the skin 2 (two) times daily.   insulin  regular 100 units/mL injection Commonly known as: NovoLIN R INJECT 22UNITS 4 TIMES A DAY PER SLIDING SCALE   Insulin  Syringe-Needle U-100 31G X 5/16" 1 ML Misc Commonly known as: BD Insulin  Syringe U/F UAD w/ Lantus  and Humalog insulin  sliding scale Dx E11.9   Lantus  SoloStar 100 UNIT/ML Solostar Pen Generic drug: insulin  glargine Inject 90 Units into the skin 2 (two) times daily.   lisinopril  20 MG tablet Commonly known as: ZESTRIL  Take 1 tablet (20 mg total) by mouth daily.   LORazepam  1 MG tablet Commonly known  as: ATIVAN  Take 1 tablet (1 mg total) by mouth 3 (three) times daily.   meclizine  25 MG tablet Commonly known as: ANTIVERT  Take 1 tablet (25 mg total) by mouth 3 (three) times daily as needed for dizziness.   mupirocin  cream 2 % Commonly known as: BACTROBAN  APPLY TO AFFECTED AREA TWICE A DAY   ondansetron  4 MG tablet Commonly known as: ZOFRAN    Ozempic  (0.25 or 0.5 MG/DOSE) 2 MG/3ML Sopn Generic drug: Semaglutide (0.25 or 0.5MG /DOS) Inject 0.5 mg into the skin once a week.   sertraline  100 MG tablet Commonly known as: ZOLOFT  Take 2 tablets (200 mg total) by mouth daily.   traMADol  50 MG tablet Commonly known as: ULTRAM  Take 1 tablet (50 mg total) by mouth every 8 (eight) hours as needed.   True Metrix Blood Glucose Test test strip Generic drug: glucose blood TEST BS UP TO FOUR TIMES DAILY Dx E11.9   True Metrix Level 1 Low Soln Use with glucose machine Dx E11.9,   True Metrix Meter w/Device Kit TEST BS UP TO FOUR TIMES DAILY Dx E11.9   TRUEdraw Lancing Device Misc TEST BS UP TO FOUR TIMES DAILY Dx E11.9   TRUEplus Lancets 33G Misc TEST BS UP TO FOUR TIMES DAILY Dx E11.9   Vitamin D  (Ergocalciferol ) 1.25 MG (50000 UNIT) Caps capsule Commonly known as: DRISDOL  TAKE 1 CAPSULE EVERY 7 DAYS        Allergies:  Allergies  Allergen Reactions   Penicillins     SOB,rash   Ivp Dye [Iodinated Contrast Media]     Burning at IV site   Statins     myalgias   Codeine     nauseated   Latex     rash   Sulfa Antibiotics     nauseated    Family History: Family History  Problem Relation Age of Onset   Diabetes Mother    Blindness Mother        related to diabetes   Stroke Mother    Heart disease Father        MI   Heart attack Father    Heart defect Father    Breast cancer Sister    Diabetes Brother        diet controlled   Colon cancer Neg Hx    Esophageal cancer Neg Hx    Rectal cancer Neg Hx    Stomach cancer Neg Hx     Social History:  reports that  she has never  smoked. She has never been exposed to tobacco smoke. She has never used smokeless tobacco. She reports that she does not drink alcohol  and does not use drugs.  ROS: All other review of systems were reviewed and are negative except what is noted above in HPI  Physical Exam: BP (!) 150/62   Pulse 98   Constitutional:  Alert and oriented, No acute distress. HEENT: [place] AT, moist mucus membranes.  Trachea midline, no masses. Cardiovascular: No clubbing, cyanosis, or edema. Respiratory: Normal respiratory effort, no increased work of breathing. GI: Abdomen is soft, nontender, nondistended, no abdominal masses GU: No CVA tenderness.  Lymph: No cervical or inguinal lymphadenopathy. Skin: No rashes, bruises or suspicious lesions. Neurologic: Grossly intact, no focal deficits, moving all 4 extremities. Psychiatric: Normal mood and affect.  Laboratory Data: Lab Results  Component Value Date   WBC 14.8 (H) 06/20/2023   HGB 13.1 06/20/2023   HCT 42.0 06/20/2023   MCV 78 (L) 06/20/2023   PLT 368 06/20/2023    Lab Results  Component Value Date   CREATININE 1.07 (H) 06/20/2023    No results found for: "PSA"  No results found for: "TESTOSTERONE"  Lab Results  Component Value Date   HGBA1C 8.5 (H) 06/20/2023    Urinalysis    Component Value Date/Time   APPEARANCEUR Clear 05/24/2022 1553   GLUCOSEU 3+ (A) 05/24/2022 1553   BILIRUBINUR Negative 05/24/2022 1553   PROTEINUR Negative 05/24/2022 1553   UROBILINOGEN negative 03/14/2015 1647   NITRITE Negative 05/24/2022 1553   LEUKOCYTESUR Negative 05/24/2022 1553    Lab Results  Component Value Date   LABMICR 22.3 06/20/2023   WBCUA 6-10 (A) 05/24/2022   LABEPIT >10 (A) 05/24/2022   MUCUS neg 03/14/2015   BACTERIA Few 05/24/2022    Pertinent Imaging:  No results found for this or any previous visit.  No results found for this or any previous visit.  No results found for this or any previous visit.  No  results found for this or any previous visit.  No results found for this or any previous visit.  No results found for this or any previous visit.  No results found for this or any previous visit.  No results found for this or any previous visit.   Assessment & Plan:    1. Urinary incontinence, unspecified type (Primary) We will trial vesicare  10mg   - Urinalysis, Routine w reflex microscopic   No follow-ups on file.  Johnie Nailer, MD  [facility] [place]

## 2023-06-27 ENCOUNTER — Telehealth: Payer: Self-pay

## 2023-06-27 NOTE — Telephone Encounter (Signed)
 Dysuria  Patient called with c/o dysuria x 2-3 days days.  Pain: burning  Severity:6/10  Associated Signs and Symptoms:  Fever: noTemp.0 Chills: no Hematuria: no Urgency: yes Frequency: yes Hesitancy:no Incontinence: yes Nausea: no Vomiting: no  Urologic History:  Any Recent Urologic Surgeries or Procedures:no Recurrent UTI's:no Cystitis: no  Prostatitis:no Kidney or Bladder Stones: no Plan: Walk-in Clinic: no Appointment w/Physician: [no Lab visit scheduled for urine drop off: Yes Advice given:  Do you take on daily medications for UTI suppression No

## 2023-06-28 ENCOUNTER — Encounter

## 2023-06-28 ENCOUNTER — Encounter: Payer: Self-pay | Admitting: Urology

## 2023-06-28 NOTE — Patient Instructions (Signed)

## 2023-06-29 ENCOUNTER — Other Ambulatory Visit: Payer: Self-pay | Admitting: Family Medicine

## 2023-06-29 MED ORDER — TRUE METRIX METER W/DEVICE KIT: PACK | 0 refills | Status: AC

## 2023-07-05 ENCOUNTER — Other Ambulatory Visit: Payer: Self-pay | Admitting: Nurse Practitioner

## 2023-07-05 DIAGNOSIS — L989 Disorder of the skin and subcutaneous tissue, unspecified: Secondary | ICD-10-CM

## 2023-07-05 MED ORDER — MUPIROCIN CALCIUM 2 % EX CREA: TOPICAL_CREAM | Freq: Two times a day (BID) | CUTANEOUS | 0 refills | Status: AC

## 2023-07-05 NOTE — Telephone Encounter (Signed)
 Copied from CRM [id number]. Topic: Clinical - Medication Refill >> Jul 05, 2023  3:39 PM Blair Bumpers wrote: Most Recent Primary Care Visit:   Medication: mupirocin  cream (BACTROBAN ) 2 %  Has the patient contacted their pharmacy? Yes, pharmacy advised patient that rx has expired and needs a new called in.  (Agent: If no, request that the patient contact the pharmacy for the refill. If patient does not wish to contact the pharmacy document the reason why and proceed with request.) (Agent: If yes, when and what did the pharmacy advise?)  Is this the correct pharmacy for this prescription? Yes If no, delete pharmacy and type the correct one.  This is the patient's preferred pharmacy:   [id number] - [place], [place] - [address] [place] [address] Phone: [phone] Fax: [phone]     Has the prescription been filled recently? Yes  Is the patient out of the medication? Yes  Has the patient been seen for an appointment in the last year OR does the patient have an upcoming appointment? Yes  Can we respond through MyChart? Yes  Agent: Please be advised that Rx refills may take up to 3 business days. We ask that you follow-up with your pharmacy.

## 2023-07-07 NOTE — Telephone Encounter (Signed)
 Ultram  is a contolled med- NTBS for pain management in order to change dose

## 2023-07-14 ENCOUNTER — Telehealth: Payer: Self-pay

## 2023-07-14 NOTE — Telephone Encounter (Signed)
 Return called to patient. Patient state's that she thought Dr. Claretta Croft told her to stop taking her Farxiga  while taking Vesicare  but she unsure and would like clarification. Patient is aware a message will be sent to the provider on advisement. Voiced understanding

## 2023-07-15 DIAGNOSIS — R300 Dysuria: Secondary | ICD-10-CM | POA: Diagnosis not present

## 2023-07-15 DIAGNOSIS — N3001 Acute cystitis with hematuria: Secondary | ICD-10-CM | POA: Diagnosis not present

## 2023-07-15 DIAGNOSIS — J329 Chronic sinusitis, unspecified: Secondary | ICD-10-CM | POA: Diagnosis not present

## 2023-07-15 DIAGNOSIS — B3731 Acute candidiasis of vulva and vagina: Secondary | ICD-10-CM | POA: Diagnosis not present

## 2023-07-19 NOTE — Telephone Encounter (Signed)
 Patient is made aware and voiced understanding. Per Dr. Claretta Croft "She needs to continue the farxiga "

## 2023-07-29 ENCOUNTER — Encounter: Payer: Self-pay | Admitting: Nurse Practitioner

## 2023-07-29 DIAGNOSIS — Z1231 Encounter for screening mammogram for malignant neoplasm of breast: Secondary | ICD-10-CM

## 2023-08-01 ENCOUNTER — Telehealth: Payer: Self-pay | Admitting: Pharmacist

## 2023-08-01 ENCOUNTER — Telehealth: Payer: Self-pay | Admitting: Nurse Practitioner

## 2023-08-01 DIAGNOSIS — N6320 Unspecified lump in the left breast, unspecified quadrant: Secondary | ICD-10-CM

## 2023-08-01 NOTE — Telephone Encounter (Signed)
 Spoke to patient is has noticed a knot in her left breast that has been there since last mammogram, but is now painful. Orders placed and patient aware that she will be contact by [facility] to schedule appt.

## 2023-08-01 NOTE — Telephone Encounter (Signed)
 Copied from CRM [id number]. Topic: Referral - Request for Referral >> Jul 29, 2023  4:47 PM Tiffany H wrote: Did the patient discuss referral with their provider in the last year? No (If No - schedule appointment) (If Yes - send message)  Appointment offered? No  Type of order/referral and detailed reason for visit: MM DIAG BREAST TOMO BILATERAL  Preference of office, provider, location: [facility], [address]. [place].   If referral order, have you been seen by this specialty before? Yes (If Yes, this issue or another issue? When? Where?  Can we respond through MyChart? Yes

## 2023-08-01 NOTE — Telephone Encounter (Signed)
 Patient was identified as falling into the [place] Measure - Diabetes.   Patient was: Appointment already scheduled for:  09/15/23. Last PCP visit was 06/20/23 and A1C was 8.5%, above goal <7%. Tresiba  was increased from 90 units to 100 units BID and she was restarted on Ozempic . Outreached patient to review medications and need for medication titration, but she did not answer. Left VM for her to return my call at her convenience.   Georga Killings, PharmD PGY-1 Pharmacy Resident

## 2023-08-08 ENCOUNTER — Ambulatory Visit: Admitting: Urology

## 2023-08-10 ENCOUNTER — Telehealth: Payer: Self-pay

## 2023-08-10 NOTE — Telephone Encounter (Signed)
 Alexandra Hunt received through patient assistance - called and left message to notify patient to pick up. Medication is in the fridge in the lab

## 2023-08-12 NOTE — Telephone Encounter (Signed)
 Left detailed message making patient aware that we still have her medication that needs to be picked up asap.

## 2023-08-20 ENCOUNTER — Other Ambulatory Visit: Payer: Self-pay | Admitting: Nurse Practitioner

## 2023-08-29 ENCOUNTER — Ambulatory Visit: Admitting: Internal Medicine

## 2023-09-06 ENCOUNTER — Ambulatory Visit: Admitting: Urology

## 2023-09-06 ENCOUNTER — Telehealth: Payer: Self-pay | Admitting: Pharmacist

## 2023-09-06 ENCOUNTER — Telehealth: Payer: Self-pay

## 2023-09-06 NOTE — Telephone Encounter (Signed)
 Copied from CRM [id number]. Topic: Clinical - Prescription Issue >> Sep 06, 2023  3:29 PM Ivette P wrote: Reason for CRM: Pt called in because no has called her since 08/31/2023 about her insulin  medication and was advised someone would call her back.   Novolin insulin     Mitzie will callback after confirming with pharmacy, pt will be expecting call

## 2023-09-06 NOTE — Telephone Encounter (Signed)
 Patient spoke with pharmacist in office today and was advised to contact patient assistance pharmacy

## 2023-09-06 NOTE — Telephone Encounter (Signed)
 Patient calling in saying someone from [facility] called and said she had PAP meds ready for pick up.  Can you confirm when next shipment will be?  She is on 2 insulins and Ozempic  I don't see any recent notes Highly encourage patient to call novo nordisk to follow her own medications She may need re-enrollment

## 2023-09-06 NOTE — Telephone Encounter (Signed)
 Patient called stating that someone called her a week or so ago telling her that she had medicine here that needed to be picked up. Patient got her son to come by the office last week to pick up her medicine and told the son that patient does not have any medicine here.  Patient wants to know what is going on. I spoke with Julie and she is going to reach out to [place] about this and have Lavern give patient a call with an update.

## 2023-09-07 DIAGNOSIS — E1165 Type 2 diabetes mellitus with hyperglycemia: Secondary | ICD-10-CM | POA: Diagnosis not present

## 2023-09-08 ENCOUNTER — Encounter: Payer: Self-pay | Admitting: Pharmacist

## 2023-09-08 NOTE — Telephone Encounter (Signed)
 My chart message sent to patient with updated info

## 2023-09-09 ENCOUNTER — Encounter

## 2023-09-09 ENCOUNTER — Other Ambulatory Visit

## 2023-09-13 ENCOUNTER — Telehealth: Payer: Self-pay

## 2023-09-13 NOTE — Telephone Encounter (Signed)
 Copied from CRM [id number]. Topic: Clinical - Medical Advice >> Sep 13, 2023 12:51 PM Harlene ORN wrote: Reason for CRM: was disconnected from a ken  insulin  was supposed to be at the doctor's office Please call back patient to dicsuss

## 2023-09-13 NOTE — Telephone Encounter (Signed)
 Called and left detailed message on patients voicemail explaining message that pharmacist sent on Mychart with an update on patient assistance and when meds will be delivered. Advised patient to contact the office if she needs further assistance

## 2023-09-15 ENCOUNTER — Encounter: Payer: Self-pay | Admitting: Nurse Practitioner

## 2023-09-15 ENCOUNTER — Ambulatory Visit: Admitting: Nurse Practitioner

## 2023-09-15 VITALS — BP 128/73 | HR 82 | Temp 96.6°F | Ht 64.0 in | Wt 197.0 lb

## 2023-09-15 DIAGNOSIS — E1142 Type 2 diabetes mellitus with diabetic polyneuropathy: Secondary | ICD-10-CM | POA: Diagnosis not present

## 2023-09-15 DIAGNOSIS — F411 Generalized anxiety disorder: Secondary | ICD-10-CM | POA: Diagnosis not present

## 2023-09-15 DIAGNOSIS — R7989 Other specified abnormal findings of blood chemistry: Secondary | ICD-10-CM

## 2023-09-15 DIAGNOSIS — E785 Hyperlipidemia, unspecified: Secondary | ICD-10-CM | POA: Diagnosis not present

## 2023-09-15 DIAGNOSIS — M8588 Other specified disorders of bone density and structure, other site: Secondary | ICD-10-CM

## 2023-09-15 DIAGNOSIS — E1165 Type 2 diabetes mellitus with hyperglycemia: Secondary | ICD-10-CM | POA: Diagnosis not present

## 2023-09-15 DIAGNOSIS — N393 Stress incontinence (female) (male): Secondary | ICD-10-CM

## 2023-09-15 DIAGNOSIS — I10 Essential (primary) hypertension: Secondary | ICD-10-CM | POA: Diagnosis not present

## 2023-09-15 DIAGNOSIS — Z6832 Body mass index (BMI) 32.0-32.9, adult: Secondary | ICD-10-CM

## 2023-09-15 DIAGNOSIS — E1169 Type 2 diabetes mellitus with other specified complication: Secondary | ICD-10-CM | POA: Diagnosis not present

## 2023-09-15 DIAGNOSIS — F339 Major depressive disorder, recurrent, unspecified: Secondary | ICD-10-CM | POA: Diagnosis not present

## 2023-09-15 LAB — BAYER DCA HB A1C WAIVED: HB A1C (BAYER DCA - WAIVED): 7.9 % — ABNORMAL HIGH (ref 4.8–5.6)

## 2023-09-15 MED ORDER — GABAPENTIN 100 MG PO CAPS: ORAL_CAPSULE | ORAL | 1 refills | Status: DC

## 2023-09-15 MED ORDER — FUROSEMIDE 20 MG PO TABS: ORAL_TABLET | ORAL | 1 refills | Status: DC

## 2023-09-15 MED ORDER — FENOFIBRATE 160 MG PO TABS: 160.0000 mg | ORAL_TABLET | Freq: Every day | ORAL | 1 refills | Status: DC

## 2023-09-15 MED ORDER — LISINOPRIL 20 MG PO TABS: 20.0000 mg | ORAL_TABLET | Freq: Every day | ORAL | 1 refills | Status: DC

## 2023-09-15 MED ORDER — INSULIN DEGLUDEC 100 UNIT/ML ~~LOC~~ SOLN: 100.0000 [IU] | Freq: Two times a day (BID) | SUBCUTANEOUS | Status: DC

## 2023-09-15 MED ORDER — LORAZEPAM 1 MG PO TABS: 1.0000 mg | ORAL_TABLET | Freq: Three times a day (TID) | ORAL | 2 refills | Status: DC

## 2023-09-15 MED ORDER — DAPAGLIFLOZIN PROPANEDIOL 5 MG PO TABS: 5.0000 mg | ORAL_TABLET | Freq: Every day | ORAL | 1 refills | Status: DC

## 2023-09-15 MED ORDER — SERTRALINE HCL 100 MG PO TABS: 200.0000 mg | ORAL_TABLET | Freq: Every day | ORAL | 1 refills | Status: DC

## 2023-09-15 NOTE — Patient Instructions (Signed)

## 2023-09-15 NOTE — Progress Notes (Signed)
 Subjective:    Patient ID: Alexandra Hunt, female    DOB: 07-12-1945, 78 y.o.   MRN: [id number]   Chief Complaint: medical management of chronic issues     HPI:  Alexandra Hunt is a 78 y.o. who identifies as a female who was assigned female at birth.   Social history: Lives with: kids and grandkids Work history: retired Scientist, clinical (histocompatibility and immunogenetics) in today for follow up of the following chronic medical issues:  1. Essential hypertension, malignant No c/o chest pain, sob or headache. Does not check blood pressure at home very often. When she does check it it is in the 130's systolic. BP Readings from Last 3 Encounters:  06/22/23 (!) 150/62  06/20/23 115/68  03/03/23 (!) 140/60      2. Hyperlipidemia associated with type 2 diabetes mellitus (HCC) Does not watch diet and does no dedicated exercise. Lab Results  Component Value Date   CHOL 134 06/20/2023   HDL 39 (L) 06/20/2023   LDLCALC 66 06/20/2023   TRIG 169 (H) 06/20/2023   CHOLHDL 3.4 06/20/2023     3. Uncontrolled type 2 diabetes mellitus with hyperglycemia (HCC) Fasting blood sugars are running around 90's-200 most of the time. She has ran out of ozempic  and pharmacy has not sent it to her.  Lab Results  Component Value Date   HGBA1C 8.5 (H) 06/20/2023     4. Diabetic polyneuropathy associated with type 2 diabetes mellitus (HCC) Has numbness and tingling in bil feet all the time.   5. Episode of recurrent major depressive disorder, unspecified depression episode severity (HCC) Is on zoloft  daily and is doing well.    09/15/2023    2:23 PM 06/20/2023    3:14 PM 02/18/2023    3:23 PM  Depression screen PHQ 2/9  Decreased Interest 1 2 2   Down, Depressed, Hopeless 2 1 1   PHQ - 2 Score 3 3 3   Altered sleeping 3 3 3   Tired, decreased energy 2 3 1   Change in appetite 2 2 1   Feeling bad or failure about yourself  1 1 1   Trouble concentrating 3 0 0  Moving slowly or fidgety/restless 0 0 0  Suicidal thoughts 0 0 0   PHQ-9 Score 14 12 9   Difficult doing work/chores Somewhat difficult Somewhat difficult Not difficult at all       6. GAD (generalized anxiety disorder) Stays anxious. Her living situation makes her nervous all the time.    09/15/2023    2:24 PM 06/20/2023    3:15 PM 01/31/2023    3:15 PM 11/11/2022    3:28 PM  GAD 7 : Generalized Anxiety Score  Nervous, Anxious, on Edge 1 1 1  0  Control/stop worrying 3 2 2  0  Worry too much - different things 2 2 2  0  Trouble relaxing 2 2 1  0  Restless 0 1 0 0  Easily annoyed or irritable 1 1 0 0  Afraid - awful might happen 0 1 1 0  Total GAD 7 Score 9 10 7  0  Anxiety Difficulty Somewhat difficult Somewhat difficult Somewhat difficult Not difficult at all    7. Low serum vitamin D  Is on daily vitamin d  supplement when she remembers to take  8. Stress incontinence of urine Is on myrbetriq  which is working well for her  9. Osteopenia of lumbar spine Last dexascan was done 07/22/22. T score was -1.7  10. BMI 32.0-32.9,adult Weight is down 3lbs  Wt Readings from Last  3 Encounters:  09/15/23 197 lb (89.4 kg)  06/20/23 200 lb (90.7 kg)  03/03/23 203 lb (92.1 kg)   BMI Readings from Last 3 Encounters:  09/15/23 33.81 kg/m  06/20/23 34.33 kg/m  03/03/23 34.84 kg/m       New complaints: None today  Allergies  Allergen Reactions   Penicillins     SOB,rash   Ivp Dye [Iodinated Contrast Media]     Burning at IV site   Statins     myalgias   Codeine     nauseated   Latex     rash   Sulfa Antibiotics     nauseated   Outpatient Encounter Medications as of 09/15/2023  Medication Sig   albuterol  (VENTOLIN  HFA) 108 (90 Base) MCG/ACT inhaler Inhale 2 puffs into the lungs every 6 (six) hours as needed for wheezing or shortness of breath.   Alcohol  Swabs (B-D SINGLE USE SWABS REGULAR) PADS TEST BS UP TO FOUR TIMES DAILY Dx E11.9   ALPHAGAN  P 0.1 % SOLN APPLY 1 DROP TO EYE 2 TIMES DAILY.   aspirin 81 MG tablet Take 81 mg by mouth  daily.   B-D UF III MINI PEN NEEDLES 31G X 5 MM MISC USE WITH SLIDING SCALE HUMALOG 4 TIMES DAILY DX E11.9   Blood Glucose Calibration (TRUE METRIX LEVEL 1) Low SOLN Use with glucose machine Dx E11.9,   Blood Glucose Monitoring Suppl (TRUE METRIX METER) w/Device KIT TEST BS UP TO FOUR TIMES DAILY Dx E11.9   Continuous Blood Gluc Receiver (FREESTYLE LIBRE 2 READER) DEVI Use to test blood sugar 4-6 times daily as directed DX: E11.9   [facility] LUBRICANT EYE DROPS 0.6 % SOLN Apply 1 drop to eye 2 (two) times daily.   dapagliflozin  propanediol (FARXIGA ) 5 MG TABS tablet Take 1 tablet (5 mg total) by mouth daily.   diclofenac (VOLTAREN) 75 MG EC tablet TAKE 1 TABLET BY MOUTH TWICE A DAY AS NEEDED   fenofibrate  160 MG tablet Take 1 tablet (160 mg total) by mouth daily.   fluticasone  (FLONASE ) 50 MCG/ACT nasal spray SPRAY 2 SPRAYS INTO EACH NOSTRIL EVERY DAY   furosemide  (LASIX ) 20 MG tablet TAKE 1 TABLET BY MOUTH EVERY DAY   gabapentin  (NEURONTIN ) 100 MG capsule Take 1 capsule (100mg ) by mouth in the morning, 1 capsule (100mg ) in the afternoon, & 2 capsules (200mg ) in the evening   glucose blood (TRUE METRIX BLOOD GLUCOSE TEST) test strip TEST BS UP TO FOUR TIMES DAILY Dx E11.9   ibuprofen  (ADVIL ) 800 MG tablet TAKE 1 TABLET BY MOUTH EVERY 8 HOURS AS NEEDED   Insulin  Degludec (TRESIBA ) 100 UNIT/ML SOLN Inject 100 Units into the skin 2 (two) times daily.   insulin  glargine (LANTUS  SOLOSTAR) 100 UNIT/ML Solostar Pen Inject 90 Units into the skin 2 (two) times daily.   insulin  regular (NOVOLIN R) 100 units/mL injection INJECT 22UNITS 4 TIMES A DAY PER SLIDING SCALE   Insulin  Syringe-Needle U-100 (BD INSULIN  SYRINGE U/F) 31G X 5/16 1 ML MISC UAD w/ Lantus  and Humalog insulin  sliding scale Dx E11.9   Lancet Devices (TRUEDRAW LANCING DEVICE) MISC TEST BS UP TO FOUR TIMES DAILY Dx E11.9   lisinopril  (ZESTRIL ) 20 MG tablet Take 1 tablet (20 mg total) by mouth daily.   LORazepam  (ATIVAN ) 1 MG tablet Take 1 tablet (1  mg total) by mouth 3 (three) times daily.   meclizine  (ANTIVERT ) 25 MG tablet Take 1 tablet (25 mg total) by mouth 3 (three) times daily as needed  for dizziness.   mupirocin  cream (BACTROBAN ) 2 % Apply topically 2 (two) times daily.   ondansetron  (ZOFRAN ) 4 MG tablet    Semaglutide ,0.25 or 0.5MG /DOS, (OZEMPIC , 0.25 OR 0.5 MG/DOSE,) 2 MG/3ML SOPN Inject 0.5 mg into the skin once a week.   sertraline  (ZOLOFT ) 100 MG tablet Take 2 tablets (200 mg total) by mouth daily.   solifenacin  (VESICARE ) 10 MG tablet Take 1 tablet (10 mg total) by mouth daily.   TRUEplus Lancets 33G MISC TEST BS UP TO FOUR TIMES DAILY Dx E11.9   Vitamin D , Ergocalciferol , (DRISDOL ) 1.25 MG (50000 UNIT) CAPS capsule TAKE 1 CAPSULE EVERY 7 DAYS   No facility-administered encounter medications on file as of 09/15/2023.    Past Surgical History:  Procedure Laterality Date   ABDOMINAL HYSTERECTOMY     due to uterine cancer   BREAST SURGERY     left breast biopsy/benign   TUBAL LIGATION      Family History  Problem Relation Age of Onset   Diabetes Mother    Blindness Mother        related to diabetes   Stroke Mother    Heart disease Father        MI   Heart attack Father    Heart defect Father    Breast cancer Sister    Diabetes Brother        diet controlled   Colon cancer Neg Hx    Esophageal cancer Neg Hx    Rectal cancer Neg Hx    Stomach cancer Neg Hx       Controlled substance contract: n/a     Review of Systems  Constitutional:  Negative for diaphoresis.  Eyes:  Negative for pain.  Respiratory:  Negative for shortness of breath.   Cardiovascular:  Negative for chest pain, palpitations and leg swelling.  Gastrointestinal:  Negative for abdominal pain.  Endocrine: Negative for polydipsia.  Skin:  Negative for rash.  Neurological:  Negative for dizziness, weakness and headaches.  Hematological:  Does not bruise/bleed easily.  All other systems reviewed and are negative.      Objective:    Physical Exam Vitals and nursing note reviewed.  Constitutional:      General: She is not in acute distress.    Appearance: Normal appearance. She is well-developed.  HENT:     Head: Normocephalic.     Right Ear: Tympanic membrane normal.     Left Ear: Tympanic membrane normal.     Nose: Nose normal.     Mouth/Throat:     Mouth: Mucous membranes are moist.  Eyes:     Pupils: Pupils are equal, round, and reactive to light.  Neck:     Vascular: No carotid bruit or JVD.  Cardiovascular:     Rate and Rhythm: Normal rate and regular rhythm.     Heart sounds: Normal heart sounds.  Pulmonary:     Effort: Pulmonary effort is normal. No respiratory distress.     Breath sounds: Normal breath sounds. No wheezing or rales.  Chest:     Chest wall: No tenderness.  Abdominal:     General: Bowel sounds are normal. There is no distension or abdominal bruit.     Palpations: Abdomen is soft. There is no hepatomegaly, splenomegaly, mass or pulsatile mass.     Tenderness: There is no abdominal tenderness.  Musculoskeletal:        General: Normal range of motion.     Cervical back: Normal range of motion and  neck supple.  Lymphadenopathy:     Cervical: No cervical adenopathy.  Skin:    General: Skin is warm and dry.  Neurological:     Mental Status: She is alert and oriented to person, place, and time.     Deep Tendon Reflexes: Reflexes are normal and symmetric.  Psychiatric:        Behavior: Behavior normal.        Thought Content: Thought content normal.        Judgment: Judgment normal.     BP 128/73   Pulse 82   Temp (!) 96.6 F (35.9 C) (Temporal)   Ht 5' 4 (1.626 m)   Wt 197 lb (89.4 kg)   SpO2 95%   BMI 33.81 kg/m    HGBa1c 7.9%      Assessment & Plan:   CLYDA SMYTH comes in today with chief complaint of medical management of chronic issues    Diagnosis and orders addressed:  1. Essential hypertension, malignant Low sodium diet - CBC with  Differential/Platelet - CMP14+EGFR  2. Hyperlipidemia associated with type 2 diabetes mellitus (HCC) Low fat diet - Lipid panel  3. Uncontrolled type 2 diabetes mellitus with hyperglycemia (HCC) Get back on ozempic  - Bayer DCA Hb A1c Waived - Microalbumin / creatinine urine ratio - Semaglutide ,0.25 or 0.5MG /DOS, (OZEMPIC , 0.25 OR 0.5 MG/DOSE,) 2 MG/3ML SOPN; Inject 0.5 mg into the skin once a week.  Dispense: 9 mL; Refill: 0  4. Diabetic polyneuropathy associated with type 2 diabetes mellitus (HCC) Check feet daily  5. Episode of recurrent major depressive disorder, unspecified depression episode severity (HCC) Stress management  6. GAD (generalized anxiety disorder) Stress management - LORazepam  (ATIVAN ) 1 MG tablet; Take 1 tablet (1 mg total) by mouth 3 (three) times daily.  Dispense: 90 tablet; Refill: 2  7. Low serum vitamin D  Vitamin d  supplement  8. Stress incontinence of urine  9. Osteopenia of lumbar spine Weight bearing exercise  10. BMI 32.0-32.9,adult Discussed diet and exercise for person with BMI >25 Will recheck weight in 3-6 months    Labs pending Health Maintenance reviewed Diet and exercise encouraged  Follow up plan: 3 months   Mary-Margaret Gladis, FNP

## 2023-09-16 ENCOUNTER — Ambulatory Visit: Payer: Self-pay | Admitting: Nurse Practitioner

## 2023-09-16 ENCOUNTER — Ambulatory Visit
Admission: RE | Admit: 2023-09-16 | Discharge: 2023-09-16 | Disposition: A | Discharge status: Home / Self Care | Source: Ambulatory Visit | Attending: Nurse Practitioner | Admitting: Nurse Practitioner

## 2023-09-16 DIAGNOSIS — N6325 Unspecified lump in the left breast, overlapping quadrants: Secondary | ICD-10-CM | POA: Diagnosis not present

## 2023-09-16 DIAGNOSIS — N6320 Unspecified lump in the left breast, unspecified quadrant: Secondary | ICD-10-CM

## 2023-09-16 LAB — LIPID PANEL
Chol/HDL Ratio: 4.1 ratio (ref 0.0–4.4)
Cholesterol, Total: 139 mg/dL (ref 100–199)
HDL: 34 mg/dL — ABNORMAL LOW (ref >39)
LDL Chol Calc (NIH): 80 mg/dL (ref 0–99)
Triglycerides: 141 mg/dL (ref 0–149)
VLDL Cholesterol Cal: 25 mg/dL (ref 5–40)

## 2023-09-16 LAB — CMP14+EGFR
ALT: 11 IU/L (ref 0–32)
AST: 16 IU/L (ref 0–40)
Albumin: 4 g/dL (ref 3.8–4.8)
Alkaline Phosphatase: 64 IU/L (ref 44–121)
BUN/Creatinine Ratio: 20 (ref 12–28)
BUN: 25 mg/dL (ref 8–27)
Bilirubin Total: 0.2 mg/dL (ref 0.0–1.2)
CO2: 22 mmol/L (ref 20–29)
Calcium: 9.5 mg/dL (ref 8.7–10.3)
Chloride: 102 mmol/L (ref 96–106)
Creatinine, Ser: 1.23 mg/dL — ABNORMAL HIGH (ref 0.57–1.00)
Globulin, Total: 2.9 g/dL (ref 1.5–4.5)
Glucose: 161 mg/dL — ABNORMAL HIGH (ref 70–99)
Potassium: 4.6 mmol/L (ref 3.5–5.2)
Sodium: 139 mmol/L (ref 134–144)
Total Protein: 6.9 g/dL (ref 6.0–8.5)
eGFR: 46 mL/min/1.73 — ABNORMAL LOW (ref >59)

## 2023-09-16 LAB — CBC WITH DIFFERENTIAL/PLATELET
Basophils Absolute: 0.1 x10E3/uL (ref 0.0–0.2)
Basos: 1 %
EOS (ABSOLUTE): 0.1 x10E3/uL (ref 0.0–0.4)
Eos: 1 %
Hematocrit: 41.3 % (ref 34.0–46.6)
Hemoglobin: 13 g/dL (ref 11.1–15.9)
Immature Grans (Abs): 0.1 x10E3/uL (ref 0.0–0.1)
Immature Granulocytes: 1 %
Lymphocytes Absolute: 2.9 x10E3/uL (ref 0.7–3.1)
Lymphs: 22 %
MCH: 25.8 pg — ABNORMAL LOW (ref 26.6–33.0)
MCHC: 31.5 g/dL (ref 31.5–35.7)
MCV: 82 fL (ref 79–97)
Monocytes Absolute: 0.8 x10E3/uL (ref 0.1–0.9)
Monocytes: 6 %
Neutrophils Absolute: 9.3 x10E3/uL — ABNORMAL HIGH (ref 1.4–7.0)
Neutrophils: 69 %
Platelets: 350 x10E3/uL (ref 150–450)
RBC: 5.03 x10E6/uL (ref 3.77–5.28)
RDW: 14.1 % (ref 11.7–15.4)
WBC: 13.3 x10E3/uL — ABNORMAL HIGH (ref 3.4–10.8)

## 2023-09-19 DIAGNOSIS — Z4789 Encounter for other orthopedic aftercare: Secondary | ICD-10-CM | POA: Diagnosis not present

## 2023-09-19 DIAGNOSIS — M65332 Trigger finger, left middle finger: Secondary | ICD-10-CM | POA: Diagnosis not present

## 2023-09-19 DIAGNOSIS — M79642 Pain in left hand: Secondary | ICD-10-CM | POA: Diagnosis not present

## 2023-09-19 DIAGNOSIS — G5603 Carpal tunnel syndrome, bilateral upper limbs: Secondary | ICD-10-CM | POA: Diagnosis not present

## 2023-09-19 DIAGNOSIS — G5622 Lesion of ulnar nerve, left upper limb: Secondary | ICD-10-CM | POA: Diagnosis not present

## 2023-09-26 ENCOUNTER — Ambulatory Visit: Payer: Self-pay | Admitting: Nurse Practitioner

## 2023-09-29 ENCOUNTER — Ambulatory Visit: Admitting: Urology

## 2023-09-30 ENCOUNTER — Telehealth: Payer: Self-pay

## 2023-10-25 ENCOUNTER — Ambulatory Visit: Admitting: Urology

## 2023-10-26 ENCOUNTER — Telehealth: Payer: Self-pay

## 2023-10-26 NOTE — Telephone Encounter (Signed)
 Copied from CRM [id number]. Topic: Clinical - Order For Equipment >> Oct 25, 2023  4:29 PM Alexandra Hunt wrote: Reason for CRM: Patient states her CPAP machine is not working, would like to know if she should take it back rom where she picked it up from. Requesting Hunt call back.  Callback number: [phone]   VM/LM x1   (Please contact company - [employer])

## 2023-11-03 ENCOUNTER — Ambulatory Visit: Admitting: Internal Medicine

## 2023-11-07 ENCOUNTER — Encounter: Payer: Self-pay | Admitting: *Deleted

## 2023-11-08 ENCOUNTER — Telehealth: Payer: Self-pay

## 2023-11-08 NOTE — Telephone Encounter (Signed)
 Copied from CRM #[id number]. Topic: General - Other >> Nov 08, 2023  9:57 AM Avram MATSU wrote: Reason for CRM: Gladis is calling to confirm if patient has any of these following conditions: diabetes,chronic heart failure, cardio vascular diseases.   Please advise [phone]  Reference number [id number]

## 2023-11-12 DIAGNOSIS — J0100 Acute maxillary sinusitis, unspecified: Secondary | ICD-10-CM | POA: Diagnosis not present

## 2023-11-12 DIAGNOSIS — J029 Acute pharyngitis, unspecified: Secondary | ICD-10-CM | POA: Diagnosis not present

## 2023-11-12 DIAGNOSIS — L89119 Pressure ulcer of right upper back, unspecified stage: Secondary | ICD-10-CM | POA: Diagnosis not present

## 2023-11-12 DIAGNOSIS — H1032 Unspecified acute conjunctivitis, left eye: Secondary | ICD-10-CM | POA: Diagnosis not present

## 2023-11-21 ENCOUNTER — Ambulatory Visit: Admitting: Urology

## 2023-11-21 ENCOUNTER — Telehealth: Payer: Self-pay

## 2023-11-29 ENCOUNTER — Telehealth: Payer: Self-pay

## 2023-11-29 NOTE — Telephone Encounter (Signed)
   In process of completing Novo Nordisk refills for patients OZEMPIC  medication.  Application emailed to JULIE P for signature.

## 2023-11-30 DIAGNOSIS — E113293 Type 2 diabetes mellitus with mild nonproliferative diabetic retinopathy without macular edema, bilateral: Secondary | ICD-10-CM | POA: Diagnosis not present

## 2023-11-30 DIAGNOSIS — G514 Facial myokymia: Secondary | ICD-10-CM | POA: Diagnosis not present

## 2023-11-30 DIAGNOSIS — H40023 Open angle with borderline findings, high risk, bilateral: Secondary | ICD-10-CM | POA: Diagnosis not present

## 2023-11-30 DIAGNOSIS — H353132 Nonexudative age-related macular degeneration, bilateral, intermediate dry stage: Secondary | ICD-10-CM | POA: Diagnosis not present

## 2023-11-30 LAB — OPHTHALMOLOGY REPORT-SCANNED

## 2023-12-02 ENCOUNTER — Ambulatory Visit: Payer: Self-pay

## 2023-12-02 ENCOUNTER — Encounter

## 2023-12-02 NOTE — Telephone Encounter (Signed)
 Called patient to get more information. Patient stated that she was placed on an antibiotic last week, she has since finished the antibiotic but now has oral thrush. I informed patient that the antibiotic can most certainly cause oral thrush and there is nothing she can do at home to make it go away. I informed patient that she would need to go to urgent care. Patient verbalized understanding and agreed to recommendation.

## 2023-12-02 NOTE — Telephone Encounter (Signed)
 Source  Alexandra Hunt (Patient)   Subject  Alexandra Hunt (Patient)   Topic  Clinical - Pink Word Triage    Communication  Reason for Triage: Patient was on antibiotics and now has thrush, unable to swallow, painful- [phone]

## 2023-12-02 NOTE — Telephone Encounter (Signed)
 FYI Only or Action Required?: Action required by provider: clinical question for provider.  Patient was last seen in primary care on 09/15/2023 by Gladis Mustard, FNP.  Called Nurse Triage reporting Mouth symptoms.  Symptoms began yesterday.  Interventions attempted: Nothing.  Symptoms are: gradually worsening.Finished antibiotics from UC, now believes she has thrush. Has white patches inside cheeks, tongue is sore, painful. Asking for medication. No transportation today for a visit.Please advise pt.  Triage Disposition: See PCP When Office is Open (Within 3 Days)  Patient/caregiver understands and will follow disposition?: No, wishes to speak with PCP    Reason for Disposition  [1] White patches that stick to tongue or inner cheek AND [2] can be wiped off  Answer Assessment - Initial Assessment Questions 1. SYMPTOM: What's the main symptom you're concerned about? (e.g., chapped lips, dry mouth, lump, sores)     White patches, tongue sore 2. ONSET: When did the    start?     4 days 3. PAIN: Is there any pain? If Yes, ask: How bad is it? (Scale: 0-10; or none, mild, moderate, severe)     severe 4. CAUSE: What do you think is causing the symptoms?     thrush 5. OTHER SYMPTOMS: Do you have any other symptoms? (e.g., fever, sore throat, toothache, swelling)     no 6. PREGNANCY: Is there any chance you are pregnant? When was your last menstrual period?     no  Protocols used: Mouth Symptoms-A-AH

## 2023-12-06 DIAGNOSIS — K121 Other forms of stomatitis: Secondary | ICD-10-CM | POA: Diagnosis not present

## 2023-12-06 DIAGNOSIS — J069 Acute upper respiratory infection, unspecified: Secondary | ICD-10-CM | POA: Diagnosis not present

## 2023-12-06 DIAGNOSIS — R070 Pain in throat: Secondary | ICD-10-CM | POA: Diagnosis not present

## 2023-12-07 DIAGNOSIS — R918 Other nonspecific abnormal finding of lung field: Secondary | ICD-10-CM | POA: Diagnosis not present

## 2023-12-14 NOTE — Telephone Encounter (Signed)
 Faxed completed refill form to [facility].   Ozempic  0.5mg  dose pens.

## 2023-12-15 ENCOUNTER — Encounter: Payer: Self-pay | Admitting: Nurse Practitioner

## 2023-12-15 ENCOUNTER — Ambulatory Visit: Admitting: Nurse Practitioner

## 2023-12-15 NOTE — Progress Notes (Deleted)
 Subjective:    Patient ID: Alexandra Hunt, female    DOB: 24-Jun-1945, 78 y.o.   MRN: [id number]   Chief Complaint: medical management of chronic issues     HPI:  Alexandra Hunt is a 78 y.o. who identifies as a female who was assigned female at birth.   Social history: Lives with: kids and grandkids Work history: retired Scientist, clinical (histocompatibility and immunogenetics) in today for follow up of the following chronic medical issues:  1. Essential hypertension, malignant No c/o chest pain, sob or headache. Does not check blood pressure at home very often. When she does check it it is in the 130's systolic. BP Readings from Last 3 Encounters:  09/15/23 128/73  06/22/23 (!) 150/62  06/20/23 115/68      2. Hyperlipidemia associated with type 2 diabetes mellitus (HCC) Does not watch diet and does no dedicated exercise. Lab Results  Component Value Date   CHOL 139 09/15/2023   HDL 34 (L) 09/15/2023   LDLCALC 80 09/15/2023   TRIG 141 09/15/2023   CHOLHDL 4.1 09/15/2023     3. Uncontrolled type 2 diabetes mellitus with hyperglycemia (HCC) Fasting blood sugars are running around 90's-200 most of the time. She has ran out of ozempic  and pharmacy has not sent it to her.  Lab Results  Component Value Date   HGBA1C 7.9 (H) 09/15/2023     4. Diabetic polyneuropathy associated with type 2 diabetes mellitus (HCC) Has numbness and tingling in bil feet all the time.   5. Episode of recurrent major depressive disorder, unspecified depression episode severity (HCC) Is on zoloft  daily and is doing well.    09/15/2023    2:23 PM 06/20/2023    3:14 PM 02/18/2023    3:23 PM  Depression screen PHQ 2/9  Decreased Interest 1 2 2   Down, Depressed, Hopeless 2 1 1   PHQ - 2 Score 3 3 3   Altered sleeping 3 3 3   Tired, decreased energy 2 3 1   Change in appetite 2 2 1   Feeling bad or failure about yourself  1 1 1   Trouble concentrating 3 0 0  Moving slowly or fidgety/restless 0 0 0  Suicidal thoughts 0 0 0  PHQ-9 Score  14 12 9   Difficult doing work/chores Somewhat difficult Somewhat difficult Not difficult at all       6. GAD (generalized anxiety disorder) Stays anxious. Her living situation makes her nervous all the time.    09/15/2023    2:24 PM 06/20/2023    3:15 PM 01/31/2023    3:15 PM 11/11/2022    3:28 PM  GAD 7 : Generalized Anxiety Score  Nervous, Anxious, on Edge 1 1 1  0  Control/stop worrying 3 2 2  0  Worry too much - different things 2 2 2  0  Trouble relaxing 2 2 1  0  Restless 0 1 0 0  Easily annoyed or irritable 1 1 0 0  Afraid - awful might happen 0 1 1 0  Total GAD 7 Score 9 10 7  0  Anxiety Difficulty Somewhat difficult Somewhat difficult Somewhat difficult Not difficult at all    7. Low serum vitamin D  Is on daily vitamin d  supplement when she remembers to take  8. Stress incontinence of urine Is on myrbetriq  which is working well for her  9. Osteopenia of lumbar spine Last dexascan was done 07/22/22. T score was -1.7  10. BMI 32.0-32.9,adult Weight is down 3lbs  Wt Readings from Last 3 Encounters:  09/15/23 197 lb (89.4 kg)  06/20/23 200 lb (90.7 kg)  03/03/23 203 lb (92.1 kg)   BMI Readings from Last 3 Encounters:  09/15/23 33.81 kg/m  06/20/23 34.33 kg/m  03/03/23 34.84 kg/m       New complaints: None today  Allergies  Allergen Reactions   Penicillins     SOB,rash   Ivp Dye [Iodinated Contrast Media]     Burning at IV site   Statins     myalgias   Codeine     nauseated   Latex     rash   Sulfa Antibiotics     nauseated   Outpatient Encounter Medications as of 12/15/2023  Medication Sig   albuterol  (VENTOLIN  HFA) 108 (90 Base) MCG/ACT inhaler Inhale 2 puffs into the lungs every 6 (six) hours as needed for wheezing or shortness of breath.   Alcohol  Swabs (B-D SINGLE USE SWABS REGULAR) PADS TEST BS UP TO FOUR TIMES DAILY Dx E11.9   ALPHAGAN  P 0.1 % SOLN APPLY 1 DROP TO EYE 2 TIMES DAILY.   aspirin 81 MG tablet Take 81 mg by mouth daily.    B-D UF III MINI PEN NEEDLES 31G X 5 MM MISC USE WITH SLIDING SCALE HUMALOG 4 TIMES DAILY DX E11.9   Blood Glucose Calibration (TRUE METRIX LEVEL 1) Low SOLN Use with glucose machine Dx E11.9,   Blood Glucose Monitoring Suppl (TRUE METRIX METER) w/Device KIT TEST BS UP TO FOUR TIMES DAILY Dx E11.9   Continuous Blood Gluc Receiver (FREESTYLE LIBRE 2 READER) DEVI Use to test blood sugar 4-6 times daily as directed DX: E11.9   [employer] LUBRICANT EYE DROPS 0.6 % SOLN Apply 1 drop to eye 2 (two) times daily.   dapagliflozin  propanediol (FARXIGA ) 5 MG TABS tablet Take 1 tablet (5 mg total) by mouth daily.   diclofenac (VOLTAREN) 75 MG EC tablet TAKE 1 TABLET BY MOUTH TWICE A DAY AS NEEDED   fenofibrate  160 MG tablet Take 1 tablet (160 mg total) by mouth daily.   fluticasone  (FLONASE ) 50 MCG/ACT nasal spray SPRAY 2 SPRAYS INTO EACH NOSTRIL EVERY DAY   furosemide  (LASIX ) 20 MG tablet TAKE 1 TABLET BY MOUTH EVERY DAY   gabapentin  (NEURONTIN ) 100 MG capsule Take 1 capsule (100mg ) by mouth in the morning, 1 capsule (100mg ) in the afternoon, & 2 capsules (200mg ) in the evening   glucose blood (TRUE METRIX BLOOD GLUCOSE TEST) test strip TEST BS UP TO FOUR TIMES DAILY Dx E11.9   ibuprofen  (ADVIL ) 800 MG tablet TAKE 1 TABLET BY MOUTH EVERY 8 HOURS AS NEEDED   Insulin  Degludec (TRESIBA ) 100 UNIT/ML SOLN Inject 100 Units into the skin 2 (two) times daily.   insulin  glargine (LANTUS  SOLOSTAR) 100 UNIT/ML Solostar Pen Inject 90 Units into the skin 2 (two) times daily.   insulin  regular (NOVOLIN R) 100 units/mL injection INJECT 22UNITS 4 TIMES A DAY PER SLIDING SCALE   Insulin  Syringe-Needle U-100 (BD INSULIN  SYRINGE U/F) 31G X 5/16 1 ML MISC UAD w/ Lantus  and Humalog insulin  sliding scale Dx E11.9   Lancet Devices (TRUEDRAW LANCING DEVICE) MISC TEST BS UP TO FOUR TIMES DAILY Dx E11.9   lisinopril  (ZESTRIL ) 20 MG tablet Take 1 tablet (20 mg total) by mouth daily.   LORazepam  (ATIVAN ) 1 MG tablet Take 1 tablet (1 mg total)  by mouth 3 (three) times daily.   meclizine  (ANTIVERT ) 25 MG tablet Take 1 tablet (25 mg total) by mouth 3 (three) times daily as needed for dizziness.  mupirocin  cream (BACTROBAN ) 2 % Apply topically 2 (two) times daily.   ondansetron  (ZOFRAN ) 4 MG tablet    Semaglutide ,0.25 or 0.5MG /DOS, (OZEMPIC , 0.25 OR 0.5 MG/DOSE,) 2 MG/3ML SOPN Inject 0.5 mg into the skin once a week.   sertraline  (ZOLOFT ) 100 MG tablet Take 2 tablets (200 mg total) by mouth daily.   solifenacin  (VESICARE ) 10 MG tablet Take 1 tablet (10 mg total) by mouth daily.   TRUEplus Lancets 33G MISC TEST BS UP TO FOUR TIMES DAILY Dx E11.9   Vitamin D , Ergocalciferol , (DRISDOL ) 1.25 MG (50000 UNIT) CAPS capsule TAKE 1 CAPSULE EVERY 7 DAYS   No facility-administered encounter medications on file as of 12/15/2023.    Past Surgical History:  Procedure Laterality Date   ABDOMINAL HYSTERECTOMY     due to uterine cancer   BREAST SURGERY     left breast biopsy/benign   TUBAL LIGATION      Family History  Problem Relation Age of Onset   Diabetes Mother    Blindness Mother        related to diabetes   Stroke Mother    Heart disease Father        MI   Heart attack Father    Heart defect Father    Breast cancer Sister    Diabetes Brother        diet controlled   Colon cancer Neg Hx    Esophageal cancer Neg Hx    Rectal cancer Neg Hx    Stomach cancer Neg Hx       Controlled substance contract: n/a     Review of Systems  Constitutional:  Negative for diaphoresis.  Eyes:  Negative for pain.  Respiratory:  Negative for shortness of breath.   Cardiovascular:  Negative for chest pain, palpitations and leg swelling.  Gastrointestinal:  Negative for abdominal pain.  Endocrine: Negative for polydipsia.  Skin:  Negative for rash.  Neurological:  Negative for dizziness, weakness and headaches.  Hematological:  Does not bruise/bleed easily.  All other systems reviewed and are negative.      Objective:   Physical  Exam Vitals and nursing note reviewed.  Constitutional:      General: She is not in acute distress.    Appearance: Normal appearance. She is well-developed.  HENT:     Head: Normocephalic.     Right Ear: Tympanic membrane normal.     Left Ear: Tympanic membrane normal.     Nose: Nose normal.     Mouth/Throat:     Mouth: Mucous membranes are moist.  Eyes:     Pupils: Pupils are equal, round, and reactive to light.  Neck:     Vascular: No carotid bruit or JVD.  Cardiovascular:     Rate and Rhythm: Normal rate and regular rhythm.     Heart sounds: Normal heart sounds.  Pulmonary:     Effort: Pulmonary effort is normal. No respiratory distress.     Breath sounds: Normal breath sounds. No wheezing or rales.  Chest:     Chest wall: No tenderness.  Abdominal:     General: Bowel sounds are normal. There is no distension or abdominal bruit.     Palpations: Abdomen is soft. There is no hepatomegaly, splenomegaly, mass or pulsatile mass.     Tenderness: There is no abdominal tenderness.  Musculoskeletal:        General: Normal range of motion.     Cervical back: Normal range of motion and neck supple.  Lymphadenopathy:  Cervical: No cervical adenopathy.  Skin:    General: Skin is warm and dry.  Neurological:     Mental Status: She is alert and oriented to person, place, and time.     Deep Tendon Reflexes: Reflexes are normal and symmetric.  Psychiatric:        Behavior: Behavior normal.        Thought Content: Thought content normal.        Judgment: Judgment normal.     ***    HGBa1c 7.9%      Assessment & Plan:   Alexandra Hunt comes in today with chief complaint of medical management of chronic issues    Diagnosis and orders addressed:  1. Essential hypertension, malignant Low sodium diet - CBC with Differential/Platelet - CMP14+EGFR  2. Hyperlipidemia associated with type 2 diabetes mellitus (HCC) Low fat diet - Lipid panel  3. Uncontrolled type 2  diabetes mellitus with hyperglycemia (HCC) Get back on ozempic  - Bayer DCA Hb A1c Waived - Microalbumin / creatinine urine ratio - Semaglutide ,0.25 or 0.5MG /DOS, (OZEMPIC , 0.25 OR 0.5 MG/DOSE,) 2 MG/3ML SOPN; Inject 0.5 mg into the skin once a week.  Dispense: 9 mL; Refill: 0  4. Diabetic polyneuropathy associated with type 2 diabetes mellitus (HCC) Check feet daily  5. Episode of recurrent major depressive disorder, unspecified depression episode severity (HCC) Stress management  6. GAD (generalized anxiety disorder) Stress management - LORazepam  (ATIVAN ) 1 MG tablet; Take 1 tablet (1 mg total) by mouth 3 (three) times daily.  Dispense: 90 tablet; Refill: 2  7. Low serum vitamin D  Vitamin d  supplement  8. Stress incontinence of urine  9. Osteopenia of lumbar spine Weight bearing exercise  10. BMI 32.0-32.9,adult Discussed diet and exercise for person with BMI >25 Will recheck weight in 3-6 months    Labs pending Health Maintenance reviewed Diet and exercise encouraged  Follow up plan: 3 months   Mary-Margaret Gladis, FNP

## 2023-12-16 ENCOUNTER — Telehealth: Payer: Self-pay

## 2023-12-16 ENCOUNTER — Ambulatory Visit: Admitting: Nurse Practitioner

## 2023-12-16 NOTE — Telephone Encounter (Signed)
 Copied from CRM [id number]. Topic: General - Other >> Dec 16, 2023 10:13 AM Graeme ORN wrote: Reason for CRM:  Ronnika called from [facility] [phone]. Sent form twice -- last time 10/15 - says successful. Need new rx for freestyle [place] completed and sent back. Thank You

## 2023-12-16 NOTE — Telephone Encounter (Signed)
 Fax was received on 12/12/23, received back from PCP today and will be faxed to [facility] today. 2nd fax was also received today.

## 2023-12-19 ENCOUNTER — Encounter: Payer: Self-pay | Admitting: Nurse Practitioner

## 2023-12-19 ENCOUNTER — Ambulatory Visit (INDEPENDENT_AMBULATORY_CARE_PROVIDER_SITE_OTHER): Admitting: Nurse Practitioner

## 2023-12-19 VITALS — BP 120/57 | HR 86 | Temp 96.7°F | Ht 64.0 in | Wt 195.0 lb

## 2023-12-19 DIAGNOSIS — I10 Essential (primary) hypertension: Secondary | ICD-10-CM | POA: Diagnosis not present

## 2023-12-19 DIAGNOSIS — M8588 Other specified disorders of bone density and structure, other site: Secondary | ICD-10-CM | POA: Diagnosis not present

## 2023-12-19 DIAGNOSIS — N393 Stress incontinence (female) (male): Secondary | ICD-10-CM

## 2023-12-19 DIAGNOSIS — R200 Anesthesia of skin: Secondary | ICD-10-CM

## 2023-12-19 DIAGNOSIS — E785 Hyperlipidemia, unspecified: Secondary | ICD-10-CM

## 2023-12-19 DIAGNOSIS — F411 Generalized anxiety disorder: Secondary | ICD-10-CM

## 2023-12-19 DIAGNOSIS — Z7985 Long-term (current) use of injectable non-insulin antidiabetic drugs: Secondary | ICD-10-CM

## 2023-12-19 DIAGNOSIS — F339 Major depressive disorder, recurrent, unspecified: Secondary | ICD-10-CM | POA: Diagnosis not present

## 2023-12-19 DIAGNOSIS — E1142 Type 2 diabetes mellitus with diabetic polyneuropathy: Secondary | ICD-10-CM

## 2023-12-19 DIAGNOSIS — R202 Paresthesia of skin: Secondary | ICD-10-CM

## 2023-12-19 DIAGNOSIS — E1169 Type 2 diabetes mellitus with other specified complication: Secondary | ICD-10-CM | POA: Diagnosis not present

## 2023-12-19 DIAGNOSIS — Z6832 Body mass index (BMI) 32.0-32.9, adult: Secondary | ICD-10-CM

## 2023-12-19 DIAGNOSIS — E1165 Type 2 diabetes mellitus with hyperglycemia: Secondary | ICD-10-CM | POA: Diagnosis not present

## 2023-12-19 DIAGNOSIS — R7989 Other specified abnormal findings of blood chemistry: Secondary | ICD-10-CM

## 2023-12-19 LAB — BAYER DCA HB A1C WAIVED: HB A1C (BAYER DCA - WAIVED): 8.9 % — ABNORMAL HIGH (ref 4.8–5.6)

## 2023-12-19 MED ORDER — LISINOPRIL 20 MG PO TABS: 20.0000 mg | ORAL_TABLET | Freq: Every day | ORAL | 1 refills | Status: AC

## 2023-12-19 MED ORDER — LORAZEPAM 1 MG PO TABS: 1.0000 mg | ORAL_TABLET | Freq: Three times a day (TID) | ORAL | 2 refills | Status: AC

## 2023-12-19 MED ORDER — INSULIN DEGLUDEC 100 UNIT/ML ~~LOC~~ SOLN: 100.0000 [IU] | Freq: Two times a day (BID) | SUBCUTANEOUS | 5 refills | Status: DC

## 2023-12-19 MED ORDER — DAPAGLIFLOZIN PROPANEDIOL 5 MG PO TABS: 5.0000 mg | ORAL_TABLET | Freq: Every day | ORAL | 1 refills | Status: AC

## 2023-12-19 MED ORDER — SERTRALINE HCL 100 MG PO TABS: 200.0000 mg | ORAL_TABLET | Freq: Every day | ORAL | 1 refills | Status: AC

## 2023-12-19 MED ORDER — FREESTYLE LIBRE 2 SENSOR MISC: 1.0000 | 1 refills | Status: DC

## 2023-12-19 MED ORDER — GABAPENTIN 100 MG PO CAPS: ORAL_CAPSULE | ORAL | 1 refills | Status: AC

## 2023-12-19 MED ORDER — FUROSEMIDE 20 MG PO TABS: ORAL_TABLET | ORAL | 1 refills | Status: AC

## 2023-12-19 MED ORDER — FENOFIBRATE 160 MG PO TABS: 160.0000 mg | ORAL_TABLET | Freq: Every day | ORAL | 1 refills | Status: AC

## 2023-12-19 NOTE — Patient Instructions (Signed)

## 2023-12-19 NOTE — Progress Notes (Signed)
 Subjective:    Patient ID: Alexandra Hunt, female    DOB: [birth year], 78 y.o.   MRN: [id number]   Chief Complaint: medical management of chronic issues     HPI:  Alexandra Hunt is a 78 y.o. who identifies as a female who was assigned female at birth.   Social history: Lives with: kids and grandkids Work history: retired Scientist, clinical (histocompatibility and immunogenetics) in today for follow up of the following chronic medical issues:  1. Essential hypertension, malignant No c/o chest pain, sob or headache. Does not check blood pressure at home very often. When she does check it it is in the 130's systolic. BP Readings from Last 3 Encounters:  09/15/23 128/73  06/22/23 (!) 150/62  06/20/23 115/68      2. Hyperlipidemia associated with type 2 diabetes mellitus (HCC) Does not watch diet and does no dedicated exercise. Lab Results  Component Value Date   CHOL 139 09/15/2023   HDL 34 (L) 09/15/2023   LDLCALC 80 09/15/2023   TRIG 141 09/15/2023   CHOLHDL 4.1 09/15/2023     3. Uncontrolled type 2 diabetes mellitus with hyperglycemia (HCC) Fasting blood sugars are running around 90's-200 most of the time. She has had bronchitis and her blood sugars have been running high. Not watching diet at all. Uses CGM and checks blood sugar 4x a day. Not compliant with her meds. Has  missed several doses of meds. Lab Results  Component Value Date   HGBA1C 7.9 (H) 09/15/2023     4. Diabetic polyneuropathy associated with type 2 diabetes mellitus (HCC) Has numbness and tingling in bil feet all the time.   5. Episode of recurrent major depressive disorder, unspecified depression episode severity (HCC) Is on zoloft  daily and is doing well.     12/19/2023    2:48 PM 09/15/2023    2:23 PM 06/20/2023    3:14 PM  Depression screen PHQ 2/9  Decreased Interest 1 1 2   Down, Depressed, Hopeless 1 2 1   PHQ - 2 Score 2 3 3   Altered sleeping 2 3 3   Tired, decreased energy 2 2 3   Change in appetite 2 2 2   Feeling bad or  failure about yourself  2 1 1   Trouble concentrating 0 3 0  Moving slowly or fidgety/restless 0 0 0  Suicidal thoughts 0 0 0  PHQ-9 Score 10 14 12   Difficult doing work/chores Somewhat difficult Somewhat difficult Somewhat difficult       6. GAD (generalized anxiety disorder) Stays anxious. Her living situation makes her nervous all the time.    12/19/2023    2:49 PM 09/15/2023    2:24 PM 06/20/2023    3:15 PM 01/31/2023    3:15 PM  GAD 7 : Generalized Anxiety Score  Nervous, Anxious, on Edge 1 1 1 1   Control/stop worrying 2 3 2 2   Worry too much - different things 2 2 2 2   Trouble relaxing 2 2 2 1   Restless 1 0 1 0  Easily annoyed or irritable 1 1 1  0  Afraid - awful might happen 1 0 1 1  Total GAD 7 Score 10 9 10 7   Anxiety Difficulty Somewhat difficult Somewhat difficult Somewhat difficult Somewhat difficult      7. Low serum vitamin D  Is on daily vitamin d  supplement when she remembers to take  8. Stress incontinence of urine Is on myrbetriq  which is working well for her  9. Osteopenia of lumbar spine Last dexascan  was done 07/22/22. T score was -1.7  10. BMI 32.0-32.9,adult Weight is down 3lbs  Wt Readings from Last 3 Encounters:  09/15/23 197 lb (89.4 kg)  06/20/23 200 lb (90.7 kg)  03/03/23 203 lb (92.1 kg)   BMI Readings from Last 3 Encounters:  09/15/23 33.81 kg/m  06/20/23 34.33 kg/m  03/03/23 34.84 kg/m       New complaints: Cough since 12/06/23. Dx with bronchitis. Was given antibiotic but no steroids. She is no better.  Allergies  Allergen Reactions   Penicillins     SOB,rash   Ivp Dye [Iodinated Contrast Media]     Burning at IV site   Statins     myalgias   Codeine     nauseated   Latex     rash   Sulfa Antibiotics     nauseated   Outpatient Encounter Medications as of 12/19/2023  Medication Sig   albuterol  (VENTOLIN  HFA) 108 (90 Base) MCG/ACT inhaler Inhale 2 puffs into the lungs every 6 (six) hours as needed for wheezing or  shortness of breath.   Alcohol  Swabs (B-D SINGLE USE SWABS REGULAR) PADS TEST BS UP TO FOUR TIMES DAILY Dx E11.9   ALPHAGAN  P 0.1 % SOLN APPLY 1 DROP TO EYE 2 TIMES DAILY.   aspirin 81 MG tablet Take 81 mg by mouth daily.   B-D UF III MINI PEN NEEDLES 31G X 5 MM MISC USE WITH SLIDING SCALE HUMALOG 4 TIMES DAILY DX E11.9   Blood Glucose Calibration (TRUE METRIX LEVEL 1) Low SOLN Use with glucose machine Dx E11.9,   Blood Glucose Monitoring Suppl (TRUE METRIX METER) w/Device KIT TEST BS UP TO FOUR TIMES DAILY Dx E11.9   Continuous Blood Gluc Receiver (FREESTYLE LIBRE 2 READER) DEVI Use to test blood sugar 4-6 times daily as directed DX: E11.9   CVS LUBRICANT EYE DROPS 0.6 % SOLN Apply 1 drop to eye 2 (two) times daily.   dapagliflozin  propanediol (FARXIGA ) 5 MG TABS tablet Take 1 tablet (5 mg total) by mouth daily.   diclofenac (VOLTAREN) 75 MG EC tablet TAKE 1 TABLET BY MOUTH TWICE A DAY AS NEEDED   fenofibrate  160 MG tablet Take 1 tablet (160 mg total) by mouth daily.   fluticasone  (FLONASE ) 50 MCG/ACT nasal spray SPRAY 2 SPRAYS INTO EACH NOSTRIL EVERY DAY   furosemide  (LASIX ) 20 MG tablet TAKE 1 TABLET BY MOUTH EVERY DAY   gabapentin  (NEURONTIN ) 100 MG capsule Take 1 capsule (100mg ) by mouth in the morning, 1 capsule (100mg ) in the afternoon, & 2 capsules (200mg ) in the evening   glucose blood (TRUE METRIX BLOOD GLUCOSE TEST) test strip TEST BS UP TO FOUR TIMES DAILY Dx E11.9   ibuprofen  (ADVIL ) 800 MG tablet TAKE 1 TABLET BY MOUTH EVERY 8 HOURS AS NEEDED   Insulin  Degludec (TRESIBA ) 100 UNIT/ML SOLN Inject 100 Units into the skin 2 (two) times daily.   insulin  glargine (LANTUS  SOLOSTAR) 100 UNIT/ML Solostar Pen Inject 90 Units into the skin 2 (two) times daily.   insulin  regular (NOVOLIN R) 100 units/mL injection INJECT 22UNITS 4 TIMES A DAY PER SLIDING SCALE   Insulin  Syringe-Needle U-100 (BD INSULIN  SYRINGE U/F) 31G X 5/16 1 ML MISC UAD w/ Lantus  and Humalog insulin  sliding scale Dx E11.9    Lancet Devices (TRUEDRAW LANCING DEVICE) MISC TEST BS UP TO FOUR TIMES DAILY Dx E11.9   lisinopril  (ZESTRIL ) 20 MG tablet Take 1 tablet (20 mg total) by mouth daily.   LORazepam  (ATIVAN ) 1 MG  tablet Take 1 tablet (1 mg total) by mouth 3 (three) times daily.   meclizine  (ANTIVERT ) 25 MG tablet Take 1 tablet (25 mg total) by mouth 3 (three) times daily as needed for dizziness.   mupirocin  cream (BACTROBAN ) 2 % Apply topically 2 (two) times daily.   ondansetron  (ZOFRAN ) 4 MG tablet    Semaglutide ,0.25 or 0.5MG /DOS, (OZEMPIC , 0.25 OR 0.5 MG/DOSE,) 2 MG/3ML SOPN Inject 0.5 mg into the skin once a week.   sertraline  (ZOLOFT ) 100 MG tablet Take 2 tablets (200 mg total) by mouth daily.   solifenacin  (VESICARE ) 10 MG tablet Take 1 tablet (10 mg total) by mouth daily.   TRUEplus Lancets 33G MISC TEST BS UP TO FOUR TIMES DAILY Dx E11.9   Vitamin D , Ergocalciferol , (DRISDOL ) 1.25 MG (50000 UNIT) CAPS capsule TAKE 1 CAPSULE EVERY 7 DAYS   No facility-administered encounter medications on file as of 12/19/2023.    Past Surgical History:  Procedure Laterality Date   ABDOMINAL HYSTERECTOMY     due to uterine cancer   BREAST SURGERY     left breast biopsy/benign   TUBAL LIGATION      Family History  Problem Relation Age of Onset   Diabetes Mother    Blindness Mother        related to diabetes   Stroke Mother    Heart disease Father        MI   Heart attack Father    Heart defect Father    Breast cancer Sister    Diabetes Brother        diet controlled   Colon cancer Neg Hx    Esophageal cancer Neg Hx    Rectal cancer Neg Hx    Stomach cancer Neg Hx       Controlled substance contract: n/a     Review of Systems  Constitutional:  Negative for diaphoresis.  Eyes:  Negative for pain.  Respiratory:  Negative for shortness of breath.   Cardiovascular:  Negative for chest pain, palpitations and leg swelling.  Gastrointestinal:  Negative for abdominal pain.  Endocrine: Negative for  polydipsia.  Skin:  Negative for rash.  Neurological:  Negative for dizziness, weakness and headaches.  Hematological:  Does not bruise/bleed easily.  All other systems reviewed and are negative.      Objective:   Physical Exam Vitals and nursing note reviewed.  Constitutional:      General: She is not in acute distress.    Appearance: Normal appearance. She is well-developed.  HENT:     Head: Normocephalic.     Right Ear: Tympanic membrane normal.     Left Ear: Tympanic membrane normal.     Nose: Nose normal.     Mouth/Throat:     Mouth: Mucous membranes are moist.  Eyes:     Pupils: Pupils are equal, round, and reactive to light.  Neck:     Vascular: No carotid bruit or JVD.  Cardiovascular:     Rate and Rhythm: Normal rate and regular rhythm.     Heart sounds: Normal heart sounds.  Pulmonary:     Effort: Pulmonary effort is normal. No respiratory distress.     Breath sounds: Normal breath sounds. No wheezing or rales.  Chest:     Chest wall: No tenderness.  Abdominal:     General: Bowel sounds are normal. There is no distension or abdominal bruit.     Palpations: Abdomen is soft. There is no hepatomegaly, splenomegaly, mass or pulsatile mass.  Tenderness: There is no abdominal tenderness.  Musculoskeletal:        General: Normal range of motion.     Cervical back: Normal range of motion and neck supple.  Lymphadenopathy:     Cervical: No cervical adenopathy.  Skin:    General: Skin is warm and dry.  Neurological:     Mental Status: She is alert and oriented to person, place, and time.     Deep Tendon Reflexes: Reflexes are normal and symmetric.  Psychiatric:        Behavior: Behavior normal.        Thought Content: Thought content normal.        Judgment: Judgment normal.     BP (!) 120/57   Pulse 86   Temp (!) 96.7 F (35.9 C) (Temporal)   Ht 5' 4 (1.626 m)   Wt 195 lb (88.5 kg)   SpO2 97%   BMI 33.47 kg/m      HGBa1c 8.9%      Assessment  & Plan:   Alexandra Hunt comes in today with chief complaint of medical management of chronic issues    Diagnosis and orders addressed:  1. Essential hypertension, malignant Low sodium diet - CBC with Differential/Platelet - CMP14+EGFR  2. Hyperlipidemia associated with type 2 diabetes mellitus (HCC) Low fat diet - Lipid panel  3. Uncontrolled type 2 diabetes mellitus with hyperglycemia (HCC) Get back on ozempic  - Bayer DCA Hb A1c Waived - Microalbumin / creatinine urine ratio - Semaglutide ,0.25 or 0.5MG /DOS, (OZEMPIC , 0.25 OR 0.5 MG/DOSE,) 2 MG/3ML SOPN; Inject 0.5 mg into the skin once a week.  Dispense: 9 mL; Refill: 0  4. Diabetic polyneuropathy associated with type 2 diabetes mellitus (HCC) Check feet daily  5. Episode of recurrent major depressive disorder, unspecified depression episode severity (HCC) Stress management  6. GAD (generalized anxiety disorder) Stress management - LORazepam  (ATIVAN ) 1 MG tablet; Take 1 tablet (1 mg total) by mouth 3 (three) times daily.  Dispense: 90 tablet; Refill: 2  7. Low serum vitamin D  Vitamin d  supplement  8. Stress incontinence of urine  9. Osteopenia of lumbar spine Weight bearing exercise  10. BMI 32.0-32.9,adult Discussed diet and exercise for person with BMI >25 Will recheck weight in 3-6 months    Labs pending Health Maintenance reviewed Diet and exercise encouraged  Follow up plan: 3 months   Mary-Margaret Gladis, FNP

## 2023-12-20 ENCOUNTER — Ambulatory Visit: Payer: Self-pay | Admitting: Nurse Practitioner

## 2023-12-20 ENCOUNTER — Telehealth: Payer: Self-pay

## 2023-12-20 LAB — LIPID PANEL

## 2023-12-20 NOTE — Telephone Encounter (Signed)
 Patient seen in office yesterday and states that she needs refills on her patient assistance meds. It looks like ozempic  is in process but patient also requesting Novolin and Tresiba . Please advise

## 2023-12-21 LAB — LIPID PANEL
Cholesterol, Total: 140 mg/dL (ref 100–199)
HDL: 33 mg/dL — AB (ref >39)
LDL CALC COMMENT:: 4.2 ratio (ref 0.0–4.4)
LDL Chol Calc (NIH): 75 mg/dL (ref 0–99)
Triglycerides: 190 mg/dL — AB (ref 0–149)
VLDL Cholesterol Cal: 32 mg/dL (ref 5–40)

## 2023-12-21 LAB — CBC WITH DIFFERENTIAL/PLATELET
Basophils Absolute: 0.1 x10E3/uL (ref 0.0–0.2)
Basos: 0 %
EOS (ABSOLUTE): 0.1 x10E3/uL (ref 0.0–0.4)
Eos: 1 %
Hematocrit: 43.4 % (ref 34.0–46.6)
Hemoglobin: 13.3 g/dL (ref 11.1–15.9)
Immature Grans (Abs): 0 x10E3/uL (ref 0.0–0.1)
Immature Granulocytes: 0 %
Lymphocytes Absolute: 3.6 x10E3/uL — ABNORMAL HIGH (ref 0.7–3.1)
Lymphs: 30 %
MCH: 25.5 pg — ABNORMAL LOW (ref 26.6–33.0)
MCHC: 30.6 g/dL — ABNORMAL LOW (ref 31.5–35.7)
MCV: 83 fL (ref 79–97)
Monocytes Absolute: 0.6 x10E3/uL (ref 0.1–0.9)
Monocytes: 5 %
Neutrophils Absolute: 7.6 x10E3/uL — ABNORMAL HIGH (ref 1.4–7.0)
Neutrophils: 64 %
Platelets: 327 x10E3/uL (ref 150–450)
RBC: 5.21 x10E6/uL (ref 3.77–5.28)
RDW: 13.4 % (ref 11.7–15.4)
WBC: 12 x10E3/uL — ABNORMAL HIGH (ref 3.4–10.8)

## 2023-12-21 LAB — CMP14+EGFR
ALT: 13 IU/L (ref 0–32)
AST: 22 IU/L (ref 0–40)
Albumin: 4.1 g/dL (ref 3.8–4.8)
Alkaline Phosphatase: 76 IU/L (ref 49–135)
BUN/Creatinine Ratio: 24 (ref 12–28)
BUN: 28 mg/dL — AB (ref 8–27)
Bilirubin Total: <0.2 mg/dL (ref 0.0–1.2)
CO2: 20 mmol/L (ref 20–29)
Calcium: 9.5 mg/dL (ref 8.7–10.3)
Chloride: 102 mmol/L (ref 96–106)
Creatinine, Ser: 1.16 mg/dL — AB (ref 0.57–1.00)
Globulin, Total: 2.9 g/dL (ref 1.5–4.5)
Glucose: 131 mg/dL — AB (ref 70–99)
Potassium: 5.1 mmol/L (ref 3.5–5.2)
Sodium: 142 mmol/L (ref 134–144)
Total Protein: 7 g/dL (ref 6.0–8.5)
eGFR: 49 mL/min/1.73 — AB (ref >59)

## 2023-12-22 ENCOUNTER — Other Ambulatory Visit (HOSPITAL_COMMUNITY): Payer: Self-pay

## 2023-12-22 NOTE — Telephone Encounter (Signed)
 Left message requesting call back [phone]) to discuss Novo Nordisk changes for 2026, and also gave companies direct line to follow up on her shipment [phone])

## 2023-12-25 ENCOUNTER — Other Ambulatory Visit: Payer: Self-pay | Admitting: Nurse Practitioner

## 2023-12-25 DIAGNOSIS — E1165 Type 2 diabetes mellitus with hyperglycemia: Secondary | ICD-10-CM

## 2023-12-27 ENCOUNTER — Telehealth: Payer: Self-pay | Admitting: Pharmacist

## 2023-12-27 DIAGNOSIS — E1165 Type 2 diabetes mellitus with hyperglycemia: Secondary | ICD-10-CM

## 2023-12-27 MED ORDER — TRESIBA FLEXTOUCH 200 UNIT/ML ~~LOC~~ SOPN: 90.0000 [IU] | PEN_INJECTOR | Freq: Two times a day (BID) | SUBCUTANEOUS | 5 refills | Status: AC

## 2023-12-27 NOTE — Telephone Encounter (Signed)
 Changed to U200 patient using 90 units twice daily No insulin  R (was having hypos)--need to redo insulin  regimen Ozempic  placed in fridge for pick up Pharmd appt next week for f/u  Mliss Tarry Griffin, PharmD, BCACP, CPP Clinical Pharmacist, [facility]

## 2023-12-28 NOTE — Progress Notes (Signed)
 HPI F never smoker, retired engineer, civil (consulting) followed for OSA, complicated by HTN, Chronic Rhinitis, Asthma, DM2, Recurrent Otitis Media, Eustachian Dysfunction, Endometrial Cancer, Hyperlipidemia, Anxiety, Glaucoma, Psoriasis, HST 12/19/22- AHI 13/hr, desat to 84%, body weight 198 lbs  ============================================================================  03/03/23-  [name] never smoker, retired engineer, civil (consulting) followed for [employer],  complicated by HTN, Chronic Rhinitis, DM2, Recurrent Otitis Media, Eustachian Dysfunction, Endometrial Cancer, Hyperlipidemia, Anxiety, Glaucoma, Psoriasis, -Albuterol  hfa,  HST 12/19/22- AHI 13/hr, desat to 84%, body weight 198 lbs Body weight today-203 lbs For treatment decision Discussed the use of AI scribe software for clinical note transcription with the patient, who gave verbal consent to proceed.  History of Present Illness   The patient, with a history of sleep apnea, presents for a follow-up after a recent home sleep study. She reports daytime tiredness and snoring, and the sleep study showed that she stops breathing about thirteen times an hour. She has previously used CPAP therapy, but has been off it for a long time. She expresses interest in resuming CPAP therapy, but also inquires about the possibility of a fitted mouthpiece as an alternative. However, she is currently undergoing dental work, including the surgical removal of a broken tooth and the fitting of a bridge, which may not be compatible with the mouthpiece. She also mentions a heart murmur, which was not detected during the current examination.    Assessment and Plan:    Obstructive Sleep Apnea Home sleep test shows mild range with 13 events per hour. Daytime tiredness and snoring reported. Discussed options of CPAP and dental appliance. Dental work pending and patient prefers CPAP. -Order auto-adjusting CPAP with pressure range 5-15. -Allow patient to choose mask type with home care company. -If issues  with mask fit or other CPAP concerns, patient to contact office.  Trace Heart Murmur No change noted on current examination. -No specific intervention required at this time.  General Health Maintenance / Followup Plans -Follow up as needed for any issues with CPAP use or other concerns.    12/29/23- [name] never smoker, retired engineer, civil (consulting),  with remote hx OSA/ CPAP broke, never replaced. Now self referred. Complicated by HTN, Chronic Rhinitis, DM2,  Recurrent Otitis Media, Eustachian Dysfunction, Endometrial Cancer, Hyperlipidemia, Anxiety, Glaucoma,  -Albuterol  hfa,  Body weight today-200 lbs CPAP auto 5-15/ [facility]   ordered 03/03/23 Download compliance- admits missing some nights Oct 8- Urgent Care then [facility] hosp with malaise, cough, CXR reported pneumonia. Treated doxycycline . Viral panel neg. Thrush.  Still lingering malaise and some cough, mostly dry. Agreed to try to clear this up with Zpak and Difucan.    ROS-see HPI   + = positive Constitutional:    weight loss, night sweats, fevers, chills, fatigue, lassitude. HEENT:    headaches, difficulty swallowing, tooth/dental problems, sore throat,       sneezing, itching, ear ache, nasal congestion, post nasal drip, snoring CV:    chest pain, orthopnea, PND, swelling in lower extremities, anasarca,                                   dizziness, palpitations Resp:   shortness of breath with exertion or at rest.                +productive cough,   +non-productive cough, coughing up of blood.              change in color of mucus.  wheezing.  Skin:    rash or lesions. GI:  No-   heartburn, indigestion, abdominal pain, nausea, vomiting, diarrhea,                 change in bowel habits, loss of appetite GU: dysuria, change in color of urine, no urgency or frequency.   flank pain. MS:   joint pain, stiffness, decreased range of motion, back pain. Neuro-     nothing unusual Psych:  change in mood or affect.  depression or anxiety.   memory  loss.  OBJ- Physical Exam General- Alert, Oriented, Affect-appropriate, Distress- none acute, +obese Skin- rash-none, lesions- none, excoriation- none Lymphadenopathy- none Head- atraumatic            Eyes- Gross vision intact, PERRLA, conjunctivae and secretions clear            Ears- Hearing, canals-normal            Nose- Clear, no-Septal dev, mucus, polyps, erosion, perforation             Throat- Mallampati III , mucosa clear , drainage- none, tonsils- atrophic, +teeth,  +mild thrush Neck- flexible , trachea midline, no stridor , thyroid  nl, carotid no bruit Chest - symmetrical excursion, unlabored           Heart/CV- RRR ,  murmur+1/6 S , no gallop  , no rub, nl s1 s2                           - JVD- none , edema- none, stasis changes- none, varices- none           Lung- clear to P&A, wheeze- none, cough- none , dullness-none, rub- none           Chest wall-  Abd-  Br/ Gen/ Rectal- Not done, not indicated Extrem-+rolling walker Neuro- grossly intact to observation

## 2023-12-29 ENCOUNTER — Encounter: Payer: Self-pay | Admitting: Internal Medicine

## 2023-12-29 ENCOUNTER — Ambulatory Visit (INDEPENDENT_AMBULATORY_CARE_PROVIDER_SITE_OTHER): Admitting: Internal Medicine

## 2023-12-29 VITALS — BP 122/70 | HR 88 | Temp 98.0°F | Ht 64.0 in | Wt 200.4 lb

## 2023-12-29 DIAGNOSIS — J189 Pneumonia, unspecified organism: Secondary | ICD-10-CM

## 2023-12-29 DIAGNOSIS — B370 Candidal stomatitis: Secondary | ICD-10-CM | POA: Diagnosis not present

## 2023-12-29 DIAGNOSIS — G4733 Obstructive sleep apnea (adult) (pediatric): Secondary | ICD-10-CM | POA: Diagnosis not present

## 2023-12-29 MED ORDER — AZITHROMYCIN 250 MG PO TABS: ORAL_TABLET | ORAL | 0 refills | Status: AC

## 2023-12-29 MED ORDER — FLUCONAZOLE 150 MG PO TABS: 150.0000 mg | ORAL_TABLET | Freq: Every day | ORAL | 1 refills | Status: AC

## 2023-12-29 NOTE — Patient Instructions (Signed)
 Script sent for Zpak to use if needed  Script sent for Diflucan - take one daily for 2 days- should help clear your mouth  Try to get back to using your CPAP

## 2023-12-30 ENCOUNTER — Encounter: Payer: Self-pay | Admitting: Neurology

## 2024-01-01 ENCOUNTER — Encounter: Payer: Self-pay | Admitting: Internal Medicine

## 2024-01-01 DIAGNOSIS — B370 Candidal stomatitis: Secondary | ICD-10-CM | POA: Insufficient documentation

## 2024-01-01 DIAGNOSIS — J189 Pneumonia, unspecified organism: Secondary | ICD-10-CM | POA: Insufficient documentation

## 2024-01-01 NOTE — Assessment & Plan Note (Signed)
 Zpak to completely clear.  CXR if symptoms persist.

## 2024-01-01 NOTE — Assessment & Plan Note (Signed)
 CPAP 5-15 [facility] Plan- emphasize compliance, with attention on follow-up

## 2024-01-01 NOTE — Assessment & Plan Note (Signed)
Plan-Diflucan 

## 2024-01-03 ENCOUNTER — Other Ambulatory Visit

## 2024-01-03 NOTE — Progress Notes (Signed)
 12/27/2023 Name: Alexandra Hunt MRN: [id number] DOB: Aug 29, 1945  Chief Complaint  Patient presents with   Diabetes    Alexandra Hunt is a 78 y.o. year old female who presented for a telephone visit.   They were referred to the pharmacist by their PCP for assistance in managing diabetes and medication access.    Subjective:  Care Team: Primary Care Provider: Gladis Mustard, FNP    Medication Access/Adherence  Current Pharmacy:  [phone] [place] - [address] [place] [address] Phone: [phone] Fax: [phone]  [facility] [place], [place] - [address] [place] [address] Phone: [phone] Fax: [phone]   Patient reports affordability concerns with their medications: Yes  Patient reports access/transportation concerns to their pharmacy: No  Patient reports adherence concerns with their medications:  No     Diabetes:  Current medications: Tresiba , insulin  R, Ozempic  (PAP), Farxiga  Medications tried in the past: Trulicity , Levemir , Basaglar , Touejo, metformin   Current glucose readings: patient unable to provide due to current sickness  Need to switch patient to [facility] 3 PLUS  Current meal patterns:  Discussed meal planning options and Plate method for healthy eating Avoid sugary drinks and desserts Incorporate balanced protein, non starchy veggies, 1 serving of carbohydrate with each meal Increase water intake Increase physical activity as able  Current medication access support: novo PAP--going away in 2026  Macrovascular and Microvascular Risk Reduction:  Statin? no; patient declined statin therapy; ACEi/ARB? yes (lisinopril ) Last urinary albumin/creatinine ratio:  Lab Results  Component Value Date   MICRALBCREAT 40 (H) 06/20/2023   MICRALBCREAT 12 12/28/2021   MICRALBCREAT 12 11/28/2020   MICRALBCREAT 28.5 11/09/2017   MICRALBCREAT 9.5 07/29/2016    MICRALBCREAT <6.4 03/14/2015   Last eye exam:  Lab Results  Component Value Date   HMDIABEYEEXA Retinopathy (A) 11/30/2023   Last foot exam: 09/15/2023 Tobacco Use:  Tobacco Use: Low Risk  (01/01/2024)   Patient History    Smoking Tobacco Use: Never    Smokeless Tobacco Use: Never    Passive Exposure: Never  Recent Concern: Tobacco Use - Medium Risk (12/06/2023)   Received from [facility]   Patient History    Smoking Tobacco Use: Former    Smokeless Tobacco Use: Never    Passive Exposure: Never     Objective:  Lab Results  Component Value Date   HGBA1C 8.9 (H) 12/19/2023    Lab Results  Component Value Date   CREATININE 1.16 (H) 12/19/2023   BUN 28 (H) 12/19/2023   NA 142 12/19/2023   K 5.1 12/19/2023   CL 102 12/19/2023   CO2 20 12/19/2023    Lab Results  Component Value Date   CHOL 140 12/19/2023   HDL 33 (L) 12/19/2023   LDLCALC 75 12/19/2023   TRIG 190 (H) 12/19/2023   CHOLHDL 4.2 12/19/2023    Medications Reviewed Today     Reviewed by Billee Mliss BIRCH, [facility] (Pharmacist) on 01/06/24 at 1028  Med List Status: <None>   Medication Order Taking? Sig Documenting Provider Last Dose Status Informant  albuterol  (VENTOLIN  HFA) 108 (90 Base) MCG/ACT inhaler [id number]  Inhale 2 puffs into the lungs every 6 (six) hours as needed for wheezing or shortness of breath. Neysa Rama D, MD  Active   Alcohol  Swabs (B-D SINGLE USE SWABS REGULAR) PADS [id number]  TEST BS UP TO FOUR TIMES DAILY Dx E11.9 Gladis Mustard, FNP  Active  ALPHAGAN  P 0.1 % SOLN [id number]  APPLY 1 DROP TO EYE 2 TIMES DAILY. Gladis, Mary-Margaret, FNP  Active   azithromycin  (ZITHROMAX ) 250 MG tablet [id number]  2 on first day, then one daily for infection Neysa Reggy BIRCH, MD  Active   B-D UF III MINI PEN NEEDLES 31G X 5 MM MISC [id number]  USE WITH SLIDING SCALE HUMALOG 4 TIMES DAILY DX E11.9 Gladis, Mary-Margaret, FNP  Active   Blood Glucose Calibration (TRUE METRIX LEVEL 1) Low SOLN  [id number]  Use with glucose machine Dx E11.9, Gladis, Mary-Margaret, FNP  Active   Blood Glucose Monitoring Suppl (TRUE METRIX METER) w/Device KIT [id number]  TEST BS UP TO FOUR TIMES DAILY Dx E11.9 Gladis Mustard, FNP  Active   Continuous Blood Gluc Receiver (FREESTYLE LIBRE 2 READER) DEVI [id number]  Use to test blood sugar 4-6 times daily as directed DX: E11.9 Gladis Mustard, FNP  Active   Continuous Glucose Sensor (FREESTYLE LIBRE 2 SENSOR) [place] [id number]  1 each by Does not apply route every 14 (fourteen) days. Gladis, Mary-Margaret, FNP  Active   [facility] LUBRICANT EYE DROPS 0.6 % SOLN [id number]  Apply 1 drop to eye 2 (two) times daily. [provider]  Active   dapagliflozin  propanediol (FARXIGA ) 5 MG TABS tablet [id number]  Take 1 tablet (5 mg total) by mouth daily. Gladis, Mary-Margaret, FNP  Active   diclofenac (VOLTAREN) 75 MG EC tablet [id number]  TAKE 1 TABLET BY MOUTH TWICE A DAY AS NEEDED Gladis, Mary-Margaret, FNP  Active   fenofibrate  160 MG tablet [id number]  Take 1 tablet (160 mg total) by mouth daily. Gladis, Mary-Margaret, FNP  Active   fluconazole  (DIFLUCAN ) 150 MG tablet [id number]  Take 1 tablet (150 mg total) by mouth daily. Neysa Reggy D, MD  Active   fluticasone  (FLONASE ) 50 MCG/ACT nasal spray [id number]  SPRAY 2 SPRAYS INTO EACH NOSTRIL EVERY DAY Gladis, Mary-Margaret, FNP  Active   furosemide  (LASIX ) 20 MG tablet [id number]  TAKE 1 TABLET BY MOUTH EVERY DAY Gladis, Mary-Margaret, FNP  Active   gabapentin  (NEURONTIN ) 100 MG capsule [id number]  Take 1 capsule (100mg ) by mouth in the morning, 1 capsule (100mg ) in the afternoon, & 2 capsules (200mg ) in the evening Gladis, Mary-Margaret, FNP  Active   glucose blood (TRUE METRIX BLOOD GLUCOSE TEST) test strip [id number]  TEST BS UP TO FOUR TIMES DAILY Dx E11.9 Gladis, Mary-Margaret, FNP  Active   ibuprofen  (ADVIL ) 800 MG tablet [id number]  TAKE 1 TABLET BY MOUTH EVERY 8 HOURS AS NEEDED Gladis, Mary-Margaret, FNP  Active    insulin  degludec (TRESIBA  FLEXTOUCH) 200 UNIT/ML FlexTouch Pen [id number]  Inject 90 Units into the skin 2 (two) times daily. Gladis, Mary-Margaret, FNP  Active   insulin  regular (NOVOLIN R) 100 units/mL injection [id number]  INJECT 22UNITS 4 TIMES A DAY PER SLIDING SCALE Gladis, Mary-Margaret, FNP  Active   Insulin  Syringe-Needle U-100 (BD INSULIN  SYRINGE U/F) 31G X 5/16 1 ML MISC [id number]  UAD w/ Lantus  and Humalog insulin  sliding scale Dx E11.9 Gladis Mustard, FNP  Active   Lancet Devices (TRUEDRAW LANCING DEVICE) MISC [id number]  TEST BS UP TO FOUR TIMES DAILY Dx E11.9 Gladis Mary-Margaret, FNP  Active   lisinopril  (ZESTRIL ) 20 MG tablet [id number]  Take 1 tablet (20 mg total) by mouth daily. Gladis, Mary-Margaret, FNP  Active   LORazepam  (ATIVAN ) 1 MG tablet [id number]  Take 1 tablet (1 mg total) by mouth 3 (three) times daily. Gladis, Mary-Margaret, FNP  Active   meclizine  (ANTIVERT ) 25 MG  tablet [id number]  Take 1 tablet (25 mg total) by mouth 3 (three) times daily as needed for dizziness. Gladis, Mary-Margaret, FNP  Active   mupirocin  cream (BACTROBAN ) 2 % [id number]  Apply topically 2 (two) times daily. Gladis, Mary-Margaret, FNP  Active   Semaglutide ,0.25 or 0.5MG /DOS, (OZEMPIC , 0.25 OR 0.5 MG/DOSE,) 2 MG/3ML SOPN [id number]  Inject 0.5 mg into the skin once a week. Gladis, Mary-Margaret, FNP  Active   sertraline  (ZOLOFT ) 100 MG tablet [id number]  Take 2 tablets (200 mg total) by mouth daily. Gladis, Mary-Margaret, FNP  Active   solifenacin  (VESICARE ) 10 MG tablet [id number]  Take 1 tablet (10 mg total) by mouth daily. McKenzie, Belvie CROME, MD  Active   TRUEplus Lancets 33G MISC [id number]  TEST BS UP TO FOUR TIMES DAILY Dx E11.9 Gladis, Mary-Margaret, FNP  Active   Vitamin D , Ergocalciferol , (DRISDOL ) 1.25 MG (50000 UNIT) CAPS capsule [id number]  TAKE 1 CAPSULE EVERY 7 DAYS Gladis, Mary-Margaret, FNP  Active               Assessment/Plan:   Diabetes: - Currently uncontrolled; goal  A1c <7%--increased to 8.9% from 7.9%. Cardiorenal risk reduction is opportunities for improvement.. Blood pressure is at goal <130/80. LDL is at goal.  - Reviewed long term cardiovascular and renal outcomes of uncontrolled blood sugar. and Reviewed goal A1c, goal fasting, and goal 2 hour post prandial glucose. Recommended to check glucose using libre 2 for now; will need to switch to [place] 3 plus -Patient not feeling well so she would like to defer medication review until next week.   Follow Up Plan: 2 weeks  Mliss Tarry Griffin, PharmD, BCACP, CPP Clinical Pharmacist, [facility]

## 2024-01-11 ENCOUNTER — Telehealth: Payer: Self-pay | Admitting: Pharmacy Technician

## 2024-01-11 ENCOUNTER — Other Ambulatory Visit (HOSPITAL_COMMUNITY): Payer: Self-pay

## 2024-01-11 NOTE — Telephone Encounter (Signed)
 Pharmacy Patient Advocate Encounter  Received notification from [facility] that Prior Authorization for Tresiba  FlexTouch (insulin  degludec injection) 200 Units/mL solution has been APPROVED from 01/11/24 to 02/28/25   PA #/Case ID/Reference #: [id number]

## 2024-01-11 NOTE — Telephone Encounter (Signed)
 Pharmacy Patient Advocate Encounter   Received notification from Onbase that prior authorization for Tresiba  FlexTouch (insulin  degludec injection) 200 Units/mL solution is required/requested.   Insurance verification completed.   The patient is insured through [facility].   Per test claim: PA required; PA started via CoverMyMeds. KEY [id number] . Waiting for clinical questions to populate.

## 2024-01-11 NOTE — Telephone Encounter (Signed)
 Left detailed message on VM.

## 2024-01-12 ENCOUNTER — Other Ambulatory Visit (HOSPITAL_COMMUNITY): Payer: Self-pay

## 2024-01-12 NOTE — Telephone Encounter (Signed)
 Error duplicate encounter

## 2024-01-19 ENCOUNTER — Other Ambulatory Visit (HOSPITAL_COMMUNITY): Payer: Self-pay

## 2024-01-31 ENCOUNTER — Other Ambulatory Visit

## 2024-02-25 ENCOUNTER — Other Ambulatory Visit: Payer: Self-pay | Admitting: Nurse Practitioner

## 2024-02-27 ENCOUNTER — Other Ambulatory Visit: Payer: Self-pay | Admitting: Nurse Practitioner

## 2024-02-27 DIAGNOSIS — G5603 Carpal tunnel syndrome, bilateral upper limbs: Secondary | ICD-10-CM

## 2024-02-27 NOTE — Telephone Encounter (Signed)
 Last OV 12/19/23. Last RF 08/23/23. Next OV 03/17/2023

## 2024-03-06 ENCOUNTER — Telehealth: Payer: Self-pay | Admitting: Nurse Practitioner

## 2024-03-06 NOTE — Telephone Encounter (Signed)
 Mailed copy of insurance card to pt. Tried to [employer] card. Could not scan insurance card to MyChart.

## 2024-03-16 ENCOUNTER — Ambulatory Visit: Admitting: Nurse Practitioner

## 2024-03-17 ENCOUNTER — Other Ambulatory Visit: Payer: Self-pay | Admitting: Nurse Practitioner

## 2024-03-23 ENCOUNTER — Ambulatory Visit: Admitting: Nurse Practitioner

## 2024-03-30 ENCOUNTER — Ambulatory Visit: Admitting: Nurse Practitioner

## 2024-04-03 ENCOUNTER — Telehealth: Payer: Self-pay

## 2024-04-03 ENCOUNTER — Telehealth: Payer: Self-pay | Admitting: Family Medicine

## 2024-04-03 ENCOUNTER — Other Ambulatory Visit (HOSPITAL_COMMUNITY): Payer: Self-pay

## 2024-04-03 DIAGNOSIS — E1165 Type 2 diabetes mellitus with hyperglycemia: Secondary | ICD-10-CM

## 2024-04-03 MED ORDER — FREESTYLE LIBRE 2 SENSOR MISC: 1.0000 | 1 refills | Status: AC

## 2024-04-03 NOTE — Telephone Encounter (Signed)
 Patient states that Alexandra Hunt normally helps her get these?? Can you help? I am happy to put in a new referral if needed

## 2024-04-03 NOTE — Telephone Encounter (Signed)
 Copied from CRM [id number]. Topic: Clinical - Prescription Issue >> Apr 03, 2024  1:40 PM Delon T wrote: Reason for CRM: Continuous Glucose Sensor (FREESTYLE LIBRE 2 SENSOR)- patient still waiting for the doctor to sign papers to be able to get the sensors- [phone]

## 2024-04-03 NOTE — Telephone Encounter (Signed)
 I resubmitted prescription- they may want you to change to the [place] 3 sensors

## 2024-04-04 ENCOUNTER — Ambulatory Visit: Admitting: Urology

## 2024-04-05 ENCOUNTER — Telehealth: Payer: Self-pay | Admitting: Pharmacist

## 2024-04-05 DIAGNOSIS — E1165 Type 2 diabetes mellitus with hyperglycemia: Secondary | ICD-10-CM

## 2024-04-05 NOTE — Telephone Encounter (Signed)
#  1 libre 3 PLUS CGM Sample given to bridge patient to insurance approval for [employer] 3 PLUS CGM Must do through mail order/DME parachute Appt with PharmD upcoming Will continued to follow  Adena Sima Dattero Heli Dino, PharmD, BCACP, CPP Clinical Pharmacist, [facility]

## 2024-04-05 NOTE — Telephone Encounter (Signed)
 Patient is without CGM.  We will have to go through DME parachute portal to attempt coverage.  Needs appt with Pharmacy.  [id number] placed  Mliss Tarry Griffin, PharmD, BCACP, CPP Clinical Pharmacist, [facility]

## 2024-04-09 ENCOUNTER — Ambulatory Visit

## 2024-04-16 ENCOUNTER — Ambulatory Visit: Admitting: Neurology

## 2024-04-24 ENCOUNTER — Ambulatory Visit: Admitting: Nurse Practitioner

## 2024-10-12 ENCOUNTER — Ambulatory Visit: Admitting: Urology

## 2024-10-17 ENCOUNTER — Ambulatory Visit: Admitting: Urology
